# Patient Record
Sex: Male | Born: 1960 | Race: White | Hispanic: No | Marital: Single | Smoking: Former smoker
Health system: Southern US, Community
[De-identification: ages and names within clinical notes are randomized; demographics above are authoritative.]

## PROBLEM LIST (undated history)

## (undated) DIAGNOSIS — K589 Irritable bowel syndrome without diarrhea: Secondary | ICD-10-CM

## (undated) DIAGNOSIS — K5090 Crohn's disease, unspecified, without complications: Secondary | ICD-10-CM

## (undated) DIAGNOSIS — I10 Essential (primary) hypertension: Secondary | ICD-10-CM

## (undated) DIAGNOSIS — F329 Major depressive disorder, single episode, unspecified: Secondary | ICD-10-CM

## (undated) DIAGNOSIS — R32 Unspecified urinary incontinence: Secondary | ICD-10-CM

## (undated) DIAGNOSIS — F423 Hoarding disorder: Secondary | ICD-10-CM

## (undated) DIAGNOSIS — F32A Depression, unspecified: Secondary | ICD-10-CM

## (undated) DIAGNOSIS — T7840XA Allergy, unspecified, initial encounter: Secondary | ICD-10-CM

## (undated) DIAGNOSIS — F419 Anxiety disorder, unspecified: Secondary | ICD-10-CM

## (undated) DIAGNOSIS — I499 Cardiac arrhythmia, unspecified: Secondary | ICD-10-CM

## (undated) DIAGNOSIS — E785 Hyperlipidemia, unspecified: Secondary | ICD-10-CM

## (undated) DIAGNOSIS — K219 Gastro-esophageal reflux disease without esophagitis: Secondary | ICD-10-CM

## (undated) HISTORY — DX: Hoarding disorder: F42.3

## (undated) HISTORY — DX: Major depressive disorder, single episode, unspecified: F32.9

## (undated) HISTORY — DX: Gastro-esophageal reflux disease without esophagitis: K21.9

## (undated) HISTORY — DX: Irritable bowel syndrome, unspecified: K58.9

## (undated) HISTORY — DX: Anxiety disorder, unspecified: F41.9

## (undated) HISTORY — DX: Unspecified urinary incontinence: R32

## (undated) HISTORY — DX: Depression, unspecified: F32.A

## (undated) HISTORY — PX: POLYPECTOMY: SHX149

## (undated) HISTORY — DX: Crohn's disease, unspecified, without complications: K50.90

## (undated) HISTORY — DX: Essential (primary) hypertension: I10

## (undated) HISTORY — DX: Cardiac arrhythmia, unspecified: I49.9

## (undated) HISTORY — DX: Allergy, unspecified, initial encounter: T78.40XA

## (undated) HISTORY — DX: Hyperlipidemia, unspecified: E78.5

## (undated) HISTORY — PX: APPENDECTOMY: SHX54

## (undated) HISTORY — PX: UMBILICAL HERNIA REPAIR: SHX196

## (undated) HISTORY — PX: COLONOSCOPY: SHX174

---
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
## 1999-09-14 ENCOUNTER — Encounter: Payer: Self-pay | Admitting: Surgery

## 1999-09-14 ENCOUNTER — Ambulatory Visit (HOSPITAL_COMMUNITY): Admission: RE | Admit: 1999-09-14 | Discharge: 1999-09-14 | Payer: Self-pay | Admitting: Surgery

## 1999-09-14 ENCOUNTER — Encounter (INDEPENDENT_AMBULATORY_CARE_PROVIDER_SITE_OTHER): Payer: Self-pay | Admitting: Specialist

## 1999-09-14 ENCOUNTER — Observation Stay: Admission: EM | Admit: 1999-09-14 | Discharge: 1999-09-15 | Payer: Self-pay | Admitting: Emergency Medicine

## 2005-08-07 ENCOUNTER — Ambulatory Visit: Payer: Self-pay | Admitting: Family Medicine

## 2005-08-22 ENCOUNTER — Ambulatory Visit: Payer: Self-pay | Admitting: Family Medicine

## 2005-11-06 ENCOUNTER — Ambulatory Visit: Payer: Self-pay | Admitting: Family Medicine

## 2005-11-16 ENCOUNTER — Ambulatory Visit: Payer: Self-pay | Admitting: Gastroenterology

## 2005-11-26 ENCOUNTER — Ambulatory Visit: Payer: Self-pay | Admitting: Gastroenterology

## 2005-11-26 LAB — HM COLONOSCOPY: HM Colonoscopy: NEGATIVE

## 2006-01-18 ENCOUNTER — Ambulatory Visit: Payer: Self-pay | Admitting: Family Medicine

## 2006-02-13 ENCOUNTER — Ambulatory Visit: Payer: Self-pay | Admitting: Family Medicine

## 2006-03-04 ENCOUNTER — Ambulatory Visit: Payer: Self-pay | Admitting: Family Medicine

## 2006-03-29 ENCOUNTER — Ambulatory Visit: Payer: Self-pay | Admitting: Family Medicine

## 2006-05-22 ENCOUNTER — Ambulatory Visit: Payer: Self-pay | Admitting: Family Medicine

## 2006-06-25 ENCOUNTER — Ambulatory Visit: Payer: Self-pay | Admitting: Family Medicine

## 2006-07-01 ENCOUNTER — Ambulatory Visit: Payer: Self-pay | Admitting: Family Medicine

## 2006-07-31 ENCOUNTER — Ambulatory Visit: Payer: Self-pay | Admitting: Family Medicine

## 2006-08-13 ENCOUNTER — Ambulatory Visit: Payer: Self-pay | Admitting: Family Medicine

## 2006-10-15 ENCOUNTER — Ambulatory Visit: Payer: Self-pay | Admitting: Family Medicine

## 2006-12-03 ENCOUNTER — Ambulatory Visit: Payer: Self-pay | Admitting: Family Medicine

## 2006-12-18 ENCOUNTER — Ambulatory Visit: Payer: Self-pay | Admitting: Family Medicine

## 2007-06-24 ENCOUNTER — Telehealth: Payer: Self-pay | Admitting: Gastroenterology

## 2007-06-24 ENCOUNTER — Ambulatory Visit: Payer: Self-pay | Admitting: Family Medicine

## 2007-08-11 ENCOUNTER — Ambulatory Visit: Payer: Self-pay | Admitting: Gastroenterology

## 2007-08-25 ENCOUNTER — Ambulatory Visit: Payer: Self-pay | Admitting: Gastroenterology

## 2007-08-26 ENCOUNTER — Encounter: Payer: Self-pay | Admitting: Gastroenterology

## 2007-09-22 DIAGNOSIS — Z860100 Personal history of colon polyps, unspecified: Secondary | ICD-10-CM | POA: Insufficient documentation

## 2007-09-22 DIAGNOSIS — F411 Generalized anxiety disorder: Secondary | ICD-10-CM | POA: Insufficient documentation

## 2007-09-22 DIAGNOSIS — F329 Major depressive disorder, single episode, unspecified: Secondary | ICD-10-CM | POA: Insufficient documentation

## 2007-09-22 DIAGNOSIS — Z8719 Personal history of other diseases of the digestive system: Secondary | ICD-10-CM | POA: Insufficient documentation

## 2007-09-22 DIAGNOSIS — Z8659 Personal history of other mental and behavioral disorders: Secondary | ICD-10-CM | POA: Insufficient documentation

## 2007-09-22 DIAGNOSIS — K5080 Crohn's disease of both small and large intestine without complications: Secondary | ICD-10-CM | POA: Insufficient documentation

## 2007-09-23 ENCOUNTER — Ambulatory Visit: Payer: Self-pay | Admitting: Gastroenterology

## 2007-09-23 ENCOUNTER — Telehealth: Payer: Self-pay | Admitting: Gastroenterology

## 2007-09-23 DIAGNOSIS — R141 Gas pain: Secondary | ICD-10-CM | POA: Insufficient documentation

## 2007-09-23 DIAGNOSIS — R142 Eructation: Secondary | ICD-10-CM

## 2007-09-23 DIAGNOSIS — R143 Flatulence: Secondary | ICD-10-CM

## 2007-10-01 ENCOUNTER — Ambulatory Visit: Payer: Self-pay | Admitting: Family Medicine

## 2007-10-28 ENCOUNTER — Ambulatory Visit: Payer: Self-pay | Admitting: Gastroenterology

## 2007-10-28 LAB — CONVERTED CEMR LAB: Tissue Transglutaminase Ab, IgA: 0.4 units (ref <7)

## 2007-11-25 ENCOUNTER — Ambulatory Visit: Payer: Self-pay | Admitting: Gastroenterology

## 2007-12-02 ENCOUNTER — Telehealth: Payer: Self-pay | Admitting: Gastroenterology

## 2007-12-02 ENCOUNTER — Ambulatory Visit: Payer: Self-pay | Admitting: Cardiology

## 2007-12-09 ENCOUNTER — Ambulatory Visit: Payer: Self-pay | Admitting: Family Medicine

## 2007-12-15 DIAGNOSIS — K589 Irritable bowel syndrome without diarrhea: Secondary | ICD-10-CM | POA: Insufficient documentation

## 2008-01-21 ENCOUNTER — Telehealth: Payer: Self-pay | Admitting: Gastroenterology

## 2008-02-09 ENCOUNTER — Encounter: Payer: Self-pay | Admitting: Gastroenterology

## 2008-02-27 ENCOUNTER — Ambulatory Visit: Payer: Self-pay | Admitting: Family Medicine

## 2008-04-19 ENCOUNTER — Ambulatory Visit: Payer: Self-pay | Admitting: Family Medicine

## 2008-10-25 ENCOUNTER — Ambulatory Visit: Payer: Self-pay | Admitting: Family Medicine

## 2008-11-23 ENCOUNTER — Ambulatory Visit: Payer: Self-pay | Admitting: Family Medicine

## 2009-01-05 ENCOUNTER — Ambulatory Visit: Payer: Self-pay | Admitting: Family Medicine

## 2009-04-06 ENCOUNTER — Ambulatory Visit: Payer: Self-pay | Admitting: Family Medicine

## 2009-05-04 ENCOUNTER — Ambulatory Visit: Payer: Self-pay | Admitting: Family Medicine

## 2009-09-21 ENCOUNTER — Encounter: Admission: RE | Admit: 2009-09-21 | Discharge: 2009-09-21 | Payer: Self-pay | Admitting: Family Medicine

## 2009-09-21 ENCOUNTER — Ambulatory Visit: Payer: Self-pay | Admitting: Family Medicine

## 2009-09-22 ENCOUNTER — Emergency Department (HOSPITAL_COMMUNITY): Admission: EM | Admit: 2009-09-22 | Discharge: 2009-09-22 | Payer: Self-pay | Admitting: Emergency Medicine

## 2009-09-28 ENCOUNTER — Telehealth (INDEPENDENT_AMBULATORY_CARE_PROVIDER_SITE_OTHER): Payer: Self-pay | Admitting: *Deleted

## 2009-09-29 ENCOUNTER — Encounter: Payer: Self-pay | Admitting: Cardiology

## 2009-09-29 ENCOUNTER — Encounter (HOSPITAL_COMMUNITY): Admission: RE | Admit: 2009-09-29 | Discharge: 2009-10-07 | Payer: Self-pay | Admitting: Family Medicine

## 2009-09-29 ENCOUNTER — Ambulatory Visit: Payer: Self-pay

## 2009-09-29 ENCOUNTER — Encounter: Payer: Self-pay | Admitting: Internal Medicine

## 2009-09-29 ENCOUNTER — Ambulatory Visit: Payer: Self-pay | Admitting: Cardiology

## 2009-10-12 ENCOUNTER — Ambulatory Visit: Payer: Self-pay | Admitting: Family Medicine

## 2010-01-09 ENCOUNTER — Ambulatory Visit
Admission: RE | Admit: 2010-01-09 | Discharge: 2010-01-09 | Payer: Self-pay | Source: Home / Self Care | Attending: Family Medicine | Admitting: Family Medicine

## 2010-01-29 LAB — CONVERTED CEMR LAB
ALT: 30 units/L (ref 0–53)
AST: 25 units/L (ref 0–37)
Albumin: 4.2 g/dL (ref 3.5–5.2)
Alkaline Phosphatase: 52 units/L (ref 39–117)
BUN: 11 mg/dL (ref 6–23)
Basophils Absolute: 0.1 10*3/uL (ref 0.0–0.1)
Basophils Relative: 1.2 % (ref 0.0–3.0)
Bilirubin, Direct: 0.2 mg/dL (ref 0.0–0.3)
CO2: 33 meq/L — ABNORMAL HIGH (ref 19–32)
CRP, High Sensitivity: <1 — ABNORMAL LOW (ref 0.00–5.00)
Calcium: 9.1 mg/dL (ref 8.4–10.5)
Chloride: 104 meq/L (ref 96–112)
Creatinine, Ser: 1.1 mg/dL (ref 0.4–1.5)
Eosinophils Absolute: 0.1 10*3/uL (ref 0.0–0.7)
Eosinophils Relative: 1.1 % (ref 0.0–5.0)
Ferritin: 77.7 ng/mL (ref 22.0–322.0)
Folate: 19.6 ng/mL
GFR calc Af Amer: 93 mL/min
GFR calc non Af Amer: 77 mL/min
Glucose, Bld: 94 mg/dL (ref 70–99)
HCT: 46 % (ref 39.0–52.0)
Hemoglobin: 15.7 g/dL (ref 13.0–17.0)
Iron: 127 ug/dL (ref 42–165)
Lymphocytes Relative: 21.6 % (ref 12.0–46.0)
MCHC: 34.1 g/dL (ref 30.0–36.0)
MCV: 93.8 fL (ref 78.0–100.0)
Monocytes Absolute: 0.6 10*3/uL (ref 0.1–1.0)
Monocytes Relative: 6.5 % (ref 3.0–12.0)
Neutro Abs: 6.6 10*3/uL (ref 1.4–7.7)
Neutrophils Relative %: 69.6 % (ref 43.0–77.0)
Platelets: 341 10*3/uL (ref 150–400)
Potassium: 4.7 meq/L (ref 3.5–5.1)
RBC: 4.9 M/uL (ref 4.22–5.81)
RDW: 12.8 % (ref 11.5–14.6)
Saturation Ratios: 34.2 % (ref 20.0–50.0)
Sed Rate: 5 mm/hr (ref 0–16)
Sodium: 140 meq/L (ref 135–145)
TSH: 4.49 microintl units/mL (ref 0.35–5.50)
Total Bilirubin: 0.9 mg/dL (ref 0.3–1.2)
Total Protein: 7.3 g/dL (ref 6.0–8.3)
Transferrin: 265 mg/dL (ref >=212.0)
Vitamin B-12: 533 pg/mL (ref 211–911)
WBC: 9.4 10*3/uL (ref 4.5–10.5)

## 2010-01-31 NOTE — Progress Notes (Signed)
Summary: Nuclear pre procedure  Phone Note Outgoing Call Call back at [facility] Phone [phone]   Call placed by: Valetta Fuller, CMA,  September 28, 2009 5:02 PM Call placed to: Patient Summary of Call: Reviewed information on [employer] Information Sheet (see scanned document for further details).  Zachary [name] spoke with patient.      Nuclear Med Background Indications for Stress Test: Evaluation for Ischemia, Abnormal EKG     Symptoms: Chest Pressure, SOB    Nuclear Pre-Procedure Height (in): 75

## 2010-01-31 NOTE — Assessment & Plan Note (Signed)
Summary: Cardiology Nuclear Testing  Nuclear Med Background Indications for Stress Test: Evaluation for Ischemia, Abnormal EKG     Symptoms: Chest Pressure, SOB    Nuclear Pre-Procedure Caffeine/Decaff Intake: None NPO After: 7:30 PM Lungs: clear IV 0.9% NS with Angio Cath: 22g     IV Site: R Antecubital IV Started by: Crissie Figures, RN Chest Size (in) 34     Height (in): 75 Weight (lb): 234 BMI: 29.35  Nuclear Med Study 1 or 2 day study:  1 day     Stress Test Type:  Stress Reading MD:  Kirk Ruths, MD     Referring MD:  J.LaLonde Resting Radionuclide:  Technetium 81mTetrofosmin     Resting Radionuclide Dose:  11 mCi  Stress Radionuclide:  Technetium 955metrofosmin     Stress Radionuclide Dose:  33 mCi   Stress Protocol Exercise Time (min):  8:00 min     Max HR:  155 bpm     Predicted Max HR:  173 bpm       Percent Max HR:  89.60 %     METS: 10.4    Stress Test Technologist:  SaPerrin MalteseEMT-P     Nuclear Technologist:  StCharlton AmorCNMT  Rest Procedure  Myocardial perfusion imaging was performed at rest 45 minutes following the intravenous administration of Technetium 9976mtrofosmin.  Stress Procedure  The patient exercised for 8:00. The patient stopped due to fatigue and denied any chest pain.  There were no significant ST-T wave changes.  Technetium 6m19mrofosmin was injected at peak exercise and myocardial perfusion imaging was performed after a brief delay.  QPS Raw Data Images:  Acquisition technically good; normal left ventricular size. Stress Images:  Normal homogeneous uptake in all areas of the myocardium. Rest Images:  Normal homogeneous uptake in all areas of the myocardium. Subtraction (SDS):  No evidence of ischemia. Transient Ischemic Dilatation:  1.03  (Normal <1.22)  Lung/Heart Ratio:  .35  (Normal <0.45)  Quantitative Gated Spect Images QGS EDV:  114 ml QGS ESV:  44 ml QGS EF:  62 % QGS cine images:  Normal wall  motion.   Overall Impression  Exercise Capacity: Good exercise capacity. BP Response: Normal blood pressure response. Clinical Symptoms: No chest pain ECG Impression: No significant ST segment change suggestive of ischemia. Overall Impression: Normal stress nuclear study with no ischemia or infarction.

## 2010-03-16 LAB — DIFFERENTIAL
Basophils Absolute: 0 10*3/uL (ref 0.0–0.1)
Eosinophils Relative: 4 % (ref 0–5)
Lymphocytes Relative: 21 % (ref 12–46)
Lymphs Abs: 1.9 10*3/uL (ref 0.7–4.0)
Neutro Abs: 5.9 10*3/uL (ref 1.7–7.7)
Neutrophils Relative %: 67 % (ref 43–77)

## 2010-03-16 LAB — CBC
MCV: 91.7 fL (ref 78.0–100.0)
Platelets: 307 10*3/uL (ref 150–400)
RBC: 4.83 MIL/uL (ref 4.22–5.81)
RDW: 12.8 % (ref 11.5–15.5)
WBC: 8.8 10*3/uL (ref 4.0–10.5)

## 2010-03-16 LAB — BASIC METABOLIC PANEL
Chloride: 105 mEq/L (ref 96–112)
Creatinine, Ser: 1.08 mg/dL (ref 0.4–1.5)
GFR calc Af Amer: >60 mL/min (ref >60)
GFR calc non Af Amer: >60 mL/min (ref >60)

## 2010-03-16 LAB — POCT CARDIAC MARKERS
CKMB, poc: 1.6 ng/mL (ref 1.0–8.0)
Troponin i, poc: <0.05 ng/mL (ref 0.00–0.09)

## 2010-04-25 ENCOUNTER — Encounter (INDEPENDENT_AMBULATORY_CARE_PROVIDER_SITE_OTHER): Payer: 59 | Admitting: Family Medicine

## 2010-04-25 DIAGNOSIS — F329 Major depressive disorder, single episode, unspecified: Secondary | ICD-10-CM

## 2010-04-25 DIAGNOSIS — F3289 Other specified depressive episodes: Secondary | ICD-10-CM

## 2010-04-25 DIAGNOSIS — K319 Disease of stomach and duodenum, unspecified: Secondary | ICD-10-CM

## 2010-04-25 DIAGNOSIS — Z0000 Encounter for general adult medical examination without abnormal findings: Secondary | ICD-10-CM

## 2010-04-25 DIAGNOSIS — J309 Allergic rhinitis, unspecified: Secondary | ICD-10-CM

## 2010-05-19 NOTE — Op Note (Signed)
[facility]  Patient:    CASPER, PAGLIUCA                   MRN: [id number] Proc. Date: 09/14/99 Adm. Date:  [id number] Disc. Date: [id number] Attending:  Coralie Keens A                           Operative Report  PREOPERATIVE DIAGNOSES:  Acute appendicitis.  POSTOPERATIVE DIAGNOSES:  Acute appendicitis.  PROCEDURE:  Laparoscopic appendectomy.  SURGEON:  Coralie Keens, M.D.  ANESTHESIA:  General endotracheal anesthesia and 0.25% Marcaine plain.  ESTIMATED BLOOD LOSS:  Minimal.  DESCRIPTION OF PROCEDURE:  The patient was brought to the operating room and identified as Zachery Conch. He was placed supine on the operating table where general anesthesia was induced. His abdomen was then prepped and draped in the usual sterile fashion. Using a #15 blade, a small transverse incision was made just below the umbilicus. The incision was carried down to the fascia. The fascia was then opened with a scalpel. A hemostat was then used to pass into the peritoneal cavity. Next, a #0 Vicryl pursestring suture was placed around the fascial opening. The Hasson port was placed through the opening and insufflation of the abdomen was begun.  Next, a 12 mm port was placed in the patients low midline. This was done under direct vision. The appendix was then identified and found to be mildly inflamed. Next, a separate 5 mm port was placed in the patients right upper quadrant. The appendix was then grasped and elevated. The mesoappendix was then dissected away from the appendix. The endovascular stapler was then used to take down the mesoappendix in 2 separate bites. The base of the appendix was then identified and the EndoGIA was used to transect this as well. The appendix was then removed through the umbilical incision. The port was then replaced. A small bleeding area on the staple line was controlled with regular endoscopic staples. The area was then  irrigated. Hemostasis appeared to be achieved. At this point, all ports were removed under direct vision and the abdomen was deflated. A #0 Vicryl pursestring suture at the umbilicus was then fastened in placed. The separate port site in the lower midline was also closed with a #0 Vicryl figure-of-eight suture as well. All skin incisions were then anesthetized with 0.25% Marcaine and closed with 4-0 monocryl sutures. Steri-Strips, gauze and Tegaderm were then applied. The patient tolerated the procedure well. All sponge, needle and instrument counts were correct at the end of the procedure. The patient was then extubated in the operating room and taken in stable condition to the recovery room. DD:  09/14/99 TD:  09/16/99 Job: [id number]

## 2010-05-19 NOTE — Assessment & Plan Note (Signed)
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 Redmond School for evaluation of guaiac positive stool  determined on digital rectal exam.   Zachary [name] has had some hemorrhoids, occasional bright red blood with  wiping.  He denies abdominal pain otherwise or irregular bowel habits.  He  does have rather marked increased flatulence.  He denies upper GI or  hepatobiliary complaints.  He apparently was seen by myself many years ago  when he was a teenager and he was told he had irritable bowel syndrome.  These records are not available for review.  He certainly has had no  anorexia, weight loss, growth disturbance, or systemic complaints such as  fevers, chills, skin rash, joint pains, or oral stomatitis.  His appetite is  good and his weight is stable.   He recently saw Dr. Redmond School and had a positive stool Hemoccult.  Lab data  showed a normal hemoglobin of 16.4 and a normal metabolic profile except for  borderline low blood glucose of 68 mg percent.  Lipid panel was normal.   The patient apparently has never had barium studies and it is unclear  whether or not he had a colonoscopy many years ago.   PAST MEDICAL HISTORY:  Remarkable for anxiety and depression in his teenage  years, but not currently.   FAMILY HISTORY:  Remarkable for prostate cancer in his father and breast  cancer in his maternal grandmother.  There are no known gastrointestinal  problems in his family.   The patient does not smoke or abuse ethanol.   REVIEW OF SYSTEMS:  Entirely noncontributory.  He apparently did have viral  hepatitis in 1981, has had no known chronic liver disease, denies  hepatobiliary complaints.  He also  denies current cardiovascular, pulmonary,  or neurological or psychiatric difficulties at this time.   He is a healthy-appearing white male, appearing his stated age in no acute  distress.  He is 6 feet 3 inches tall and weighs 203 pounds.  Blood pressure is 128/90  and pulse was 72 and regular.  I could not appreciate stigmata of chronic liver disease.  His chest was entirely clear to percussion and auscultation.  There were no murmurs, gallops or rubs noted.  He appeared to be in a  regular rhythm.  I could not appreciate hepatosplenomegaly, abdominal masses or tenderness.  Peripheral extremities were unremarkable.  RECTAL:  Exam was deferred at this time.   ASSESSMENT:  Zachary [name] has a positive hemoccult card without anemia and I  suspect he has an underlying colonic polyp.  Certainly nothing in his exam  or history to suggest chronic inflammatory bowel disease.   RECOMMENDATIONS:  I have gone ahead and set Zachary [name] up for colonoscopy  at his convenience.  The risk and benefits of this procedure have been  explained in  detail, he agreed to proceed as planned.     Loralee Pacas. Sharlett Iles, MD, [facility] Ore, [place]  Electronically Signed    DRP/MedQ  DD: 11/16/2005  DT: 11/16/2005  Job #: [id number]   cc:   Jill Alexanders, M.D.

## 2010-06-26 ENCOUNTER — Encounter: Payer: Self-pay | Admitting: Family Medicine

## 2010-06-26 DIAGNOSIS — Z8619 Personal history of other infectious and parasitic diseases: Secondary | ICD-10-CM | POA: Insufficient documentation

## 2010-06-26 DIAGNOSIS — J302 Other seasonal allergic rhinitis: Secondary | ICD-10-CM

## 2010-07-04 ENCOUNTER — Encounter: Payer: Self-pay | Admitting: Medical

## 2010-07-04 ENCOUNTER — Ambulatory Visit (INDEPENDENT_AMBULATORY_CARE_PROVIDER_SITE_OTHER): Payer: 59 | Admitting: Medical

## 2010-07-04 VITALS — BP 114/82 | HR 75 | Temp 97.7°F | Ht 75.0 in | Wt 241.0 lb

## 2010-07-04 DIAGNOSIS — R6882 Decreased libido: Secondary | ICD-10-CM

## 2010-07-04 DIAGNOSIS — R5383 Other fatigue: Secondary | ICD-10-CM

## 2010-07-04 DIAGNOSIS — M25572 Pain in left ankle and joints of left foot: Secondary | ICD-10-CM

## 2010-07-04 DIAGNOSIS — M25579 Pain in unspecified ankle and joints of unspecified foot: Secondary | ICD-10-CM

## 2010-07-04 NOTE — Progress Notes (Signed)
  Subjective:   HPI Here today for mult c/o.    He has concerns about left ankle and foot problems.  Currently gets ankle pain that radiates to the rest of his foot.  At times the pain radiates to calve.  Worsened by being on feet all day.  Denies problems with the right foot.  Relieved by nothing, "it just goes away."   Pain usually lasts different amounts of time.  Denies swelling, redness, or warmth.  Uses nothing to relieve the pain other than rest.  Pain is dull and constant.  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   Denies exercise other than through work.    He also has concerns about having his testosterone checked. He notes for months now he states fatigue, has no energy, and has no sex drive. He currently is single. He was just here for his physical in April 2012.  No other aggravating or relieving factors.  No other c/o.  The following portions of the patient's history were reviewed and updated as appropriate: allergies, current medications, past family history, past medical history, past social history, past surgical history and problem list.  Past Medical History  Diagnosis Date  . Allergy   . Depression   . Crohn disease   . Herpes simplex     Review of Systems Gen.: No fever, chills, weight changes Skin: No rash, redness, bruising Cardiac: No chest pain, palpitations, edema Lungs: No shortness of breath, wheezing, dyspnea on exertion GU: No lesions, masses, discharge, pain, dysuria, urinary stream changes Neuro: No numbness, tingling, weakness, sleep changes Psych: No worsening depression or anxiety     Objective:   Physical Exam  General appearance: alert, no distress, WD/WN, white male, overweight Musculoskeletal: Left medial ankle with generalized tenderness along the deltoid ligament, mild pain on the medial ankle when he walks, otherwise malleoli are nontender, no swelling bilaterally, range of motion normal, and rest and lower extremities non-tender, no  deformity, normal range of motion.  When he ambulates his arch flattens bilaterally. Pulses: 2+, normal cap refill Extremities: No edema Neuro: Normal strength and sensation of lower extremities and feet    Assessment :    Encounter Diagnoses  Name Primary?  Marland Kitchen Ankle pain, left Yes  . Libido, decreased   . Fatigue      Plan:    Ankle pain-after watching him walk and looking in his shoes, his arch flattens out completely with walking, he has some over pronation, and issues are certainly wore out. 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 I recommend he go and have a shoe store fit him for a proper supportive stability shoe.  Consider [employer] or [employer] for their expertise here locally.  If this problem continues beyond a month, we can consider an x-ray or referral.  Libido, fatigue - we will check a testosterone today. We discussed possible therapies if testosterone is low. We also discussed prostate cancer screening.

## 2010-07-06 ENCOUNTER — Telehealth: Payer: Self-pay | Admitting: *Deleted

## 2010-07-06 NOTE — Telephone Encounter (Addendum)
Message copied by Gracy Racer on Thu Jul 06, 2010 11:29 AM ------      Message from: Glade Lloyd, DAVID S      Created: Thu Jul 06, 2010  7:39 AM       His testosterone is within normal range.  In general I recommend he drink plenty of water daily, eat low fat, healthy diet with plenty of fruits, berries, vegetables, and less fatty/fast food items.  Also, ask him to take a daily multivitamin, and try a B complex vitamin over the counter that contains B12 and other B vitamins to help with energy.  Try and lose some weight, as weight loss usually improves sex drive and over all healthy feeling.   Pt notified of lab results. Advised pt to drink plenty of fluids, eat healthy with low fat and to take multivitamin with B complex.  CM, LPN

## 2010-08-17 ENCOUNTER — Encounter: Payer: Self-pay | Admitting: Family Medicine

## 2010-08-17 ENCOUNTER — Ambulatory Visit (INDEPENDENT_AMBULATORY_CARE_PROVIDER_SITE_OTHER): Payer: 59 | Admitting: Family Medicine

## 2010-08-17 VITALS — BP 110/80 | HR 84 | Temp 98.2°F | Wt 240.0 lb

## 2010-08-17 DIAGNOSIS — K589 Irritable bowel syndrome without diarrhea: Secondary | ICD-10-CM

## 2010-08-17 DIAGNOSIS — J029 Acute pharyngitis, unspecified: Secondary | ICD-10-CM

## 2010-08-17 NOTE — Patient Instructions (Signed)
Use a bulk laxative of your choice for the next several weeks and then call me

## 2010-08-17 NOTE — Progress Notes (Signed)
  Subjective:    Patient ID: Zachary [name], male    DOB: [birth year], 51 y.o.   MRN: [id number]  HPI Over the last several months he has noted difficulty with urgency to have a BM. If he does not get to the bathroom within several minutes, he will soil himself. He has a previous history of difficulty with flatulence. He has been seen by GI here and in [facility]. No change in eating habits. Her meds were readjusted and he is now on 3 Luvox per day per however this occurred after the GI symptoms. His stool pattern can be from hard stool to loose and watery in one day. He has not discussed these symptoms with anybody prior to today He also complains of a five-day history of sore throat and earache but no fever, chills, cough or congestion. He is starting to feel better. Review of Systems     Objective:   Physical Exam alert and in no distress. Tympanic membranes and canals are normal. Throat is clear. Tonsils are normal. Neck is supple without adenopathy or thyromegaly. Cardiac exam shows a regular sinus rhythm without murmurs or gallops. Lungs are clear to auscultation., Exam shows decreased bowel sounds without masses or tenderness        Assessment & Plan:  IBS. Sore throat Conservative care for the sore throat. Recommend bulk laxative of choice regularly for the next 10 days and call me.

## 2010-11-09 ENCOUNTER — Encounter: Payer: Self-pay | Admitting: Family Medicine

## 2010-11-09 ENCOUNTER — Ambulatory Visit (INDEPENDENT_AMBULATORY_CARE_PROVIDER_SITE_OTHER): Payer: 59 | Admitting: Family Medicine

## 2010-11-09 VITALS — BP 130/86 | HR 70 | Wt 240.0 lb

## 2010-11-09 DIAGNOSIS — Z719 Counseling, unspecified: Secondary | ICD-10-CM

## 2010-11-09 DIAGNOSIS — IMO0001 Reserved for inherently not codable concepts without codable children: Secondary | ICD-10-CM

## 2010-11-09 DIAGNOSIS — D229 Melanocytic nevi, unspecified: Secondary | ICD-10-CM

## 2010-11-09 DIAGNOSIS — D239 Other benign neoplasm of skin, unspecified: Secondary | ICD-10-CM

## 2010-11-09 NOTE — Progress Notes (Signed)
  Subjective:    Patient ID: Zachary [name], male    DOB: 04-14-1960, 50 y.o.   MRN: [id number]  HPI He is here to discuss a lesion present in the left torso. It has diminished in size from yesterday when he made the appointment. He also has concerns over weight gain. He has cut back on his calorie consumption but admits to not being as physically active as he has been in the past.   Review of Systems     Objective:   Physical Exam Alert and in no distress. Exam of the left torso shows a healing lesion that was probably a mole or small hair follicle.       Assessment & Plan:   1. Mole (skin)   2. Encounter for consultation    I discussed weight loss with him in regard to his eating habits and his physical activities. Encouraged him to become more physically active. He stopped going to gym do to what he considers his excessive flatulence and social ramifications of that. Encouraged him to become more physically active on his own.

## 2011-02-28 ENCOUNTER — Encounter: Payer: Self-pay | Admitting: Gastroenterology

## 2011-03-16 ENCOUNTER — Encounter: Payer: Self-pay | Admitting: Family Medicine

## 2011-03-16 ENCOUNTER — Ambulatory Visit (INDEPENDENT_AMBULATORY_CARE_PROVIDER_SITE_OTHER): Payer: 59 | Admitting: Family Medicine

## 2011-03-16 VITALS — BP 130/80 | HR 64 | Wt 239.0 lb

## 2011-03-16 DIAGNOSIS — K219 Gastro-esophageal reflux disease without esophagitis: Secondary | ICD-10-CM

## 2011-03-16 DIAGNOSIS — K589 Irritable bowel syndrome without diarrhea: Secondary | ICD-10-CM

## 2011-03-16 MED ORDER — HYOSCYAMINE SULFATE 0.125 MG SL SUBL: 0.1250 mg | SUBLINGUAL_TABLET | SUBLINGUAL | Status: AC | PRN

## 2011-03-16 MED ORDER — ESOMEPRAZOLE MAGNESIUM 40 MG PO CPDR: 40.0000 mg | DELAYED_RELEASE_CAPSULE | Freq: Every day | ORAL | Status: DC

## 2011-03-16 NOTE — Patient Instructions (Signed)
Diet for GERD or PUD Nutrition therapy can help ease the discomfort of gastroesophageal reflux disease (GERD) and peptic ulcer disease (PUD).  HOME CARE INSTRUCTIONS   Eat your meals slowly, in a relaxed setting.   Eat 5 to 6 small meals per day.   If a food causes distress, stop eating it for a period of time.  FOODS TO AVOID  Coffee, regular or decaffeinated.   Cola beverages, regular or low calorie.   Tea, regular or decaffeinated.   Pepper.   Cocoa.   High fat foods, including meats.   Butter, margarine, hydrogenated oil (trans fats).   Peppermint or spearmint (if you have GERD).   Fruits and vegetables if not tolerated.   Alcohol.   Nicotine (smoking or chewing). This is one of the most potent stimulants to acid production in the gastrointestinal tract.   Any food that seems to aggravate your condition.  If you have questions regarding your diet, ask your caregiver or a registered dietitian. TIPS  Lying flat may make symptoms worse. Keep the head of your bed raised 6 to 9 inches (15 to 23 cm) by using a foam wedge or blocks under the legs of the bed.   Do not lay down until 3 hours after eating a meal.   Daily physical activity may help reduce symptoms.  MAKE SURE YOU:   Understand these instructions.   Will watch your condition.   Will get help right away if you are not doing well or get worse.  Document Released: 12/18/2004 Document Revised: 12/07/2010 Document Reviewed: 11/03/2010 [facility] Patient Information 2012 [place].   LLQ pain and lower abdominal pain--likely contributed by constipation and gas, and some component of pain is related to irritable bowel syndrome.  Bland foods, avoid dairy.  Trial of Simethicone (gas-X).  High fiber diet and drink at least 8 glasses of water daily to help keep bowels moving regularly.  Trial of anti-spasmodic, to use when having acute onset of abdominal pain.  Reflux-- cut back on tomatoes and acidic  foods, as well as chocolate.  Trial of Nexium regularly for a few weeks, then can try stopping.  If recurrent reflux symptoms, can use OTC Zantac, Pepcid or Prilosec as needed.

## 2011-03-16 NOTE — Progress Notes (Signed)
Patient presents with complaint of "gas pain".  Problems began about 2 months ago.  Pain is at LLQ, and suprapubic, comes and goes, not related to meals.  Doesn't recall that passing gas or having bowel movements eases the pain at all.  Gets the pain about 3-4 days/week, and he "just lives it out".  Pain lasts for 1-2 hours, then self resolves.  Hasn't tried anything for the pain.  Bowels are unchanged--has constipation and diarrhea both, sometimes in the same day.  Moves his bowels daily.  He has a h/o IBS, has never taken meds for it.  He reportedly was told that he does NOT have Crohns disease.  Last colonoscopy was about 2 years ago.  Apparently he was told he didn't have Crohn's after a blood test, a few months after the colonoscopy.    Also having heartburn for the last 2 months.  Denies any dietary changes.  Drinks a lot of Ensure, chocolate.  Drinks a lot of ginger ale.  Denies any caffeine. +tomato sauces.  Heartburn seems to occur mostly at work, and he thinks is stress-related.  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  He feels the acid rising up in his throat.  Denies significant belching, nausea or vomiting (rare nausea).  Hasn't taken any meds for it.  Doesn't recall if symptoms occur after eating, symptoms last less than an hour.  Denies dysphagia or chest pain.  Past Medical History  Diagnosis Date  . Allergy   . Depression   . Crohn disease   . Herpes simplex     Past Surgical History  Procedure Date  . Twisted testicles 1970    [place]     History   Social History  . Marital Status: Single    Spouse Name: N/A    Number of Children: N/A  . Years of Education: N/A   Occupational History  . Not on file.   Social History Main Topics  . Smoking status: Never Smoker   . Smokeless tobacco: Never Used  . Alcohol Use: No  . Drug Use: No  . Sexually Active: Not on file   Other Topics Concern  . Not on file   Social History Narrative  . No narrative on file    Family History    Problem Relation Age of Onset  . Arthritis Mother   . Hypertension Father   . Cancer Father 12    prostate cancer  . Cancer Maternal Aunt   . Cancer Maternal Uncle   . Cancer Maternal Grandmother   . Cancer Maternal Grandfather   . Cancer Paternal Grandmother   . Cancer Paternal Uncle     prostate cancer   Current Outpatient Prescriptions on File Prior to Visit  Medication Sig Dispense Refill  . lamoTRIgine (LAMICTAL) 200 MG tablet Take 200 mg by mouth 2 (two) times daily.       . methylphenidate (RITALIN) 20 MG tablet Take 20 mg by mouth 3 (three) times daily.       . Furosemide (LASIX PO) Take by mouth.         Allergies  Allergen Reactions  . Penicillins     REACTION: "it may kill me"  . Tetracyclines & Related    ROS:  Denies fevers, URI symptoms, cough, shortness of breath, chest pain, palpitations, skin rash, headaches, dizziness or other concerns.  See HPI  PHYSICAL EXAM: BP 130/80  Pulse 64  Wt 239 lb (108.41 kg) Well developed, mildly anxious-appearing male, in no distress  HEENT:  PERRL, EOMI, conjunctiva clear.  OP clear Neck: no lymphadenopathy, thyromegaly or mass Heart: regular rate and rhythm without murmur Lungs: clear bilaterally Abdomen: soft, nondistended.  Active bowel sounds.  Mild epigastric tenderness.  Diffuse mild tenderness across lower abdomen, more focal at LLQ.  Some firm linear thickening/mass felt in LLQ, tender on palpation, consistent with probable stool.  No rebound tenderness or guarding. Skin: no rash Neuro: alert and oriented.  Cranial nerves grossly intact. Normal gait  ASSESSMENT/PLAN: 1. IBS (irritable bowel syndrome)  hyoscyamine (LEVSIN SL) 0.125 MG SL tablet  2. GERD (gastroesophageal reflux disease)  esomeprazole (NEXIUM) 40 MG capsule   LLQ pain and lower abdominal pain--likely contributed by constipation and gas, and some component of pain is related to irritable bowel syndrome.  Bland foods, avoid dairy.  Trial of  Simethicone (gas-X).  High fiber diet and drink at least 8 glasses of water daily to help keep bowels moving regularly.  Trial of anti-spasmodic, to use when having acute onset of abdominal pain.  Reflux-- cut back on tomatoes and acidic foods, as well as chocolate.  Trial of Nexium regularly for a few weeks, then can try stopping.  If recurrent reflux symptoms, can use OTC Zantac, Pepcid or Prilosec as needed.   F/u 2 weeks if not resolved.  30 minute face to face visit, more than 1/2 spent in counseling

## 2011-04-06 ENCOUNTER — Ambulatory Visit (INDEPENDENT_AMBULATORY_CARE_PROVIDER_SITE_OTHER): Payer: 59 | Admitting: Medical

## 2011-04-06 ENCOUNTER — Encounter: Payer: Self-pay | Admitting: Medical

## 2011-04-06 DIAGNOSIS — IMO0002 Reserved for concepts with insufficient information to code with codable children: Secondary | ICD-10-CM

## 2011-04-06 DIAGNOSIS — S76319A Strain of muscle, fascia and tendon of the posterior muscle group at thigh level, unspecified thigh, initial encounter: Secondary | ICD-10-CM

## 2011-04-06 DIAGNOSIS — J309 Allergic rhinitis, unspecified: Secondary | ICD-10-CM

## 2011-04-06 NOTE — Patient Instructions (Signed)
Begin Zyrtec 10 mg OTC at bedtime daily for allergies  Begin Naprelan 761m, 1 daily for 2 days, along with heat, rest,   Then Naprelan 5074m 1 daily for 2 days, then call back Monday.

## 2011-04-06 NOTE — Progress Notes (Signed)
Subjective: Here with 2 c/o.  He notes few day hx/o sinus headache, clear running nasal drainage, itchy eyes, but no fever, no NVD, no abdominal pain, no sore throat or ear pain.  Coughing aggravates some pain behind left shoulder blade.  He reports 1 week hx/o buttock and leg pain.  Doesn't recall specific injury or trauma. He note pain behind left knee, pain in left buttock radiating down side and back of leg.  Denies back pain, numbness or tingling.   Pain is achy.  He is a Nurse, children's, on feet most of the day, does lots of walking.    Past Medical History  Diagnosis Date  . Allergy   . Depression   . Crohn disease   . Herpes simplex    ROS as noted in HPI  Objective: General appearance: alert, no distress, WD/WN HEENT: normocephalic, sclerae anicteric, TMs pearly, nares patent, clear discharge no erythema, pharynx normal Oral cavity: MMM, no lesions Neck: supple, no lymphadenopathy, no thyromegaly, no masses Heart: RRR, normal S1, S2, no murmurs Lungs: CTA bilaterally, no wheezes, rhonchi, or rales MSK: tender along left lateral biceps femoris and lateral knee at insertion, tender along lateral leg and buttock.  Mild pain with leg extension.  otherwise LE nontender, no joint tenderness, no swelling, normal ROM Neuro: normal LE strength and sensation and DTRs Pulses: 2+ symmetric throughout  Assessment and Plan :    Encounter Diagnoses  Name Primary?  . Allergic rhinitis Yes  . Hamstring strain    Allergic rhinitis - zyrtec 63m QHS  Hamstring strain - advised rest, gentle ROM and stretching, OTC NSAIDs, and if not improving in 1 week, call or return

## 2011-04-07 ENCOUNTER — Encounter: Payer: Self-pay | Admitting: Medical

## 2011-04-13 ENCOUNTER — Encounter: Payer: Self-pay | Admitting: Medical

## 2011-04-13 ENCOUNTER — Ambulatory Visit (INDEPENDENT_AMBULATORY_CARE_PROVIDER_SITE_OTHER): Payer: 59 | Admitting: Medical

## 2011-04-13 VITALS — BP 120/82 | HR 68 | Temp 97.7°F | Wt 239.0 lb

## 2011-04-13 DIAGNOSIS — M2141 Flat foot [pes planus] (acquired), right foot: Secondary | ICD-10-CM

## 2011-04-13 DIAGNOSIS — M2140 Flat foot [pes planus] (acquired), unspecified foot: Secondary | ICD-10-CM

## 2011-04-13 DIAGNOSIS — M79605 Pain in left leg: Secondary | ICD-10-CM

## 2011-04-13 DIAGNOSIS — M216X9 Other acquired deformities of unspecified foot: Secondary | ICD-10-CM

## 2011-04-13 DIAGNOSIS — M79609 Pain in unspecified limb: Secondary | ICD-10-CM

## 2011-04-13 NOTE — Progress Notes (Signed)
Subjective: Here for recheck on leg pain.  I saw him a week ago for same c/o.  He notes no improvement despite rest and NSAIDs.   He does note pain primarily left behind knee, left calf, worse with walking.  The upper leg is a little better since last visit.  He denies injury or trauma.  No prior similar, no hx/o DVT.  Pain in left leg only. He is a Nurse, children's, on feet most of the day, does lots of walking.    Past Medical History  Diagnosis Date  . Allergy   . Depression   . Crohn disease   . Herpes simplex    ROS as noted in HPI  Objective: General appearance: alert, no distress, WD/WN MSK: tender along left popliteal area and posterolateral calve.  Otherwise nontender.  Mild pain with leg extension.  otherwise LE nontender, no joint tenderness, no swelling, normal ROM Neuro: normal LE strength and sensation and DTRs Pulses: 2+ symmetric throughout  Assessment and Plan :    Encounter Diagnoses  Name Primary?  . Pes planus (flat feet) Yes  . Leg pain, left   . Pronation of feet     Advised he go to [employer] store to get fitted for good shoes with arch support and flat feet.  Discussed orthotic inserts OTC as well.  I suspect his footwear currently is helping contribute to his pain.   He will change footwear.  Consider ortho or podiatry referral if not working.  Call report in 2-3 wk.

## 2011-04-13 NOTE — Patient Instructions (Signed)
Your feet have flat arches and you over pronate.  Consider going to [employer] to be fitted for shoes that help with over pronation and poor arches.  If not improving in 3-4 weeks, then let me know.

## 2011-08-22 ENCOUNTER — Ambulatory Visit (INDEPENDENT_AMBULATORY_CARE_PROVIDER_SITE_OTHER): Payer: BC Managed Care – PPO | Admitting: Family Medicine

## 2011-08-22 ENCOUNTER — Encounter: Payer: Self-pay | Admitting: Family Medicine

## 2011-08-22 VITALS — BP 120/80 | HR 97 | Wt 248.0 lb

## 2011-08-22 DIAGNOSIS — M79609 Pain in unspecified limb: Secondary | ICD-10-CM

## 2011-08-22 DIAGNOSIS — Z1159 Encounter for screening for other viral diseases: Secondary | ICD-10-CM

## 2011-08-22 DIAGNOSIS — M79673 Pain in unspecified foot: Secondary | ICD-10-CM

## 2011-08-22 NOTE — Patient Instructions (Signed)
Use the arch supports. You may also been try heel cups however if you have continued trouble, I will refer you to podiatry

## 2011-08-22 NOTE — Progress Notes (Signed)
  Subjective:    Patient ID: Zachary [name], male    DOB: May 29, 1960, 51 y.o.   MRN: [id number]  HPI He has a two-week history of left lateral heel pain. No history of injury, new shoes, new surfaces, change in daily activities. No other bony pain. He would like to also be screened for hepatitis C since he is in the age range without a possibility.  Review of Systems     Objective:   Physical Exam Full motion of the ankle. His foot is slightly flat. Achilles tendon normal. No tenderness over calcaneal spur. Tender over the lateral tarsal area.       Assessment & Plan:   1. Foot pain    2. Special screening examination for other specified viral diseases  Hepatitis C antibody, reflex   recommend supportive care for the foot care including using arch supports. Also discussed the possibility of heel cups. If continued difficulty, refer to podiatry.

## 2011-08-23 LAB — HEPATITIS C ANTIBODY, REFLEX: HCV Ab: NEGATIVE

## 2011-08-24 ENCOUNTER — Encounter (HOSPITAL_COMMUNITY): Payer: Self-pay | Admitting: Emergency Medicine

## 2011-08-24 ENCOUNTER — Emergency Department (HOSPITAL_COMMUNITY)
Admission: EM | Admit: 2011-08-24 | Discharge: 2011-08-24 | Disposition: A | Discharge status: Home / Self Care | Payer: BC Managed Care – PPO | Attending: Emergency Medicine | Admitting: Emergency Medicine

## 2011-08-24 DIAGNOSIS — Z881 Allergy status to other antibiotic agents status: Secondary | ICD-10-CM | POA: Insufficient documentation

## 2011-08-24 DIAGNOSIS — Z880 Allergy status to penicillin: Secondary | ICD-10-CM | POA: Insufficient documentation

## 2011-08-24 DIAGNOSIS — T43591A Poisoning by other antipsychotics and neuroleptics, accidental (unintentional), initial encounter: Secondary | ICD-10-CM | POA: Insufficient documentation

## 2011-08-24 DIAGNOSIS — T43501A Poisoning by unspecified antipsychotics and neuroleptics, accidental (unintentional), initial encounter: Secondary | ICD-10-CM | POA: Insufficient documentation

## 2011-08-24 DIAGNOSIS — Z8249 Family history of ischemic heart disease and other diseases of the circulatory system: Secondary | ICD-10-CM | POA: Insufficient documentation

## 2011-08-24 DIAGNOSIS — Z809 Family history of malignant neoplasm, unspecified: Secondary | ICD-10-CM | POA: Insufficient documentation

## 2011-08-24 DIAGNOSIS — Z8042 Family history of malignant neoplasm of prostate: Secondary | ICD-10-CM | POA: Insufficient documentation

## 2011-08-24 DIAGNOSIS — Z8261 Family history of arthritis: Secondary | ICD-10-CM | POA: Insufficient documentation

## 2011-08-24 DIAGNOSIS — IMO0002 Reserved for concepts with insufficient information to code with codable children: Secondary | ICD-10-CM

## 2011-08-24 NOTE — ED Notes (Signed)
Pt states that he took 2 tablets of lamotrigine 230m, 2 tablets of quetiapine 2083m 3 tablets fluvoxamine 10025mPt took his night doses at 6pm and forgot that he was only suppose to take them at night now and took a dose this morning at 6am as well.

## 2011-08-24 NOTE — ED Provider Notes (Signed)
Patient had accidental ingestion of his daily medicines. Took his medications at 6 AM instead of 6 PM. Presently feels mildly lightheaded though improving with time on exam alert Glasgow Coma Score 15 gait normal moves all extremities well canners 2 through 12 grossly intact. Plan patient advised not to drive or operate machinery until feels back to baseline. No diagnostic testing needed  Zachary Dakin, MD 08/24/11 1229

## 2011-08-24 NOTE — ED Provider Notes (Signed)
Medical screening examination/treatment/procedure(s) were conducted as a shared visit with non-physician practitioner(s) and myself.  I personally evaluated the patient during the encounter  Orlie Dakin, MD 08/24/11 1715

## 2011-08-24 NOTE — ED Provider Notes (Signed)
History     CSN: [id number]  Arrival date & time 08/24/11  1003   First MD Initiated Contact with Patient 08/24/11 1212      Chief Complaint  Patient presents with  . Ingestion    (Consider location/radiation/quality/duration/timing/severity/associated sxs/prior treatment) HPI Comments: 50 y/o male presents s/p taking an extra dose of his medications this morning. He used to take all his meds in the morning, but was recently switched to taking them all at night. Out of habit he took them at 6am today, and also tok them at 6pm last night. He is on lamotrigine, fluvoxamine, and quetiapine. Quetiapine is new, and he is worried about this drug since he knows it is for sleep. Family member in room states patient was slurring his speech this morning. Feels a little unsteady on his feet. Patient admits to having dizziness and fatigue earlier today which subsided. Denies any nausea, vomiting, diarrhea, abdominal pain, chest pain, sob, headaches. States this was completely accidental and no attempt to hurt himself. 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   Patient is a 51 y.o. male presenting with Ingested Medication. The history is provided by the patient and a relative.  Ingestion Associated symptoms include fatigue (subsided). Pertinent negatives include no abdominal pain, chest pain, headaches, nausea or vomiting.    Past Medical History  Diagnosis Date  . Allergy   . Depression   . Crohn disease   . Herpes simplex     Past Surgical History  Procedure Date  . Twisted testicles 1970    [facility]     Family History  Problem Relation Age of Onset  . Arthritis Mother   . Hypertension Father   . Cancer Father 37    prostate cancer  . Cancer Maternal Aunt   . Cancer Maternal Uncle   . Cancer Maternal Grandmother   . Cancer Maternal Grandfather   . Cancer Paternal Grandmother   .  Cancer Paternal Uncle     prostate cancer    History  Substance Use Topics  . Smoking status: Never Smoker   . Smokeless tobacco: Never Used  . Alcohol Use: No      Review of Systems  Constitutional: Positive for fatigue (subsided).  Respiratory: Negative for shortness of breath.   Cardiovascular: Negative for chest pain.  Gastrointestinal: Negative for nausea, vomiting, abdominal pain and diarrhea.  Neurological: Positive for dizziness (subsided) and speech difficulty (subsided). Negative for headaches.  Psychiatric/Behavioral: Negative for suicidal ideas.    Allergies  Penicillins and Tetracyclines & related  Home Medications   Current Outpatient Rx  Name Route Sig Dispense Refill  . FLUVOXAMINE MALEATE 100 MG PO TABS Oral Take 100 mg by mouth 2 (two) times daily. 2 tabs am and 1 tab qhs    . LAMOTRIGINE 200 MG PO TABS Oral Take 400 mg by mouth at bedtime.     . ADULT MULTIVITAMIN W/MINERALS CH Oral Take 1 tablet by mouth daily.    . QUETIAPINE FUMARATE 200 MG PO TABS Oral Take 400 mg by mouth at bedtime.      BP 131/87  Pulse 63  Temp 97.5 F (36.4 C) (Oral)  Resp 16  SpO2 96%  Physical Exam  Constitutional: He is oriented to person, place, and time. He appears well-developed and well-nourished. No distress.  HENT:  Head: Normocephalic and atraumatic.  Eyes: Conjunctivae and  EOM are normal. Pupils are equal, round, and reactive to light.  Neck: Neck supple.  Cardiovascular: Normal rate, regular rhythm, normal heart sounds and intact distal pulses.   Pulmonary/Chest: Effort normal and breath sounds normal.  Abdominal: Soft. Normal appearance and bowel sounds are normal. There is tenderness (mild lower abdominal tenderness to palpation). There is no rigidity and no guarding.  Neurological: He is alert and oriented to person, place, and time. He has normal strength. Coordination normal.  Skin: Skin is warm and dry.  Psychiatric: He has a normal mood and affect.  His speech is normal and behavior is normal. Judgment and thought content normal. Cognition and memory are normal. He expresses no suicidal ideation.    ED Course  Procedures (including critical care time)  Labs Reviewed - No data to display No results found.   1. Drug ingestion       MDM  51 y/o male presenting after taking an extra dose of his medications this morning instead of at night. Patient is in NAD, has normal steady gait, and non toxic appearing. No GI or neuro symptoms present. Advised patient to avoid driving/operating heavy machinery until he returns to baseline and make sure his PCP is aware. No need for lab testing. Case discussed with Dr. Winfred Leeds who also evaluated patient and agrees with plan of care.        Illene Labrador, PA-C 08/24/11 1242

## 2011-08-24 NOTE — ED Notes (Signed)
Pt sts took PM meds this am accidentally because used to take those meds in am; meds for depression; pt c/o mild dizziness and dry mouth

## 2011-09-25 ENCOUNTER — Ambulatory Visit (INDEPENDENT_AMBULATORY_CARE_PROVIDER_SITE_OTHER): Payer: BC Managed Care – PPO | Admitting: Medical

## 2011-09-25 ENCOUNTER — Encounter: Payer: Self-pay | Admitting: Medical

## 2011-09-25 VITALS — BP 130/90 | HR 68 | Temp 97.4°F | Resp 16 | Wt 249.0 lb

## 2011-09-25 DIAGNOSIS — R05 Cough: Secondary | ICD-10-CM

## 2011-09-25 DIAGNOSIS — R059 Cough, unspecified: Secondary | ICD-10-CM

## 2011-09-25 DIAGNOSIS — Z23 Encounter for immunization: Secondary | ICD-10-CM

## 2011-09-25 DIAGNOSIS — R0982 Postnasal drip: Secondary | ICD-10-CM

## 2011-09-25 DIAGNOSIS — K5900 Constipation, unspecified: Secondary | ICD-10-CM

## 2011-09-25 NOTE — Progress Notes (Signed)
  Subjective:   HPI  Zachary [name] is a 51 y.o. male who presents for multiple c/o.  He notes 1 wk hx/o constipation.  Been having pellets with BM and few days without BM but had normal BM yesterday.  He recently had Seroquel added by psychiatrist last visit with them and was advised that this could cause constipation.  He was advised Miralax but hasn't started this.  No other abdominal pain, nausea, vomiting, or new dietary changes.    He reports a few week hx/o thick phlegm.  Coughs this up in the morning.  Denies feeling sick, no sore throat, no ear pain, no sinus pressure.  Denies runny nose, sneezing, itchy eyes.  No fever, no NVD.  No SOB, wheezing. He notes hx/o normal colonoscopy.  Wants flu shot today.  No other c/o.  The following portions of the patient's history were reviewed and updated as appropriate: allergies, current medications, past family history, past medical history, past social history, past surgical history and problem list.  Past Medical History  Diagnosis Date  . Allergy   . Depression   . Crohn disease   . Herpes simplex     Allergies  Allergen Reactions  . Penicillins     REACTION: "it may kill me"  . Tetracyclines & Related      Review of Systems ROS reviewed and was negative other than noted in HPI or above.    Objective:   Physical Exam  General appearance: alert, no distress, WD/WN HEENT: normocephalic, sclerae anicteric, TMs pearly, nares patent, no discharge or erythema, pharynx normal Oral cavity: MMM, no lesions Neck: supple, no lymphadenopathy, no thyromegaly, no masses Heart: RRR, normal S1, S2, no murmurs Lungs: CTA bilaterally, no wheezes, rhonchi, or rales Abdomen: +bs, soft, non tender, non distended, no masses, no hepatomegaly, no splenomegaly   Assessment and Plan :     Encounter Diagnoses  Name Primary?  . Post-nasal drip Yes  . Cough   . Constipation   . Need for prophylactic vaccination and inoculation against  influenza    Post nasal drip and cough - begin Mucinex or OTC antihistamine for 1-2 wk.  Call if not improving.  Constipation - possibly medication induced.  Advised he increase fiber and water intake.  Begin miralax daily, consider fiber supplement.  Recheck in 21moif not improving.  Flu vaccine, VIS and counseling given.

## 2011-09-25 NOTE — Patient Instructions (Signed)
For phlegm - begin OTC Mucinex twice daily for a week.  Or consider Zyrtec or Benadryl once daily at bedtime for a week or 2.    For constipation - try increasing dietary fiber to 20 grams daily through sources such as psyllium supplement, fiber one bars, increased whole grains such as oatmeal, pasta, breads, rice, green leafy vegetables.   Consider beginning once daily Miralax powder. This helps with constipation and regularity.

## 2011-09-26 ENCOUNTER — Encounter: Payer: Self-pay | Admitting: Gastroenterology

## 2011-10-17 ENCOUNTER — Ambulatory Visit (INDEPENDENT_AMBULATORY_CARE_PROVIDER_SITE_OTHER): Payer: BC Managed Care – PPO | Admitting: Family Medicine

## 2011-10-17 ENCOUNTER — Encounter: Payer: Self-pay | Admitting: Family Medicine

## 2011-10-17 ENCOUNTER — Ambulatory Visit: Payer: BC Managed Care – PPO | Admitting: Medical

## 2011-10-17 VITALS — BP 120/80 | HR 80 | Ht 74.0 in | Wt 246.0 lb

## 2011-10-17 DIAGNOSIS — K5900 Constipation, unspecified: Secondary | ICD-10-CM

## 2011-10-17 DIAGNOSIS — R05 Cough: Secondary | ICD-10-CM

## 2011-10-17 NOTE — Progress Notes (Signed)
Chief Complaint  Patient presents with  . Constipation    x 2 months. Taking some fiber therapy and an emema daily and still not producing much. Also would like to know if he can get a shingles vaccine?   HPI:  Seroquel was added 2-3 months ago.  Constipation began shortly after starting seroquel, bad for about 2 months. Using Fleet's enema and fiber supplement daily, usually getting 3-4 "rocks", but today was finally looser.  Initially when he tried enema, got a very large "brick" out.  Started the Fleet's enema x 1 week, using fiber supplement x 2 weeks.  Denies nausea, vomiting or fevers.  Colonoscopy 2-3 years ago, through [facility]. At first he thought he had Crohn's, but states that further testing showed that he did not.  He is also complaining of coughing up yellow phelgm x months. Phlegm is thick. Occurs about 1-2x/day.  Nonsmoker.  Denies shortness of breath or chest tightness. No fevers, runny nose, allergy or sinus symptoms, no sore throat.   Past Medical History  Diagnosis Date  . Allergy   . Depression   . Crohn disease     pt reports subsequent testing was normal  . Herpes simplex   . IBS (irritable bowel syndrome)    History   Social History  . Marital Status: Single    Spouse Name: N/A    Number of Children: N/A  . Years of Education: N/A   Occupational History  . Not on file.   Social History Main Topics  . Smoking status: Never Smoker   . Smokeless tobacco: Never Used  . Alcohol Use: No  . Drug Use: No  . Sexually Active: Not on file   Other Topics Concern  . Not on file   Social History Narrative  . No narrative on file   Current outpatient prescriptions:fluvoxaMINE (LUVOX) 100 MG tablet, Take 100 mg by mouth 2 (two) times daily. 2 tabs am and 1 tab qhs, Disp: , Rfl: ;  lamoTRIgine (LAMICTAL) 200 MG tablet, Take 400 mg by mouth at bedtime. , Disp: , Rfl: ;  Methylcellulose, Laxative, (SOLUBLE FIBER PO), Take 500 mg by mouth daily., Disp: , Rfl: ;   Multiple Vitamin (MULTIVITAMIN WITH MINERALS) TABS, Take 1 tablet by mouth daily., Disp: , Rfl:  QUEtiapine (SEROQUEL) 300 MG tablet, Take 600 mg by mouth at bedtime., Disp: , Rfl:   Allergies  Allergen Reactions  . Penicillins     REACTION: "it may kill me"  . Tetracyclines & Related Hives   ROS:  Denies headaches, dizziness, sinus pain, sore throat, swollen glands, shortness of breath, nausea, vomiting, blood or mucus in stools, skin rash or other concerns.  Reports seeing effect since starting Seroquel, improved.  PHYSICAL EXAM: BP 120/80  Pulse 80  Ht 6' 2"  (1.88 m)  Wt 246 lb (111.585 kg)  BMI 31.58 kg/m2 Well developed male in no distress HEENT:  Nasal mucosa normal, with some whitish yellow crusting on left. Mucus membranes normal, no erythema.  OP normal.  Sinuses nontender Neck: no lymphadenopathy, thyromegaly or mass Heart: regular rate and rhythm without murmur Lungs: clear bilaterally Abdomen: soft, normal bowel sounds.  mild tenderness LLQ; no mass, rebound or guarding. Psych: Flat affect, normal mood Skin: no rash  ASSESSMENT/PLAN: 1. Constipation    likely due to side effect of medications  2. Cough    Increase water intake to at least 6-8 8 ounce glasses daily (doesn't have to be water, but a non-caffeinated, non-alcoholic  beverage). Start stool softener daily (colace--docusate sodium).  This doesn't work quickly, but should help soften stools. Continue high fiber diet, and fiber supplement daily. Start taking Miralax every day.  You may not see immediately results, but should within 3-7 days.  You can safely use Miralax longterm.  If your stools are too loose or too often, then cut back on Miralax to either 1/2 capful every day (or less often if needed), or cut back the frequency of use (ie to every other day, sometimes people only need it 2-3x/week).  Daily exercise is recommended.  Cough--reassured no evidence of infection.  Return if increased cough, fever,  shortness of breath.  Likely some PND contributing to cough/phlegm, but no evidence of sinus infection.  Risks/benefits of zostavax reviewed.  Discussed that likely won't be covered until age 58 or 45, and that he should check with his insurance company regarding coverage.  He may return for [place] for vaccine since risks reviewed.

## 2011-10-17 NOTE — Patient Instructions (Signed)
Increase water intake to at least 6-8 8 ounce glasses daily (doesn't have to be water, but a non-caffeinated, non-alcoholic beverage). Start stool softener daily (colace--docusate sodium).  This doesn't work quickly, but should help soften stools. Continue high fiber diet, and fiber supplement daily. Start taking Miralax every day.  You may not see immediately results, but should within 3-7 days.  You can safely use Miralax longterm.  If your stools are too loose or too often, then cut back on Miralax to either 1/2 capful every day (or less often if needed), or cut back the frequency of use (ie to every other day, sometimes people only need it 2-3x/week).  Daily regular exercise will also help keep the bowels moving regularly.  Constipation, Adult Constipation is when a person has fewer than 3 bowel movements a week; has difficulty having a bowel movement; or has stools that are dry, hard, or larger than normal. As people grow older, constipation is more common. If you try to fix constipation with medicines that make you have a bowel movement (laxatives), the problem may get worse. Long-term laxative use may cause the muscles of the colon to become weak. A low-fiber diet, not taking in enough fluids, and taking certain medicines may make constipation worse. CAUSES   Certain medicines, such as antidepressants, pain medicine, iron supplements, antacids, and water pills.   Certain diseases, such as diabetes, irritable bowel syndrome (IBS), thyroid disease, or depression.   Not drinking enough water.   Not eating enough fiber-rich foods.   Stress or travel.  Lack of physical activity or exercise.  Not going to the restroom when there is the urge to have a bowel movement.  Ignoring the urge to have a bowel movement.  Using laxatives too much. SYMPTOMS   Having fewer than 3 bowel movements a week.   Straining to have a bowel movement.   Having hard, dry, or larger than normal stools.    Feeling full or bloated.   Pain in the lower abdomen.  Not feeling relief after having a bowel movement. DIAGNOSIS  Your caregiver will take a medical history and perform a physical exam. Further testing may be done for severe constipation. Some tests may include:   A barium enema X-ray to examine your rectum, colon, and sometimes, your small intestine.  A sigmoidoscopy to examine your lower colon.  A colonoscopy to examine your entire colon. TREATMENT  Treatment will depend on the severity of your constipation and what is causing it. Some dietary treatments include drinking more fluids and eating more fiber-rich foods. Lifestyle treatments may include regular exercise. If these diet and lifestyle recommendations do not help, your caregiver may recommend taking over-the-counter laxative medicines to help you have bowel movements. Prescription medicines may be prescribed if over-the-counter medicines do not work.  HOME CARE INSTRUCTIONS   Increase dietary fiber in your diet, such as fruits, vegetables, whole grains, and beans. Limit high-fat and processed sugars in your diet, such as [place] fries, hamburgers, cookies, candies, and soda.   A fiber supplement may be added to your diet if you cannot get enough fiber from foods.   Drink enough fluids to keep your urine clear or pale yellow.   Exercise regularly or as directed by your caregiver.   Go to the restroom when you have the urge to go. Do not hold it.  Only take medicines as directed by your caregiver. Do not take other medicines for constipation without talking to your caregiver first. SEEK IMMEDIATE  MEDICAL CARE IF:   You have bright red blood in your stool.   Your constipation lasts for more than 4 days or gets worse.   You have abdominal or rectal pain.   You have thin, pencil-like stools.  You have unexplained weight loss. MAKE SURE YOU:   Understand these instructions.  Will watch your  condition.  Will get help right away if you are not doing well or get worse. Document Released: 09/16/2003 Document Revised: 03/12/2011 Document Reviewed: 11/21/2010 [facility] Patient Information 2013 [place].

## 2011-11-10 IMAGING — CR DG CHEST 2V
2 series · 2 of 2 positions shown · non-contrast
Comparison: None.

CLINICAL DATA: Shortness of breath, atypical chest pain.

CHEST - 2 VIEW

[view not recorded (1 of 2)]
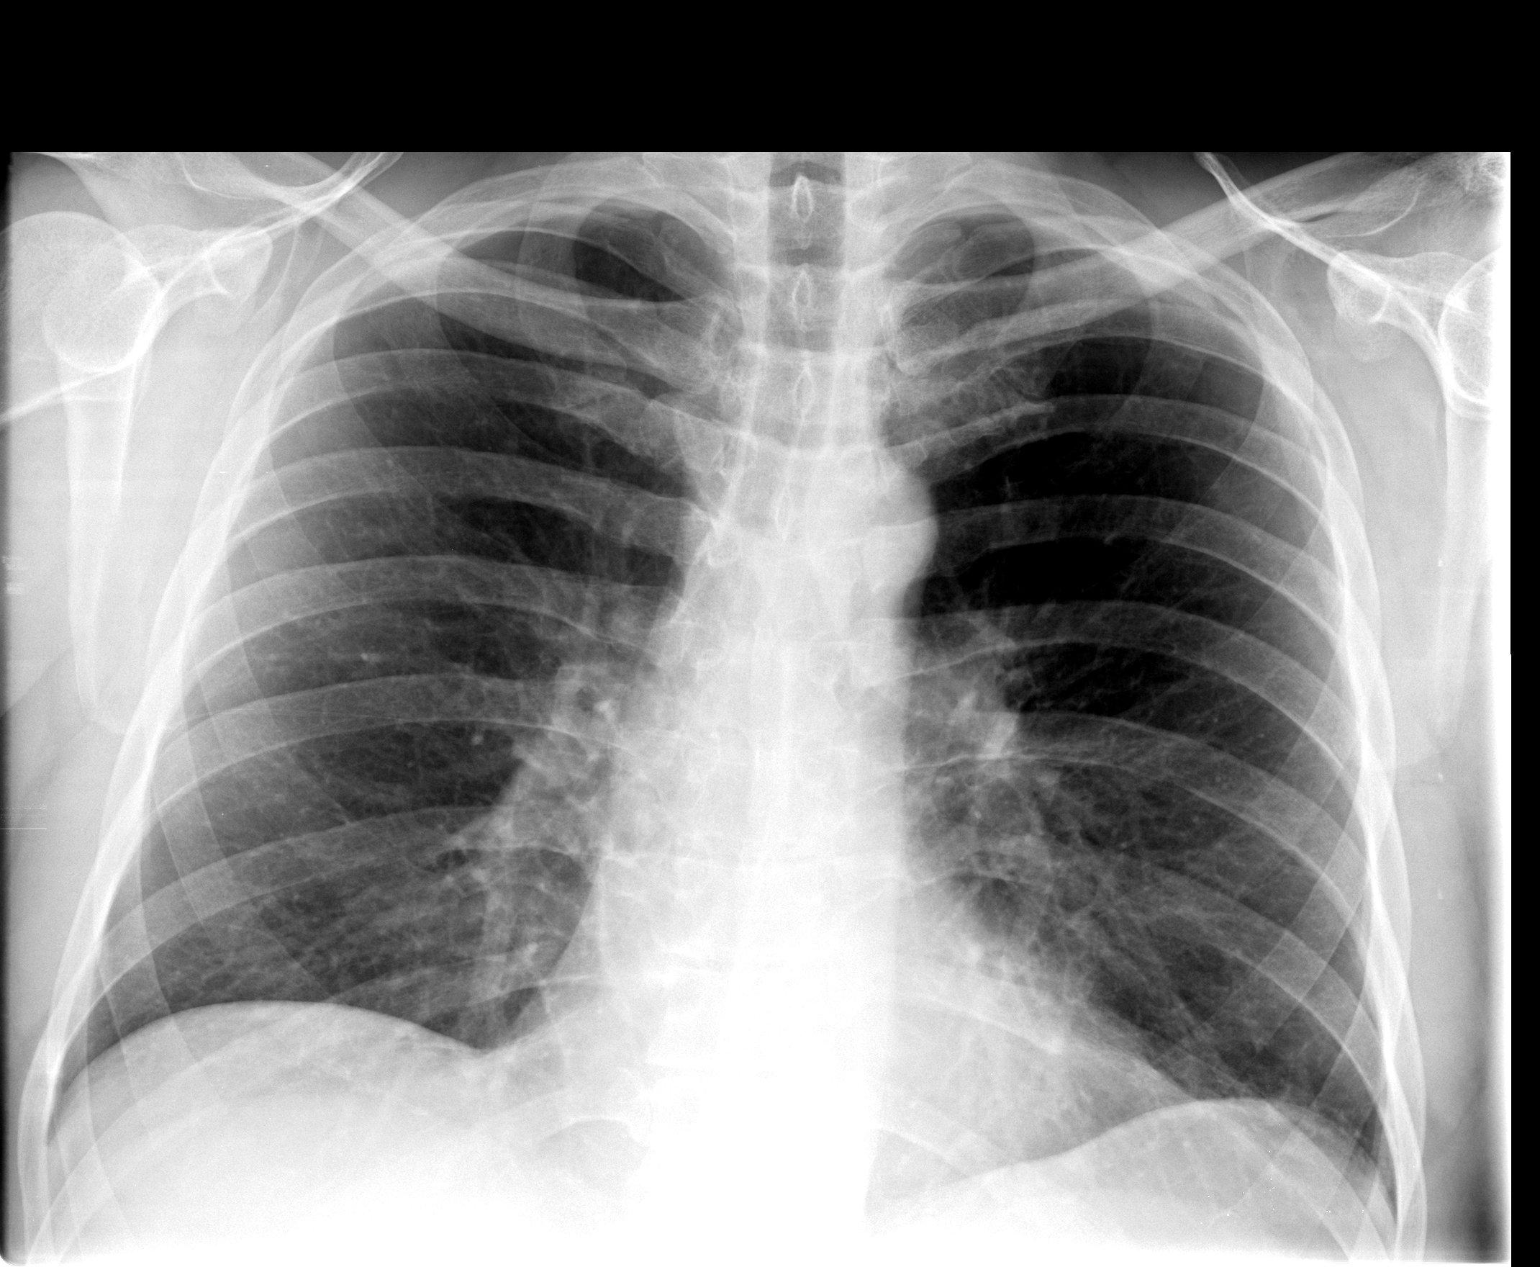

[view not recorded (2 of 2)]
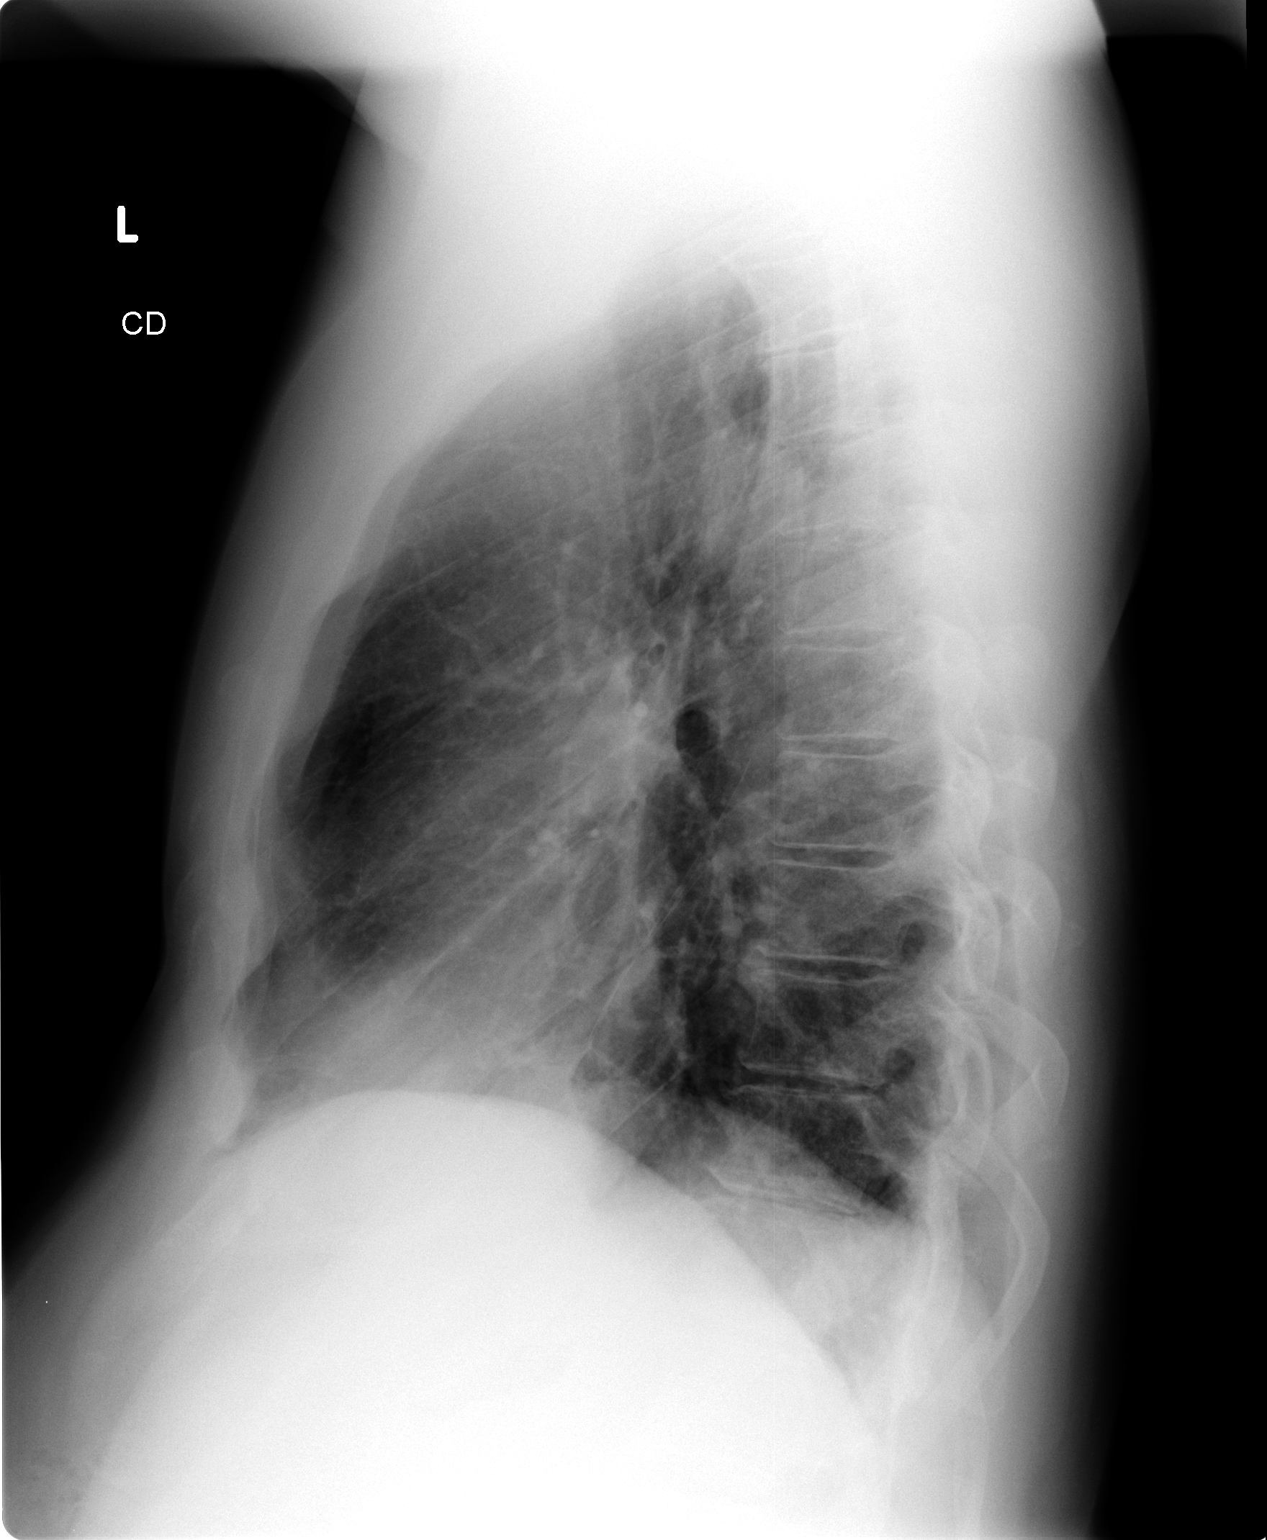

[2 of 2 positions shown; findings below may reference images not displayed]

FINDINGS: Trachea is midline.  Heart size normal.  Lungs are
somewhat low in volume but clear.  No pleural fluid.
IMPRESSION: No acute findings.

## 2012-01-04 ENCOUNTER — Encounter: Payer: Self-pay | Admitting: Medical

## 2012-01-04 ENCOUNTER — Ambulatory Visit (INDEPENDENT_AMBULATORY_CARE_PROVIDER_SITE_OTHER): Payer: BC Managed Care – PPO | Admitting: Medical

## 2012-01-04 VITALS — BP 120/80 | HR 80 | Temp 98.1°F | Resp 16 | Wt 253.0 lb

## 2012-01-04 DIAGNOSIS — M79672 Pain in left foot: Secondary | ICD-10-CM

## 2012-01-04 DIAGNOSIS — M2141 Flat foot [pes planus] (acquired), right foot: Secondary | ICD-10-CM

## 2012-01-04 DIAGNOSIS — M722 Plantar fascial fibromatosis: Secondary | ICD-10-CM

## 2012-01-04 DIAGNOSIS — M79609 Pain in unspecified limb: Secondary | ICD-10-CM

## 2012-01-04 DIAGNOSIS — M2140 Flat foot [pes planus] (acquired), unspecified foot: Secondary | ICD-10-CM

## 2012-01-04 NOTE — Progress Notes (Signed)
Subjective: Here for ongoing left foot and heel pain.  I've seen here before for same pain months ago, and he has also seen Dr. Redmond School in August for same.  He is a Nurse, children's, on feet most of the day, does lots of walking.  He has ongoing pain in left heel for months.   When I last saw him months ago, I had recommended he get some new shoes.  He just got new shoes 2 weeks ago.  He notes no improvement in pain.  Still has pain in left heel radiating to lateral side of heel.  Nothing seems to help.  On feet all day.    Past Medical History  Diagnosis Date  . Allergy   . Depression   . Crohn disease     pt reports subsequent testing was normal  . Herpes simplex   . IBS (irritable bowel syndrome)    ROS as noted in HPI  Objective: General appearance: alert, no distress, WD/WN MSK: tender along left heel, left plantar fascia medially, and pes planus noted bilat, otherwise nontender, normal ROM Lower extremity neurovascularly intact  Assessment and Plan :    Encounter Diagnoses  Name Primary?  . Pain of left heel Yes  . Plantar fasciitis of left foot   . Pes planus (flat feet)    Discussed daily stretching routine with towel stretch, daily tennis ball massage, wearing shoes in the house for support, begin heel cups that he never started, and if no improvement in the next few weeks with the addition of these measures, call back for referral to podiatry.

## 2012-01-04 NOTE — Patient Instructions (Addendum)
Use towel stretch for the foot and tennis ball massage every morning.  Wears shoes in the house for support  Consider ice water bottle massage when the foot hurts  Consider heel cups OTC for support.  If not improving over the next few weeks, let us know and we will refer to podiatry.  Plantar Fasciitis Plantar fasciitis is a common condition that causes foot pain. It is soreness (inflammation) of the band of tough fibrous tissue on the bottom of the foot that runs from the heel bone (calcaneus) to the ball of the foot. The cause of this soreness may be from excessive standing, poor fitting shoes, running on hard surfaces, being overweight, having an abnormal walk, or overuse (this is common in runners) of the painful foot or feet. It is also common in aerobic exercise dancers and ballet dancers. SYMPTOMS  Most people with plantar fasciitis complain of:  Severe pain in the morning on the bottom of their foot especially when taking the first steps out of bed. This pain recedes after a few minutes of walking.  Severe pain is experienced also during walking following a long period of inactivity.  Pain is worse when walking barefoot or up stairs DIAGNOSIS   Your caregiver will diagnose this condition by examining and feeling your foot.  Special tests such as X-rays of your foot, are usually not needed. PREVENTION   Consult a sports medicine professional before beginning a new exercise program.  Walking programs offer a good workout. With walking there is a lower chance of overuse injuries common to runners. There is less impact and less jarring of the joints.  Begin all new exercise programs slowly. If problems or pain develop, decrease the amount of time or distance until you are at a comfortable level.  Wear good shoes and replace them regularly.  Stretch your foot and the heel cords at the back of the ankle (Achilles tendon) both before and after exercise.  Run or exercise on even  surfaces that are not hard. For example, asphalt is better than pavement.  Do not run barefoot on hard surfaces.  If using a treadmill, vary the incline.  Do not continue to workout if you have foot or joint problems. Seek professional help if they do not improve. HOME CARE INSTRUCTIONS   Avoid activities that cause you pain until you recover.  Use ice or cold packs on the problem or painful areas after working out.  Only take over-the-counter or prescription medicines for pain, discomfort, or fever as directed by your caregiver.  Soft shoe inserts or athletic shoes with air or gel sole cushions may be helpful.  If problems continue or become more severe, consult a sports medicine caregiver or your own health care provider. Cortisone is a potent anti-inflammatory medication that may be injected into the painful area. You can discuss this treatment with your caregiver. MAKE SURE YOU:   Understand these instructions.  Will watch your condition.  Will get help right away if you are not doing well or get worse. Document Released: 09/12/2000 Document Revised: 03/12/2011 Document Reviewed: 11/12/2007 [facility] Patient Information 2013 [place].

## 2012-09-18 ENCOUNTER — Encounter: Payer: Self-pay | Admitting: Family Medicine

## 2012-09-18 ENCOUNTER — Ambulatory Visit (INDEPENDENT_AMBULATORY_CARE_PROVIDER_SITE_OTHER): Payer: BC Managed Care – PPO | Admitting: Family Medicine

## 2012-09-18 VITALS — BP 118/82 | HR 86 | Wt 258.0 lb

## 2012-09-18 DIAGNOSIS — D229 Melanocytic nevi, unspecified: Secondary | ICD-10-CM

## 2012-09-18 DIAGNOSIS — N318 Other neuromuscular dysfunction of bladder: Secondary | ICD-10-CM

## 2012-09-18 DIAGNOSIS — E669 Obesity, unspecified: Secondary | ICD-10-CM

## 2012-09-18 DIAGNOSIS — J302 Other seasonal allergic rhinitis: Secondary | ICD-10-CM

## 2012-09-18 DIAGNOSIS — N3281 Overactive bladder: Secondary | ICD-10-CM

## 2012-09-18 DIAGNOSIS — K589 Irritable bowel syndrome without diarrhea: Secondary | ICD-10-CM

## 2012-09-18 DIAGNOSIS — F3289 Other specified depressive episodes: Secondary | ICD-10-CM

## 2012-09-18 DIAGNOSIS — J309 Allergic rhinitis, unspecified: Secondary | ICD-10-CM

## 2012-09-18 DIAGNOSIS — F329 Major depressive disorder, single episode, unspecified: Secondary | ICD-10-CM

## 2012-09-18 DIAGNOSIS — D239 Other benign neoplasm of skin, unspecified: Secondary | ICD-10-CM

## 2012-09-18 LAB — CBC WITH DIFFERENTIAL/PLATELET
Basophils Absolute: 0 10*3/uL (ref 0.0–0.1)
Basophils Relative: 0 % (ref 0–1)
Eosinophils Relative: 3 % (ref 0–5)
Lymphocytes Relative: 30 % (ref 12–46)
MCHC: 35.3 g/dL (ref 30.0–36.0)
Neutro Abs: 5.5 10*3/uL (ref 1.7–7.7)
Platelets: 389 10*3/uL (ref 150–400)
RDW: 13.7 % (ref 11.5–15.5)
WBC: 9.2 10*3/uL (ref 4.0–10.5)

## 2012-09-18 MED ORDER — TAMSULOSIN HCL 0.4 MG PO CAPS: 0.4000 mg | ORAL_CAPSULE | Freq: Every day | ORAL | Status: DC

## 2012-09-18 NOTE — Progress Notes (Signed)
  Subjective:    Patient ID: Zachary [name], male    DOB: [birth year], 52 y.o.   MRN: [id number]  HPI He is here for consult concerning moles on his back he would like evaluated. He continues to have difficulty with soiling and is now using Depends.is also noted difficulty with overactive bladder symptoms. Apparently he was evaluated in the past by urology and they recommended medication which she did not start on. He continues to be followed by psychiatry and is on multiple medications for this. His allergies seem to be under good control. Review his record indicates no recent blood work.   Review of Systems     Objective:   Physical Exam Alert and in no distress with appropriate affect. His weight is recorded. 3 mol noted on the back that are all benign in appearance.       Assessment & Plan:  DEPRESSION  Irritable bowel syndrome - Plan: CBC with Differential, Comprehensive metabolic panel  Seasonal allergies  Obesity (BMI 30-39.9) - Plan: CBC with Differential, Comprehensive metabolic panel, Lipid panel  OAB (overactive bladder) - Plan: tamsulosin (FLOMAX) 0.4 MG CAPS capsule  Numerous moles I reassured him that the moles were noted to be concerned about. Will continue to treat his allergies as needed. Discussed his weight in regard to diet and exercise especially cutting back on it carbohydrates. I discussed possibly referring him back to urology but he would like to try medication to help keep his costs down.let me know how this works. He will continue on his psychotropic medications.

## 2012-09-19 LAB — LIPID PANEL
HDL: 44 mg/dL (ref >39)
LDL Cholesterol: 83 mg/dL (ref 0–99)
Total CHOL/HDL Ratio: 4.4 Ratio
VLDL: 67 mg/dL — ABNORMAL HIGH (ref 0–40)

## 2012-09-19 LAB — COMPREHENSIVE METABOLIC PANEL
ALT: 42 U/L (ref 0–53)
AST: 29 U/L (ref 0–37)
Alkaline Phosphatase: 86 U/L (ref 39–117)
BUN: 12 mg/dL (ref 6–23)
Calcium: 9.5 mg/dL (ref 8.4–10.5)
Chloride: 101 mEq/L (ref 96–112)
Creat: 1.04 mg/dL (ref 0.50–1.35)
Potassium: 4 mEq/L (ref 3.5–5.3)

## 2012-12-11 ENCOUNTER — Telehealth: Payer: Self-pay

## 2012-12-11 NOTE — Telephone Encounter (Signed)
I CALLED PT TO GIVE INFORMATION I LEFT MESSAGE WORD FOR WORD Let him know that his triglycerides were elevated and his blood sugar was slightly above the normal range. Send him dietary information. Explain that my assessment and accommodations somehow got lost in the computer system. SENT DIET INFO

## 2012-12-11 NOTE — Telephone Encounter (Signed)
Let him know that his triglycerides were elevated and his blood sugar was slightly above the normal range. Send him dietary information. Explain that my assessment and accommodations somehow got lost in the computer system.

## 2012-12-11 NOTE — Telephone Encounter (Signed)
Go ahead and do an A1c and explain that my masses got lost in the computer system. Let him know that we will have the results of that later today are definitely tomorrow

## 2012-12-11 NOTE — Telephone Encounter (Signed)
DR.LALONDE THIS IS FROM SEPT WE CAN NOT DO A A1C

## 2012-12-11 NOTE — Telephone Encounter (Signed)
PT CALLED AND LEFT MESSAGE ON PHONE HE WANTED TO KNOW RESULTS FROM LAST OV LABS I LOOKED AND IT SEEMS LIKE I DIDN'T GET THE MESSAGE AGAIN YOU WANTED JO TO DO A1C AND I DONT EVEN SEE THAT SO PLEASE ADVISE

## 2013-07-30 ENCOUNTER — Ambulatory Visit (INDEPENDENT_AMBULATORY_CARE_PROVIDER_SITE_OTHER): Payer: BC Managed Care – PPO | Admitting: Family Medicine

## 2013-07-30 ENCOUNTER — Encounter: Payer: Self-pay | Admitting: Family Medicine

## 2013-07-30 VITALS — BP 124/80 | HR 68 | Ht 75.0 in | Wt 244.0 lb

## 2013-07-30 DIAGNOSIS — Z0000 Encounter for general adult medical examination without abnormal findings: Secondary | ICD-10-CM

## 2013-07-30 DIAGNOSIS — B009 Herpesviral infection, unspecified: Secondary | ICD-10-CM

## 2013-07-30 DIAGNOSIS — J302 Other seasonal allergic rhinitis: Secondary | ICD-10-CM

## 2013-07-30 DIAGNOSIS — F3289 Other specified depressive episodes: Secondary | ICD-10-CM

## 2013-07-30 DIAGNOSIS — F329 Major depressive disorder, single episode, unspecified: Secondary | ICD-10-CM

## 2013-07-30 DIAGNOSIS — J309 Allergic rhinitis, unspecified: Secondary | ICD-10-CM

## 2013-07-30 LAB — CBC WITH DIFFERENTIAL/PLATELET
Basophils Absolute: 0 10*3/uL (ref 0.0–0.1)
Basophils Relative: 0 % (ref 0–1)
EOS ABS: 0.3 10*3/uL (ref 0.0–0.7)
EOS PCT: 3 % (ref 0–5)
HCT: 41.9 % (ref 39.0–52.0)
HEMOGLOBIN: 14.6 g/dL (ref 13.0–17.0)
LYMPHS ABS: 2.2 10*3/uL (ref 0.7–4.0)
Lymphocytes Relative: 26 % (ref 12–46)
MCH: 30.9 pg (ref 26.0–34.0)
MCHC: 34.8 g/dL (ref 30.0–36.0)
MCV: 88.8 fL (ref 78.0–100.0)
MONOS PCT: 8 % (ref 3–12)
Monocytes Absolute: 0.7 10*3/uL (ref 0.1–1.0)
Neutro Abs: 5.3 10*3/uL (ref 1.7–7.7)
Neutrophils Relative %: 63 % (ref 43–77)
Platelets: 336 10*3/uL (ref 150–400)
RBC: 4.72 MIL/uL (ref 4.22–5.81)
RDW: 14 % (ref 11.5–15.5)
WBC: 8.4 10*3/uL (ref 4.0–10.5)

## 2013-07-30 LAB — POCT URINALYSIS DIPSTICK
BILIRUBIN UA: NEGATIVE
GLUCOSE UA: NEGATIVE
Ketones, UA: NEGATIVE
Leukocytes, UA: NEGATIVE
Nitrite, UA: NEGATIVE
Protein, UA: NEGATIVE
RBC UA: NEGATIVE
Urobilinogen, UA: NEGATIVE
pH, UA: 7

## 2013-07-30 NOTE — Patient Instructions (Signed)
Cut back on white food. Bread, rice, pasta, potatoes, sugar

## 2013-07-30 NOTE — Progress Notes (Signed)
   Subjective:    Patient ID: Zachary [name], male    DOB: June 07, 1960, 53 y.o.   MRN: [id number]  HPI He is here for complete examination. He does have a history of depression and is now well controlled on his present medications. He continues to be followed by Dr. Clovis Pu. He also is had difficulty with GI complaints over the years. Review his record indicates IBS/Crohn's disease. The diagnosis is equivocal and he will continue to be monitored. He is also had rare bouts of incontinence with this. He will see his gastroenterologist in the near future for followup. His allergies are under good control. He has very little difficulty with his herpes. His work and home life are unchanged. He has no other concerns or complaints.   Review of Systems  All other systems reviewed and are negative.      Objective:   Physical Exam BP 124/80  Pulse 68  Ht 6' 3"  (1.905 m)  Wt 244 lb (110.678 kg)  BMI 30.50 kg/m2  General Appearance:    Alert, cooperative, no distress, appears stated age  Head:    Normocephalic, without obvious abnormality, atraumatic  Eyes:    PERRL, conjunctiva/corneas clear, EOM's intact, fundi    benign, both eyes       Ears:    Normal TM's and external ear canals, both ears  Nose:   Nares normal, septum midline, mucosa normal, no drainage   or sinus tenderness  Throat:   Lips, mucosa, and tongue normal; teeth and gums normal  Neck:   Supple, symmetrical, trachea midline, no adenopathy;       thyroid:  No enlargement/tenderness/nodules; no carotid   bruit or JVD  Back:     Symmetric, no curvature, ROM normal, no CVA tenderness  Lungs:     Clear to auscultation bilaterally, respirations unlabored  Chest wall:    No tenderness or deformity  Heart:    Regular rate and rhythm, S1 and S2 normal, no murmur, rub   or gallop  Abdomen:     Soft, non-tender, bowel sounds active all four quadrants,    no masses, no organomegaly        Extremities:   Extremities normal,  atraumatic, no cyanosis or edema  Pulses:   2+ and symmetric all extremities  Skin:   Skin color, texture, turgor normal, no rashes or lesions  Lymph nodes:   Cervical, supraclavicular, and axillary nodes normal  Neurologic:   CNII-XII intact. Normal strength, sensation and reflexes      throughout         Assessment & Plan:  DEPRESSION  Seasonal allergies  Herpes simplex  Routine general medical examination at a health care facility - Plan: POCT Urinalysis Dipstick, CBC with Differential, Comprehensive metabolic panel, Lipid panel  he will continue on his present medication regimen. I did discuss his weight and encouraged him to eat well-balanced diet and cut back on carbohydrates.

## 2013-07-31 LAB — COMPREHENSIVE METABOLIC PANEL
ALT: 38 U/L (ref 0–53)
AST: 28 U/L (ref 0–37)
Albumin: 3.8 g/dL (ref 3.5–5.2)
Alkaline Phosphatase: 64 U/L (ref 39–117)
BILIRUBIN TOTAL: 0.5 mg/dL (ref 0.2–1.2)
BUN: 10 mg/dL (ref 6–23)
CO2: 26 meq/L (ref 19–32)
CREATININE: 0.91 mg/dL (ref 0.50–1.35)
Calcium: 8.7 mg/dL (ref 8.4–10.5)
Chloride: 102 mEq/L (ref 96–112)
GLUCOSE: 89 mg/dL (ref 70–99)
Potassium: 4.7 mEq/L (ref 3.5–5.3)
SODIUM: 138 meq/L (ref 135–145)
TOTAL PROTEIN: 6.3 g/dL (ref 6.0–8.3)

## 2013-07-31 LAB — LIPID PANEL
CHOLESTEROL: 209 mg/dL — AB (ref 0–200)
HDL: 58 mg/dL (ref >39)
LDL Cholesterol: 95 mg/dL (ref 0–99)
TRIGLYCERIDES: 281 mg/dL — AB (ref <150)
Total CHOL/HDL Ratio: 3.6 Ratio
VLDL: 56 mg/dL — AB (ref 0–40)

## 2013-09-02 ENCOUNTER — Encounter: Payer: Self-pay | Admitting: Internal Medicine

## 2014-02-02 ENCOUNTER — Telehealth: Payer: Self-pay | Admitting: Family Medicine

## 2014-02-02 NOTE — Telephone Encounter (Signed)
Pt called and stated that his GI doc, Dr. Sharlett Iles, has retired. He would like a referral to a new GI doc because it is time for him to have a colonoscopy. Pt can be reached at [phone].

## 2014-02-02 NOTE — Telephone Encounter (Signed)
Have him good helped up with one of the [place]

## 2014-02-03 ENCOUNTER — Other Ambulatory Visit: Payer: Self-pay

## 2014-02-03 DIAGNOSIS — Z1211 Encounter for screening for malignant neoplasm of colon: Secondary | ICD-10-CM

## 2014-02-03 NOTE — Telephone Encounter (Signed)
Called [facility] they said they will call the patient and make appointment called patient to inform him left message for him That they will call him they will only call one time so if he misses the call please call them back so he can make appointment

## 2014-02-04 ENCOUNTER — Encounter: Payer: Self-pay | Admitting: Gastroenterology

## 2014-03-11 ENCOUNTER — Ambulatory Visit (AMBULATORY_SURGERY_CENTER): Payer: Self-pay | Admitting: *Deleted

## 2014-03-11 VITALS — Ht 75.0 in | Wt 253.2 lb

## 2014-03-11 DIAGNOSIS — Z86010 Personal history of colonic polyps: Secondary | ICD-10-CM

## 2014-03-11 DIAGNOSIS — K5090 Crohn's disease, unspecified, without complications: Secondary | ICD-10-CM

## 2014-03-11 MED ORDER — MOVIPREP 100 G PO SOLR: 1.0000 | Freq: Once | ORAL | Status: DC

## 2014-03-11 NOTE — Progress Notes (Signed)
No egg or soy allergy-he doesn't eat eggs  No problems with past sedation No home 02 No diet pills Pt declined emmi video  Pt states he has had a hx of contipation but not lately, he has been taking target fiber and stools soft.

## 2014-03-24 ENCOUNTER — Encounter: Payer: Self-pay | Admitting: Gastroenterology

## 2014-04-06 ENCOUNTER — Encounter: Payer: Self-pay | Admitting: Gastroenterology

## 2014-04-12 ENCOUNTER — Ambulatory Visit: Payer: Self-pay | Admitting: Family Medicine

## 2014-04-19 ENCOUNTER — Ambulatory Visit (INDEPENDENT_AMBULATORY_CARE_PROVIDER_SITE_OTHER): Payer: BLUE CROSS/BLUE SHIELD | Admitting: Family Medicine

## 2014-04-19 VITALS — BP 126/84 | HR 75 | Wt 244.0 lb

## 2014-04-19 DIAGNOSIS — Z8042 Family history of malignant neoplasm of prostate: Secondary | ICD-10-CM

## 2014-04-19 DIAGNOSIS — K640 First degree hemorrhoids: Secondary | ICD-10-CM | POA: Diagnosis not present

## 2014-04-19 NOTE — Progress Notes (Signed)
   Subjective:    Patient ID: Zachary [name], male    DOB: 06/14/1960, 54 y.o.   MRN: [id number]  HPI He is here for multiple issues. He states that he feels a vibrating sensation in his right elbow but no numbness tingling or weakness. It tends to come and go without warning. He also would like to be PSA tested. He does have a family history of prostate cancer with father in several uncles having cancer. He also is been having difficulty for the last month with a hemorrhoid that is causing very little difficulty but does not tend to want to diminish.   Review of Systems     Objective:   Physical Exam Alert and in no distress. Exam of the right elbow shows full motion. No crepitus. No effusion noted. No tenderness to palpation. Normal strength. Exam of the anal area does show a hemorrhoid at the 9:00 position however is nontender       Assessment & Plan:  Family history of prostate cancer in father - Plan: PSA  First degree hemorrhoids recommend conservative care for the hemorrhoid with heat, ensuring softer BMs. Explained that if it becomes more painful, to return here. At the end of the interview he did mention is continued difficulty with flatulence. Recommend he check the Internet concerning foods that increase gas production and try to avoid them.  I also discussed the fact that his elbow exam was entirely negativeAnd he was comfortable with no further evaluation.

## 2014-04-20 LAB — PSA: PSA: 0.6 ng/mL (ref <=4.00)

## 2014-04-22 ENCOUNTER — Ambulatory Visit (AMBULATORY_SURGERY_CENTER): Payer: BLUE CROSS/BLUE SHIELD | Admitting: Gastroenterology

## 2014-04-22 ENCOUNTER — Encounter: Payer: Self-pay | Admitting: Gastroenterology

## 2014-04-22 VITALS — BP 128/78 | HR 66 | Temp 96.5°F | Resp 24 | Ht 75.0 in | Wt 253.0 lb

## 2014-04-22 DIAGNOSIS — K633 Ulcer of intestine: Secondary | ICD-10-CM | POA: Diagnosis not present

## 2014-04-22 DIAGNOSIS — K635 Polyp of colon: Secondary | ICD-10-CM

## 2014-04-22 DIAGNOSIS — K529 Noninfective gastroenteritis and colitis, unspecified: Secondary | ICD-10-CM

## 2014-04-22 DIAGNOSIS — R197 Diarrhea, unspecified: Secondary | ICD-10-CM | POA: Diagnosis present

## 2014-04-22 DIAGNOSIS — D125 Benign neoplasm of sigmoid colon: Secondary | ICD-10-CM

## 2014-04-22 MED ORDER — SODIUM CHLORIDE 0.9 % IV SOLN: 500.0000 mL | INTRAVENOUS | Status: DC

## 2014-04-22 NOTE — Patient Instructions (Signed)
YOU HAD AN ENDOSCOPIC PROCEDURE TODAY AT [facility]:   Refer to the procedure report that was given to you for any specific questions about what was found during the examination.  If the procedure report does not answer your questions, please call your gastroenterologist to clarify.  If you requested that your care partner not be given the details of your procedure findings, then the procedure report has been included in a sealed envelope for you to review at your convenience later.  YOU SHOULD EXPECT: Some feelings of bloating in the abdomen. Passage of more gas than usual.  Walking can help get rid of the air that was put into your GI tract during the procedure and reduce the bloating. If you had a lower endoscopy (such as a colonoscopy or flexible sigmoidoscopy) you may notice spotting of blood in your stool or on the toilet paper. If you underwent a bowel prep for your procedure, you may not have a normal bowel movement for a few days.  Please Note:  You might notice some irritation and congestion in your nose or some drainage.  This is from the oxygen used during your procedure.  There is no need for concern and it should clear up in a day or so.  SYMPTOMS TO REPORT IMMEDIATELY:   Following lower endoscopy (colonoscopy or flexible sigmoidoscopy):  Excessive amounts of blood in the stool  Significant tenderness or worsening of abdominal pains  Swelling of the abdomen that is new, acute  Fever of 100F or higher   For urgent or emergent issues, a gastroenterologist can be reached at any hour by calling [phone].   DIET: Your first meal following the procedure should be a small meal and then it is ok to progress to your normal diet. Heavy or fried foods are harder to digest and may make you feel nauseous or bloated.  Likewise, meals heavy in dairy and vegetables can increase bloating.  Drink plenty of fluids but you should avoid alcoholic beverages for 24  hours.  ACTIVITY:  You should plan to take it easy for the rest of today and you should NOT DRIVE or use heavy machinery until tomorrow (because of the sedation medicines used during the test).    FOLLOW UP: Our staff will call the number listed on your records the next business day following your procedure to check on you and address any questions or concerns that you may have regarding the information given to you following your procedure. If we do not reach you, we will leave a message.  However, if you are feeling well and you are not experiencing any problems, there is no need to return our call.  We will assume that you have returned to your regular daily activities without incident.  If any biopsies were taken you will be contacted by phone or by letter within the next 1-3 weeks.  Please call us at [phone] if you have not heard about the biopsies in 3 weeks.    SIGNATURES/CONFIDENTIALITY: You and/or your care partner have signed paperwork which will be entered into your electronic medical record.  These signatures attest to the fact that that the information above on your After Visit Summary has been reviewed and is understood.  Full responsibility of the confidentiality of this discharge information lies with you and/or your care-partner.    Handout was given to your care partner on polyps. You may resume your current medications today. Await biopsy results. Please call for  an appointment to follow up with Dr. Fuller Plan for 4 weeks. Work excuse note given to pt's care partner. Please call if any questions or concerns.

## 2014-04-22 NOTE — Op Note (Signed)
[place]  [address] [place], [id number]   COLONOSCOPY PROCEDURE REPORT  PATIENT: Zachary [name], Zachary [name]  MR#: [id number] BIRTHDATE: Jul 18, 1960 , 54  yrs. old GENDER: male ENDOSCOPIST: Ladene Artist, MD, [facility] REFERRED EL:[employer], M.D. PROCEDURE DATE:  04/22/2014 PROCEDURE:   Colonoscopy, diagnostic, Colonoscopy with biopsy, and Colonoscopy with snare polypectomy First Screening Colonoscopy - Avg.  risk and is 50 yrs.  old or older - No.  Prior Negative Screening - Now for repeat screening. N/A  History of Adenoma - Now for follow-up colonoscopy & has been > or = to 3 yrs.  N/A ASA CLASS:   Class II INDICATIONS:Clinically significant diarrhea of unexplained origin, Inflammatory bowel disease of the intestine if more precise diagnosis or determination of the extent / severity of activity of disease will influence immediate / future management, and Colorectal Neoplasm Risk Assessment for this procedure is average risk. MEDICATIONS: Monitored anesthesia care and Propofol 400 mg IV DESCRIPTION OF PROCEDURE:   After the risks benefits and alternatives of the procedure were thoroughly explained, informed consent was obtained.  The digital rectal exam revealed no abnormalities of the rectum.   The LB [id number]  endoscope was introduced through the anus and advanced to the terminal ileum which was intubated for a short distance. No adverse events experienced.   The quality of the prep was good.  (Suprep was used) The instrument was then slowly withdrawn as the colon was fully examined.  COLON FINDINGS: Two non-bleeding, shallow and round ulcers were found at the ileocecal valve.  Biopsies were taken around the ulcers.   Multiple shallow and round erosions were found in the terminal ileum.  Multiple biopsies of the area were performed.   A sessile polyp measuring 6 mm in size was found in the sigmoid colon.  A polypectomy was performed with a cold snare.   The resection was complete, the polyp tissue was completely retrieved and sent to histology.   The examination was otherwise normal. Retroflexed views revealed no abnormalities. The time to cecum = 4.0 Withdrawal time = 11.2   The scope was withdrawn and the procedure completed. COMPLICATIONS: There were no immediate complications.  ENDOSCOPIC IMPRESSION: 1.   Two ulcers at the ileocecal valve; biopsies performed 2.   Multiple erosions in the terminal ileum; multiple biopsies performed 3.   Sessile polyp in the sigmoid colon; polypectomy performed with a cold snare   RECOMMENDATIONS: 1.  Await pathology results 2.  Repeat colonoscopy in 5 years if polyp adenomatous; otherwise 10 years 3.  Call office for follow-up appointment in 4 weeks  eSigned:  Ladene Artist, MD, [facility] 04/22/2014 10:38 AM     PATIENT NAME:  Zachary [name] MR#: [id number]

## 2014-04-22 NOTE — Progress Notes (Signed)
No problems noted in the recovery room. maw 

## 2014-04-22 NOTE — Progress Notes (Signed)
Patient awakening vss, report to rn

## 2014-04-22 NOTE — Progress Notes (Signed)
Called to room to assist during endoscopic procedure.  Patient ID and intended procedure confirmed with present staff. Received instructions for my participation in the procedure from the performing physician.  

## 2014-04-23 ENCOUNTER — Telehealth: Payer: Self-pay | Admitting: *Deleted

## 2014-04-23 NOTE — Telephone Encounter (Signed)
  Follow up Call-  Call back number 04/22/2014  Post procedure Call Back phone  # [phone]  Permission to leave phone message Yes     Patient questions:  Do you have a fever, pain , or abdominal swelling? No. Pain Score  0 *  Have you tolerated food without any problems? Yes.    Have you been able to return to your normal activities? Yes.    Do you have any questions about your discharge instructions: Diet   No. Medications  No. Follow up visit  No.  Do you have questions or concerns about your Care? No.  Actions: * If pain score is 4 or above: No action needed, pain <4.

## 2014-05-06 ENCOUNTER — Encounter: Payer: Self-pay | Admitting: Gastroenterology

## 2014-06-24 ENCOUNTER — Ambulatory Visit: Payer: BLUE CROSS/BLUE SHIELD | Admitting: Family Medicine

## 2014-09-14 ENCOUNTER — Encounter: Payer: Self-pay | Admitting: Family Medicine

## 2014-09-14 ENCOUNTER — Ambulatory Visit (INDEPENDENT_AMBULATORY_CARE_PROVIDER_SITE_OTHER): Payer: 59 | Admitting: Family Medicine

## 2014-09-14 VITALS — BP 116/80 | HR 74 | Wt 220.0 lb

## 2014-09-14 DIAGNOSIS — F321 Major depressive disorder, single episode, moderate: Secondary | ICD-10-CM | POA: Diagnosis not present

## 2014-09-14 NOTE — Progress Notes (Signed)
   Subjective:    Patient ID: Zachary [name], male    DOB: Apr 21, 1960, 54 y.o.   MRN: [id number]  HPI He is here for evaluation of possible Alzheimer's disease. He checked on [employer] and has concerns. He states that he is not processing words as well. He states that he can listen to his boss but doesn't sink in. He also describes short-term memory issues but then is able to 3 murmur when he did earlier in the day. He does not describe repeating himself, word searching or difficulty with or orientation to person, place and time.He notes no changes in his ability to read, write or copy. He did see a psychiatrist one month ago but did not think to mention it.   Review of Systems     Objective:   Physical Exam Alert and oriented 3 with appropriate affect. No evidence of word searching noted.       Assessment & Plan:  Major depressive disorder, single episode, moderate Recommend that he follow-up with his psychiatrist on these issues to make sure that there is not a psychiatric reason for this as I do not think this is related to early Alzheimer's. He was comfortable with this.

## 2014-10-19 ENCOUNTER — Encounter: Payer: Self-pay | Admitting: Family Medicine

## 2014-10-19 ENCOUNTER — Ambulatory Visit (INDEPENDENT_AMBULATORY_CARE_PROVIDER_SITE_OTHER): Payer: 59 | Admitting: Family Medicine

## 2014-10-19 VITALS — BP 120/80 | HR 68 | Ht 75.0 in | Wt 214.0 lb

## 2014-10-19 DIAGNOSIS — L309 Dermatitis, unspecified: Secondary | ICD-10-CM | POA: Diagnosis not present

## 2014-10-19 NOTE — Progress Notes (Signed)
   Subjective:    Patient ID: Zachary [name], male    DOB: 17-Apr-1960, 54 y.o.   MRN: [id number]  HPI He is here for evaluation of 2 lesions present on the dorsal surface of the  Left third finger. It only bothers him when it is touched otherwise he has no symptoms from it. He has not noted these lesions anywhere else on his body.   Review of Systems     Objective:   Physical Exam 2 erythematous 1 cm slightly raised lesions are present on the midportion of the dorsal surface left third finger. Exam of the rest of the hand and his right hand as well as feet shows no other lesions.       Assessment & Plan:  Dermatitis I explained that since he is not having any difficulty, treating this with benign neglect as appropriate. If he sees more these lesions, he will call me.

## 2014-11-30 ENCOUNTER — Encounter: Payer: Self-pay | Admitting: Gastroenterology

## 2015-03-30 ENCOUNTER — Ambulatory Visit: Payer: 59 | Admitting: Family Medicine

## 2015-03-30 ENCOUNTER — Ambulatory Visit (INDEPENDENT_AMBULATORY_CARE_PROVIDER_SITE_OTHER): Payer: 59 | Admitting: Family Medicine

## 2015-03-30 ENCOUNTER — Encounter: Payer: Self-pay | Admitting: Family Medicine

## 2015-03-30 VITALS — BP 120/78 | HR 68 | Temp 98.0°F | Wt 213.2 lb

## 2015-03-30 DIAGNOSIS — S0993XA Unspecified injury of face, initial encounter: Secondary | ICD-10-CM

## 2015-03-30 NOTE — Progress Notes (Signed)
   Subjective:    Patient ID: Zachary [name], male    DOB: Dec 23, 1960, 55 y.o.   MRN: [id number]  HPI Chief Complaint  Patient presents with  . infected tongue    infected tongue. was chewing food saturday and bit down on tongue. swollen and can see the bite mark   He is here with complaints of painful area to the right side of his tongue since he bit down on it 5 days ago while eating. States he occasionally bites his tongue and he eats fast. He reports pain to the area when hit teeth hit it but otherwise it is not painful. Denies history of oral lesions. He states he had a dental exam in past 3 months. He does not smoke and denies chewing tobacco. He has not tried any treatment for pain.  Denies fever, chills, sore throat, cough, fatigue or unexplained weight loss. States he just wants to make sure his tongue is not infected.  No other concerns today.    Review of Systems Pertinent positives and negatives in the history of present illness.     Objective:   Physical Exam BP 120/78 mmHg  Pulse 68  Temp(Src) 98 F (36.7 C) (Oral)  Wt 213 lb 3.2 oz (96.707 kg)  Alert and in no distress. Right lateral tongue with a 1 cm raised lesion, reddish and whitish area, no signs of infection, no drainage. Otherwise oral cavity without lesions.  Pharyngeal area is normal.     Assessment & Plan:  Tongue injury, initial encounter  Discussed with patient that I recommend using saltwater gargles and possibly Listerine rinses for the next few days and see if this will clear up. Also recommend eating slower and being more careful for the next few days trying to avoid reinjuring his tongue. No signs of infection. Audelia Acton, [place], also examined patient and in agreement with treatment plan. Recommend that he return in 2 weeks for reevaluation after giving this time to heal.

## 2015-04-07 ENCOUNTER — Ambulatory Visit (INDEPENDENT_AMBULATORY_CARE_PROVIDER_SITE_OTHER): Payer: 59 | Admitting: Family Medicine

## 2015-04-07 ENCOUNTER — Encounter: Payer: Self-pay | Admitting: Family Medicine

## 2015-04-07 VITALS — BP 124/74 | HR 64 | Temp 97.4°F | Wt 218.8 lb

## 2015-04-07 DIAGNOSIS — S0993XD Unspecified injury of face, subsequent encounter: Secondary | ICD-10-CM | POA: Diagnosis not present

## 2015-04-07 NOTE — Progress Notes (Signed)
   Subjective:    Patient ID: Zachary [name], male    DOB: Jan 09, 1960, 55 y.o.   MRN: [id number]  HPI Chief Complaint  Patient presents with  . still having tongue issue    a peice of tongue is hanging off of his tongue, painful   He is here with complaints of an area of tongue tissue hanging off the right side of his tongue since biting down hard on it last week. States he is having pain with eating and occasionally when speaking. He was seen last week by this provider, no infection. The area actually looks smaller today but small amount of tissue is protruding.  He states he has not been taking anything for symptoms and has not followed recommendations of salt water gargles.  Denies fever, chills.   Review of Systems Pertinent positives and negatives in the history of present illness.     Objective:   Physical Exam BP 124/74 mmHg  Pulse 64  Temp(Src) 97.4 F (36.3 C) (Oral)  Wt 218 lb 12.8 oz (99.247 kg)  Alert and in no distress. Pharyngeal area is normal. Neck is supple without adenopathy or thyromegaly. Tongue with a 0.5 cm tissue protruding from right lateral side, otherwise tongue is normal, no oral lesions.      Assessment & Plan:  Injury of tongue, subsequent encounter  Discussed that his tongue actually looks much better than last visit and no sign of infection but since he is having pain and issues with the extra tissue I recommend that he see an oral surgeon for further evaluation and treatment. Suspect scar tissue from trauma to tongue. Patient would prefer to call his dentist office and see what they think. I am ok with this and if he needs referral to oral surgeon he will call and let us know.

## 2015-12-05 ENCOUNTER — Telehealth: Payer: Self-pay | Admitting: Family Medicine

## 2015-12-05 NOTE — Telephone Encounter (Signed)
Left word for word message on pt machine 

## 2015-12-05 NOTE — Telephone Encounter (Signed)
Skin testing can potentially be done through an allergist but no need to truly do that since we have so many other options concerning antibiotics.

## 2015-12-05 NOTE — Telephone Encounter (Signed)
Pt wants to know if there is a test that he can have done to test him to see if he is indeed allergic to penicillin, states he was told as a a child that he was allergic and he wants to know for sure.  Please advise

## 2016-04-16 ENCOUNTER — Ambulatory Visit (INDEPENDENT_AMBULATORY_CARE_PROVIDER_SITE_OTHER): Payer: 59 | Admitting: Family Medicine

## 2016-04-16 ENCOUNTER — Encounter: Payer: Self-pay | Admitting: Family Medicine

## 2016-04-16 VITALS — BP 124/80 | HR 64 | Wt 228.0 lb

## 2016-04-16 DIAGNOSIS — R109 Unspecified abdominal pain: Secondary | ICD-10-CM

## 2016-04-16 NOTE — Progress Notes (Signed)
   Subjective:    Patient ID: Zachary [name], male    DOB: 1960/11/04, 56 y.o.   MRN: [id number]  HPI she complains of the relatively recent onset within the last couple weeks of left mid quadrant cramping sensation followed by a BM and then loose stools. He wonders whether this is work stress stress related. He cannot necessarily relate this to any particular foods. He does have a previous history of abdominal distress with previous colonoscopy which did show some erosions.  Review of Systems     Objective:   Physical Exam Alert and in no distress. Abdominal exam shows active bowel sounds without masses. He does complain of slight tenderness more so in the left mid quadrant area.       Assessment & Plan:  Abdominal cramping I explained that his symptoms could be related to gluten intolerance or possibly stress but at this point difficult to tell. He will keep a record of whether food plays a role with this or stress and let me know. May possibly need to be referred back to GI.

## 2016-05-25 ENCOUNTER — Encounter: Payer: Self-pay | Admitting: Medical

## 2016-05-25 ENCOUNTER — Ambulatory Visit (INDEPENDENT_AMBULATORY_CARE_PROVIDER_SITE_OTHER): Payer: 59 | Admitting: Medical

## 2016-05-25 VITALS — BP 110/80 | HR 65 | Wt 226.0 lb

## 2016-05-25 DIAGNOSIS — Q5529 Other congenital malformations of testis and scrotum: Secondary | ICD-10-CM

## 2016-05-25 DIAGNOSIS — N50819 Testicular pain, unspecified: Secondary | ICD-10-CM

## 2016-05-25 DIAGNOSIS — Z125 Encounter for screening for malignant neoplasm of prostate: Secondary | ICD-10-CM

## 2016-05-25 LAB — POCT URINALYSIS DIPSTICK
Bilirubin, UA: NEGATIVE
Glucose, UA: NEGATIVE
Ketones, UA: NEGATIVE
Leukocytes, UA: NEGATIVE
Nitrite, UA: NEGATIVE
PH UA: 5 (ref 5.0–8.0)
PROTEIN UA: NEGATIVE
RBC UA: NEGATIVE
SPEC GRAV UA: 1.01 (ref 1.010–1.025)
UROBILINOGEN UA: 0.2 U/dL

## 2016-05-25 LAB — CBC WITH DIFFERENTIAL/PLATELET
BASOS PCT: 1 %
Basophils Absolute: 77 cells/uL (ref 0–200)
EOS ABS: 231 {cells}/uL (ref 15–500)
Eosinophils Relative: 3 %
HCT: 43 % (ref 38.5–50.0)
Hemoglobin: 14.7 g/dL (ref 13.2–17.1)
Lymphocytes Relative: 25 %
Lymphs Abs: 1925 cells/uL (ref 850–3900)
MCH: 31.1 pg (ref 27.0–33.0)
MCHC: 34.2 g/dL (ref 32.0–36.0)
MCV: 91.1 fL (ref 80.0–100.0)
MONO ABS: 693 {cells}/uL (ref 200–950)
MONOS PCT: 9 %
MPV: 10.2 fL (ref 7.5–12.5)
Neutro Abs: 4774 cells/uL (ref 1500–7800)
Neutrophils Relative %: 62 %
PLATELETS: 306 10*3/uL (ref 140–400)
RBC: 4.72 MIL/uL (ref 4.20–5.80)
RDW: 13.6 % (ref 11.0–15.0)
WBC: 7.7 10*3/uL (ref 4.0–10.5)

## 2016-05-25 MED ORDER — CIPROFLOXACIN HCL 500 MG PO TABS
500.0000 mg | ORAL_TABLET | Freq: Two times a day (BID) | ORAL | 0 refills | Status: DC
Start: 2016-05-25 — End: 2016-07-31

## 2016-05-25 NOTE — Patient Instructions (Signed)
Recommendations:  Begin Cipro antibiotic twice daily for 7 days, possible more pending symptoms  Drink plenty of water  Consider briefs to raise the testicles a bit for comfort  You can use cool pack to testicles 20 minutes twice daily with cloth in between to prevent cold burn  You can use OTC ibuprofen or Tylenol  We will call back with lab results  We may end up getting an ultrasound of the testicles depending upon labs and antibiotic response    Orchitis Orchitis is swelling (inflammation) of a testicle caused by infection. Testicles are the male organs that produce sperm. The testicles are held in a fleshy sac (scrotum) located behind the penis. Orchitis usually affects only one testicle, but it can occur in both. The condition can develop suddenly. Orchitis can be caused by many different kinds of bacteria and viruses. What are the causes? Orchitis can be caused by either a bacterial or viral infection. Bacterial Infections   These often occur along with an infection of the coiled tube that collects sperm and sits on top of the testicle (epididymis).  In men who are not sexually active, bacterial orchitis usually starts as a urinary tract infection and spreads to the testicle.  In sexually active men, sexually transmitted infections are the most common cause of bacterial orchitis. These can include:  Gonorrhea.  Chlamydia. Viral Infections   Mumps is still the most common cause of viral orchitis, though mumps is now rare in many areas because of vaccination.  Other viruses that can cause orchitis include:  The chickenpox virus (varicella-zoster virus).  The virus that causes mononucleosis (Epstein-Barr virus). What increases the risk? Boys and men who have not been vaccinated against mumps are at risk for mumps orchitis. Risk factors for bacterial orchitis include:  Frequent urinary tract infections.  High-risk sexual behaviors.  Having a sexual partner with a  sexually transmitted infection.  Having had urinary tract surgery.  Using a tube passed through the penis to drain urine (Foley catheter).  An enlarged prostate gland. What are the signs or symptoms? The most common symptoms of orchitis are swelling and pain in the scrotum. Other signs and symptoms may include:  Feeling generally sick (malaise).  Fever and chills.  Painful urination.  Painful ejaculation.  Blood or discharge from the penis.  Nausea.  Headache.  Fatigue. How is this diagnosed? Your health care provider may suspect orchitis if you have a painful, swollen testicle along with other signs and symptoms of the condition. A physical exam will be done. Tests may also be done to help your health care provider make a diagnosis. These may include:  A blood test to check for signs of infection.  A urine test to check for a urinary tract infection.  Using a swab to collect a fluid sample from the tip of the penis to test for sexually transmitted infections.  Taking an image of the testicle using sound waves and a computer (testicular ultrasound). How is this treated? Treatment of orchitis depends on the cause. For orchitis caused by a bacterial infection, your health care provider will most likely prescribe antibiotic medicines. Bacterial infections usually clear up within a few days. Both viral infections and bacterial infections may be treated with:  Bed rest.  Anti-inflammatory medicines.  Pain medicines.  Elevating the scrotum and applying ice. Follow these instructions at home:  Rest as directed by your health care provider.  Take medicines only as directed by your health care provider.  If  you were prescribed an antibiotic medicine, finish it all even if you start to feel better.  Elevate your scrotum and apply ice as directed:  Put ice in a plastic bag.  Place a small towel or pillow between your legs.  Rest your scrotum on the pillow or  towel.  Place another towel between your skin and the plastic bag.  Leave the ice on for 20 minutes, 2-3 times a day. Contact a health care provider if:  You have a fever.  Pain and swelling have not gotten better after 3 days. Get help right away if:  Your pain is getting worse.  The swelling in your testicle gets worse. This information is not intended to replace advice given to you by your health care provider. Make sure you discuss any questions you have with your health care provider. Document Released: 12/16/1999 Document Revised: 05/26/2015 Document Reviewed: 05/07/2013 Elsevier Interactive Patient Education  2017 [employer].     Varicocele A varicocele is a swelling of veins in the scrotum. The scrotum is the sac that contains the testicles. Varicoceles can occur on either side of the scrotum, but they are more common on the left side. They occur most often in teenage boys and young men. In most cases, varicoceles are not a serious problem. They are usually small and painless and do not require treatment. Tests may be done to confirm the diagnosis. Treatment may be needed if:  A varicocele is large, causes a lot of pain, or causes pain when exercising.  Varicoceles are found on both sides of the scrotum.  The testicle on the opposite side is absent or not normal.  A varicocele causes a decrease in the size of the testicle in a growing adolescent.  The person has fertility problems. What are the causes? This condition is the result of valves in the veins not working properly. Valves in the veins help to return blood from the scrotum and testicles to the heart. If these valves do not work well, blood flows backward and backs up into the veins, which causes the veins to swell. This is similar to what happens when varicose veins form in the leg. What are the signs or symptoms? Most varicoceles do not cause any symptoms. If symptoms do occur, they may include:  Swelling  on one side of the scrotum. The swelling may be more obvious when you are standing up.  A lumpy feeling in the scrotum.  A heavy feeling on one side of the scrotum.  A dull ache in the scrotum, especially after exercise or prolonged standing or sitting.  Slower growth or reduced size of the testicle on the side of the varicocele (in young males).  Problems with fertility. These can occur if the testicle does not grow normally. How is this diagnosed? This condition may be diagnosed with a physical exam. You may also have an imaging test, called an ultrasound, to confirm the diagnosis and to help rule out other causes of the swelling. How is this treated? Treatment is usually not needed for this condition. If you have any pain, your health care provider may prescribe or recommend medicine to help relieve it. You may need regular exams so your health care provider can monitor the varicocele to ensure that it does not cause problems. When further treatment is needed, it may involve one of these options:  Varicocelectomy. This is a surgery in which the swollen veins are tied off so that the flow of blood goes  to other veins instead.  Embolization. In this procedure, a small tube (catheter) is used to place metal coils or other blocking items in the veins. This cuts off the blood flow to the swollen veins. Follow these instructions at home:  Take medicines only as directed by your health care provider.  Wear supportive underwear.  Use an athletic supporter for sports.  Keep all follow-up visits as directed by your health care provider. This is important. Contact a health care provider if:  Your pain is increasing.  You have redness in the affected area.  You have swelling that does not decrease when you are lying down.  One of your testicles is smaller than the other.  Your testicle becomes enlarged, swollen, or painful. This information is not intended to replace advice given to you  by your health care provider. Make sure you discuss any questions you have with your health care provider. Document Released: 03/26/2000 Document Revised: 06/01/2015 Document Reviewed: 11/25/2013 Elsevier Interactive Patient Education  2017 [employer].

## 2016-05-25 NOTE — Progress Notes (Signed)
Subjective: Chief Complaint  Patient presents with  . lump on testicle    notice a lump day ago, having pain    Here for left testicle pain, thought he noticed lump recently too. Has had this pain prior.   No penile discharge.   No burning with urination.   No polyuria.   Thinks he has some testicular shrinkage.  Left testicle has always been smaller than the other.  Celebrate last 5 years including masturbation.  No fever, no rectal pain, no bowel issues.  No hx/o prostate infection.   Not sure about prostate enlargement.   No urine stream changes, no nocturia.  Last STD testing years ago was fine.  No recent groin injury.   No other aggravating or relieving factors. No other complaint.  Past Medical History:  Diagnosis Date  . Allergy   . Anxiety   . Crohn disease ([place])    pt reports subsequent testing was normal  . Depression   . Herpes simplex   . IBS (irritable bowel syndrome)   . Incontinence    Current Outpatient Prescriptions on File Prior to Visit  Medication Sig Dispense Refill  . b complex vitamins capsule Take 1 capsule by mouth daily.    Marland Kitchen donepezil (ARICEPT) 10 MG tablet Take 10 mg by mouth at bedtime.    . ferrous sulfate 325 (65 FE) MG EC tablet Take 325 mg by mouth 3 (three) times daily with meals.    . fluvoxaMINE (LUVOX) 100 MG tablet Take 100 mg by mouth 2 (two) times daily. 1 tabs am and 3 tab qhs    . lamoTRIgine (LAMICTAL) 200 MG tablet Take 500 mg by mouth at bedtime. Take 1 and 1/2 tab daily Reported on 03/30/2015    . Multiple Vitamin (MULTIVITAMIN WITH MINERALS) TABS Take 1 tablet by mouth daily.    Marland Kitchen OLANZapine (ZYPREXA) 20 MG tablet Take 20 mg by mouth at bedtime.     No current facility-administered medications on file prior to visit.    ROS as in subjective  Objective: BP 110/80   Pulse 65   Wt 226 lb (102.5 kg)   SpO2 96%   BMI 28.25 kg/m   Gen: wd, wn, nad GU: normal male, circumcised, tender bilat testicles, both testicles feel softer than  normal, there are some small 74m sized papular or calcified feeling lesions of right superolateral testicle, otherwise no swelling, no discharge, no rash, no hernia, no lymphadenopathy DRE: deferred No extremity edema    Assessment: Encounter Diagnoses  Name Primary?  . Testicular pain Yes  . Testicular anomaly   . Screening for prostate cancer      Plan: Discussed possible differential.   Will treat empirically for orchitis, but will check labs as below, consider scrotal ultrasound.   Begin Cipro for at least a week, but call back within a week to let me know how he is feeling.  CHowiewas seen today for lump on testicle.  Diagnoses and all orders for this visit:  Testicular pain -     Urine culture -     CBC with Differential/Platelet -     Testosterone -     PSA -     Urinalysis Dipstick  Testicular anomaly -     Urine culture -     CBC with Differential/Platelet -     Testosterone -     PSA  Screening for prostate cancer -     Urine culture -  CBC with Differential/Platelet -     Testosterone -     PSA  Other orders -     ciprofloxacin (CIPRO) 500 MG tablet; Take 1 tablet (500 mg total) by mouth 2 (two) times daily.

## 2016-05-26 LAB — PSA: PSA: 0.5 ng/mL (ref <=4.0)

## 2016-05-26 LAB — URINE CULTURE: Organism ID, Bacteria: NO GROWTH

## 2016-05-26 LAB — TESTOSTERONE: Testosterone: 481 ng/dL (ref 250–827)

## 2016-05-30 ENCOUNTER — Other Ambulatory Visit: Payer: Self-pay | Admitting: Medical

## 2016-05-30 DIAGNOSIS — N50819 Testicular pain, unspecified: Secondary | ICD-10-CM

## 2016-05-30 DIAGNOSIS — N5089 Other specified disorders of the male genital organs: Secondary | ICD-10-CM

## 2016-06-15 ENCOUNTER — Other Ambulatory Visit: Payer: Self-pay | Admitting: Family Medicine

## 2016-06-18 ENCOUNTER — Other Ambulatory Visit: Payer: Self-pay

## 2016-06-18 DIAGNOSIS — N50819 Testicular pain, unspecified: Secondary | ICD-10-CM

## 2016-07-31 ENCOUNTER — Encounter: Payer: Self-pay | Admitting: Family Medicine

## 2016-07-31 ENCOUNTER — Ambulatory Visit (INDEPENDENT_AMBULATORY_CARE_PROVIDER_SITE_OTHER): Payer: 59 | Admitting: Family Medicine

## 2016-07-31 VITALS — BP 130/82 | HR 66 | Wt 228.0 lb

## 2016-07-31 DIAGNOSIS — T1592XA Foreign body on external eye, part unspecified, left eye, initial encounter: Secondary | ICD-10-CM | POA: Diagnosis not present

## 2016-07-31 NOTE — Progress Notes (Signed)
   Subjective:    Patient ID: Zachary [name], male    DOB: [birth year], 56 y.o.   MRN: [id number]  HPI He states that he feels as if he has a foreign body in his left eye since this morning.   Review of Systems     Objective:   Physical Exam exam of the left eye does show 2 small foreign bodies in the eye mid cornea.      Assessment & Plan:  Foreign body of left eye, initial encounter - Plan: Ambulatory referral to Ophthalmology The eye was anesthetized however the lesions could not be removed. He will be referred to ophthalmology.

## 2016-08-27 ENCOUNTER — Ambulatory Visit (INDEPENDENT_AMBULATORY_CARE_PROVIDER_SITE_OTHER): Payer: 59 | Admitting: Family Medicine

## 2016-08-27 ENCOUNTER — Encounter: Payer: Self-pay | Admitting: Family Medicine

## 2016-08-27 VITALS — BP 120/80 | HR 70 | Temp 97.9°F | Wt 228.0 lb

## 2016-08-27 DIAGNOSIS — M545 Low back pain, unspecified: Secondary | ICD-10-CM

## 2016-08-27 LAB — POCT URINALYSIS DIP (PROADVANTAGE DEVICE)
BILIRUBIN UA: NEGATIVE
BILIRUBIN UA: NEGATIVE mg/dL
GLUCOSE UA: NEGATIVE mg/dL
Leukocytes, UA: NEGATIVE
Nitrite, UA: NEGATIVE
Protein Ur, POC: NEGATIVE mg/dL
RBC UA: NEGATIVE
SPECIFIC GRAVITY, URINE: 1.01
Urobilinogen, Ur: NEGATIVE
pH, UA: 6 (ref 5.0–8.0)

## 2016-08-27 NOTE — Progress Notes (Signed)
   Subjective:    Patient ID: Zachary [name], male    DOB: 03/08/1960, 56 y.o.   MRN: [id number]  HPI Chief Complaint  Patient presents with  . Back Pain    low back pain left side x 1 week   He is here with complaints of a 1 week history of intermittent left low back pain that started spontaneously. Denies injury or history of back surgeries. Pain is non radiating. Pain is worse with certain movements. Pain is relieved at rest. Denies pain while sleeping. No associated symptoms.   States he has not tried anything at home for treatment.   Denies fever, chills,  unexplained weight loss, fatigue, chest pain, palpitations, shortness of breath, abdominal pain, N/V/D, urinary symptoms.  Denies numbness, tingling or weakness.   Reports history of right calf muscle "vibrating" occasionally. States he can see it but does feel any abnormal sensations or cramping. This has been ongoing for months without worsening or any new symptoms.   States he does not have crohn's disease. States he took a new test 2 or 3 years ago that showed that he did not have this.   Reviewed allergies, medications, past medical, surgical,  and social history.    Review of Systems Pertinent positives and negatives in the history of present illness.     Objective:   Physical Exam  Constitutional: He is oriented to person, place, and time. He appears well-developed and well-nourished. No distress.  HENT:  Mouth/Throat: Oropharynx is clear and moist.  Eyes: Conjunctivae are normal.  Neck: Normal range of motion. Neck supple.  Cardiovascular: Normal rate, regular rhythm, normal heart sounds and intact distal pulses.   Pulmonary/Chest: Effort normal and breath sounds normal.  Abdominal: Soft. Bowel sounds are normal. He exhibits no distension. There is no tenderness.  Musculoskeletal:       Left hip: Normal.       Lumbar back: He exhibits pain. He exhibits normal range of motion, no tenderness, no swelling and  no spasm.  Pain with right rotation and right lateral bending.  Negative straight leg test.   Neurological: He is alert and oriented to person, place, and time. He has normal strength and normal reflexes. No cranial nerve deficit. Coordination and gait normal.  Skin: Skin is warm and dry. No rash noted. He is not diaphoretic. No pallor.   BP 120/80   Pulse 70   Temp 97.9 F (36.6 C)   Wt 228 lb (103.4 kg)   SpO2 96%   BMI 28.50 kg/m        Assessment & Plan:  Acute left-sided low back pain without sciatica - Plan: POCT Urinalysis DIP (Proadvantage Device)  UA is negative  Discussed that his symptoms appear to be related to a musculoskeletal etiology and I recommend conservative treatment at this time. 2 Aleve twice daily with food and heat. No sign of an infectious process or neurological issue.  Advised to report back any new or worsening symptoms.

## 2016-08-27 NOTE — Patient Instructions (Signed)
Try 2 Aleve twice daily with food and use heat to the area 20 minutes at a time. If your pain gets worse or you develop any new symptoms then let me know.

## 2016-08-30 ENCOUNTER — Encounter: Payer: Self-pay | Admitting: Family Medicine

## 2016-08-30 ENCOUNTER — Ambulatory Visit (INDEPENDENT_AMBULATORY_CARE_PROVIDER_SITE_OTHER): Payer: 59 | Admitting: Family Medicine

## 2016-08-30 VITALS — BP 120/70 | HR 72 | Wt 229.0 lb

## 2016-08-30 DIAGNOSIS — M545 Low back pain, unspecified: Secondary | ICD-10-CM

## 2016-08-30 NOTE — Progress Notes (Signed)
   Subjective:    Patient ID: Zachary [name], male    DOB: 1960/11/29, 56 y.o.   MRN: [id number]  HPI He is here for recheck on his back pain. He was seen several days ago. That record was reviewed. He states that he had the onset of spontaneous left sided back pain that is worse with motion. He does note occasionally radiating into the posterior thigh area. He also then mentioned occasional testicular discomfort with that. He has been using 2 ibuprofen twice per day. Record indicates that he is supposed be taking 2 Aleve twice per day.  Review of Systems     Objective:   Physical Exam He points to the left PSIS asked where he is having discomfort. No tenderness over SI joint. Good motion of his back. Negative straight leg raising normal DTRs. Testes do show bilateral atrophy with asymmetry in the size but no other lesions were noted.       Assessment & Plan:  Acute left-sided low back pain without sciatica I explained that I did not find anything of major significance. Recommended 2 Aleve twice per day as opposed to ibuprofen, heat to the area. Time this will dissipate. Explained that the testes were normal other than the asymmetry but apparently this is his norm.

## 2016-08-30 NOTE — Patient Instructions (Signed)
Take 2 Aleve twice per day. Heat to the area for 20 minutes couple times per day. Time

## 2016-10-02 ENCOUNTER — Ambulatory Visit (INDEPENDENT_AMBULATORY_CARE_PROVIDER_SITE_OTHER): Payer: 59 | Admitting: Family Medicine

## 2016-10-02 VITALS — BP 136/70 | HR 76 | Wt 226.8 lb

## 2016-10-02 DIAGNOSIS — R151 Fecal smearing: Secondary | ICD-10-CM

## 2016-10-02 NOTE — Progress Notes (Signed)
   Subjective:    Patient ID: Zachary [name], male    DOB: 10-22-1960, 56 y.o.   MRN: [id number]  HPI He is here for consult concerning 2 episodes of soiling that occurred last week. He had the urge to have a BM and did have slight soiling. He states that in the past he would have this occurred 2 or 3 times in the year but never that quickly. He does state that he has been taking a dietary supplement with selenium and it and thinks this might be contributed to it. In the past he has had difficulty with excessive flatulence   Review of Systems     Objective:   Physical Exam Alert and in no distress otherwise not examined       Assessment & Plan:  Soiling Discussed possible options including being seen by GI but at this point I will have him stop the selenium and if continued difficulty, refer to GI.

## 2017-05-01 ENCOUNTER — Encounter: Payer: Self-pay | Admitting: Psychology

## 2017-05-01 ENCOUNTER — Telehealth: Payer: Self-pay | Admitting: Psychology

## 2017-05-01 NOTE — Telephone Encounter (Signed)
Patient has appt sch with you and would like a call back with the codes so he may call the insurance company with them to see if the are covered

## 2017-05-02 NOTE — Telephone Encounter (Signed)
CPT codes billed by neuropsychology typically include:  [phone] [phone]

## 2017-05-06 ENCOUNTER — Ambulatory Visit: Payer: 59 | Admitting: Family Medicine

## 2017-05-06 ENCOUNTER — Encounter: Payer: Self-pay | Admitting: Family Medicine

## 2017-05-06 VITALS — BP 120/82 | HR 75 | Temp 97.7°F | Ht 73.0 in | Wt 229.6 lb

## 2017-05-06 DIAGNOSIS — R6884 Jaw pain: Secondary | ICD-10-CM | POA: Diagnosis not present

## 2017-05-06 DIAGNOSIS — M549 Dorsalgia, unspecified: Secondary | ICD-10-CM | POA: Diagnosis not present

## 2017-05-06 NOTE — Progress Notes (Signed)
   Subjective:    Patient ID: Zachary [name], male    DOB: Mar 05, 1960, 57 y.o.   MRN: [id number]  HPI He has a 1 day history of right jaw pain that only bothers him when he touches that area.  No trouble with eating, earache, sore throat, pain or swelling in the neck. He also has a 1 day history of left upper scapular type pain is made worse with taking a deep breath or motion.  No history of injury, shortness of breath, cough or congestion   Review of Systems     Objective:   Physical Exam Alert and in no distress he points to the masseter muscle as to where the pain is coming from but no palpable lesions are noted.  TMJ is nontender.  Throat is clear.  Neck is supple without adenopathy.  Exam of the upper back area shows no lesions.  No tenderness to palpation.  Lungs are clear to auscultation.  Good motion.       Assessment & Plan:  Jaw pain, non-TMJ  Upper back pain on left side I explained that both of these are benign in nature and recommended conservative care for them.  He was comfortable with that.

## 2017-09-09 ENCOUNTER — Encounter: Payer: Self-pay | Admitting: Family Medicine

## 2017-09-09 ENCOUNTER — Ambulatory Visit: Payer: 59 | Admitting: Family Medicine

## 2017-09-09 VITALS — BP 122/80 | HR 77 | Temp 98.1°F | Resp 16 | Wt 239.4 lb

## 2017-09-09 DIAGNOSIS — H9201 Otalgia, right ear: Secondary | ICD-10-CM

## 2017-09-09 DIAGNOSIS — J029 Acute pharyngitis, unspecified: Secondary | ICD-10-CM

## 2017-09-09 LAB — POCT RAPID STREP A (OFFICE): Rapid Strep A Screen: NEGATIVE

## 2017-09-09 NOTE — Progress Notes (Signed)
Chief Complaint  Patient presents with  . sore throat.    sore throat. woke up with it this morning. ear pain when swallowing. and cough. all started this morning    Subjective:  Zachary [name] is a 57 y.o. male who presents for sore throat, right ear pain with swallowing, post nasal drainage and mild dry cough. States his symptoms started upon awakening this morning. He felt fine last night.   Denies fever, chills, headache, dizziness, rhinorrhea, nasal congestion, chest pain, palpitations, shortness of breath, wheezing, abdominal pain, N/V/D, LE edema.   Does not smoke. No recent antibiotics.   Treatment to date: none.  Denies sick contacts.  No other aggravating or relieving factors.  No other c/o.  ROS as in subjective.   Objective: Vitals:   09/09/17 1057  BP: 122/80  Pulse: 77  Resp: 16  Temp: 98.1 F (36.7 C)  SpO2: 98%    General appearance: Alert, WD/WN, no distress, mildly ill appearing                             Skin: warm, no rash                           Head: no sinus tenderness                            Eyes: conjunctiva normal, corneas clear, PERRLA                            Ears: pearly TMs, external ear canals normal                          Nose: septum midline, turbinates swollen, with erythema, no discharge             Mouth/throat: MMM, tongue normal, mild pharyngeal erythema, no exudate or edema                           Neck: supple, no adenopathy, no thyromegaly, nontender                          Heart: RRR, normal S1, S2, no murmurs                         Lungs: CTA bilaterally, no wheezes, rales, or rhonchi      Assessment: Acute pharyngitis, unspecified etiology  Sore throat - Plan: POCT rapid strep A  Right ear pain    Plan: Rapid strep is negative.  Discussed that his symptoms are most likely related to a viral etiology.  We will do supportive care for now.  Suggested symptomatic OTC remedies.  Advised to do salt water  gargles and take Tylenol or ibuprofen.  He will stay well-hydrated.  Follow-up if his symptoms are not improving over the next 3 to 4 days or if he is severely worsening.

## 2017-09-09 NOTE — Patient Instructions (Signed)
  Your strep test is negative.   I suspect your symptoms are related to a viral illness and recommend treating your symptoms at this point.  Do salt water gargles and take Tylenol or ibuprofen for throat pain.  Mucinex DM for congestion and cough, drink extra water.  Call if you are not improving by days 7-10 of your illness or if you develop fever, wheezing or worsening symptoms.

## 2017-09-10 ENCOUNTER — Ambulatory Visit (INDEPENDENT_AMBULATORY_CARE_PROVIDER_SITE_OTHER): Payer: 59 | Admitting: Psychology

## 2017-09-10 ENCOUNTER — Encounter: Payer: Self-pay | Admitting: Psychology

## 2017-09-10 DIAGNOSIS — F419 Anxiety disorder, unspecified: Secondary | ICD-10-CM

## 2017-09-10 DIAGNOSIS — R413 Other amnesia: Secondary | ICD-10-CM | POA: Diagnosis not present

## 2017-09-10 DIAGNOSIS — F329 Major depressive disorder, single episode, unspecified: Secondary | ICD-10-CM

## 2017-09-10 NOTE — Progress Notes (Signed)
NEUROBEHAVIORAL STATUS EXAM   Name: Zachary [name] Date of Birth: [birth year] Date of Interview: 09/10/2017  Reason for Referral:  Zachary [name] is a 57 y.o. male who is referred for neuropsychological evaluation by Dr. Lynder Parents of [facility] due to concerns about subjective cognitive complaints. This [name] is unaccompanied in Zachary office for today's visit.  History of Presenting Problem:  Zachary [name] is followed by Dr. Clovis Pu for psychiatric care. He has been treated by Dr. 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 Dr. Clovis Pu noted that more recently Zachary [name] has had obsessive thoughts about being unfit to drive vehicles at work, despite never having had any MVAs or complaints from his boss about his driving. Per Dr. Casimiro Needle note, he has also complained that his memory and focus are very poor on and off for several years. At times, he has been convinced that he is going to be fired because of mistakes at work but there has been no evidence objectively that his job is in jeopardy. Dr. Clovis Pu requested objective evaluation of Zachary [name]'s cognitive status to determine whether subjective cognitive complaints are neurological versus psychiatric.  At today's visit (09/10/2017), Zachary [name] reports that he has always had a hard time learning new jobs but he has had even more difficulty with his current job. 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 He reports if he is given information he won't retain it. For example, he states that someone told him to go tell a person to go to a certain location, and he walked about 100 feet and then forgot what he was supposed to say to Zachary person. He also reported that sometimes people will be talking to him and he will have no idea what they are saying. He is concerned he may have dementia. He has no  known family history of dementia, although his great grandmother may have had dementia. Zachary [name] thinks his cognitive symptoms are worsening over time.   Upon direct questioning, Zachary [name] reported Zachary following with regard to current cognitive functioning:  Forgetting recent conversations/events: Yes Repeating statements/questions: No Misplacing/losing items: No Forgetting appointments or other obligations: No Forgetting to take medications: No Difficulty concentrating: Yes Starting but not finishing tasks: Yes, but that is just Zachary nature of my job, it's not a problem with me Processing information more slowly: No Word-finding difficulty: Occasionally Comprehension difficulty: Yes Getting lost when driving: No Making wrong turns when driving: No Uncertain about directions when driving or passenger: Not if it is a place I have been before  Zachary [name] lives alone. He works full time. He manages all complex ADLs independently including driving, medications, appointments, finances and meals.   He denies any significant medical issues or physical complaints. He denies history of stroke, heart attack, head injury or seizure. He denies difficulty walking or with balance. He does not have any sleep difficulty.   He reports his current mood is "okay", stable. He notes he takes medication for depression and it "is definitely helping". He admits to a history of suicidal ideation in Zachary past but this has not been an issue since his medications were stabilized. He endorses ongoing anxiety characterized by worry and feeling anxious. He denies history of inpatient psychiatric hospitalization. He denies history of addiction to any substance/drug.    Social History: Born/Raised: [place] He has lived in [place]  since 1982 Education: Forensic psychologist ("I knew what I had to do to keep a B average, but I didn't really go for anything better than that"). No hx learning difficulties. Degree in  education. Occupational history: He reports he has done several things. 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  Marital history: Single, never married, no children. He reports he has friends he spends time with. He reports he likes his coworkers. Alcohol: None Tobacco: None Recreational drug use: None currently   Medical History: Past Medical History:  Diagnosis Date  . Allergy   . Anxiety   . Crohn disease ([place])    pt reports subsequent testing was normal  . Depression   . Herpes simplex   . IBS (irritable bowel syndrome)   . Incontinence      Current Medications:  Outpatient Encounter Medications as of 09/10/2017  Medication Sig  . Acetylcysteine (N-ACETYL-L-CYSTEINE) 600 MG CAPS Take by mouth.  . ALPRAZolam (XANAX PO) Take 1 tablet by mouth.  Marland Kitchen b complex vitamins capsule Take 1 capsule by mouth daily.  . busPIRone (BUSPAR) 30 MG tablet Take 30 mg by mouth 2 (two) times daily.  Marland Kitchen donepezil (ARICEPT) 10 MG tablet Take 10 mg by mouth at bedtime.  . ferrous sulfate 325 (65 FE) MG EC tablet Take 325 mg by mouth 3 (three) times daily with meals.  . fluvoxaMINE (LUVOX) 100 MG tablet Take 100 mg by mouth. 1 tabs am and 3 tab qhs  . lamoTRIgine (LAMICTAL) 200 MG tablet Take 500 mg by mouth at bedtime.   Marland Kitchen lithium carbonate 150 MG capsule Take 150 mg by mouth at bedtime.  . Multiple Vitamin (MULTIVITAMIN WITH MINERALS) TABS Take 1 tablet by mouth daily.  Marland Kitchen OLANZapine (ZYPREXA) 20 MG tablet Take 20 mg by mouth at bedtime.   No facility-administered encounter medications on file as of 09/10/2017.      Behavioral Observations:   Appearance: Neatly, casually and appropriately dressed and groomed Gait: Ambulated independently, no gross abnormalities observed Speech: Fluent; normal rate, rhythm and volume. Mild word finding difficulty. Thought process: Linear, goal directed Affect: Mildly blunted Interpersonal: Very pleasant, appropriate   30  minutes spent face-to-face with [name] completing neurobehavioral status exam. 20 minutes spent integrating medical records/clinical data and completing this report. CPT code [id number] unit.   TESTING: There is medical necessity to proceed with neuropsychological assessment as Zachary results will be used to aid in differential diagnosis and clinical decision-making and to inform specific treatment recommendations. Per Zachary [name] and medical records reviewed, there has been a change in cognitive functioning and a reasonable suspicion of neurocognitive disorder.  Clinical Decision Making: In considering Zachary [name]'s current level of functioning, level of presumed impairment, nature of symptoms, emotional and behavioral responses during Zachary interview, level of literacy, and observed level of motivation, a battery of tests was selected and communicated to Zachary psychometrician.     PLAN: Zachary [name] will return on 09/17/2017 to complete Zachary above referenced full battery of neuropsychological testing with a psychometrician under my supervision. Education regarding testing procedures was provided to Zachary [name]. Subsequently, on 09/24/2017, Zachary [name] will see this provider for a follow-up session at which time his test performances and my impressions and treatment recommendations will be reviewed in detail.  Evaluation ongoing; full report to follow.

## 2017-09-16 ENCOUNTER — Ambulatory Visit: Payer: 59 | Admitting: Family Medicine

## 2017-09-16 ENCOUNTER — Encounter: Payer: Self-pay | Admitting: Family Medicine

## 2017-09-16 ENCOUNTER — Telehealth: Payer: Self-pay

## 2017-09-16 VITALS — BP 136/88 | HR 82 | Temp 98.0°F | Wt 242.6 lb

## 2017-09-16 DIAGNOSIS — J209 Acute bronchitis, unspecified: Secondary | ICD-10-CM

## 2017-09-16 MED ORDER — CLARITHROMYCIN 500 MG PO TABS: 500.0000 mg | ORAL_TABLET | Freq: Two times a day (BID) | ORAL | 0 refills | Status: DC

## 2017-09-16 NOTE — Telephone Encounter (Signed)
Pt called to make sure that he was not allergic to ABX . Pt was advise if he started feeling bad to contact office. Per dr. Redmond School this medication is fine . [place]

## 2017-09-16 NOTE — Progress Notes (Signed)
   Subjective:    Patient ID: Zachary [name], male    DOB: [birth year], 57 y.o.   MRN: [id number]  HPI He complains of a one-week history of sore throat followed by nasal congestion, cough that has become productive, PND, fatigue and malaise.  He was seen last week and evaluated for sore throat which was negative.  He does not smoke.  Has no underlying allergies.   Review of Systems     Objective:   Physical Exam Alert and in no distress. Tympanic membranes and canals are normal. Pharyngeal area is normal. Neck is supple without adenopathy or thyromegaly. Cardiac exam shows a regular sinus rhythm without murmurs or gallops. Lungs are clear to auscultation.        Assessment & Plan:  Acute bronchitis, unspecified organism - Plan: clarithromycin (BIAXIN) 500 MG tablet He is to call if not entirely better when he finishes the antibiotic

## 2017-09-24 ENCOUNTER — Encounter: Payer: BLUE CROSS/BLUE SHIELD | Admitting: Psychology

## 2017-09-26 ENCOUNTER — Encounter: Payer: 59 | Admitting: Psychology

## 2017-10-03 ENCOUNTER — Other Ambulatory Visit: Payer: Self-pay

## 2017-10-03 MED ORDER — BUSPIRONE HCL 30 MG PO TABS: 30.0000 mg | ORAL_TABLET | Freq: Two times a day (BID) | ORAL | 0 refills | Status: DC

## 2017-10-03 NOTE — Progress Notes (Signed)
Sent one month supply of buspirone 58m to pharmacy

## 2017-10-09 ENCOUNTER — Ambulatory Visit: Payer: 59 | Admitting: Psychology

## 2017-10-09 ENCOUNTER — Encounter: Payer: Self-pay | Admitting: Psychology

## 2017-10-09 DIAGNOSIS — R413 Other amnesia: Secondary | ICD-10-CM

## 2017-10-09 NOTE — Progress Notes (Signed)
   Neuropsychology Note  Zachary [name] completed 150 minutes of neuropsychological testing with technician, Milana Kidney, BS, under the supervision of Dr. Macarthur Critchley, Licensed Psychologist. The patient did not appear overtly distressed by the testing session, per behavioral observation or via self-report to the technician. Rest breaks were offered.   Clinical Decision Making: In considering the patient's current level of functioning, level of presumed impairment, nature of symptoms, emotional and behavioral responses during the interview, level of literacy, and observed level of motivation/effort, a battery of tests was selected and communicated to the psychometrician.  Communication between the psychologist and technician was ongoing throughout the testing session and changes were made as deemed necessary based on patient performance on testing, technician observations and additional pertinent factors such as those listed above.  Zachary [name] will return within approximately 2 weeks for an interactive feedback session with Dr. Si Raider at which time his test performances, clinical impressions and treatment recommendations will be reviewed in detail. The patient understands he can contact our office should he require our assistance before this time.  35 minutes spent performing neuropsychological evaluation services/clinical decision making (psychologist). [CPT 90931] 150 minutes spent face-to-face with patient administering standardized tests, 60 minutes spent scoring (technician). [CPT [id number], 12162]  Full report to follow.

## 2017-10-10 DIAGNOSIS — F4010 Social phobia, unspecified: Secondary | ICD-10-CM | POA: Insufficient documentation

## 2017-10-10 DIAGNOSIS — F429 Obsessive-compulsive disorder, unspecified: Secondary | ICD-10-CM

## 2017-10-15 NOTE — Progress Notes (Signed)
NEUROPSYCHOLOGICAL EVALUATION   Name:    Zachary [name]  Date of Birth:   [birth year] Date of Interview:  09/10/2017 Date of Testing:  10/09/2017   Date of Feedback:  10/17/2017       Background Information:  Reason for Referral:  Zachary [name] is a 57 y.o. male referred by Dr. Lynder Parents of [facility] to assess his current level of cognitive functioning and assist in differential diagnosis. The current evaluation consisted of a review of available medical records, an interview with the patient, and the completion of a neuropsychological testing battery. Informed consent was obtained.  History of Presenting Problem:  Mr. Zachary [name] is followed by Dr. Clovis Pu for psychiatric care. He has been treated by Dr. 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 Dr. Clovis Pu noted that more recently the patient has had obsessive thoughts about being unfit to drive vehicles at work, despite never having had any MVAs or complaints from his boss about his driving. Per Dr. Casimiro Needle note, he has also complained that his memory and focus are very poor on and off for several years. At times, he has been convinced that he is going to be fired because of mistakes at work but there has been no evidence objectively that his job is in jeopardy. Dr. Clovis Pu requested objective evaluation of the patient's cognitive status to determine whether subjective cognitive complaints are neurological versus psychiatric.  At today's visit (09/10/2017), the patient reports that he has always had a hard time learning new jobs but he has had even more difficulty with his current job. 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 He reports if he is given information he won't retain it. For example, he states that someone told him to go tell a person to go to a certain location, and he  walked about 100 feet and then forgot what he was supposed to say to the person. He also reported that sometimes people will be talking to him and he will have no idea what they are saying. He is concerned he may have dementia. He has no known family history of dementia, although his great grandmother may have had dementia. The patient thinks his cognitive symptoms are worsening over time.   Upon direct questioning, the patient reported the following with regard to current cognitive functioning:  Forgetting recent conversations/events: Yes Repeating statements/questions: No Misplacing/losing items: No Forgetting appointments or other obligations: No Forgetting to take medications: No Difficulty concentrating: Yes Starting but not finishing tasks: Yes, but that is just the nature of my job, it's not a problem with me Processing information more slowly: No Word-finding difficulty: Occasionally Comprehension difficulty: Yes Getting lost when driving: No Making wrong turns when driving: No Uncertain about directions when driving or passenger: Not if it is a place I have been before  The patient lives alone. He works full time. He manages all complex ADLs independently including driving, medications, appointments, finances and meals.   He denies any significant medical issues or physical complaints. He denies history of stroke, heart attack, head injury or seizure. He denies difficulty walking or with balance. He does not have any sleep difficulty.   He reports his current mood is "okay", stable. He notes he takes medication for depression and it "is definitely helping". He admits to a history of suicidal ideation in the past but this has not  been an issue since his medications were stabilized. He endorses ongoing anxiety characterized by worry and feeling anxious. He denies history of inpatient psychiatric hospitalization. He denies history of addiction to any substance/drug.    Social  History: Born/Raised: [place] He has lived in [place] since [place]: Forensic psychologist ("I knew what I had to do to keep a B average, but I didn't really go for anything better than that"). No hx learning difficulties. Degree in education. Occupational history: He reports he has done several things. He has been a receiving clerk for the same company for the past 13 years.  Marital history: Single, never married, no children. He reports he has friends he spends time with. He reports he likes his coworkers. Alcohol: None Tobacco: None Recreational drug use: None currently   Medical History:  Past Medical History:  Diagnosis Date  . Allergy   . Anxiety   . Crohn disease ([place])    pt reports subsequent testing was normal  . Depression   . Herpes simplex   . IBS (irritable bowel syndrome)   . Incontinence     Current medications:  Outpatient Encounter Medications as of 10/17/2017  Medication Sig  . Acetylcysteine (N-ACETYL-L-CYSTEINE) 600 MG CAPS Take by mouth.  . ALPRAZolam (XANAX PO) Take 1 tablet by mouth.  Marland Kitchen b complex vitamins capsule Take 1 capsule by mouth daily.  . busPIRone (BUSPAR) 30 MG tablet Take 1 tablet (30 mg total) by mouth 2 (two) times daily.  . clarithromycin (BIAXIN) 500 MG tablet Take 1 tablet (500 mg total) by mouth 2 (two) times daily.  Marland Kitchen donepezil (ARICEPT) 10 MG tablet Take 10 mg by mouth at bedtime.  . ferrous sulfate 325 (65 FE) MG EC tablet Take 325 mg by mouth 3 (three) times daily with meals.  . fluvoxaMINE (LUVOX) 100 MG tablet Take 100 mg by mouth. 1 tabs am and 3 tab qhs  . lamoTRIgine (LAMICTAL) 200 MG tablet Take 500 mg by mouth at bedtime. 1 1/2 qd  . lithium carbonate 150 MG capsule Take 150 mg by mouth at bedtime.  . Multiple Vitamin (MULTIVITAMIN WITH MINERALS) TABS Take 1 tablet by mouth daily.  Marland Kitchen OLANZapine (ZYPREXA) 20 MG tablet Take 20 mg by mouth at bedtime.   No facility-administered encounter medications on file as of 10/17/2017.       Current Examination:  Behavioral Observations:  Appearance: Neatly, casually and appropriately dressed and groomed Gait: Ambulated independently, no gross abnormalities observed Speech: Fluent; normal rate, rhythm and volume. Mild word finding difficulty. Thought process: Linear, goal directed Affect: Mildly blunted Interpersonal: Very pleasant, appropriate Orientation: Oriented to all spheres. Accurately named the current President and his predecessor.   Tests Administered: . Test of Premorbid Functioning (TOPF) . Wechsler Adult Intelligence Scale-Fourth Edition (WAIS-IV): Similarities, Block Design, Matrix Reasoning, Arithmetic, Information, Symbol Search, Coding and Digit Span subtests . Wechsler Memory Scale-Fourth Edition (WMS-IV) Adult Version (ages 68-69): Logical Memory I, II and Recognition subtests  . [place] Verbal Learning Test - 2nd Edition (CVLT-2) Standard Form . [employer] (WCST) . Repeatable Battery for the Assessment of Neuropsychological Status (RBANS) Form A:  Figure Copy and Recall Subtests . [place] (BNT) . Controlled Oral Word Association Test (COWAT) . Trail Making Test A and B . Boston Diagnostic Aphasia Examination: Complex Ideational Material . Clock Drawing Test . Beck Depression Inventory - Second edition (BDI-II) . Generalized Anxiety Disorder - 7 item screener (GAD-7) . Personality Assessment Inventory (PAI)  Test  Results: Note: Standardized scores are presented only for use by appropriately trained professionals and to allow for any future test-retest comparison. These scores should not be interpreted without consideration of all the information that is contained in the rest of the report. The most recent standardization samples from the test publisher or other sources were used whenever possible to derive standard scores; scores were corrected for age, gender, ethnicity and education when available.   Test  Scores:  Test Name Raw Score Standardized Score Descriptor  TOPF 55/70 SS= 112 High average  WAIS-IV Subtests     Similarities 26/36 ss= 10 Average  Block Design 42/66 ss= 11 Average  Matrix Reasoning 17/26 ss= 11 Average  Arithmetic 15/22 ss= 11 Average  Information 16/26 ss= 11 Average  Symbol Search 24/60 ss= 8 Low end of average  Coding 45/135 ss= 7 Low average  Digit Span 22/48 ss= 8 Low end of average  WAIS-IV Index Scores     Verbal Comprehension  SS= 103 Average  Perceptual Reasoning  SS= 105 Average  Working Memory  SS= 97 Average  Processing Speed  SS= 86 Low average  Full Scale IQ (8 subtest)  SS= 97 Average  WMS-IV Subtests     LM I 23/50 ss= 9 Average  LM II 15/50 ss= 7 Low average  LM II Recognition 17/30 Cum %: <2 Impaired  CVLT-II Scores     Trial 1 4/16 Z= -1.5 Borderline  Trial 5 11/16 Z= 0 Average  Trials 1-5 total 39/80 T= 45 Average  SD Free Recall 6/16 Z= -1 Low average  SD Cued Recall 7/16 Z= -1 Low average  LD Free Recall 6/16 Z= -1 Low average  LD Cued Recall 8/16 Z= -1 Low average  Recognition Discriminability 13/16 hits 7 false positives Z= -1 Low average  Forced Choice Recognition 16/16  WNL  WCST     Total Errors 10 T= 55 Average  Perseverative Responses 5 T= 55 Average  Perseverative Errors 5 T= 49 Average  Conceptual Level Responses 52 T= 52 Average  Categories Completed 4 >16% WNL  Trials to Complete 1st Category 12 >16% WNL  Failure to Maintain Set 0  WNL  RBANS Subtests     Figure Copy 20/20 Z= 1.3 High average  Figure Recall 13/20 Z= -0.2 Average  BNT 55/60 T= 49 Average  COWAT-FAS 27 T= 34 Borderline  COWAT-Animals 18 T= 43 Average  Trail Making Test A  46" 0 errors T= 40 Low average  Trail Making Test B  83" 0 errors T= 48 Average  BDAE Subtest     Complex Ideational Material 12/12  WNL  Clock Drawing Test   Mildly impaired  BDI-II 11/63  WNL  GAD-7 3/21  WNL  PAI ICN scale   T= 73  (Invalid)     Description of  Test Results:  Embedded performance validity indicators were within normal limits. As such, the patient's current performance on neurocognitive testing is judged to be a relatively accurate representation of his current level of neurocognitive functioning.   Premorbid verbal intellectual abilities were estimated to have been within the high average range based on a test of word reading. Current full scale IQ fell within the average range.   Psychomotor processing speed was low average.   Auditory attention and working memory were average.   Visual-spatial construction was average to high average.   Language abilities were within normal limits. Specifically, confrontation naming was average, and semantic verbal fluency was average. Auditory comprehension  of complex ideational material was intact.   With regard to verbal memory, encoding and acquisition of non-contextual information (i.e., word list) was average across five learning trials. After a brief interference task, free recall was low average (6/16 items). With semantic cueing, he recalled one additional item (low average). After a delay, free recall was low average (6/16 items). Cued recall was low average (8/16 items). Performance on a yes/no recognition task was low average. On another verbal memory test, encoding and acquisition of contextual auditory information (i.e., short stories) was average. After a delay, free recall was low average. Performance on a yes/no recognition task was below expectation. With regard to non-verbal memory, delayed free recall of visual information was average.   Executive functioning was somewhat variable, but mostly within normal limits. Mental flexibility and set-shifting were average on Trails B. Verbal fluency with phonemic search restrictions was borderline. Verbal abstract reasoning was average. Non-verbal abstract reasoning was average. Deductive reasoning was average. Performance on a clock drawing  task was mildly impaired due to incorrect length of minute and hour hands.   On a self-report measure of mood, the patient's responses were not indicative of clinically significant depression at the present time. He did endorse the following symptoms: sadness much of the time, pessimism, feelings of failure, punishment feelings, self-criticalness, concentration difficulty, and loss of libido. He denied suicidal ideation or intention. On a self-report measure of anxiety, the patient did not endorse clinically significant generalized anxiety at the present time.   Mr. Janusz was also administered a more thorough self-report measure of personality and psychopathology (PAI). Unfortunately, certain scores on validity scales suggested that he responded inconsistently to a number of items with highly similar content.  There are several potential reasons for this failure to attend, including reading difficulties, carelessness, or confusion.  Regardless of the cause, however, the test results can only be assumed to be invalid-therefore, no clinical interpretation of the PAI is provided.    Clinical Impressions: No indication of dementia. Cognitive complaints are likely secondary to longstanding depression and anxiety. The patient's performances on formal, objective cognitive evaluation were overall within normal limits. There were no domains of functioning that appeared clinically impaired. His cognitive testing profile demonstrated a relative weakness (although not impaired) in his ability to retrieve newly learned verbal information. Additionally, he had some difficulty with phonemic verbal fluency, although semantic verbal fluency was intact. These test results are certainly not indicative of dementia. I suspect areas of relative weakness, and his subjective cognitive complaints, are due to longstanding mood and anxiety disorders. I hope the current evaluation will provide some reassurance for the patient in  this regard.    Recommendations/Plan: Based on the findings of the present evaluation, the following recommendations are offered:  1. Continue regular psychiatry follow-up. 2. He may benefit from behavioral, or cognitive behavioral, treatment for anxiety/depression (e.g., systematic desensitization, exposure therapy, behavioral activation).  3. He was educated regarding the impact of depression/anxiety on cognitive functioning in daily life. He was reassured test results do not show any sign of underlying dementia. The current test results will serve as a baseline for future comparison if ever needed.   Feedback to Patient: XAVIEN DAUPHINAIS returned for a feedback appointment on 10/17/2017 to review the results of his neuropsychological evaluation with this provider. 10 minutes face-to-face time was spent reviewing his test results, my impressions and my recommendations as detailed above. The patient reported that he was pleased test results did not indicate dementia/AD, but he  was "very disappointed" that we could not diagnose a problem such as vitamin deficiency that would explain his symptoms.   Total time spent on this patient's case: 50 minutes for neurobehavioral status exam with psychologist (CPT code [phone]); 210 minutes of testing/scoring by psychometrician under psychologist's supervision (CPT codes [phone], [phone] units); 180 minutes for integration of patient data, interpretation of standardized test results and clinical data, clinical decision making, treatment planning and preparation of this report, and interactive feedback with review of results to the patient/family by psychologist (CPT codes [phone], [phone] units).      Thank you for your referral of KRISTY CATOE. Please feel free to contact me if you have any questions or concerns regarding this report.

## 2017-10-16 ENCOUNTER — Ambulatory Visit: Payer: Self-pay | Admitting: Psychiatry

## 2017-10-17 ENCOUNTER — Encounter: Payer: Self-pay | Admitting: Psychology

## 2017-10-17 ENCOUNTER — Encounter

## 2017-10-17 ENCOUNTER — Ambulatory Visit (INDEPENDENT_AMBULATORY_CARE_PROVIDER_SITE_OTHER): Payer: 59 | Admitting: Psychology

## 2017-10-17 DIAGNOSIS — R413 Other amnesia: Secondary | ICD-10-CM

## 2017-10-17 NOTE — Patient Instructions (Signed)
Fortunately, results of cognitive testing were NOT indicative of a neurocognitive disorder or dementia.  It is most likely the case that your cognitive symptoms in daily life are due to depression and anxiety.   Below is some more information on this.  I am hopeful that more aggressive treatment of depression and anxiety (e.g. psychotherapy) will not only improve your mood and coping, but also result in improved cognitive functioning in your daily life.    The effect of depression and anxiety on your cognitive functioning: . One of the typical symptoms of depression is difficulty concentrating and making decisions, and various types of anxiety also interfere with attention and concentration . Problems with attention and concentration can disrupt the process of learning and making new memories, which can make it seem like there is a problem with your memory. In your daily life, you may experience this disruption as forgetting names and appointments, misplacing items, and needing to make lists for shopping and errands. It may be harder for you to stay focused on tasks and feel as "sharp" as you did in the past.  . Also, when we are depressed or anxious, we often pay more attention to our difficulties (rather than our strengths) in our daily life, and this can make it seem to [place] like we are doing worse cognitively than we really are. . The cognitive aspects of depression and anxiety are sometimes observed as an identifiable pattern of poor performance on a neuropsychological evaluation, but it is also possible that all scores on an evaluation are within normal limits. . Regardless of the test scores, distress related to depression and anxiety can interfere with the ability to make use of your cognitive resources and function optimally across settings such as work or school, maintaining the home and responsibilities, and personal relationships. . Fortunately, there are treatments for depression and  anxiety, and when mood improves, cognitive functioning in daily life often improves. . Treatment options include psychotherapy, medications (e.g., antidepressants), and behavioral changes, such as increasing your involvement in enjoyable activities, increasing the amount of exercise you are getting, and maintaining a regular routine.

## 2017-11-02 ENCOUNTER — Other Ambulatory Visit: Payer: Self-pay | Admitting: Psychiatry

## 2017-11-04 ENCOUNTER — Other Ambulatory Visit: Payer: Self-pay

## 2017-11-04 MED ORDER — DONEPEZIL HCL 10 MG PO TABS: 10.0000 mg | ORAL_TABLET | Freq: Every day | ORAL | 0 refills | Status: DC

## 2017-11-14 ENCOUNTER — Ambulatory Visit (INDEPENDENT_AMBULATORY_CARE_PROVIDER_SITE_OTHER): Payer: 59 | Admitting: Psychiatry

## 2017-11-14 ENCOUNTER — Encounter: Payer: Self-pay | Admitting: Psychiatry

## 2017-11-14 DIAGNOSIS — F4010 Social phobia, unspecified: Secondary | ICD-10-CM | POA: Diagnosis not present

## 2017-11-14 DIAGNOSIS — F331 Major depressive disorder, recurrent, moderate: Secondary | ICD-10-CM

## 2017-11-14 DIAGNOSIS — F422 Mixed obsessional thoughts and acts: Secondary | ICD-10-CM

## 2017-11-14 NOTE — Progress Notes (Signed)
Zachary [name] [id number] 1960-05-20 57 y.o.  Subjective:   Patient ID:  Zachary [name] is a 57 y.o. (DOB Sep 03, 1960) male.  Chief Complaint:  Chief Complaint  Patient presents with  . Memory Loss  . Anxiety  . Depression    HPI Zachary [name] presents to the office today for follow-up of depression, obsessive anxiety and paranoia about body odor and getting dementia.  Did feel somewhat reassured about the fact that Dr. Marcos Eke said he does not have Alzheimer's dx.  Overall he thinks the depression and anxiety levels have been "fine".  Friends notice his comprehension problems. Not sure he trusts the neuropsychological testing which was normal and did not show dementia as he fears.  Patient reports stable mood and denies depressed or irritable moods.  Patient denies any recent difficulty with anxiety other than mentioned.  Still suspicious that he will not do well at work if the demands increase.  Patient denies difficulty with sleep initiation or maintenance. Denies appetite disturbance.  Patient reports that energy and motivation have been good.  Patient denies any difficulty with concentration.  Patient denies any suicidal ideation.  Good interest and enjoyment.    Review of Systems:  Review of Systems  Neurological: Negative for tremors and weakness.  Psychiatric/Behavioral: Negative for agitation, behavioral problems, confusion, decreased concentration, dysphoric mood, hallucinations, self-injury, sleep disturbance and suicidal ideas. The patient is nervous/anxious. The patient is not hyperactive.     Medications: I have reviewed the patient's current medications.  Current Outpatient Medications  Medication Sig Dispense Refill  . Acetylcysteine (N-ACETYL-L-CYSTEINE) 600 MG CAPS Take by mouth.    Marland Kitchen b complex vitamins capsule Take 1 capsule by mouth daily.    . busPIRone (BUSPAR) 30 MG tablet TAKE 1 TABLET(30 MG) BY MOUTH TWICE DAILY 60 tablet 1  . donepezil  (ARICEPT) 10 MG tablet Take 1 tablet (10 mg total) by mouth at bedtime. 90 tablet 0  . ferrous sulfate 325 (65 FE) MG EC tablet Take 325 mg by mouth 3 (three) times daily with meals.    . fluvoxaMINE (LUVOX) 100 MG tablet Take 100 mg by mouth. 1 tabs am and 3 tab qhs    . lamoTRIgine (LAMICTAL) 200 MG tablet Take 200 mg by mouth at bedtime.     Marland Kitchen lithium carbonate 150 MG capsule Take 150 mg by mouth at bedtime.    . Multiple Vitamin (MULTIVITAMIN WITH MINERALS) TABS Take 1 tablet by mouth daily.    Marland Kitchen OLANZapine (ZYPREXA) 20 MG tablet TAKE 1 TABLET BY MOUTH EVERY NIGHT AT BEDTIME 90 tablet 0  . ALPRAZolam (XANAX PO) Take 1 tablet by mouth.    . clarithromycin (BIAXIN) 500 MG tablet Take 1 tablet (500 mg total) by mouth 2 (two) times daily. (Patient not taking: Reported on 11/14/2017) 20 tablet 0   No current facility-administered medications for this visit.     Medication Side Effects: None  Allergies:  Allergies  Allergen Reactions  . Penicillins     REACTION: "it may kill me"  . Tetracyclines & Related Hives    Past Medical History:  Diagnosis Date  . Allergy   . Anxiety   . Crohn disease ([place])    pt reports subsequent testing was normal  . Depression   . Herpes simplex   . IBS (irritable bowel syndrome)   . Incontinence     Family History  Problem Relation Age of Onset  . Arthritis Mother   . Stroke Mother   .  Depression Mother   . Hypertension Father   . Cancer Father 65       prostate cancer  . Prostate cancer Father   . Cancer Maternal Aunt   . Cancer Maternal Uncle   . Cancer Maternal Grandmother   . Cancer Maternal Grandfather   . Cancer Paternal Grandmother   . Cancer Paternal Uncle        prostate cancer  . Colon cancer Neg Hx   . Rectal cancer Neg Hx   . Stomach cancer Neg Hx     Social History   Socioeconomic History  . Marital status: Single    Spouse name: Not on file  . Number of children: Not on file  . Years of education: Not on file  .  Highest education level: Not on file  Occupational History  . Not on file  Social Needs  . Financial resource strain: Not on file  . Food insecurity:    Worry: Not on file    Inability: Not on file  . Transportation needs:    Medical: Not on file    Non-medical: Not on file  Tobacco Use  . Smoking status: Never Smoker  . Smokeless tobacco: Never Used  Substance and Sexual Activity  . Alcohol use: No  . Drug use: No  . Sexual activity: Not Currently  Lifestyle  . Physical activity:    Days per week: Not on file    Minutes per session: Not on file  . Stress: Not on file  Relationships  . Social connections:    Talks on phone: Not on file    Gets together: Not on file    Attends religious service: Not on file    Active member of club or organization: Not on file    Attends meetings of clubs or organizations: Not on file    Relationship status: Not on file  . Intimate partner violence:    Fear of current or ex partner: Not on file    Emotionally abused: Not on file    Physically abused: Not on file    Forced sexual activity: Not on file  Other Topics Concern  . Not on file  Social History Narrative  . Not on file    Past Medical History, Surgical history, Social history, and Family history were reviewed and updated as appropriate.   Please see review of systems for further details on the patient's review from today.   Objective:   Physical Exam:  There were no vitals taken for this visit.  Physical Exam  Neurological: He displays no tremor. Gait abnormal.  Psychiatric: His speech is normal and behavior is normal. Judgment normal. His mood appears anxious. His affect is blunt. His affect is not angry. He is not actively hallucinating. Thought content is paranoid. Cognition and memory are normal. He does not exhibit a depressed mood. He expresses no homicidal and no suicidal ideation.  Insight and judgment are fair. Overall paranoid tendency is better with the higher  dosage of olanzapine, but he still is residual. He is attentive.    Lab Review:     Component Value Date/Time   NA 138 07/30/2013 0913   K 4.7 07/30/2013 0913   CL 102 07/30/2013 0913   CO2 26 07/30/2013 0913   GLUCOSE 89 07/30/2013 0913   BUN 10 07/30/2013 0913   CREATININE 0.91 07/30/2013 0913   CALCIUM 8.7 07/30/2013 0913   PROT 6.3 07/30/2013 0913   ALBUMIN 3.8 07/30/2013 0913  AST 28 07/30/2013 0913   ALT 38 07/30/2013 0913   ALKPHOS 64 07/30/2013 0913   BILITOT 0.5 07/30/2013 0913   GFRNONAA >60 09/22/2009 1045   GFRAA  09/22/2009 1045    >60        The eGFR has been calculated using the MDRD equation. This calculation has not been validated in all clinical situations. eGFR's persistently <60 mL/min signify possible Chronic Kidney Disease.       Component Value Date/Time   WBC 7.7 05/25/2016 0739   RBC 4.72 05/25/2016 0739   HGB 14.7 05/25/2016 0739   HCT 43.0 05/25/2016 0739   PLT 306 05/25/2016 0739   MCV 91.1 05/25/2016 0739   MCH 31.1 05/25/2016 0739   MCHC 34.2 05/25/2016 0739   RDW 13.6 05/25/2016 0739   LYMPHSABS 1,925 05/25/2016 0739   MONOABS 693 05/25/2016 0739   EOSABS 231 05/25/2016 0739   BASOSABS 77 05/25/2016 0739    No results found for: POCLITH, LITHIUM   No results found for: PHENYTOIN, PHENOBARB, VALPROATE, CBMZ   .res Assessment: Plan:    Major depressive disorder, recurrent episode, moderate ([place])  Mixed obsessional thoughts and acts  Social anxiety disorder  Zachary [name] has been diagnosed with treatment resistant depression with psychotic features, OCD and social anxiety but schizoaffective may be more appropriate diagnosis given this chronic paranoid thinking.  Specifically he is chronically sleep suspicious that he has dementia despite neuropsychological testing which tells him he does not.  He also has chronic thoughts that other people find his odor offensive because of passing gas.  There is very little evidence to support  this.  He has been under my psychiatric care for many many years and never owed noticed body odor and appointments at any time.  Overall his paranoia and anxiety and depression seem improved since the increase in olanzapine.  We will therefore continue his current dosages.  We extensively discussed the neuropsychological testing results from Dr. Marion Downer.  She met with him on October 17 and discussed the results.  In summary there was no indication for dementia.  Cognitive complaints were attributed to long-standing depression and anxiety.  His overall performance was within normal limits.  There were some relative weaknesses such as processing speed was low average but overall his results were normal.  The details are available in the chart.  Discussed the risk of serotonin syndrome with the writer than usual dose of fluvoxamine but again this appears to have been helpful for his anxiety and obsessive thinking.  Discussed potential metabolic side effects associated with atypical antipsychotics, as well as potential risk for movement side effects. Advised pt to contact office if movement side effects occur.  No evidence of movement disorder.  Discussed the risk of polypharmacy but again if appears medically necessary, helpful and well tolerated.  This appt was 30 mins.   FU 3 mos.  Lynder Parents, MD, DFAPA  Please see After Visit Summary for patient specific instructions.  Future Appointments  Date Time Provider [place]  02/17/2018  4:30 PM Cottle, Billey Co., MD [facility] None    No orders of the defined types were placed in this encounter.     -------------------------------

## 2017-12-02 ENCOUNTER — Other Ambulatory Visit: Payer: Self-pay

## 2017-12-02 ENCOUNTER — Other Ambulatory Visit: Payer: Self-pay | Admitting: Psychiatry

## 2017-12-02 MED ORDER — ALPRAZOLAM 0.5 MG PO TABS: 0.5000 mg | ORAL_TABLET | Freq: Every day | ORAL | 2 refills | Status: DC | PRN

## 2017-12-17 ENCOUNTER — Ambulatory Visit: Payer: 59 | Admitting: Family Medicine

## 2017-12-17 VITALS — BP 126/82 | HR 84 | Temp 97.5°F | Wt 246.2 lb

## 2017-12-17 DIAGNOSIS — M549 Dorsalgia, unspecified: Secondary | ICD-10-CM | POA: Diagnosis not present

## 2017-12-17 NOTE — Patient Instructions (Signed)
2 Aleve twice per day to every 4 hours and you can take Tylenol if you need to on top of that.  Also heat for 20 minutes 3 times per day.  Proper posturing and lifting

## 2017-12-17 NOTE — Progress Notes (Signed)
   Subjective:    Patient ID: Nunzio Cory, male    DOB: 26-Aug-1960, 57 y.o.   MRN: [id number]  HPI 1 week ago while moving some items at home he felt pain in the right upper back area.  It bothers him especially when he moves or takes a deep breath.   Review of Systems     Objective:   Physical Exam Alert and in no distress.  Exam of his upper right back area near the rhomboids and latissimus does show some slight tenderness palpation.      Assessment & Plan:  Musculoskeletal back pain Recommend heat, stretching, 2 Aleve twice per day.  Discussed proper back care with him in terms of lifting sitting and standing.  Demonstrated them to him.

## 2017-12-18 ENCOUNTER — Encounter (HOSPITAL_COMMUNITY): Payer: Self-pay

## 2017-12-18 ENCOUNTER — Emergency Department (HOSPITAL_COMMUNITY)
Admission: EM | Admit: 2017-12-18 | Discharge: 2017-12-18 | Disposition: A | Discharge status: Home / Self Care | Payer: Worker's Compensation | Attending: Emergency Medicine | Admitting: Emergency Medicine

## 2017-12-18 ENCOUNTER — Encounter (HOSPITAL_COMMUNITY): Payer: Self-pay | Admitting: Emergency Medicine

## 2017-12-18 ENCOUNTER — Emergency Department (HOSPITAL_COMMUNITY)
Admission: EM | Admit: 2017-12-18 | Discharge: 2017-12-18 | Disposition: A | Discharge status: Home / Self Care | Payer: 59 | Attending: Emergency Medicine | Admitting: Emergency Medicine

## 2017-12-18 ENCOUNTER — Other Ambulatory Visit: Payer: Self-pay

## 2017-12-18 DIAGNOSIS — S0181XD Laceration without foreign body of other part of head, subsequent encounter: Secondary | ICD-10-CM | POA: Insufficient documentation

## 2017-12-18 DIAGNOSIS — W2209XA Striking against other stationary object, initial encounter: Secondary | ICD-10-CM | POA: Diagnosis not present

## 2017-12-18 DIAGNOSIS — S0181XA Laceration without foreign body of other part of head, initial encounter: Secondary | ICD-10-CM | POA: Insufficient documentation

## 2017-12-18 DIAGNOSIS — Z79899 Other long term (current) drug therapy: Secondary | ICD-10-CM | POA: Insufficient documentation

## 2017-12-18 DIAGNOSIS — Y990 Civilian activity done for income or pay: Secondary | ICD-10-CM | POA: Insufficient documentation

## 2017-12-18 DIAGNOSIS — Y9389 Activity, other specified: Secondary | ICD-10-CM | POA: Insufficient documentation

## 2017-12-18 DIAGNOSIS — Y9289 Other specified places as the place of occurrence of the external cause: Secondary | ICD-10-CM | POA: Insufficient documentation

## 2017-12-18 DIAGNOSIS — W2209XD Striking against other stationary object, subsequent encounter: Secondary | ICD-10-CM | POA: Insufficient documentation

## 2017-12-18 MED ORDER — LIDOCAINE-EPINEPHRINE (PF) 2 %-1:200000 IJ SOLN
10.0000 mL | Freq: Once | INTRAMUSCULAR | Status: AC
  Administered 2017-12-18: 10 mL
  Filled 2017-12-18: qty 20

## 2017-12-18 NOTE — ED Notes (Signed)
ED Provider at bedside. 
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
## 2017-12-18 NOTE — ED Notes (Signed)
Pt verbalized understanding of discharge instructions and denies any further questions at this time.   

## 2017-12-18 NOTE — ED Notes (Signed)
IV removed from left ac

## 2017-12-18 NOTE — ED Provider Notes (Signed)
[facility] EMERGENCY DEPARTMENT Provider Note   CSN: [id number] Arrival date & time: 12/18/17  1919     History   Chief Complaint Chief Complaint  Patient presents with  . Laceration    HPI Zachary [name] is a 57 y.o. male who presents for evaluation of his laceration that was repaired earlier today that began bleeding through the dressing.  He denies any trauma other than putting ice on it.  Bleeding began 30 minutes prior to arrival and has slowed down.  Tetanus is up-to-date.  Patient denies any other complaints.  HPI  Past Medical History:  Diagnosis Date  . Allergy   . Anxiety   . Crohn disease ([place])    pt reports subsequent testing was normal  . Depression   . Herpes simplex   . IBS (irritable bowel syndrome)   . Incontinence     Patient Active Problem List   Diagnosis Date Noted  . OCD (obsessive compulsive disorder) 10/10/2017  . Social anxiety disorder 10/10/2017  . Seasonal allergies 06/26/2010  . Herpes simplex 06/26/2010  . IRRITABLE BOWEL SYNDROME 12/15/2007  . FLATULENCE ERUCTATION AND GAS PAIN 09/23/2007  . Major depression 09/22/2007  . CROHN'S DISEASE, LARGE AND SMALL INTESTINES 09/22/2007  . COLONIC POLYPS, HYPERPLASTIC, HX OF 09/22/2007  . RECTAL BLEEDING, HX OF 09/22/2007    Past Surgical History:  Procedure Laterality Date  . APPENDECTOMY    . COLONOSCOPY    . POLYPECTOMY    . TWISTED TESTICLES  1970   [facility]   . UMBILICAL HERNIA REPAIR          Home Medications    Prior to Admission medications   Medication Sig Start Date End Date Taking? Authorizing Provider  Acetylcysteine (N-ACETYL-L-CYSTEINE) 600 MG CAPS Take by mouth.    [provider]  ALPRAZolam Duanne Moron) 0.5 MG tablet Take 1 tablet (0.5 mg total) by mouth daily as needed. 12/02/17   Cottle, Billey Co., MD  b complex vitamins capsule Take 1 capsule by mouth daily.    [provider]  busPIRone (BUSPAR) 30 MG tablet TAKE 1  TABLET(30 MG) BY MOUTH TWICE DAILY 11/04/17   Cottle, Billey Co., MD  clarithromycin (BIAXIN) 500 MG tablet Take 1 tablet (500 mg total) by mouth 2 (two) times daily. Patient not taking: Reported on 11/14/2017 09/16/17   Denita Lung, MD  donepezil (ARICEPT) 10 MG tablet Take 1 tablet (10 mg total) by mouth at bedtime. 11/04/17   Cottle, Billey Co., MD  ferrous sulfate 325 (65 FE) MG EC tablet Take 325 mg by mouth 3 (three) times daily with meals.    [provider]  fluvoxaMINE (LUVOX) 100 MG tablet TAKE 1 TABLET BY MOUTH EVERY MORNING THEN TAKE 3 TABLETS BY MOUTH EVERY EVENING 12/02/17   Cottle, Billey Co., MD  lamoTRIgine (LAMICTAL) 200 MG tablet TAKE 1 TABLET BY MOUTH EVERY DAY 12/02/17   Cottle, Billey Co., MD  lithium carbonate 150 MG capsule Take 150 mg by mouth at bedtime.    [provider]  Multiple Vitamin (MULTIVITAMIN WITH MINERALS) TABS Take 1 tablet by mouth daily.    [provider]  OLANZapine (ZYPREXA) 20 MG tablet TAKE 1 TABLET BY MOUTH EVERY NIGHT AT BEDTIME 11/04/17   Cottle, Billey Co., MD    Family History Family History  Problem Relation Age of Onset  . Arthritis Mother   . Stroke Mother   . Depression Mother   .  Hypertension Father   . Cancer Father 89       prostate cancer  . Prostate cancer Father   . Cancer Maternal Aunt   . Cancer Maternal Uncle   . Cancer Maternal Grandmother   . Cancer Maternal Grandfather   . Cancer Paternal Grandmother   . Cancer Paternal Uncle        prostate cancer  . Colon cancer Neg Hx   . Rectal cancer Neg Hx   . Stomach cancer Neg Hx     Social History Social History   Tobacco Use  . Smoking status: Never Smoker  . Smokeless tobacco: Never Used  Substance Use Topics  . Alcohol use: No  . Drug use: No     Allergies   Erythromycin; Penicillins; and Tetracyclines & related   Review of Systems Review of Systems  Constitutional: Negative for fever.  Skin: Positive for wound.      Physical Exam Updated Vital Signs BP (!) 153/94 (BP Location: Right Arm)   Pulse 97   Temp 97.7 F (36.5 C) (Oral)   Resp 18   SpO2 93%   Physical Exam Vitals signs and nursing note reviewed.  Constitutional:      General: He is not in acute distress.    Appearance: He is well-developed. He is not diaphoretic.  HENT:     Head: Normocephalic.     Mouth/Throat:     Pharynx: No oropharyngeal exudate.  Eyes:     General: No scleral icterus.       Right eye: No discharge.        Left eye: No discharge.     Conjunctiva/sclera: Conjunctivae normal.     Pupils: Pupils are equal, round, and reactive to light.  Neck:     Musculoskeletal: Normal range of motion and neck supple.     Thyroid: No thyromegaly.  Cardiovascular:     Rate and Rhythm: Normal rate.  Pulmonary:     Effort: Pulmonary effort is normal. No respiratory distress.  Lymphadenopathy:     Cervical: No cervical adenopathy.  Skin:    General: Skin is warm and dry.     Coloration: Skin is not pale.     Findings: No rash.     Comments: Wounds on the forehead and bridge of nose intact with no active bleeding, however there is clotted area in the distal part of the wound on the bridge of the nose that seems to be where the bleeding came from.  Neurological:     Mental Status: He is alert.     Coordination: Coordination normal.      ED Treatments / Results  Labs (all labs ordered are listed, but only abnormal results are displayed) Labs Reviewed - No data to display  EKG None  Radiology No results found.  Procedures Procedures (including critical care time)  Medications Ordered in ED Medications - No data to display   Initial Impression / Assessment and Plan / ED Course  I have reviewed the triage vital signs and the nursing notes.  Pertinent labs & imaging results that were available during my care of the patient were reviewed by me and considered in my medical decision making (see chart for  details).     Patient with previously repaired lacerations today.  Bleeding is controlled on takedown of both dressings.  No active bleeding.  Dressings were placed.  Wound care and return precautions discussed.  Patient understands and agrees with plan.  Patient vital stable throughout  ED course and discharged in satisfactory condition.  Final Clinical Impressions(s) / ED Diagnoses   Final diagnoses:  Facial laceration, subsequent encounter    ED Discharge Orders    None       Frederica Kuster, PA-C 12/18/17 2011    Lennice Sites, DO 12/19/17 0036

## 2017-12-18 NOTE — Discharge Instructions (Addendum)
Please read instructions below. You can take 600 mg of Advil/ibuprofen every 6 hours as needed for headache. Apply ice for 20 minutes at a time to help with swelling. Schedule an appointment with your primary care provider in 5-7 days for wound recheck and suture removal.  Return to the ER for severely worsening headache, vision changes, vomiting, weakness or numbness of your extremities, or new or concerning symptoms.

## 2017-12-18 NOTE — Discharge Instructions (Addendum)
Treatment: Keep your wound dry and dressing applied until this time tomorrow. After 24 hours, you may wash with warm soapy water. Dry and apply antibiotic ointment and clean dressing. Do this daily until your sutures are removed.  Follow-up: Please follow-up with your primary care provider or return to emergency department in 5-7 days for suture removal. Be aware of signs of infection: fever, increasing pain, redness, swelling, drainage from the area. Please call your primary care provider or return to emergency department if you develop any of these symptoms or if any of the sutures come out prior to removal. Please return to the emergency department if you develop any other new or worsening symptoms.

## 2017-12-18 NOTE — ED Triage Notes (Signed)
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 Pt has c-collar in place. Pt states last tetanus less than 2 years ago. Laceration to forehead and bridge of his nose. No LOC.

## 2017-12-18 NOTE — ED Triage Notes (Signed)
Pt reports he was here earlier and had stitches. Pt reports it started bleeding 30 minutes ago. Pt has bandage intact from earlier.

## 2017-12-20 ENCOUNTER — Emergency Department (HOSPITAL_COMMUNITY)
Admission: EM | Admit: 2017-12-20 | Discharge: 2017-12-20 | Disposition: A | Discharge status: Home / Self Care | Payer: 59 | Attending: Emergency Medicine | Admitting: Emergency Medicine

## 2017-12-20 ENCOUNTER — Other Ambulatory Visit: Payer: Self-pay

## 2017-12-20 ENCOUNTER — Encounter (HOSPITAL_COMMUNITY): Payer: Self-pay

## 2017-12-20 DIAGNOSIS — S0181XD Laceration without foreign body of other part of head, subsequent encounter: Secondary | ICD-10-CM | POA: Insufficient documentation

## 2017-12-20 DIAGNOSIS — S0990XD Unspecified injury of head, subsequent encounter: Secondary | ICD-10-CM | POA: Diagnosis present

## 2017-12-20 DIAGNOSIS — Z79899 Other long term (current) drug therapy: Secondary | ICD-10-CM | POA: Insufficient documentation

## 2017-12-20 DIAGNOSIS — X58XXXD Exposure to other specified factors, subsequent encounter: Secondary | ICD-10-CM | POA: Diagnosis not present

## 2017-12-20 MED ORDER — BACITRACIN ZINC 500 UNIT/GM EX OINT
TOPICAL_OINTMENT | Freq: Two times a day (BID) | CUTANEOUS | Status: DC
  Administered 2017-12-20: 02:00:00 via TOPICAL
  Filled 2017-12-20: qty 1.8

## 2017-12-20 NOTE — ED Provider Notes (Signed)
[facility] EMERGENCY DEPARTMENT Provider Note   CSN: [id number] Arrival date & time: 12/20/17  0056     History   Chief Complaint Chief Complaint  Patient presents with  . Facial Injury    HPI Zachary [name] is a 57 y.o. male.  The history is provided by the patient.  He was seen in the emergency department 2 days ago and had sutures placed in a laceration on his forehead.  Tonight, he felt a pop and thinks that 1 or 2 cysts stitches may have broken.  Past Medical History:  Diagnosis Date  . Allergy   . Anxiety   . Crohn disease ([place])    pt reports subsequent testing was normal  . Depression   . Herpes simplex   . IBS (irritable bowel syndrome)   . Incontinence     Patient Active Problem List   Diagnosis Date Noted  . OCD (obsessive compulsive disorder) 10/10/2017  . Social anxiety disorder 10/10/2017  . Seasonal allergies 06/26/2010  . Herpes simplex 06/26/2010  . IRRITABLE BOWEL SYNDROME 12/15/2007  . FLATULENCE ERUCTATION AND GAS PAIN 09/23/2007  . Major depression 09/22/2007  . CROHN'S DISEASE, LARGE AND SMALL INTESTINES 09/22/2007  . COLONIC POLYPS, HYPERPLASTIC, HX OF 09/22/2007  . RECTAL BLEEDING, HX OF 09/22/2007    Past Surgical History:  Procedure Laterality Date  . APPENDECTOMY    . COLONOSCOPY    . POLYPECTOMY    . TWISTED TESTICLES  1970   [facility]   . UMBILICAL HERNIA REPAIR          Home Medications    Prior to Admission medications   Medication Sig Start Date End Date Taking? Authorizing Provider  Acetylcysteine (N-ACETYL-L-CYSTEINE) 600 MG CAPS Take by mouth.    [provider]  ALPRAZolam Duanne Moron) 0.5 MG tablet Take 1 tablet (0.5 mg total) by mouth daily as needed. 12/02/17   Cottle, Billey Co., MD  b complex vitamins capsule Take 1 capsule by mouth daily.    [provider]  busPIRone (BUSPAR) 30 MG tablet TAKE 1 TABLET(30 MG) BY MOUTH TWICE DAILY 11/04/17   Cottle, Billey Co., MD    clarithromycin (BIAXIN) 500 MG tablet Take 1 tablet (500 mg total) by mouth 2 (two) times daily. Patient not taking: Reported on 11/14/2017 09/16/17   Denita Lung, MD  donepezil (ARICEPT) 10 MG tablet Take 1 tablet (10 mg total) by mouth at bedtime. 11/04/17   Cottle, Billey Co., MD  ferrous sulfate 325 (65 FE) MG EC tablet Take 325 mg by mouth 3 (three) times daily with meals.    [provider]  fluvoxaMINE (LUVOX) 100 MG tablet TAKE 1 TABLET BY MOUTH EVERY MORNING THEN TAKE 3 TABLETS BY MOUTH EVERY EVENING 12/02/17   Cottle, Billey Co., MD  lamoTRIgine (LAMICTAL) 200 MG tablet TAKE 1 TABLET BY MOUTH EVERY DAY 12/02/17   Cottle, Billey Co., MD  lithium carbonate 150 MG capsule Take 150 mg by mouth at bedtime.    [provider]  Multiple Vitamin (MULTIVITAMIN WITH MINERALS) TABS Take 1 tablet by mouth daily.    [provider]  OLANZapine (ZYPREXA) 20 MG tablet TAKE 1 TABLET BY MOUTH EVERY NIGHT AT BEDTIME 11/04/17   Cottle, Billey Co., MD    Family History Family History  Problem Relation Age of Onset  . Arthritis Mother   . Stroke Mother   . Depression Mother   . Hypertension Father   .  Cancer Father 53       prostate cancer  . Prostate cancer Father   . Cancer Maternal Aunt   . Cancer Maternal Uncle   . Cancer Maternal Grandmother   . Cancer Maternal Grandfather   . Cancer Paternal Grandmother   . Cancer Paternal Uncle        prostate cancer  . Colon cancer Neg Hx   . Rectal cancer Neg Hx   . Stomach cancer Neg Hx     Social History Social History   Tobacco Use  . Smoking status: Never Smoker  . Smokeless tobacco: Never Used  Substance Use Topics  . Alcohol use: No  . Drug use: No     Allergies   Erythromycin; Penicillins; and Tetracyclines & related   Review of Systems Review of Systems  All other systems reviewed and are negative.    Physical Exam Updated Vital Signs BP 133/90   Pulse 76   Resp 16   SpO2 96%    Physical Exam Vitals signs and nursing note reviewed.    57 year old male, resting comfortably and in no acute distress. Vital signs are normal. Oxygen saturation is 96%, which is normal. Head is normocephalic.  Forehead laceration noted to have 5 sutures in place, no evidence of any broken sutures.  Wound is healing appropriately without any tendency towards dehiscence.  Periorbital ecchymosis present bilaterally. PERRLA, EOMI. Oropharynx is clear. Neck is nontender and supple without adenopathy or JVD. Back is nontender and there is no CVA tenderness. Lungs are clear without rales, wheezes, or rhonchi. Chest is nontender. Heart has regular rate and rhythm without murmur. Abdomen is soft, flat, nontender without masses or hepatosplenomegaly and peristalsis is normoactive. Extremities have no cyanosis or edema, full range of motion is present. Skin is warm and dry without rash. Neurologic: Mental status is normal, cranial nerves are intact, there are no motor or sensory deficits.  ED Treatments / Results   Procedures Procedures   Medications Ordered in ED Medications  bacitracin ointment (has no administration in time range)     Initial Impression / Assessment and Plan / ED Course  I have reviewed the triage vital signs and the nursing notes.  Forehead laceration healing well, no evidence of broken suture.  No need for any interventions.  Old records reviewed confirming ED visit 2 days ago for facial laceration treated with 5 sutures.  Final Clinical Impressions(s) / ED Diagnoses   Final diagnoses:  Forehead laceration, subsequent encounter    ED Discharge Orders    None       Delora Fuel, MD 01/04/02 [phone]

## 2017-12-20 NOTE — ED Triage Notes (Signed)
Pt here after having sutures placed on the 18th of December. Seen previously after there was bleeding around the sutures.  Tonight pt states he felt a pop in the sutures on his forehead.  No bleeding noted.  A&Ox4

## 2017-12-20 NOTE — Discharge Instructions (Signed)
Resume routine wound care.

## 2017-12-30 ENCOUNTER — Encounter: Payer: Self-pay | Admitting: Family Medicine

## 2017-12-30 ENCOUNTER — Ambulatory Visit: Payer: 59 | Admitting: Family Medicine

## 2017-12-30 ENCOUNTER — Ambulatory Visit: Payer: Self-pay | Admitting: Family Medicine

## 2017-12-30 VITALS — BP 160/100 | HR 92 | Ht 75.0 in | Wt 245.6 lb

## 2017-12-30 DIAGNOSIS — R030 Elevated blood-pressure reading, without diagnosis of hypertension: Secondary | ICD-10-CM

## 2017-12-30 DIAGNOSIS — M79604 Pain in right leg: Secondary | ICD-10-CM | POA: Diagnosis not present

## 2017-12-30 NOTE — Patient Instructions (Signed)
Your exam didn't suggest any major abnormality that I could find. Perhaps something got strained, perhaps related to the accident or change in walking/posture because of it.  Try gentle stretches (hamstring and calf stretches). Try using moist heat to the painful areas, and/or massage. You may continue to take 2 aleve twice daily for another 5 days, if needed. Be sure to take it with food, and stop taking it if your develop any upper stomach pain. You can also try some topical medications, such as Icy hot, or Biofreeze, or topical CBD oil. Sitting up doesn't seem to bother you. Try and figure out other positions which seem to feel good, and avoid those which cause more discomfort (laying on your back seems to make your pain worse).  Your blood pressure was elevated today, and has been only since your accident, likely related to pain. Be sure that you aren't eating a lot of salty foods (which can also raise blood pressure). Try and get regular exercise, monitor your blood pressure elsewhere, and return sooner than your next scheduled appointment if your blood pressure remains high, or if your pain is worsening.  Your physical isn't until March--don't wait that long if BP remains high.   Low-Sodium Eating Plan Sodium, which is an element that makes up salt, helps you maintain a healthy balance of fluids in your body. Too much sodium can increase your blood pressure and cause fluid and waste to be held in your body. Your health care provider or dietitian may recommend following this plan if you have high blood pressure (hypertension), kidney disease, liver disease, or heart failure. Eating less sodium can help lower your blood pressure, reduce swelling, and protect your heart, liver, and kidneys. What are tips for following this plan? General guidelines  Most people on this plan should limit their sodium intake to 1,500-2,000 mg (milligrams) of sodium each day. Reading food labels   The  Nutrition Facts label lists the amount of sodium in one serving of the food. If you eat more than one serving, you must multiply the listed amount of sodium by the number of servings.  Choose foods with less than 140 mg of sodium per serving.  Avoid foods with 300 mg of sodium or more per serving. Shopping  Look for lower-sodium products, often labeled as "low-sodium" or "no salt added."  Always check the sodium content even if foods are labeled as "unsalted" or "no salt added".  Buy fresh foods. ? Avoid canned foods and premade or frozen meals. ? Avoid canned, cured, or processed meats  Buy breads that have less than 80 mg of sodium per slice. Cooking  Eat more home-cooked food and less restaurant, buffet, and fast food.  Avoid adding salt when cooking. Use salt-free seasonings or herbs instead of table salt or sea salt. Check with your health care provider or pharmacist before using salt substitutes.  Cook with plant-based oils, such as canola, sunflower, or olive oil. Meal planning  When eating at a restaurant, ask that your food be prepared with less salt or no salt, if possible.  Avoid foods that contain MSG (monosodium glutamate). MSG is sometimes added to [place] food, bouillon, and some canned foods. What foods are recommended? The items listed may not be a complete list. Talk with your dietitian about what dietary choices are best for you. Grains Low-sodium cereals, including oats, puffed wheat and rice, and shredded wheat. Low-sodium crackers. Unsalted rice. Unsalted pasta. Low-sodium bread. Whole-grain breads and whole-grain pasta. Vegetables  Fresh or frozen vegetables. "No salt added" canned vegetables. "No salt added" tomato sauce and paste. Low-sodium or reduced-sodium tomato and vegetable juice. Fruits Fresh, frozen, or canned fruit. Fruit juice. Meats and other protein foods Fresh or frozen (no salt added) meat, poultry, seafood, and fish. Low-sodium canned tuna  and salmon. Unsalted nuts. Dried peas, beans, and lentils without added salt. Unsalted canned beans. Eggs. Unsalted nut butters. Dairy Milk. Soy milk. Cheese that is naturally low in sodium, such as ricotta cheese, fresh mozzarella, or Swiss cheese Low-sodium or reduced-sodium cheese. Cream cheese. Yogurt. Fats and oils Unsalted butter. Unsalted margarine with no trans fat. Vegetable oils such as canola or olive oils. Seasonings and other foods Fresh and dried herbs and spices. Salt-free seasonings. Low-sodium mustard and ketchup. Sodium-free salad dressing. Sodium-free light mayonnaise. Fresh or refrigerated horseradish. Lemon juice. Vinegar. Homemade, reduced-sodium, or low-sodium soups. Unsalted popcorn and pretzels. Low-salt or salt-free chips. What foods are not recommended? The items listed may not be a complete list. Talk with your dietitian about what dietary choices are best for you. Grains Instant hot cereals. Bread stuffing, pancake, and biscuit mixes. Croutons. Seasoned rice or pasta mixes. Noodle soup cups. Boxed or frozen macaroni and cheese. Regular salted crackers. Self-rising flour. Vegetables Sauerkraut, pickled vegetables, and relishes. Olives. [place] fries. Onion rings. Regular canned vegetables (not low-sodium or reduced-sodium). Regular canned tomato sauce and paste (not low-sodium or reduced-sodium). Regular tomato and vegetable juice (not low-sodium or reduced-sodium). Frozen vegetables in sauces. Meats and other protein foods Meat or fish that is salted, canned, smoked, spiced, or pickled. Bacon, ham, sausage, hotdogs, corned beef, chipped beef, packaged lunch meats, salt pork, jerky, pickled herring, anchovies, regular canned tuna, sardines, salted nuts. Dairy Processed cheese and cheese spreads. Cheese curds. Blue cheese. Feta cheese. String cheese. Regular cottage cheese. Buttermilk. Canned milk. Fats and oils Salted butter. Regular margarine. Ghee. Bacon  fat. Seasonings and other foods Onion salt, garlic salt, seasoned salt, table salt, and sea salt. Canned and packaged gravies. Worcestershire sauce. Tartar sauce. Barbecue sauce. Teriyaki sauce. Soy sauce, including reduced-sodium. Steak sauce. Fish sauce. Oyster sauce. Cocktail sauce. Horseradish that you find on the shelf. Regular ketchup and mustard. Meat flavorings and tenderizers. Bouillon cubes. Hot sauce and Tabasco sauce. Premade or packaged marinades. Premade or packaged taco seasonings. Relishes. Regular salad dressings. Salsa. Potato and tortilla chips. Corn chips and puffs. Salted popcorn and pretzels. Canned or dried soups. Pizza. Frozen entrees and pot pies. Summary  Eating less sodium can help lower your blood pressure, reduce swelling, and protect your heart, liver, and kidneys.  Most people on this plan should limit their sodium intake to 1,500-2,000 mg (milligrams) of sodium each day.  Canned, boxed, and frozen foods are high in sodium. Restaurant foods, fast foods, and pizza are also very high in sodium. You also get sodium by adding salt to food.  Try to cook at home, eat more fresh fruits and vegetables, and eat less fast food, canned, processed, or prepared foods. This information is not intended to replace advice given to you by your health care provider. Make sure you discuss any questions you have with your health care provider. Document Released: 06/09/2001 Document Revised: 12/12/2015 Document Reviewed: 12/12/2015 Elsevier Interactive Patient Education  2019 [employer].
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
## 2018-01-01 ENCOUNTER — Other Ambulatory Visit: Payer: Self-pay | Admitting: Psychiatry

## 2018-01-27 ENCOUNTER — Ambulatory Visit: Payer: 59 | Admitting: Family Medicine

## 2018-01-27 ENCOUNTER — Encounter: Payer: Self-pay | Admitting: Family Medicine

## 2018-01-27 VITALS — BP 158/92 | HR 79 | Temp 97.9°F | Wt 242.0 lb

## 2018-01-27 DIAGNOSIS — I10 Essential (primary) hypertension: Secondary | ICD-10-CM | POA: Diagnosis not present

## 2018-01-27 MED ORDER — LISINOPRIL 10 MG PO TABS: 10.0000 mg | ORAL_TABLET | Freq: Every day | ORAL | 1 refills | Status: DC

## 2018-01-27 NOTE — Progress Notes (Signed)
   Subjective:    Patient ID: Zachary [name], male    DOB: [birth year], 58 y.o.   MRN: [id number]  HPI He is here for evaluation of his blood pressure.  Over the last several months the readings in general have definitely been elevated.   Review of Systems     Objective:   Physical Exam Alert and in no distress otherwise not examined       Assessment & Plan:  Essential hypertension - Plan: lisinopril (PRINIVIL,ZESTRIL) 10 MG tablet I explained in the protocol behind this making the diagnosis of hypertension as well as the treatment.  Discussed sustained elevated blood pressure and risk of stroke, heart failure and kidney failure. I then discussed the medication that he is on and possible side effects from that.  He is to set up an appointment for the next month or 2.

## 2018-01-28 ENCOUNTER — Ambulatory Visit: Payer: Self-pay | Admitting: Family Medicine

## 2018-01-31 ENCOUNTER — Other Ambulatory Visit: Payer: Self-pay | Admitting: Psychiatry

## 2018-02-07 ENCOUNTER — Telehealth: Payer: Self-pay | Admitting: Family Medicine

## 2018-02-07 MED ORDER — LOSARTAN POTASSIUM 50 MG PO TABS: 50.0000 mg | ORAL_TABLET | Freq: Every day | ORAL | 3 refills | Status: DC

## 2018-02-07 NOTE — Telephone Encounter (Signed)
Let him know that I called a different medication in.

## 2018-02-07 NOTE — Telephone Encounter (Signed)
Pt called back to let you know that he is still on Lithium

## 2018-02-10 NOTE — Telephone Encounter (Signed)
Left message for pt

## 2018-02-17 ENCOUNTER — Encounter: Payer: Self-pay | Admitting: Psychiatry

## 2018-02-17 ENCOUNTER — Ambulatory Visit: Payer: 59 | Admitting: Psychiatry

## 2018-02-17 DIAGNOSIS — F331 Major depressive disorder, recurrent, moderate: Secondary | ICD-10-CM | POA: Diagnosis not present

## 2018-02-17 DIAGNOSIS — F4010 Social phobia, unspecified: Secondary | ICD-10-CM

## 2018-02-17 DIAGNOSIS — F422 Mixed obsessional thoughts and acts: Secondary | ICD-10-CM

## 2018-02-17 NOTE — Progress Notes (Signed)
Zachary [name] [id number] 1960-06-17 58 y.o.  Subjective:   Patient ID:  Zachary [name] is a 58 y.o. (DOB 02/20/1960) male.  Chief Complaint:  Chief Complaint  Patient presents with  . Follow-up    Medication Management   Last seen November 14 HPI Zachary [name] presents to the office today for follow-up of depression, obsessive anxiety and paranoia about body odor and getting dementia.  Did feel somewhat reassured about the fact that Dr. Marcos Eke said he does not have Alzheimer's dx.  Had been doing really well for awhile but the last week or 2 the farting returned and that bothers him and makes him anxious.  Makes him not want to be around people.  Started when told dad he didn't want to go out with family for dinner bc of it when that was a lie and then the problem returned.  Depression has remained under control.  Some hoarding tendencies with collections.   Overall he thinks the depression and anxiety levels have been "fine".  Friends notice his comprehension problems. Not sure he trusts the neuropsychological testing which was normal and did not show dementia as he fears.  Patient reports stable mood and denies depressed or irritable moods.  Patient denies any recent difficulty with anxiety other than mentioned.  Still suspicious that he will not do well at work if the demands increase.  Patient denies difficulty with sleep initiation or maintenance. Denies appetite disturbance.  Patient reports that energy and motivation have been good.  Patient denies any difficulty with concentration.  Patient denies any suicidal ideation.  Good interest and enjoyment.  Past psych med: Abilify, Zyprexa, Seroquel 600, W Wellbutrin, nortriptyline, mirtazapine, lithium no response, stimulants, buspirone 60, fluvoxamine  Review of Systems:  Review of Systems  Neurological: Negative for tremors and weakness.  Psychiatric/Behavioral: Negative for agitation, behavioral problems, confusion,  decreased concentration, dysphoric mood, hallucinations, self-injury, sleep disturbance and suicidal ideas. The patient is nervous/anxious. The patient is not hyperactive.     Medications: I have reviewed the patient's current medications.  Current Outpatient Medications  Medication Sig Dispense Refill  . ALPRAZolam (XANAX) 0.5 MG tablet Take 1 tablet (0.5 mg total) by mouth daily as needed. 30 tablet 2  . b complex vitamins capsule Take 1 capsule by mouth daily.    . busPIRone (BUSPAR) 30 MG tablet TAKE 1 TABLET(30 MG) BY MOUTH TWICE DAILY 60 tablet 2  . donepezil (ARICEPT) 10 MG tablet TAKE 1 TABLET(10 MG) BY MOUTH AT BEDTIME 90 tablet 0  . ferrous sulfate 325 (65 FE) MG EC tablet Take 325 mg by mouth 3 (three) times daily with meals.    . fluvoxaMINE (LUVOX) 100 MG tablet TAKE 1 TABLET BY MOUTH EVERY MORNING THEN TAKE 3 TABLETS BY MOUTH EVERY EVENING 360 tablet 0  . lamoTRIgine (LAMICTAL) 200 MG tablet TAKE 1 TABLET BY MOUTH EVERY DAY 90 tablet 0  . lithium carbonate 150 MG capsule Take 150 mg by mouth at bedtime.    Marland Kitchen losartan (COZAAR) 50 MG tablet Take 1 tablet (50 mg total) by mouth daily. 90 tablet 3  . Multiple Vitamin (MULTIVITAMIN WITH MINERALS) TABS Take 1 tablet by mouth daily.    Marland Kitchen OLANZapine (ZYPREXA) 20 MG tablet TAKE 1 TABLET BY MOUTH EVERY NIGHT AT BEDTIME 90 tablet 0   No current facility-administered medications for this visit.     Medication Side Effects: None  Allergies:  Allergies  Allergen Reactions  . Erythromycin     Pt unsure,  reports he recently received a -mycin drug with no reaction.   Marland Kitchen Penicillins     REACTION: "it may kill me"  . Tetracyclines & Related Hives    Past Medical History:  Diagnosis Date  . Allergy   . Anxiety   . Crohn disease ([place])    pt reports subsequent testing was normal  . Depression   . Herpes simplex   . IBS (irritable bowel syndrome)   . Incontinence     Family History  Problem Relation Age of Onset  . Arthritis  Mother   . Stroke Mother   . Depression Mother   . Hypertension Father   . Cancer Father 20       prostate cancer  . Prostate cancer Father   . Cancer Maternal Aunt   . Cancer Maternal Uncle   . Cancer Maternal Grandmother   . Cancer Maternal Grandfather   . Cancer Paternal Grandmother   . Cancer Paternal Uncle        prostate cancer  . Colon cancer Neg Hx   . Rectal cancer Neg Hx   . Stomach cancer Neg Hx     Social History   Socioeconomic History  . Marital status: Single    Spouse name: Not on file  . Number of children: Not on file  . Years of education: Not on file  . Highest education level: Not on file  Occupational History  . Not on file  Social Needs  . Financial resource strain: Not on file  . Food insecurity:    Worry: Not on file    Inability: Not on file  . Transportation needs:    Medical: Not on file    Non-medical: Not on file  Tobacco Use  . Smoking status: Never Smoker  . Smokeless tobacco: Never Used  Substance and Sexual Activity  . Alcohol use: No  . Drug use: No  . Sexual activity: Not Currently  Lifestyle  . Physical activity:    Days per week: Not on file    Minutes per session: Not on file  . Stress: Not on file  Relationships  . Social connections:    Talks on phone: Not on file    Gets together: Not on file    Attends religious service: Not on file    Active member of club or organization: Not on file    Attends meetings of clubs or organizations: Not on file    Relationship status: Not on file  . Intimate partner violence:    Fear of current or ex partner: Not on file    Emotionally abused: Not on file    Physically abused: Not on file    Forced sexual activity: Not on file  Other Topics Concern  . Not on file  Social History Narrative  . Not on file    Past Medical History, Surgical history, Social history, and Family history were reviewed and updated as appropriate.   Please see review of systems for further details  on the patient's review from today.   Objective:   Physical Exam:  There were no vitals taken for this visit.  Physical Exam Neurological:     Motor: No tremor.     Gait: Gait abnormal.  Psychiatric:        Attention and Perception: He is attentive.        Mood and Affect: Mood is anxious. Mood is not depressed. Affect is blunt. Affect is not angry.  Speech: Speech normal.        Behavior: Behavior normal.        Thought Content: Thought content is paranoid. Thought content does not include homicidal or suicidal ideation.        Judgment: Judgment normal.     Comments: Insight and judgment are fair. Overall paranoid tendency is better with the higher dosage of olanzapine, but he still is residual.     Lab Review:     Component Value Date/Time   NA 138 07/30/2013 0913   K 4.7 07/30/2013 0913   CL 102 07/30/2013 0913   CO2 26 07/30/2013 0913   GLUCOSE 89 07/30/2013 0913   BUN 10 07/30/2013 0913   CREATININE 0.91 07/30/2013 0913   CALCIUM 8.7 07/30/2013 0913   PROT 6.3 07/30/2013 0913   ALBUMIN 3.8 07/30/2013 0913   AST 28 07/30/2013 0913   ALT 38 07/30/2013 0913   ALKPHOS 64 07/30/2013 0913   BILITOT 0.5 07/30/2013 0913   GFRNONAA >60 09/22/2009 1045   GFRAA  09/22/2009 1045    >60        The eGFR has been calculated using the MDRD equation. This calculation has not been validated in all clinical situations. eGFR's persistently <60 mL/min signify possible Chronic Kidney Disease.       Component Value Date/Time   WBC 7.7 05/25/2016 0739   RBC 4.72 05/25/2016 0739   HGB 14.7 05/25/2016 0739   HCT 43.0 05/25/2016 0739   PLT 306 05/25/2016 0739   MCV 91.1 05/25/2016 0739   MCH 31.1 05/25/2016 0739   MCHC 34.2 05/25/2016 0739   RDW 13.6 05/25/2016 0739   LYMPHSABS 1,925 05/25/2016 0739   MONOABS 693 05/25/2016 0739   EOSABS 231 05/25/2016 0739   BASOSABS 77 05/25/2016 0739    No results found for: POCLITH, LITHIUM   No results found for:  PHENYTOIN, PHENOBARB, VALPROATE, CBMZ   .res Assessment: Plan:    Major depressive disorder, recurrent episode, moderate ([place])  Mixed obsessional thoughts and acts  Social anxiety disorder  Zachary [name] has been diagnosed with treatment resistant depression with psychotic features, OCD and social anxiety but schizoaffective may be more appropriate diagnosis given this chronic paranoid thinking.  Specifically he is chronically  suspicious that he has dementia despite neuropsychological testing which tells him he does not.  He also has chronic thoughts that other people find his odor offensive because of passing gas.  There is very little evidence to support this.  He has been under my psychiatric care for many many years and never owed noticed body odor and appointments at any time.  Overall his paranoia and anxiety and depression seem improved since the increase in olanzapine.  We will therefore continue his current dosages.  We extensively discussed the neuropsychological testing results from Dr. Marion Downer.  She met with him on October 17 and discussed the results.  In summary there was no indication for dementia.  Cognitive complaints were attributed to long-standing depression and anxiety.  His overall performance was within normal limits.  There were some relative weaknesses such as processing speed was low average but overall his results were normal.  The details are available in the chart. We have decided to continue the Aricept because of its protective effect given his long-term fear of dementia.  Not having any side effects.  Discussed the risk of serotonin syndrome with the writer than usual dose of fluvoxamine but again this appears to have been helpful for his anxiety and obsessive  thinking.  Discussed potential metabolic side effects associated with atypical antipsychotics, as well as potential risk for movement side effects. Advised pt to contact office if movement side effects occur.  No  evidence of movement disorder.  Discussed the risk of polypharmacy but again if appears medically necessary, helpful and well tolerated.  No med changes made today  FU 4 mos.  Lynder Parents, MD, DFAPA  Please see After Visit Summary for patient specific instructions.  Future Appointments  Date Time Provider [place]  03/03/2018  4:00 PM Denita Lung, MD [facility]  03/21/2018  8:30 AM Denita Lung, MD [facility] [place]  06/16/2018  4:30 PM Cottle, Billey Co., MD [facility] None    No orders of the defined types were placed in this encounter.     -------------------------------

## 2018-03-02 ENCOUNTER — Other Ambulatory Visit: Payer: Self-pay | Admitting: Psychiatry

## 2018-03-03 ENCOUNTER — Encounter: Payer: Self-pay | Admitting: Family Medicine

## 2018-03-03 ENCOUNTER — Ambulatory Visit (INDEPENDENT_AMBULATORY_CARE_PROVIDER_SITE_OTHER): Payer: 59 | Admitting: Family Medicine

## 2018-03-03 VITALS — BP 142/92 | HR 64 | Temp 97.7°F | Wt 250.6 lb

## 2018-03-03 DIAGNOSIS — M48061 Spinal stenosis, lumbar region without neurogenic claudication: Secondary | ICD-10-CM | POA: Insufficient documentation

## 2018-03-03 DIAGNOSIS — I10 Essential (primary) hypertension: Secondary | ICD-10-CM | POA: Diagnosis not present

## 2018-03-03 DIAGNOSIS — M4807 Spinal stenosis, lumbosacral region: Secondary | ICD-10-CM | POA: Diagnosis not present

## 2018-03-03 MED ORDER — LOSARTAN POTASSIUM-HCTZ 50-12.5 MG PO TABS: 1.0000 | ORAL_TABLET | Freq: Every day | ORAL | 1 refills | Status: DC

## 2018-03-03 NOTE — Progress Notes (Signed)
   Subjective:    Patient ID: Zachary [name], male    DOB: 05-22-1960, 58 y.o.   MRN: [id number]  HPI Is here for recheck on his blood pressure.  He also brought an MRI that he had done for a Workmen's Comp. related issue which did show evidence of spinal stenosis as well as bilateral foraminal stenosis.   Review of Systems     Objective:   Physical Exam Alert and in no distress.  Blood pressure is recorded.       Assessment & Plan:  Essential hypertension - Plan: losartan-hydrochlorothiazide (HYZAAR) 50-12.5 MG tablet I will place him on blood pressure medication and recheck here in 1 month.  He will follow-up concerning the back disease at a later date.

## 2018-03-06 ENCOUNTER — Encounter: Payer: 59 | Admitting: Psychology

## 2018-03-21 ENCOUNTER — Encounter: Payer: Self-pay | Admitting: Family Medicine

## 2018-04-01 ENCOUNTER — Other Ambulatory Visit: Payer: Self-pay | Admitting: Psychiatry

## 2018-04-03 ENCOUNTER — Ambulatory Visit: Payer: Self-pay | Admitting: Family Medicine

## 2018-04-15 ENCOUNTER — Telehealth: Payer: Self-pay | Admitting: Psychiatry

## 2018-04-15 NOTE — Telephone Encounter (Signed)
Pt called back. BP med is Losartan 50 mg 1/d  Taking about 2 months. Doctor advised him to notify us that it may have an effect on Lithium.  Call Pt @ [phone] with any questions.

## 2018-04-15 NOTE — Telephone Encounter (Signed)
Left pt. A Vm.

## 2018-04-15 NOTE — Telephone Encounter (Signed)
Patient called and said that he is on new blood pressure medicine that might interact with the lithium and the pcp said he might need to have levels checked. Please call him at [phone]

## 2018-04-16 ENCOUNTER — Other Ambulatory Visit: Payer: Self-pay | Admitting: Psychiatry

## 2018-04-16 DIAGNOSIS — F331 Major depressive disorder, recurrent, moderate: Secondary | ICD-10-CM

## 2018-04-16 NOTE — Progress Notes (Signed)
Patient started losartan.  Will check lithium level.

## 2018-04-16 NOTE — Telephone Encounter (Signed)
I left pt. A VM to return my call.

## 2018-04-16 NOTE — Telephone Encounter (Signed)
Please have patient go to any FYI there is a Agricultural consultant on the second floor of the building that came to be cafeteria [place].  But he could go to some other [facility] if he prefers.  [facility] to get a lithium level.  Remind him to get the blood test in the morning but he does not need to fast.  Just no lithium in the morning and then go get the blood test.

## 2018-04-18 ENCOUNTER — Other Ambulatory Visit: Payer: Self-pay | Admitting: Psychiatry

## 2018-04-18 NOTE — Telephone Encounter (Signed)
Pt aware and verbalized understanding of instructions.

## 2018-04-19 LAB — LITHIUM LEVEL: Lithium Lvl: <0.3 mmol/L — ABNORMAL LOW (ref 0.6–1.2)

## 2018-05-01 ENCOUNTER — Other Ambulatory Visit: Payer: Self-pay | Admitting: Psychiatry

## 2018-05-15 ENCOUNTER — Telehealth: Payer: Self-pay

## 2018-05-15 NOTE — Telephone Encounter (Signed)
LVM for pt to call office  To advise if we can move up the appointment up and if he feels comfortable coming in. [facility]

## 2018-05-20 ENCOUNTER — Encounter: Payer: Self-pay | Admitting: Family Medicine

## 2018-05-20 ENCOUNTER — Other Ambulatory Visit: Payer: Self-pay

## 2018-05-20 ENCOUNTER — Ambulatory Visit: Payer: Self-pay | Admitting: Family Medicine

## 2018-05-20 ENCOUNTER — Ambulatory Visit: Payer: 59 | Admitting: Family Medicine

## 2018-05-20 VITALS — BP 132/88 | HR 82 | Temp 96.8°F | Wt 243.6 lb

## 2018-05-20 DIAGNOSIS — I10 Essential (primary) hypertension: Secondary | ICD-10-CM

## 2018-05-20 MED ORDER — LOSARTAN POTASSIUM-HCTZ 100-12.5 MG PO TABS: 1.0000 | ORAL_TABLET | Freq: Every day | ORAL | 3 refills | Status: DC

## 2018-05-20 NOTE — Progress Notes (Signed)
   Subjective:    Patient ID: Zachary [name], male    DOB: Jul 23, 1960, 58 y.o.   MRN: [id number]  HPI He is here for recheck on his blood pressure.  He has been taking Hyzaar and having no difficulty with that.   Review of Systems     Objective:   Physical Exam Alert and in no distress.  Blood pressure is recorded.       Assessment & Plan:  Essential hypertension - Plan: losartan-hydrochlorothiazide (HYZAAR) 100-12.5 MG tablet I will increase his losartan.  Discussed possibly getting a BP cuff and checking his pressure at home.  If he does he is to bring it in with him otherwise I will see him in 1 month.

## 2018-06-16 ENCOUNTER — Other Ambulatory Visit: Payer: Self-pay

## 2018-06-16 ENCOUNTER — Ambulatory Visit: Payer: 59 | Admitting: Psychiatry

## 2018-06-16 ENCOUNTER — Encounter: Payer: Self-pay | Admitting: Psychiatry

## 2018-06-16 DIAGNOSIS — F4010 Social phobia, unspecified: Secondary | ICD-10-CM | POA: Diagnosis not present

## 2018-06-16 DIAGNOSIS — F3341 Major depressive disorder, recurrent, in partial remission: Secondary | ICD-10-CM

## 2018-06-16 DIAGNOSIS — F422 Mixed obsessional thoughts and acts: Secondary | ICD-10-CM

## 2018-06-16 DIAGNOSIS — F423 Hoarding disorder: Secondary | ICD-10-CM | POA: Diagnosis not present

## 2018-06-16 MED ORDER — ALPRAZOLAM 0.5 MG PO TABS: 0.5000 mg | ORAL_TABLET | Freq: Every day | ORAL | 2 refills | Status: DC | PRN

## 2018-06-16 NOTE — Progress Notes (Signed)
Zachary [name] [id number] 04-Feb-1960 58 y.o.  Subjective:   Patient ID:  Zachary [name] is a 58 y.o. (DOB 1960/05/30) male.  Chief Complaint:  Chief Complaint  Patient presents with  . Follow-up    Medication Management  . Depression    Medication Management  . Anxiety    Medication Management  . Other    OCD    Depression        Associated symptoms include no decreased concentration and no suicidal ideas.  Past medical history includes anxiety.   Anxiety Symptoms include nervous/anxious behavior. Patient reports no confusion, decreased concentration or suicidal ideas.     Zachary [name] presents to the office today for follow-up of depression, obsessive anxiety and paranoia about body odor and getting dementia.  Did feel somewhat reassured about the fact that Dr. Marcos Eke said he does not have Alzheimer's dx.  Last seen Feb without med changes.  Lithium level on ultra low dose was undetectable with BP meds.  Huge problem buying too much off the Internet.  Blew check from incentive on impulse purchase from the Internet.  Books and collectibles per usual.  Running up debt.  No other impulsivity.   Not hyper nor otherwise manic.    No history of bankruptcy.  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  Maybe out of boredom.  Needs to stop and can't control himself.Has not talked to anyone about it.  Doesn't get anxious around the spending.  Not depressed.  Busy at work.  Sleep fine.  Sometimes anxious about passing gas at work and not otherwise anxious there.  Had been doing really well for awhile but the last week or 2 the farting returned and that bothers him and makes him anxious.  Makes him not want to be around people.  Started when told dad he didn't want to go out with family for dinner bc of it when that was a lie and then the problem returned.  Depression has remained under control.  Some hoarding tendencies with collections.   Overall he thinks the depression and anxiety  levels have been "fine".  Friends notice his comprehension problems. Not sure he trusts the neuropsychological testing which was normal and did not show dementia as he fears.  Patient reports stable mood and denies depressed or irritable moods.  Patient denies any recent difficulty with anxiety other than mentioned.  Still suspicious that he will not do well at work if the demands increase.  Patient denies difficulty with sleep initiation or maintenance. Denies appetite disturbance.  Patient reports that energy and motivation have been good.  Patient denies any difficulty with concentration.  Patient denies any suicidal ideation.  Good interest and enjoyment.  F has unknown cancer in chemo in early 61's.   Talk every other day.  Past psych med: Abilify, Zyprexa, Seroquel 600, W Wellbutrin, nortriptyline, mirtazapine, lithium no response, stimulants, buspirone 60, fluvoxamine, Aricept   Review of Systems:  Review of Systems  Neurological: Negative for tremors and weakness.  Psychiatric/Behavioral: Positive for depression. Negative for agitation, behavioral problems, confusion, decreased concentration, dysphoric mood, hallucinations, self-injury, sleep disturbance and suicidal ideas. The patient is nervous/anxious. The patient is not hyperactive.     Medications: I have reviewed the patient's current medications.  Current Outpatient Medications  Medication Sig Dispense Refill  . ALPRAZolam (XANAX) 0.5 MG tablet Take 1 tablet (0.5 mg total) by mouth daily as needed. 30 tablet 2  . b complex vitamins capsule Take 1 capsule by  mouth daily.    . busPIRone (BUSPAR) 30 MG tablet TAKE 1 TABLET(30 MG) BY MOUTH TWICE DAILY 60 tablet 2  . donepezil (ARICEPT) 10 MG tablet TAKE 1 TABLET(10 MG) BY MOUTH AT BEDTIME 90 tablet 0  . ferrous sulfate 325 (65 FE) MG EC tablet Take 325 mg by mouth 3 (three) times daily with meals.    . fluvoxaMINE (LUVOX) 100 MG tablet TAKE 1 TABLET BY MOUTH EVERY MORNING THEN TAKE  3 TABLETS BY MOUTH EVERY EVENING 360 tablet 1  . lamoTRIgine (LAMICTAL) 200 MG tablet TAKE 1 TABLET BY MOUTH EVERY DAY 90 tablet 1  . lithium carbonate 150 MG capsule Take 150 mg by mouth at bedtime.    Marland Kitchen losartan-hydrochlorothiazide (HYZAAR) 100-12.5 MG tablet Take 1 tablet by mouth daily. 90 tablet 3  . Multiple Vitamin (MULTIVITAMIN WITH MINERALS) TABS Take 1 tablet by mouth daily.    Marland Kitchen OLANZapine (ZYPREXA) 20 MG tablet TAKE 1 TABLET BY MOUTH EVERY NIGHT AT BEDTIME 90 tablet 0   No current facility-administered medications for this visit.     Medication Side Effects: None  Allergies:  Allergies  Allergen Reactions  . Erythromycin     Pt unsure, reports he recently received a -mycin drug with no reaction.   Marland Kitchen Penicillins     REACTION: "it may kill me"  . Tetracyclines & Related Hives    Past Medical History:  Diagnosis Date  . Allergy   . Anxiety   . Crohn disease ([place])    pt reports subsequent testing was normal  . Depression   . Herpes simplex   . IBS (irritable bowel syndrome)   . Incontinence     Family History  Problem Relation Age of Onset  . Arthritis Mother   . Stroke Mother   . Depression Mother   . Hypertension Father   . Cancer Father 79       prostate cancer  . Prostate cancer Father   . Cancer Maternal Aunt   . Cancer Maternal Uncle   . Cancer Maternal Grandmother   . Cancer Maternal Grandfather   . Cancer Paternal Grandmother   . Cancer Paternal Uncle        prostate cancer  . Colon cancer Neg Hx   . Rectal cancer Neg Hx   . Stomach cancer Neg Hx     Social History   Socioeconomic History  . Marital status: Single    Spouse name: Not on file  . Number of children: Not on file  . Years of education: Not on file  . Highest education level: Not on file  Occupational History  . Not on file  Social Needs  . Financial resource strain: Not on file  . Food insecurity    Worry: Not on file    Inability: Not on file  . Transportation needs     Medical: Not on file    Non-medical: Not on file  Tobacco Use  . Smoking status: Never Smoker  . Smokeless tobacco: Never Used  Substance and Sexual Activity  . Alcohol use: No  . Drug use: No  . Sexual activity: Not Currently  Lifestyle  . Physical activity    Days per week: Not on file    Minutes per session: Not on file  . Stress: Not on file  Relationships  . Social Herbalist on phone: Not on file    Gets together: Not on file    Attends religious service: Not on  file    Active member of club or organization: Not on file    Attends meetings of clubs or organizations: Not on file    Relationship status: Not on file  . Intimate partner violence    Fear of current or ex partner: Not on file    Emotionally abused: Not on file    Physically abused: Not on file    Forced sexual activity: Not on file  Other Topics Concern  . Not on file  Social History Narrative  . Not on file    Past Medical History, Surgical history, Social history, and Family history were reviewed and updated as appropriate.   Please see review of systems for further details on the patient's review from today.   Objective:   Physical Exam:  There were no vitals taken for this visit.  Physical Exam Constitutional:      General: He is not in acute distress.    Appearance: He is well-developed.  Musculoskeletal:        General: No deformity.  Neurological:     Mental Status: He is alert and oriented to person, place, and time.     Coordination: Coordination normal.  Psychiatric:        Attention and Perception: Attention normal. He is attentive.        Mood and Affect: Mood is anxious. Mood is not depressed. Affect is blunt. Affect is not labile, angry or inappropriate.        Speech: Speech is not rapid and pressured, delayed or slurred.        Behavior: Behavior normal.        Thought Content: Thought content is paranoid. Thought content does not include homicidal or suicidal  ideation. Thought content does not include homicidal or suicidal plan.        Cognition and Memory: Cognition normal.        Judgment: Judgment is inappropriate.     Comments: Insight is fair. Chronic obs/paranoia about passing gas at work is borderline delusional. [employer] spending collectibles. Chronic hypoverbal     Lab Review:     Component Value Date/Time   NA 138 07/30/2013 0913   K 4.7 07/30/2013 0913   CL 102 07/30/2013 0913   CO2 26 07/30/2013 0913   GLUCOSE 89 07/30/2013 0913   BUN 10 07/30/2013 0913   CREATININE 0.91 07/30/2013 0913   CALCIUM 8.7 07/30/2013 0913   PROT 6.3 07/30/2013 0913   ALBUMIN 3.8 07/30/2013 0913   AST 28 07/30/2013 0913   ALT 38 07/30/2013 0913   ALKPHOS 64 07/30/2013 0913   BILITOT 0.5 07/30/2013 0913   GFRNONAA >60 09/22/2009 1045   GFRAA  09/22/2009 1045    >60        The eGFR has been calculated using the MDRD equation. This calculation has not been validated in all clinical situations. eGFR's persistently <60 mL/min signify possible Chronic Kidney Disease.       Component Value Date/Time   WBC 7.7 05/25/2016 0739   RBC 4.72 05/25/2016 0739   HGB 14.7 05/25/2016 0739   HCT 43.0 05/25/2016 0739   PLT 306 05/25/2016 0739   MCV 91.1 05/25/2016 0739   MCH 31.1 05/25/2016 0739   MCHC 34.2 05/25/2016 0739   RDW 13.6 05/25/2016 0739   LYMPHSABS 1,925 05/25/2016 0739   MONOABS 693 05/25/2016 0739   EOSABS 231 05/25/2016 0739   BASOSABS 77 05/25/2016 0739    Lithium Lvl  Date Value Ref Range Status  04/18/2018 <0.3 (L) 0.6 - 1.2 mmol/L Final    Comment:    Verified by repeat analysis. .      No results found for: PHENYTOIN, PHENOBARB, VALPROATE, CBMZ   .res Assessment: Plan:    Spyridon was seen today for follow-up, depression, anxiety and other.  Diagnoses and all orders for this visit:  Mixed obsessional thoughts and acts  Recurrent major depression in partial remission ([place])  Social anxiety  disorder  Hoarding disorder   [name] has been diagnosed with treatment resistant depression with psychotic features, OCD and social anxiety but schizoaffective may be more appropriate diagnosis given this chronic paranoid thinking.  Specifically he is chronically  suspicious that he has dementia despite neuropsychological testing which tells him he does not.  He also has chronic thoughts that other people find his odor offensive because of passing gas.  There is very little evidence to support this.  He has been under my psychiatric care for many many years and never owed noticed body odor and appointments at any time.  Overall his paranoia and anxiety and depression seem improved since the increase in olanzapine.  We will therefore continue his current dosages.  Discussed the risk of serotonin syndrome with the writer than usual dose of fluvoxamine but again this appears to have been helpful for his anxiety and obsessive thinking.  Discussed potential metabolic side effects associated with atypical antipsychotics, as well as potential risk for movement side effects. Advised pt to contact office if movement side effects occur.  No evidence of movement disorder.  Discussed the risk of polypharmacy but again if appears medically necessary, helpful and well tolerated.  No med changes likely to help excessive spending.  Becoming a Ship broker.  1 room you can't move in.  Discussed behavioral strategies such as going to a cash only means of spending on any nonemergency acquisitions.  Another strategy of picking a person to whom he would be accountable to regarding his spending such as his father or a trusted friend or potentially a Social worker.  He will consider these options.  No med changes made today  FU 4 mos.  Lynder Parents, MD, DFAPA  Please see After Visit Summary for patient specific instructions.  Future Appointments  Date Time Provider [place]  06/23/2018 11:15 AM Denita Lung, MD  [facility]  07/16/2018  9:30 AM Denita Lung, MD [facility]    No orders of the defined types were placed in this encounter.     -------------------------------

## 2018-06-18 ENCOUNTER — Telehealth: Payer: Self-pay

## 2018-06-18 NOTE — Telephone Encounter (Signed)
called pt to advise him of appt Monday and screening needed. No answer and LVM [place]

## 2018-06-23 ENCOUNTER — Ambulatory Visit: Payer: 59 | Admitting: Family Medicine

## 2018-06-23 ENCOUNTER — Other Ambulatory Visit: Payer: Self-pay

## 2018-06-23 ENCOUNTER — Encounter: Payer: Self-pay | Admitting: Family Medicine

## 2018-06-23 VITALS — BP 122/82 | HR 96 | Temp 98.1°F | Wt 240.6 lb

## 2018-06-23 DIAGNOSIS — I10 Essential (primary) hypertension: Secondary | ICD-10-CM

## 2018-06-23 NOTE — Progress Notes (Signed)
   Subjective:    Patient ID: Zachary [name], male    DOB: [birth year], 58 y.o.   MRN: [id number]  HPI He is here for follow-up on his blood pressure.  He is now taking Hyzaar and having no difficulty with that.   Review of Systems     Objective:   Physical Exam Alert and in no distress.  Pressure is recorded.       Assessment & Plan:  Essential hypertension -continue on present medication.

## 2018-06-30 ENCOUNTER — Other Ambulatory Visit: Payer: Self-pay | Admitting: Psychiatry

## 2018-07-16 ENCOUNTER — Encounter: Payer: Self-pay | Admitting: Family Medicine

## 2018-07-16 ENCOUNTER — Other Ambulatory Visit: Payer: Self-pay

## 2018-07-16 ENCOUNTER — Ambulatory Visit: Payer: 59 | Admitting: Family Medicine

## 2018-07-16 VITALS — BP 122/78 | HR 75 | Temp 97.6°F | Ht 75.0 in | Wt 241.2 lb

## 2018-07-16 DIAGNOSIS — Z8619 Personal history of other infectious and parasitic diseases: Secondary | ICD-10-CM

## 2018-07-16 DIAGNOSIS — R142 Eructation: Secondary | ICD-10-CM

## 2018-07-16 DIAGNOSIS — R32 Unspecified urinary incontinence: Secondary | ICD-10-CM

## 2018-07-16 DIAGNOSIS — I10 Essential (primary) hypertension: Secondary | ICD-10-CM | POA: Diagnosis not present

## 2018-07-16 DIAGNOSIS — Z0000 Encounter for general adult medical examination without abnormal findings: Secondary | ICD-10-CM

## 2018-07-16 DIAGNOSIS — R292 Abnormal reflex: Secondary | ICD-10-CM

## 2018-07-16 DIAGNOSIS — R141 Gas pain: Secondary | ICD-10-CM

## 2018-07-16 DIAGNOSIS — F329 Major depressive disorder, single episode, unspecified: Secondary | ICD-10-CM | POA: Diagnosis not present

## 2018-07-16 DIAGNOSIS — J302 Other seasonal allergic rhinitis: Secondary | ICD-10-CM | POA: Diagnosis not present

## 2018-07-16 DIAGNOSIS — K580 Irritable bowel syndrome with diarrhea: Secondary | ICD-10-CM

## 2018-07-16 DIAGNOSIS — M48061 Spinal stenosis, lumbar region without neurogenic claudication: Secondary | ICD-10-CM

## 2018-07-16 DIAGNOSIS — F429 Obsessive-compulsive disorder, unspecified: Secondary | ICD-10-CM

## 2018-07-16 DIAGNOSIS — R143 Flatulence: Secondary | ICD-10-CM

## 2018-07-16 MED ORDER — LOSARTAN POTASSIUM-HCTZ 100-12.5 MG PO TABS: 1.0000 | ORAL_TABLET | Freq: Every day | ORAL | 3 refills | Status: DC

## 2018-07-16 NOTE — Progress Notes (Addendum)
   Subjective:    Patient ID: Zachary [name], male    DOB: Oct 29, 1960, 58 y.o.   MRN: [id number]  HPI He is here for complete examination.  He initially mentioned something about doing a lot of tripping but then upon further questioning he realized that it was really related to him not wearing his glasses and tripping over things he did not see properly.  He continues have difficulty with diarrhea.  He usually has 3 stools in the morning and then does well the rest the day.  He still is having difficulty with excessive flatulence.  There is a questionable history of Crohn's disease however he states he was told that he had further testing and that was not occurring.  The last colonoscopy made no mention of that.  He has very little difficulty with allergies and has not had a herpes labialis outbreak in quite some time.  He is using Metamucil for this.  He continues to see Dr. Clovis Pu for dealing with his psychiatric issues.  This seems to be fairly stable.  Medications are reviewed and he is also taking lithium.  He states that is for prevention of Alzheimer's. He states that for the last 2 years he has had difficulty with urinary incontinence and is now wearing Depends.  He says that for no reason he can have urinary leakage.  He is noted no pain, urgency, dysuria.  Otherwise his family and social history as well as health maintenance and immunizations was reviewed.   Review of Systems  All other systems reviewed and are negative.      Objective:   Physical Exam Alert and in no distress.  DTRs are 1+ tympanic membranes and canals are normal. Pharyngeal area is normal. Neck is supple without adenopathy or thyromegaly. Cardiac exam shows a regular sinus rhythm without murmurs or gallops. Lungs are clear to auscultation.  Abdominal exam shows no masses or tenderness with normal bowel sounds.  Genital and rectal were deferred.  He was not able to produce a urine specimen.        Assessment &  Plan:  Routine general medical examination at a health care facility - Plan: CBC with Differential/Platelet, Comprehensive metabolic panel, Lipid panel,   Essential hypertension - Plan: CBC with Differential/Platelet, Comprehensive metabolic panel, continue losartan  Current episode of major depressive disorder without prior episode, unspecified depression episode severity - Plan: Continue follow-up with Dr. Clovis Pu  Seasonal allergies - Plan: Treat as needed  History of herpes labialis - Plan: Call if having an outbreak  Obsessive-compulsive disorder, unspecified type -   FLATULENCE ERUCTATION AND GAS PAIN - Plan: Discussed possible referral to GI if continued difficulty.  He will let me know if he is interested.  Irritable bowel syndrome with diarrhea - Plan: As above.  Also recommend a probiotic to see if that will help and continue with Metamucil.  Hyporeflexia - Plan: TSH,   Urinary incontinence, unspecified type - Plan: Ambulatory referral to Urology,

## 2018-07-17 LAB — CBC WITH DIFFERENTIAL/PLATELET
Basophils Absolute: 0.1 10*3/uL (ref 0.0–0.2)
Basos: 1 %
EOS (ABSOLUTE): 0.2 10*3/uL (ref 0.0–0.4)
Eos: 3 %
Hematocrit: 44.4 % (ref 37.5–51.0)
Hemoglobin: 15 g/dL (ref 13.0–17.7)
Immature Grans (Abs): 0 10*3/uL (ref 0.0–0.1)
Immature Granulocytes: 0 %
Lymphocytes Absolute: 1.7 10*3/uL (ref 0.7–3.1)
Lymphs: 22 %
MCH: 30.9 pg (ref 26.6–33.0)
MCHC: 33.8 g/dL (ref 31.5–35.7)
MCV: 92 fL (ref 79–97)
Monocytes Absolute: 0.6 10*3/uL (ref 0.1–0.9)
Monocytes: 8 %
Neutrophils Absolute: 5.1 10*3/uL (ref 1.4–7.0)
Neutrophils: 66 %
Platelets: 330 10*3/uL (ref 150–450)
RBC: 4.85 x10E6/uL (ref 4.14–5.80)
RDW: 12.6 % (ref 11.6–15.4)
WBC: 7.8 10*3/uL (ref 3.4–10.8)

## 2018-07-17 LAB — COMPREHENSIVE METABOLIC PANEL
ALT: 43 IU/L (ref 0–44)
AST: 31 IU/L (ref 0–40)
Albumin/Globulin Ratio: 1.8 (ref 1.2–2.2)
Albumin: 4.5 g/dL (ref 3.8–4.9)
Alkaline Phosphatase: 71 IU/L (ref 39–117)
BUN/Creatinine Ratio: 10 (ref 9–20)
BUN: 10 mg/dL (ref 6–24)
Bilirubin Total: 0.3 mg/dL (ref 0.0–1.2)
CO2: 21 mmol/L (ref 20–29)
Calcium: 9.5 mg/dL (ref 8.7–10.2)
Chloride: 101 mmol/L (ref 96–106)
Creatinine, Ser: 0.99 mg/dL (ref 0.76–1.27)
GFR calc Af Amer: 98 mL/min/{1.73_m2} (ref >59)
GFR calc non Af Amer: 85 mL/min/{1.73_m2} (ref >59)
Globulin, Total: 2.5 g/dL (ref 1.5–4.5)
Glucose: 105 mg/dL — ABNORMAL HIGH (ref 65–99)
Potassium: 4.3 mmol/L (ref 3.5–5.2)
Sodium: 138 mmol/L (ref 134–144)
Total Protein: 7 g/dL (ref 6.0–8.5)

## 2018-07-17 LAB — LIPID PANEL
Chol/HDL Ratio: 3.5 ratio (ref 0.0–5.0)
Cholesterol, Total: 186 mg/dL (ref 100–199)
HDL: 53 mg/dL (ref >39)
LDL Calculated: 101 mg/dL — ABNORMAL HIGH (ref 0–99)
Triglycerides: 161 mg/dL — ABNORMAL HIGH (ref 0–149)
VLDL Cholesterol Cal: 32 mg/dL (ref 5–40)

## 2018-07-17 LAB — TSH: TSH: 3.1 u[IU]/mL (ref 0.450–4.500)

## 2018-07-30 ENCOUNTER — Other Ambulatory Visit: Payer: Self-pay | Admitting: Psychiatry

## 2018-07-30 MED ORDER — LITHIUM CARBONATE 150 MG PO CAPS: 150.0000 mg | ORAL_CAPSULE | Freq: Every day | ORAL | 0 refills | Status: DC

## 2018-08-29 ENCOUNTER — Other Ambulatory Visit: Payer: Self-pay | Admitting: Psychiatry

## 2018-09-11 ENCOUNTER — Other Ambulatory Visit: Payer: Self-pay

## 2018-09-11 ENCOUNTER — Ambulatory Visit: Payer: 59 | Admitting: Family Medicine

## 2018-09-11 ENCOUNTER — Encounter: Payer: Self-pay | Admitting: Family Medicine

## 2018-09-11 VITALS — BP 142/90 | HR 66 | Temp 98.6°F | Wt 240.8 lb

## 2018-09-11 DIAGNOSIS — R5383 Other fatigue: Secondary | ICD-10-CM | POA: Diagnosis not present

## 2018-09-11 DIAGNOSIS — R292 Abnormal reflex: Secondary | ICD-10-CM | POA: Diagnosis not present

## 2018-09-11 NOTE — Progress Notes (Addendum)
   Subjective:    Patient ID: Zachary [name], male    DOB: 07-02-1960, 58 y.o.   MRN: [id number]  HPI He complains of a 2-week history of increased difficulty with fatigue and excessive sleepiness.  He has had no chest pain, shortness of breath, weakness, fever, chills, cough, congestion, skin or hair changes.  Normal bowel patterns.  He is under no undue stress.  He continues on medications listed in the chart.  No illicit drug use.   Review of Systems     Objective:   Physical Exam Alert and in no distress. Tympanic membranes and canals are normal. Pharyngeal area is normal. Neck is supple without adenopathy or thyromegaly. Cardiac exam shows a regular sinus rhythm without murmurs or gallops. Lungs are clear to auscultation. Skin is normal.  DTRs are diminished in upper and lower extremities.       Assessment & Plan:  Fatigue, unspecified type - Plan: CBC with Differential/Platelet, Comprehensive metabolic panel, TSH, Testosterone  Abnormal DTR (deep tendon reflex) - Plan: CBC with Differential/Platelet, Comprehensive metabolic panel, TSH, Testosterone Difficult to say what this is but routine blood work is certainly reasonable. The TSH was slightly elevated as was the TPO.  I will place him on Synthroid and recheck in 2 months.

## 2018-09-12 LAB — CBC WITH DIFFERENTIAL/PLATELET
Basophils Absolute: 0.1 10*3/uL (ref 0.0–0.2)
Basos: 1 %
EOS (ABSOLUTE): 0.3 10*3/uL (ref 0.0–0.4)
Eos: 3 %
Hematocrit: 43.3 % (ref 37.5–51.0)
Hemoglobin: 14.8 g/dL (ref 13.0–17.7)
Immature Grans (Abs): 0 10*3/uL (ref 0.0–0.1)
Immature Granulocytes: 0 %
Lymphocytes Absolute: 2.1 10*3/uL (ref 0.7–3.1)
Lymphs: 27 %
MCH: 31.4 pg (ref 26.6–33.0)
MCHC: 34.2 g/dL (ref 31.5–35.7)
MCV: 92 fL (ref 79–97)
Monocytes Absolute: 0.6 10*3/uL (ref 0.1–0.9)
Monocytes: 7 %
Neutrophils Absolute: 4.9 10*3/uL (ref 1.4–7.0)
Neutrophils: 62 %
Platelets: 363 10*3/uL (ref 150–450)
RBC: 4.72 x10E6/uL (ref 4.14–5.80)
RDW: 13 % (ref 11.6–15.4)
WBC: 8 10*3/uL (ref 3.4–10.8)

## 2018-09-12 LAB — COMPREHENSIVE METABOLIC PANEL
ALT: 37 IU/L (ref 0–44)
AST: 27 IU/L (ref 0–40)
Albumin/Globulin Ratio: 1.8 (ref 1.2–2.2)
Albumin: 4.6 g/dL (ref 3.8–4.9)
Alkaline Phosphatase: 75 IU/L (ref 39–117)
BUN/Creatinine Ratio: 7 — ABNORMAL LOW (ref 9–20)
BUN: 7 mg/dL (ref 6–24)
Bilirubin Total: 0.3 mg/dL (ref 0.0–1.2)
CO2: 25 mmol/L (ref 20–29)
Calcium: 9.8 mg/dL (ref 8.7–10.2)
Chloride: 101 mmol/L (ref 96–106)
Creatinine, Ser: 1.06 mg/dL (ref 0.76–1.27)
GFR calc Af Amer: 90 mL/min/{1.73_m2} (ref >59)
GFR calc non Af Amer: 78 mL/min/{1.73_m2} (ref >59)
Globulin, Total: 2.6 g/dL (ref 1.5–4.5)
Glucose: 74 mg/dL (ref 65–99)
Potassium: 4.9 mmol/L (ref 3.5–5.2)
Sodium: 139 mmol/L (ref 134–144)
Total Protein: 7.2 g/dL (ref 6.0–8.5)

## 2018-09-12 LAB — TSH: TSH: 5.59 u[IU]/mL — ABNORMAL HIGH (ref 0.450–4.500)

## 2018-09-12 LAB — TESTOSTERONE: Testosterone: 543 ng/dL (ref 264–916)

## 2018-09-15 ENCOUNTER — Ambulatory Visit: Payer: 59 | Admitting: Family Medicine

## 2018-09-18 LAB — SPECIMEN STATUS REPORT

## 2018-09-18 LAB — THYROID PEROXIDASE ANTIBODY: Thyroperoxidase Ab SerPl-aCnc: 53 IU/mL — ABNORMAL HIGH (ref 0–34)

## 2018-09-18 NOTE — Addendum Note (Signed)
Addended by: Denita Lung on: 09/18/2018 01:19 PM   Modules accepted: Orders

## 2018-09-23 ENCOUNTER — Ambulatory Visit: Payer: 59 | Admitting: Psychiatry

## 2018-09-26 ENCOUNTER — Telehealth: Payer: Self-pay | Admitting: Family Medicine

## 2018-09-26 MED ORDER — LEVOTHYROXINE SODIUM 75 MCG PO TABS: 75.0000 ug | ORAL_TABLET | Freq: Every day | ORAL | 3 refills | Status: DC

## 2018-09-26 NOTE — Telephone Encounter (Signed)
Pt left message that he was told rx was being sent in for him for his thyroid but he has not received anything  He uses  [facility] on [address]

## 2018-10-14 ENCOUNTER — Other Ambulatory Visit: Payer: Self-pay

## 2018-10-14 ENCOUNTER — Ambulatory Visit: Payer: 59 | Admitting: Family Medicine

## 2018-10-14 ENCOUNTER — Encounter: Payer: Self-pay | Admitting: Family Medicine

## 2018-10-14 VITALS — Wt 250.0 lb

## 2018-10-14 DIAGNOSIS — R6883 Chills (without fever): Secondary | ICD-10-CM

## 2018-10-14 DIAGNOSIS — J029 Acute pharyngitis, unspecified: Secondary | ICD-10-CM | POA: Diagnosis not present

## 2018-10-14 NOTE — Progress Notes (Signed)
   Subjective:  Documentation for virtual audio and video telecommunications through [place] encounter:  The patient was located at home. 2 patient identifiers used.  The provider was located in the office. The patient did consent to this visit and is aware of possible charges through their insurance for this visit.  The other persons participating in this telemedicine service were none.    Patient ID: Zachary [name], male    DOB: 1960/05/29, 58 y.o.   MRN: [id number]  HPI Chief Complaint  Patient presents with  . sore throat    sore throat since yesterday and chills x couple hours    Complains of sore throat x 24 hours and just developed chills 2 hours ago. States he was having nasal drainge and coughing yesterday but this seems to be improved. Denies difficulty swallowing.  Underlying allergies.  Is not currently treating his allergies.  States he got tested for COVID-19 at [facility] yesterday and it was negative.  States several people at work have been diagnosed but he does not think he has had direct exposure.  Denies fever, headache, dizziness, chest pain, palpitations, shortness of breath, abdominal pain, N/V/D, urinary symptoms.   No loss of taste or smell.   Reviewed allergies, medications, past medical, surgical, family, and social history.     Review of Systems Pertinent positives and negatives in the history of present illness.     Objective:   Physical Exam Wt 250 lb (113.4 kg)   BMI 31.25 kg/m   Alert and oriented and in no acute distress.  Normal speech.  Denies difficulty swallowing      Assessment & Plan:  Acute pharyngitis, unspecified etiology  Chills  No red flag symptoms.  Discussed symptomatic treatment with salt water gargles, Aleve, Chloraseptic and staying well-hydrated.  He will continue to monitor his symptoms over the next few days and report back if he is getting significantly worse or not much improved. Reports exposure  to COVID-19 through third party but no direct exposure.  States he was tested yesterday and negative, this was a rapid test per patient.  Time spent on call was 11 minutes and in review of previous records 2 minutes total.  This virtual service is not related to other E/M service within previous 7 days.

## 2018-10-16 ENCOUNTER — Ambulatory Visit (INDEPENDENT_AMBULATORY_CARE_PROVIDER_SITE_OTHER): Payer: 59 | Admitting: Psychiatry

## 2018-10-16 ENCOUNTER — Other Ambulatory Visit: Payer: Self-pay

## 2018-10-16 ENCOUNTER — Encounter: Payer: Self-pay | Admitting: Psychiatry

## 2018-10-16 DIAGNOSIS — F422 Mixed obsessional thoughts and acts: Secondary | ICD-10-CM

## 2018-10-16 DIAGNOSIS — F3341 Major depressive disorder, recurrent, in partial remission: Secondary | ICD-10-CM

## 2018-10-16 DIAGNOSIS — F4010 Social phobia, unspecified: Secondary | ICD-10-CM | POA: Diagnosis not present

## 2018-10-16 DIAGNOSIS — F423 Hoarding disorder: Secondary | ICD-10-CM

## 2018-10-16 MED ORDER — NALTREXONE HCL 50 MG PO TABS: 50.0000 mg | ORAL_TABLET | Freq: Every day | ORAL | 2 refills | Status: DC

## 2018-10-16 MED ORDER — ALPRAZOLAM 0.5 MG PO TABS: 0.5000 mg | ORAL_TABLET | Freq: Every day | ORAL | 2 refills | Status: DC | PRN

## 2018-10-16 NOTE — Progress Notes (Signed)
Zachary [name] [id number] 1960/02/20 58 y.o.  Subjective:   Patient ID:  Zachary [name] is a 58 y.o. (DOB [birth year]) male.  Chief Complaint:  Chief Complaint  Patient presents with  . Follow-up    Medication Management  . Depression    Medication Management  . Anxiety    Medication Management  . impulse control    Depression        Associated symptoms include no decreased concentration and no suicidal ideas.  Past medical history includes anxiety.   Anxiety Symptoms include nervous/anxious behavior. Patient reports no confusion, decreased concentration, dizziness or suicidal ideas.     Zachary [name] presents to the office today for follow-up of depression, obsessive anxiety and paranoia about body odor and getting dementia.  Did feel somewhat reassured about the fact that Dr. Marcos Eke said he does not have Alzheimer's dx.  Last seen June without med changes.    Lithium level on ultra low dose was undetectable with BP meds.  The same overall with mental health issues.  Nice new boss.   Worries over his thoughts of patient.  Mood stable.    Continued concerns over spending on things he doesn't need or even really want.  Books he'll never read or DVDs he'll never watch. Huge problem buying too much off the Internet.  Blew check from incentive on impulse purchase from the Internet.  Books and collectibles per usual.  Running up debt.  No other impulsivity.   Not hyper nor otherwise manic.    No history of bankruptcy.  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  Maybe out of boredom.  Needs to stop and can't control himself.Has not talked to anyone about it.  Doesn't get anxious around the spending.  Not depressed.  Busy at work.  Sleep fine.  Sometimes anxious about passing gas at work and not otherwise anxious there.  Asks for prn Xnax.   Depression has remained under control.  Some hoarding tendencies with collections.   Overall he thinks the depression and anxiety levels  have been "fine".  Friends notice his comprehension problems. Not sure he trusts the neuropsychological testing which was normal and did not show dementia as he fears.  Patient reports stable mood and denies depressed or irritable moods.  Patient denies any recent difficulty with anxiety other than mentioned.  Still suspicious that he will not do well at work if the demands increase.  Patient denies difficulty with sleep initiation or maintenance. Denies appetite disturbance.  Patient reports that energy and motivation have been good.  Patient denies any difficulty with concentration.  Patient denies any suicidal ideation.  Good interest and enjoyment.  F has unknown cancer in chemo in early 8's.   Talk every other day.  Past psych med: Abilify, Zyprexa, Seroquel 600, W Wellbutrin, nortriptyline, mirtazapine, lithium no response, stimulants, buspirone 60, fluvoxamine, Aricept   Review of Systems:  Review of Systems  Neurological: Negative for dizziness, tremors and weakness.  Psychiatric/Behavioral: Positive for depression. Negative for agitation, behavioral problems, confusion, decreased concentration, dysphoric mood, hallucinations, self-injury, sleep disturbance and suicidal ideas. The patient is nervous/anxious. The patient is not hyperactive.     Medications: I have reviewed the patient's current medications.  Current Outpatient Medications  Medication Sig Dispense Refill  . ALPRAZolam (XANAX) 0.5 MG tablet Take 1 tablet (0.5 mg total) by mouth daily as needed. 30 tablet 2  . b complex vitamins capsule Take 1 capsule by mouth daily.    . busPIRone (  BUSPAR) 30 MG tablet TAKE 1 TABLET(30 MG) BY MOUTH TWICE DAILY 60 tablet 5  . donepezil (ARICEPT) 10 MG tablet TAKE 1 TABLET(10 MG) BY MOUTH AT BEDTIME 90 tablet 0  . ferrous sulfate 325 (65 FE) MG EC tablet Take 325 mg by mouth 3 (three) times daily with meals.    . fluvoxaMINE (LUVOX) 100 MG tablet TAKE 1 TABLET BY MOUTH EVERY MORNING THEN  TAKE 3 TABLETS BY MOUTH EVERY EVENING 360 tablet 0  . lamoTRIgine (LAMICTAL) 200 MG tablet TAKE 1 TABLET BY MOUTH EVERY DAY 90 tablet 0  . levothyroxine (SYNTHROID) 75 MCG tablet Take 1 tablet (75 mcg total) by mouth daily. 90 tablet 3  . lithium carbonate 150 MG capsule Take 1 capsule (150 mg total) by mouth at bedtime. 90 capsule 0  . losartan-hydrochlorothiazide (HYZAAR) 100-12.5 MG tablet Take 1 tablet by mouth daily. 90 tablet 3  . Multiple Vitamin (MULTIVITAMIN WITH MINERALS) TABS Take 1 tablet by mouth daily.    Marland Kitchen OLANZapine (ZYPREXA) 20 MG tablet TAKE 1 TABLET BY MOUTH EVERY NIGHT AT BEDTIME 90 tablet 0  . naltrexone (DEPADE) 50 MG tablet Take 1 tablet (50 mg total) by mouth daily. 30 tablet 2   No current facility-administered medications for this visit.     Medication Side Effects: None  Allergies:  Allergies  Allergen Reactions  . Erythromycin     Pt unsure, reports he recently received a -mycin drug with no reaction.   Marland Kitchen Penicillins     REACTION: "it may kill me"  . Tetracyclines & Related Hives    Past Medical History:  Diagnosis Date  . Allergy   . Anxiety   . Crohn disease ([place])    pt reports subsequent testing was normal  . Depression   . Herpes simplex   . IBS (irritable bowel syndrome)   . Incontinence     Family History  Problem Relation Age of Onset  . Arthritis Mother   . Stroke Mother   . Depression Mother   . Hypertension Father   . Cancer Father 70       prostate cancer  . Prostate cancer Father   . Cancer Maternal Aunt   . Cancer Maternal Uncle   . Cancer Maternal Grandmother   . Cancer Maternal Grandfather   . Cancer Paternal Grandmother   . Cancer Paternal Uncle        prostate cancer  . Colon cancer Neg Hx   . Rectal cancer Neg Hx   . Stomach cancer Neg Hx     Social History   Socioeconomic History  . Marital status: Single    Spouse name: Not on file  . Number of children: Not on file  . Years of education: Not on file  .  Highest education level: Not on file  Occupational History  . Not on file  Social Needs  . Financial resource strain: Not on file  . Food insecurity    Worry: Not on file    Inability: Not on file  . Transportation needs    Medical: Not on file    Non-medical: Not on file  Tobacco Use  . Smoking status: Never Smoker  . Smokeless tobacco: Never Used  Substance and Sexual Activity  . Alcohol use: No  . Drug use: No  . Sexual activity: Not Currently  Lifestyle  . Physical activity    Days per week: Not on file    Minutes per session: Not on file  .  Stress: Not on file  Relationships  . Social Herbalist on phone: Not on file    Gets together: Not on file    Attends religious service: Not on file    Active member of club or organization: Not on file    Attends meetings of clubs or organizations: Not on file    Relationship status: Not on file  . Intimate partner violence    Fear of current or ex partner: Not on file    Emotionally abused: Not on file    Physically abused: Not on file    Forced sexual activity: Not on file  Other Topics Concern  . Not on file  Social History Narrative  . Not on file    Past Medical History, Surgical history, Social history, and Family history were reviewed and updated as appropriate.   Please see review of systems for further details on the patient's review from today.   Objective:   Physical Exam:  There were no vitals taken for this visit.  Physical Exam Constitutional:      General: He is not in acute distress.    Appearance: He is well-developed.  Musculoskeletal:        General: No deformity.  Neurological:     Mental Status: He is alert and oriented to person, place, and time.     Coordination: Coordination normal.  Psychiatric:        Attention and Perception: Attention normal. He is attentive.        Mood and Affect: Mood is anxious. Mood is not depressed. Affect is blunt. Affect is not labile, angry or  inappropriate.        Speech: Speech is not rapid and pressured, delayed or slurred.        Behavior: Behavior normal. Behavior is not agitated.        Thought Content: Thought content is paranoid. Thought content does not include homicidal or suicidal ideation. Thought content does not include homicidal or suicidal plan.        Cognition and Memory: Cognition normal.        Judgment: Judgment is inappropriate.     Comments: Insight is fair. Chronic obs/paranoia about passing gas at work is borderline delusional. [employer] spending collectibles. Chronic hypoverbal     Lab Review:     Component Value Date/Time   NA 139 09/11/2018 1120   K 4.9 09/11/2018 1120   CL 101 09/11/2018 1120   CO2 25 09/11/2018 1120   GLUCOSE 74 09/11/2018 1120   GLUCOSE 89 07/30/2013 0913   BUN 7 09/11/2018 1120   CREATININE 1.06 09/11/2018 1120   CREATININE 0.91 07/30/2013 0913   CALCIUM 9.8 09/11/2018 1120   PROT 7.2 09/11/2018 1120   ALBUMIN 4.6 09/11/2018 1120   AST 27 09/11/2018 1120   ALT 37 09/11/2018 1120   ALKPHOS 75 09/11/2018 1120   BILITOT 0.3 09/11/2018 1120   GFRNONAA 78 09/11/2018 1120   GFRAA 90 09/11/2018 1120       Component Value Date/Time   WBC 8.0 09/11/2018 1120   WBC 7.7 05/25/2016 0739   RBC 4.72 09/11/2018 1120   RBC 4.72 05/25/2016 0739   HGB 14.8 09/11/2018 1120   HCT 43.3 09/11/2018 1120   PLT 363 09/11/2018 1120   MCV 92 09/11/2018 1120   MCH 31.4 09/11/2018 1120   MCH 31.1 05/25/2016 0739   MCHC 34.2 09/11/2018 1120   MCHC 34.2 05/25/2016 0739   RDW 13.0 09/11/2018 1120  LYMPHSABS 2.1 09/11/2018 1120   MONOABS 693 05/25/2016 0739   EOSABS 0.3 09/11/2018 1120   BASOSABS 0.1 09/11/2018 1120    Lithium Lvl  Date Value Ref Range Status  04/18/2018 <0.3 (L) 0.6 - 1.2 mmol/L Final    Comment:    Verified by repeat analysis. .      No results found for: PHENYTOIN, PHENOBARB, VALPROATE, CBMZ   .res Assessment: Plan:    Zachary [name] was seen today for  follow-up, depression, anxiety and impulse control.  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 -     naltrexone (DEPADE) 50 MG tablet; Take 1 tablet (50 mg total) by mouth daily.   Zachary [name] has been diagnosed with treatment resistant depression with psychotic features, OCD and social anxiety but schizoaffective may be more appropriate diagnosis given this chronic paranoid thinking.  Specifically he is chronically  suspicious that he has dementia despite neuropsychological testing which tells him he does not.  He also has chronic thoughts that other people find his odor offensive because of passing gas.  There is very little evidence to support this.  He has been under my psychiatric care for many many years and never owed noticed body odor and appointments at any time.  He also suspects that people are thinking negatively of him regularly.  Overall his paranoia and anxiety and depression seem improved since the increase in olanzapine.  We will therefore continue his current dosages.  Discussed the risk of serotonin syndrome with the writer than usual dose of fluvoxamine but again this appears to have been helpful for his anxiety and obsessive thinking.  Discussed potential metabolic side effects associated with atypical antipsychotics, as well as potential risk for movement side effects. Advised pt to contact office if movement side effects occur.  No evidence of movement disorder.  Discussed the risk of polypharmacy but again if appears medically necessary, helpful and well tolerated.  He has to try something medically because of his compulsive spending that again does not appear manic.  Trial off lablel naltrexone 50 mg HS for impulse buying that does  not appear manic.  FU 4 mos.  Lynder Parents, MD, DFAPA  Please see After Visit Summary for patient specific instructions.  No future appointments.  No orders of the defined types were placed in this encounter.     -------------------------------

## 2018-10-28 ENCOUNTER — Other Ambulatory Visit: Payer: Self-pay | Admitting: Psychiatry

## 2018-11-25 ENCOUNTER — Ambulatory Visit: Payer: 59 | Admitting: Family Medicine

## 2018-11-25 ENCOUNTER — Encounter: Payer: Self-pay | Admitting: Family Medicine

## 2018-11-25 ENCOUNTER — Other Ambulatory Visit: Payer: Self-pay

## 2018-11-25 VITALS — Temp 98.0°F | Wt 250.0 lb

## 2018-11-25 DIAGNOSIS — K219 Gastro-esophageal reflux disease without esophagitis: Secondary | ICD-10-CM

## 2018-11-25 NOTE — Progress Notes (Signed)
   Subjective:    Patient ID: Zachary [name], male    DOB: 01/29/1960, 58 y.o.   MRN: [id number]  HPI Documentation for virtual telephone encounter. Interactive audio and video telecommunications were attempted between this provider and patient, however he did not have access to video capability.  We continued and completed visit with audio only.  The patient was located at home. The provider was located in the office. The patient did consent to this visit and is aware of possible charges through their insurance for this visit.  The other persons participating in this telemedicine service were none. This virtual service is not related to other E/M service within previous 7 days.  He states that he has a several year history of a gagging sensation when he awakes in the morning and rare vomiting.  He does not give any other reflux type symptoms and states that no food has any particular bearing on this.  He does not have any symptoms during the day when he is up and more active.   Review of Systems     Objective:   Physical Exam Alert and in no distress otherwise not examined       Assessment & Plan:  Gastroesophageal reflux disease, unspecified whether esophagitis present I explained that his symptoms seem to be most consistent with reflux disease and recommend Prilosec or Nexium regularly for the next month.  If this works, he can cut back on the dosing regimen to every other day and potentially on an as-needed basis.  He was comfortable with that.

## 2018-11-27 ENCOUNTER — Other Ambulatory Visit: Payer: Self-pay | Admitting: Psychiatry

## 2018-12-22 ENCOUNTER — Ambulatory Visit: Payer: 59 | Admitting: Family Medicine

## 2018-12-22 ENCOUNTER — Other Ambulatory Visit: Payer: Self-pay

## 2018-12-22 ENCOUNTER — Encounter: Payer: Self-pay | Admitting: Family Medicine

## 2018-12-22 VITALS — BP 122/86 | HR 74 | Temp 97.3°F | Wt 254.4 lb

## 2018-12-22 DIAGNOSIS — R7989 Other specified abnormal findings of blood chemistry: Secondary | ICD-10-CM | POA: Diagnosis not present

## 2018-12-22 DIAGNOSIS — R141 Gas pain: Secondary | ICD-10-CM

## 2018-12-22 DIAGNOSIS — B351 Tinea unguium: Secondary | ICD-10-CM | POA: Diagnosis not present

## 2018-12-22 DIAGNOSIS — H5711 Ocular pain, right eye: Secondary | ICD-10-CM

## 2018-12-22 DIAGNOSIS — R143 Flatulence: Secondary | ICD-10-CM | POA: Diagnosis not present

## 2018-12-22 DIAGNOSIS — R142 Eructation: Secondary | ICD-10-CM

## 2018-12-22 NOTE — Progress Notes (Signed)
   Subjective:    Patient ID: Zachary [name], male    DOB: 1960-08-02, 58 y.o.   MRN: [id number]  HPI He is here for consultation concerning multiple issues.  He has thickening of the great toenail and has concerns over that.  He also has a 1 day history of intermittent right eye pain.  He usually gets it twice per hour and goes away fairly quickly.  There has been no discharge, redness of the eyes, blurred or double vision, sneezing, itchy watery eyes.  He also has a previous history of difficulty with flatulence and is concerned over the possibility of pancreatic insufficiency.  He has seen GI in the past.  Also recently he did have an elevated TSH and I put him on 75 mcg of Synthroid.  He is here for follow-up on that.   Review of Systems     Objective:   Physical Exam Alert and in no distress.  Exam of the right eye shows no swelling, erythema or tenderness palpation of the eyelids.  Sclera appear normal. Exam of the right great toe does show thickening and discoloration of the nail however no other nails are involved.       Assessment & Plan:  High serum thyroid stimulating hormone (TSH) - Plan: TSH, Thyroid Peroxidase Antibodies (TPO) (REFL)  FLATULENCE ERUCTATION AND GAS PAIN: Recommend he follow-up with GI concerning the possibility of this being pancreatic insufficiency. Pain of right eye: I reassured him that I did not find nothing wrong with the eye and treat conservatively.  Onychomycosis::   Discussed treatment of onychomycosis with medications and the fact that it takes at least 3 to 6 months and can recur.  He decided to not treat it.

## 2018-12-23 LAB — THYROID PEROXIDASE ANTIBODY: Thyroperoxidase Ab SerPl-aCnc: 49 IU/mL — ABNORMAL HIGH (ref 0–34)

## 2018-12-23 LAB — TSH: TSH: 2.77 u[IU]/mL (ref 0.450–4.500)

## 2018-12-27 ENCOUNTER — Other Ambulatory Visit: Payer: Self-pay | Admitting: Psychiatry

## 2019-01-16 ENCOUNTER — Other Ambulatory Visit: Payer: Self-pay

## 2019-01-16 ENCOUNTER — Ambulatory Visit: Payer: 59 | Admitting: Psychiatry

## 2019-01-16 ENCOUNTER — Ambulatory Visit: Payer: 59 | Admitting: Medical

## 2019-01-16 ENCOUNTER — Encounter: Payer: Self-pay | Admitting: Medical

## 2019-01-16 ENCOUNTER — Ambulatory Visit (INDEPENDENT_AMBULATORY_CARE_PROVIDER_SITE_OTHER): Payer: 59 | Admitting: Psychiatry

## 2019-01-16 ENCOUNTER — Encounter: Payer: Self-pay | Admitting: Psychiatry

## 2019-01-16 VITALS — BP 130/82 | HR 93 | Temp 97.6°F | Ht 75.0 in | Wt 252.6 lb

## 2019-01-16 DIAGNOSIS — F3341 Major depressive disorder, recurrent, in partial remission: Secondary | ICD-10-CM

## 2019-01-16 DIAGNOSIS — F4010 Social phobia, unspecified: Secondary | ICD-10-CM

## 2019-01-16 DIAGNOSIS — F422 Mixed obsessional thoughts and acts: Secondary | ICD-10-CM | POA: Diagnosis not present

## 2019-01-16 DIAGNOSIS — F423 Hoarding disorder: Secondary | ICD-10-CM | POA: Diagnosis not present

## 2019-01-16 DIAGNOSIS — N50811 Right testicular pain: Secondary | ICD-10-CM | POA: Diagnosis not present

## 2019-01-16 LAB — POCT URINALYSIS DIP (PROADVANTAGE DEVICE)
Bilirubin, UA: NEGATIVE
Blood, UA: NEGATIVE
Glucose, UA: NEGATIVE mg/dL
Ketones, POC UA: NEGATIVE mg/dL
Leukocytes, UA: NEGATIVE
Nitrite, UA: NEGATIVE
Protein Ur, POC: NEGATIVE mg/dL
Specific Gravity, Urine: 1
Urobilinogen, Ur: NEGATIVE
pH, UA: 6 (ref 5.0–8.0)
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
## 2019-01-16 NOTE — Progress Notes (Signed)
Zachary [name] [id number] 1960/07/01 59 y.o.  Subjective:   Patient ID:  Zachary [name] is a 59 y.o. (DOB 17-Jun-1960) male.  Chief Complaint:  Chief Complaint  Patient presents with  . Follow-up    Medication Management  . Depression    Medication Management  . Other    OCD    Depression        Associated symptoms include no decreased concentration and no suicidal ideas.  Past medical history includes anxiety.   Anxiety Symptoms include nervous/anxious behavior. Patient reports no confusion, decreased concentration, dizziness or suicidal ideas.     Zachary [name] presents to the office today for follow-up of depression, obsessive anxiety and paranoia about body odor and getting dementia.  Did feel somewhat reassured about the fact that Dr. Marcos Eke said he does not have Alzheimer's dx.  Last visit October 16, 2018.  Initiated a trial of naltrexone off label 50 mg daily for impulse control purchasing.  No benefit naltrexone.  Doesn't like his job but fears change in jobs bc "of the farting" and doesn't feel he'd be accepted in other places.  In this job for 16 years without a promotion.  There have bbeen layoffs and he hasn't gotten layoffs.    The same overall with mental health issues.  Nice new boss.   Worries over his thoughts of patient.  Mood stable.    Continued concerns over spending on things he doesn't need or even really want.  Books he'll never read or DVDs he'll never watch. Huge problem buying too much off the Internet.  Blew check from incentive on impulse purchase from the Internet.  Books and collectibles per usual.  Running up debt.  No other impulsivity.   Not hyper nor otherwise manic.    No history of bankruptcy.  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  Maybe out of boredom.  Needs to stop and can't control himself.Has not talked to anyone about it.  Doesn't get anxious around the spending.  Not depressed.  Busy at work.  Sleep fine.  Sometimes anxious  about passing gas at work and not otherwise anxious there.  Rarely used Xanax.   Depression has remained under control. Overall he thinks the depression and anxiety levels have been "fine".  Friends notice his comprehension problems. Not sure he trusts the neuropsychological testing which was normal and did not show dementia as he fears.  Patient reports stable mood and denies depressed or irritable moods.  Patient denies any recent difficulty with anxiety other than mentioned.  Still suspicious that he will not do well at work if the demands increase.  Patient denies difficulty with sleep initiation or maintenance. Denies appetite disturbance.  Patient reports that energy and motivation have been good.  Patient denies any difficulty with concentration.  Patient denies any suicidal ideation.  Good interest and enjoyment.  F has unknown cancer in chemo in early 75's.   Talk every other day.  Past psych med: Abilify, Zyprexa, Seroquel 600, W Wellbutrin, nortriptyline, mirtazapine, lithium no response, stimulants, buspirone 60, fluvoxamine, Aricept   Review of Systems:  Review of Systems  Neurological: Negative for dizziness, tremors and weakness.  Psychiatric/Behavioral: Positive for depression. Negative for agitation, behavioral problems, confusion, decreased concentration, dysphoric mood, hallucinations, self-injury, sleep disturbance and suicidal ideas. The patient is nervous/anxious. The patient is not hyperactive.     Medications: I have reviewed the patient's current medications.  Current Outpatient Medications  Medication Sig Dispense Refill  . ALPRAZolam (  XANAX) 0.5 MG tablet Take 1 tablet (0.5 mg total) by mouth daily as needed. 30 tablet 2  . busPIRone (BUSPAR) 30 MG tablet TAKE 1 TABLET(30 MG) BY MOUTH TWICE DAILY 60 tablet 5  . donepezil (ARICEPT) 10 MG tablet TAKE 1 TABLET(10 MG) BY MOUTH AT BEDTIME 90 tablet 0  . fluvoxaMINE (LUVOX) 100 MG tablet TAKE 1 TABLET BY MOUTH EVERY  MORNING THEN TAKE 3 TABLETS BY MOUTH EVERY EVENING 360 tablet 0  . lamoTRIgine (LAMICTAL) 200 MG tablet TAKE 1 TABLET BY MOUTH EVERY DAY 90 tablet 0  . levothyroxine (SYNTHROID) 75 MCG tablet Take 1 tablet (75 mcg total) by mouth daily. 90 tablet 3  . lithium carbonate 150 MG capsule TAKE 1 CAPSULE(150 MG) BY MOUTH AT BEDTIME 90 capsule 0  . losartan-hydrochlorothiazide (HYZAAR) 100-12.5 MG tablet Take 1 tablet by mouth daily. 90 tablet 3  . Multiple Vitamin (MULTIVITAMIN WITH MINERALS) TABS Take 1 tablet by mouth daily.    . naltrexone (DEPADE) 50 MG tablet Take 1 tablet (50 mg total) by mouth daily. 30 tablet 2  . OLANZapine (ZYPREXA) 20 MG tablet TAKE 1 TABLET BY MOUTH EVERY NIGHT AT BEDTIME 90 tablet 0   No current facility-administered medications for this visit.    Medication Side Effects: None  Allergies:  Allergies  Allergen Reactions  . Erythromycin     Pt unsure, reports he recently received a -mycin drug with no reaction.   Marland Kitchen Penicillins     REACTION: "it may kill me"  . Tetracyclines & Related Hives    Past Medical History:  Diagnosis Date  . Allergy   . Anxiety   . Crohn disease ([place])    pt reports subsequent testing was normal  . Depression   . Herpes simplex   . IBS (irritable bowel syndrome)   . Incontinence     Family History  Problem Relation Age of Onset  . Arthritis Mother   . Stroke Mother   . Depression Mother   . Hypertension Father   . Cancer Father 94       prostate cancer  . Prostate cancer Father   . Cancer Maternal Aunt   . Cancer Maternal Uncle   . Cancer Maternal Grandmother   . Cancer Maternal Grandfather   . Cancer Paternal Grandmother   . Cancer Paternal Uncle        prostate cancer  . Colon cancer Neg Hx   . Rectal cancer Neg Hx   . Stomach cancer Neg Hx     Social History   Socioeconomic History  . Marital status: Single    Spouse name: Not on file  . Number of children: Not on file  . Years of education: Not on file   . Highest education level: Not on file  Occupational History  . Not on file  Tobacco Use  . Smoking status: Never Smoker  . Smokeless tobacco: Never Used  Substance and Sexual Activity  . Alcohol use: No  . Drug use: No  . Sexual activity: Not Currently  Other Topics Concern  . Not on file  Social History Narrative  . Not on file   Social Determinants of Health   Financial Resource Strain:   . Difficulty of Paying Living Expenses: Not on file  Food Insecurity:   . Worried About Charity fundraiser in the Last Year: Not on file  . Ran Out of Food in the Last Year: Not on file  Transportation Needs:   .  Lack of Transportation (Medical): Not on file  . Lack of Transportation (Non-Medical): Not on file  Physical Activity:   . Days of Exercise per Week: Not on file  . Minutes of Exercise per Session: Not on file  Stress:   . Feeling of Stress : Not on file  Social Connections:   . Frequency of Communication with Friends and Family: Not on file  . Frequency of Social Gatherings with Friends and Family: Not on file  . Attends Religious Services: Not on file  . Active Member of Clubs or Organizations: Not on file  . Attends Archivist Meetings: Not on file  . Marital Status: Not on file  Intimate Partner Violence:   . Fear of Current or Ex-Partner: Not on file  . Emotionally Abused: Not on file  . Physically Abused: Not on file  . Sexually Abused: Not on file    Past Medical History, Surgical history, Social history, and Family history were reviewed and updated as appropriate.   Please see review of systems for further details on the patient's review from today.   Objective:   Physical Exam:  There were no vitals taken for this visit.  Physical Exam Constitutional:      General: He is not in acute distress.    Appearance: He is well-developed.  Musculoskeletal:        General: No deformity.  Neurological:     Mental Status: He is alert and oriented to  person, place, and time.     Coordination: Coordination normal.  Psychiatric:        Attention and Perception: Attention normal. He is attentive.        Mood and Affect: Mood is anxious. Mood is not depressed. Affect is blunt. Affect is not labile, angry or inappropriate.        Speech: Speech is not rapid and pressured, delayed or slurred.        Behavior: Behavior normal. Behavior is not agitated.        Thought Content: Thought content is paranoid. Thought content does not include homicidal or suicidal ideation. Thought content does not include homicidal or suicidal plan.        Cognition and Memory: Cognition normal.        Judgment: Judgment is inappropriate.     Comments: Insight is fair. Chronic obs/paranoia about passing gas at work is borderline delusional. [employer] spending collectibles. Chronic hypoverbal     Lab Review:     Component Value Date/Time   NA 139 09/11/2018 1120   K 4.9 09/11/2018 1120   CL 101 09/11/2018 1120   CO2 25 09/11/2018 1120   GLUCOSE 74 09/11/2018 1120   GLUCOSE 89 07/30/2013 0913   BUN 7 09/11/2018 1120   CREATININE 1.06 09/11/2018 1120   CREATININE 0.91 07/30/2013 0913   CALCIUM 9.8 09/11/2018 1120   PROT 7.2 09/11/2018 1120   ALBUMIN 4.6 09/11/2018 1120   AST 27 09/11/2018 1120   ALT 37 09/11/2018 1120   ALKPHOS 75 09/11/2018 1120   BILITOT 0.3 09/11/2018 1120   GFRNONAA 78 09/11/2018 1120   GFRAA 90 09/11/2018 1120       Component Value Date/Time   WBC 8.0 09/11/2018 1120   WBC 7.7 05/25/2016 0739   RBC 4.72 09/11/2018 1120   RBC 4.72 05/25/2016 0739   HGB 14.8 09/11/2018 1120   HCT 43.3 09/11/2018 1120   PLT 363 09/11/2018 1120   MCV 92 09/11/2018 1120   MCH 31.4 09/11/2018  1120   MCH 31.1 05/25/2016 0739   MCHC 34.2 09/11/2018 1120   MCHC 34.2 05/25/2016 0739   RDW 13.0 09/11/2018 1120   LYMPHSABS 2.1 09/11/2018 1120   MONOABS 693 05/25/2016 0739   EOSABS 0.3 09/11/2018 1120   BASOSABS 0.1 09/11/2018 1120     Lithium Lvl  Date Value Ref Range Status  04/18/2018 <0.3 (L) 0.6 - 1.2 mmol/L Final    Comment:    Verified by repeat analysis. .      No results found for: PHENYTOIN, PHENOBARB, VALPROATE, CBMZ   .res Assessment: Plan:    Tellis was seen today for follow-up, depression and other.  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  Specifically he is chronically  suspicious that he has dementia despite neuropsychological testing which tells him he does not.  He also has chronic thoughts that other people find his odor offensive because of passing gas.  There is very little evidence to support this.  He has been under my psychiatric care for many many years and never owed noticed body odor and appointments at any time.  He also suspects that people are thinking negatively of him regularly.  Overall his paranoia and anxiety and depression seem improved since the increase in olanzapine.  We will therefore continue his current dosages.  Discussed the risk of serotonin syndrome with the writer than usual dose of fluvoxamine but again this appears to have been helpful for his anxiety and obsessive thinking.  Discussed potential metabolic side effects associated with atypical antipsychotics, as well as potential risk for movement side effects. Advised pt to contact office if movement side effects occur.  No evidence of movement disorder. Still obsessing on farting and thinking others notice but it's never been noticeable in years of seeing him and he never gets complaints from other about it.  Discussed the risk of polypharmacy but again if appears medically  necessary, helpful and well tolerated.  Lithium level on ultra low dose was undetectable with BP meds.  Failed  Trial off lablel naltrexone 50 mg HS for impulse buying that does not appear manic.  DC naltrexone.  No other med changes.  FU 4 mos.  Lynder Parents, MD, DFAPA  Please see After Visit Summary for patient specific instructions.  Future Appointments  Date Time Provider [place]  01/16/2019  3:00 PM Tysinger, Camelia Eng, PA-C [facility]    No orders of the defined types were placed in this encounter.     -------------------------------

## 2019-01-16 NOTE — Addendum Note (Signed)
Addended by: Edgar Frisk on: 01/16/2019 04:40 PM   Modules accepted: Orders

## 2019-01-26 ENCOUNTER — Other Ambulatory Visit: Payer: Self-pay | Admitting: Psychiatry

## 2019-02-07 ENCOUNTER — Other Ambulatory Visit: Payer: Self-pay | Admitting: Family Medicine

## 2019-02-11 ENCOUNTER — Other Ambulatory Visit: Payer: Self-pay | Admitting: Psychiatry

## 2019-03-25 ENCOUNTER — Telehealth: Payer: Self-pay | Admitting: Family Medicine

## 2019-03-25 DIAGNOSIS — Z125 Encounter for screening for malignant neoplasm of prostate: Secondary | ICD-10-CM

## 2019-03-25 NOTE — Telephone Encounter (Signed)
I placed the order.

## 2019-03-25 NOTE — Telephone Encounter (Signed)
Pt called and is wanting to come in and have his PSA checked he would like to come in tomorrow morning pt can be reached at [phone]

## 2019-03-26 ENCOUNTER — Other Ambulatory Visit: Payer: 59

## 2019-03-26 NOTE — Telephone Encounter (Signed)
LVM for pt to call and schedule a nurse visit for his PSA. [place]

## 2019-03-30 ENCOUNTER — Other Ambulatory Visit: Payer: Self-pay

## 2019-03-30 ENCOUNTER — Other Ambulatory Visit: Payer: 59

## 2019-03-30 DIAGNOSIS — Z125 Encounter for screening for malignant neoplasm of prostate: Secondary | ICD-10-CM

## 2019-03-31 LAB — PSA: Prostate Specific Ag, Serum: 0.6 ng/mL (ref 0.0–4.0)

## 2019-04-24 ENCOUNTER — Encounter: Payer: Self-pay | Admitting: Gastroenterology

## 2019-04-24 ENCOUNTER — Ambulatory Visit: Payer: 59 | Admitting: Gastroenterology

## 2019-04-24 ENCOUNTER — Other Ambulatory Visit (INDEPENDENT_AMBULATORY_CARE_PROVIDER_SITE_OTHER): Payer: 59

## 2019-04-24 VITALS — BP 104/74 | HR 90 | Temp 97.6°F | Ht 75.0 in | Wt 257.0 lb

## 2019-04-24 DIAGNOSIS — R197 Diarrhea, unspecified: Secondary | ICD-10-CM | POA: Diagnosis not present

## 2019-04-24 DIAGNOSIS — R143 Flatulence: Secondary | ICD-10-CM

## 2019-04-24 LAB — CBC WITH DIFFERENTIAL/PLATELET
Basophils Absolute: 0.1 10*3/uL (ref 0.0–0.1)
Basophils Relative: 0.6 % (ref 0.0–3.0)
Eosinophils Absolute: 0.3 10*3/uL (ref 0.0–0.7)
Eosinophils Relative: 3.5 % (ref 0.0–5.0)
HCT: 42.4 % (ref 39.0–52.0)
Hemoglobin: 14.6 g/dL (ref 13.0–17.0)
Lymphocytes Relative: 19.4 % (ref 12.0–46.0)
Lymphs Abs: 1.9 10*3/uL (ref 0.7–4.0)
MCHC: 34.4 g/dL (ref 30.0–36.0)
MCV: 93 fl (ref 78.0–100.0)
Monocytes Absolute: 1 10*3/uL (ref 0.1–1.0)
Monocytes Relative: 10.3 % (ref 3.0–12.0)
Neutro Abs: 6.5 10*3/uL (ref 1.4–7.7)
Neutrophils Relative %: 66.2 % (ref 43.0–77.0)
Platelets: 360 10*3/uL (ref 150.0–400.0)
RBC: 4.56 Mil/uL (ref 4.22–5.81)
RDW: 13.1 % (ref 11.5–15.5)
WBC: 9.8 10*3/uL (ref 4.0–10.5)

## 2019-04-24 LAB — COMPREHENSIVE METABOLIC PANEL
ALT: 32 U/L (ref 0–53)
AST: 25 U/L (ref 0–37)
Albumin: 4.5 g/dL (ref 3.5–5.2)
Alkaline Phosphatase: 71 U/L (ref 39–117)
BUN: 9 mg/dL (ref 6–23)
CO2: 30 mEq/L (ref 19–32)
Calcium: 9.2 mg/dL (ref 8.4–10.5)
Chloride: 93 mEq/L — ABNORMAL LOW (ref 96–112)
Creatinine, Ser: 1.19 mg/dL (ref 0.40–1.50)
GFR: 62.95 mL/min (ref >60.00)
Glucose, Bld: 93 mg/dL (ref 70–99)
Potassium: 4.7 mEq/L (ref 3.5–5.1)
Sodium: 129 mEq/L — ABNORMAL LOW (ref 135–145)
Total Bilirubin: 0.4 mg/dL (ref 0.2–1.2)
Total Protein: 7.6 g/dL (ref 6.0–8.3)

## 2019-04-24 LAB — SEDIMENTATION RATE: Sed Rate: 19 mm/hr (ref 0–20)

## 2019-04-24 LAB — C-REACTIVE PROTEIN: CRP: <1 mg/dL (ref 0.5–20.0)

## 2019-04-24 LAB — LIPASE: Lipase: 46 U/L (ref 11.0–59.0)

## 2019-04-24 LAB — IGA: IgA: 210 mg/dL (ref 68–378)

## 2019-04-24 NOTE — Patient Instructions (Signed)
Your provider has requested that you go to the basement level for lab work before leaving today. Press "B" on the elevator. The lab is located at the first door on the left as you exit the elevator.  Start over the counter Gas-x three times a day as needed.   Thank you for choosing me and [facility].  Pricilla Riffle. Dagoberto Ligas., MD., Marval Regal

## 2019-04-24 NOTE — Progress Notes (Signed)
History of Present Illness: This is a 59 year old male referred by Denita Lung, MD for the evaluation of diarrhea, gas, flatulence.  He was previously followed by Dr. Verl Blalock. I assumed his care in 2016.  He has a history of Crohn's ileocolitis.  Last colonoscopy performed in April 2016 showed small ulcers at the ileocecal valve and terminal ileal erosions.  Biopsies were felt consistent with Crohn's disease however granulomas were not seen.  He has not returned for regular follow-up.  He relates difficulties with frequent diarrhea and occasional constipation.  He states Metamucil has regularized his bowel movements.  He notes significant intestinal gas and flatulence.  He has rare episodes of heartburn.  No other gastrointestinal complaints. Denies weight loss, abdominal pain, change in stool caliber, melena, hematochezia, nausea, vomiting, dysphagia, chest pain.    Allergies  Allergen Reactions  . Erythromycin     Pt unsure, reports he recently received a -mycin drug with no reaction.   Marland Kitchen Penicillins     REACTION: "it may kill me"  . Tetracyclines & Related Hives   Outpatient Medications Prior to Visit  Medication Sig Dispense Refill  . ALPRAZolam (XANAX) 0.5 MG tablet Take 1 tablet (0.5 mg total) by mouth daily as needed. 30 tablet 2  . busPIRone (BUSPAR) 30 MG tablet TAKE 1 TABLET(30 MG) BY MOUTH TWICE DAILY 60 tablet 5  . donepezil (ARICEPT) 10 MG tablet TAKE 1 TABLET(10 MG) BY MOUTH AT BEDTIME 90 tablet 1  . fluvoxaMINE (LUVOX) 100 MG tablet TAKE 1 TABLET BY MOUTH EVERY MORNING THEN TAKE 3 TABLETS BY MOUTH EVERY EVENING 360 tablet 1  . lamoTRIgine (LAMICTAL) 200 MG tablet TAKE 1 TABLET BY MOUTH EVERY DAY 90 tablet 1  . levothyroxine (SYNTHROID) 75 MCG tablet Take 1 tablet (75 mcg total) by mouth daily. 90 tablet 3  . lithium carbonate 150 MG capsule TAKE 1 CAPSULE(150 MG) BY MOUTH AT BEDTIME 90 capsule 1  . losartan-hydrochlorothiazide (HYZAAR) 100-12.5 MG tablet Take 1  tablet by mouth daily. 90 tablet 3  . Multiple Vitamin (MULTIVITAMIN WITH MINERALS) TABS Take 1 tablet by mouth daily.    Marland Kitchen OLANZapine (ZYPREXA) 20 MG tablet TAKE 1 TABLET BY MOUTH EVERY NIGHT AT BEDTIME 90 tablet 1   No facility-administered medications prior to visit.   Past Medical History:  Diagnosis Date  . Allergy   . Anxiety   . Crohn disease ([place])    pt reports subsequent testing was normal  . Depression   . Herpes simplex   . IBS (irritable bowel syndrome)   . Incontinence    Past Surgical History:  Procedure Laterality Date  . APPENDECTOMY    . COLONOSCOPY    . POLYPECTOMY    . TWISTED TESTICLES  1970   [place]   . UMBILICAL HERNIA REPAIR     Social History   Socioeconomic History  . Marital status: Single    Spouse name: Not on file  . Number of children: Not on file  . Years of education: Not on file  . Highest education level: Not on file  Occupational History  . Not on file  Tobacco Use  . Smoking status: Never Smoker  . Smokeless tobacco: Never Used  Substance and Sexual Activity  . Alcohol use: No  . Drug use: No  . Sexual activity: Not Currently  Other Topics Concern  . Not on file  Social History Narrative  . Not on file   Social Determinants of Health  Financial Resource Strain:   . Difficulty of Paying Living Expenses:   Food Insecurity:   . Worried About Charity fundraiser in the Last Year:   . Arboriculturist in the Last Year:   Transportation Needs:   . Film/video editor (Medical):   Marland Kitchen Lack of Transportation (Non-Medical):   Physical Activity:   . Days of Exercise per Week:   . Minutes of Exercise per Session:   Stress:   . Feeling of Stress :   Social Connections:   . Frequency of Communication with Friends and Family:   . Frequency of Social Gatherings with Friends and Family:   . Attends Religious Services:   . Active Member of Clubs or Organizations:   . Attends Archivist Meetings:   Marland Kitchen Marital Status:     Family History  Problem Relation Age of Onset  . Arthritis Mother   . Stroke Mother   . Depression Mother   . Hypertension Father   . Prostate cancer Father 63  . Cancer Maternal Aunt   . Prostate cancer Maternal Uncle   . Cancer Maternal Grandmother   . Cancer Maternal Grandfather   . Cancer Paternal Grandmother   . Prostate cancer Paternal Uncle   . Colon cancer Neg Hx   . Rectal cancer Neg Hx   . Stomach cancer Neg Hx   . Esophageal cancer Neg Hx   . Pancreatic cancer Neg Hx        Review of Systems: Pertinent positive and negative review of systems were noted in the above HPI section. All other review of systems were otherwise negative.    Physical Exam: General: Well developed, well nourished, no acute distress Head: Normocephalic and atraumatic Eyes:  sclerae anicteric, EOMI Ears: Normal auditory acuity Mouth: Not examined, mask on during Covid-19 pandemic Neck: Supple, no masses or thyromegaly Lungs: Clear throughout to auscultation Heart: Regular rate and rhythm; no murmurs, rubs or bruits Abdomen: Soft, non tender and non distended. No masses, hepatosplenomegaly or hernias noted. Normal Bowel sounds Rectal: Not done Musculoskeletal: Symmetrical with no gross deformities  Skin: No lesions on visible extremities Pulses:  Normal pulses noted Extremities: No clubbing, cyanosis, edema or deformities noted Neurological: Alert oriented x 4, grossly nonfocal Cervical Nodes:  No significant cervical adenopathy Inguinal Nodes: No significant inguinal adenopathy Psychological:  Alert and cooperative. Normal mood and affect   Assessment and Recommendations:  1. Gas, flatulence, diarrhea occasional constipation.  Suspected Crohn's ileocolitis.  Begin evaluation with IgA, tTG, GI pathogen panel, fecal elastase, CBC, CMP, lipase, ESR, CRP.  Consider CT AP and repeat colonoscopy for further evaluation.  Consider empiric treatment for suspected Crohn's with budesonide.  REV in about 6 weeks.    cc: Denita Lung, [address] [place],  [place] [address]

## 2019-04-27 LAB — TISSUE TRANSGLUTAMINASE, IGA: (tTG) Ab, IgA: 1 U/mL

## 2019-05-18 ENCOUNTER — Other Ambulatory Visit: Payer: 59

## 2019-05-18 DIAGNOSIS — R197 Diarrhea, unspecified: Secondary | ICD-10-CM

## 2019-05-18 DIAGNOSIS — R143 Flatulence: Secondary | ICD-10-CM

## 2019-05-19 LAB — GI PROFILE, STOOL, PCR

## 2019-05-23 LAB — PANCREATIC ELASTASE, FECAL: Pancreatic Elastase-1, Stool: 94 mcg/g — ABNORMAL LOW

## 2019-05-25 ENCOUNTER — Other Ambulatory Visit: Payer: Self-pay

## 2019-05-25 MED ORDER — PANCRELIPASE (LIP-PROT-AMYL) 36000-114000 UNITS PO CPEP: ORAL_CAPSULE | ORAL | 6 refills | Status: DC

## 2019-05-29 ENCOUNTER — Telehealth: Payer: Self-pay | Admitting: Gastroenterology

## 2019-05-29 NOTE — Telephone Encounter (Signed)
Patient called wanting to know when should he be seeing results with the new medication and if he can have them stored in a hot car

## 2019-05-29 NOTE — Telephone Encounter (Signed)
Patient advised should not store his medications in the hot car.  He should see improvement in a few weeks.  If not let us know.  New start Creon

## 2019-06-05 ENCOUNTER — Telehealth: Payer: Self-pay | Admitting: Gastroenterology

## 2019-06-05 NOTE — Telephone Encounter (Signed)
Should take his creon 2 with meals and one with a snack.   I left a detailed message and asked to call back with questions.

## 2019-06-18 ENCOUNTER — Ambulatory Visit: Payer: 59 | Admitting: Gastroenterology

## 2019-06-18 ENCOUNTER — Encounter: Payer: Self-pay | Admitting: Gastroenterology

## 2019-06-18 VITALS — BP 130/80 | HR 80 | Ht 75.0 in | Wt 254.6 lb

## 2019-06-18 DIAGNOSIS — K8689 Other specified diseases of pancreas: Secondary | ICD-10-CM | POA: Diagnosis not present

## 2019-06-18 NOTE — Patient Instructions (Signed)
You can use over the counter Gas-x four times a day for gas and bloating.   You have been given a gas prevention diet.   Thank you for choosing me and [facility].  Pricilla Riffle. Dagoberto Ligas., MD., Marval Regal

## 2019-06-18 NOTE — Progress Notes (Signed)
    History of Present Illness: This is a 59 year old male with a history of Crohn's ileocolitis who presented with worsening diarrhea.  Evaluation was remarkable for a low fecal elastase. A fat modified diet and Creon were started in late May.  He relates that his diarrhea has resolved however he complains of gas and flatulence.  Current Medications, Allergies, Past Medical History, Past Surgical History, Family History and Social History were reviewed in [employer] record.   Physical Exam: General: Well developed, well nourished, no acute distress Head: Normocephalic and atraumatic Eyes:  sclerae anicteric, EOMI Ears: Normal auditory acuity Mouth: Not examined, mask on during Covid-19 pandemic Lungs: Clear throughout to auscultation Heart: Regular rate and rhythm; no murmurs, rubs or bruits Abdomen: Soft, non tender and non distended. No masses, hepatosplenomegaly or hernias noted. Normal Bowel sounds Rectal: Not done Musculoskeletal: Symmetrical with no gross deformities  Pulses:  Normal pulses noted Extremities: No clubbing, cyanosis, edema or deformities noted Neurological: Alert oriented x 4, grossly nonfocal Psychological:  Alert and cooperative. Normal mood and affect   Assessment and Recommendations:  1.  Pancreatic insufficiency.  Continue Creon 36,000 units 2 before meals and 1 before snacks.  Follow a fat modified diet.  Patient given instructions on a low gas diet.  Use Gas-X or equivalent 3-4 times daily. REV in 6 months.   2.  Crohn's ileocolitis.  Appears to be asymptomatic.  Advised colonoscopy to further evaluate and he declines at this time.  Will reconsider at his REV.

## 2019-07-02 ENCOUNTER — Other Ambulatory Visit: Payer: Self-pay | Admitting: Psychiatry

## 2019-07-16 ENCOUNTER — Ambulatory Visit: Payer: 59 | Admitting: Psychiatry

## 2019-07-21 ENCOUNTER — Other Ambulatory Visit: Payer: Self-pay | Admitting: Psychiatry

## 2019-07-23 ENCOUNTER — Encounter: Payer: Self-pay | Admitting: Psychiatry

## 2019-07-23 ENCOUNTER — Other Ambulatory Visit: Payer: Self-pay

## 2019-07-23 ENCOUNTER — Ambulatory Visit (INDEPENDENT_AMBULATORY_CARE_PROVIDER_SITE_OTHER): Payer: 59 | Admitting: Psychiatry

## 2019-07-23 DIAGNOSIS — F4010 Social phobia, unspecified: Secondary | ICD-10-CM

## 2019-07-23 DIAGNOSIS — F423 Hoarding disorder: Secondary | ICD-10-CM

## 2019-07-23 DIAGNOSIS — G3184 Mild cognitive impairment, so stated: Secondary | ICD-10-CM

## 2019-07-23 DIAGNOSIS — F3341 Major depressive disorder, recurrent, in partial remission: Secondary | ICD-10-CM | POA: Diagnosis not present

## 2019-07-23 DIAGNOSIS — F422 Mixed obsessional thoughts and acts: Secondary | ICD-10-CM

## 2019-07-23 MED ORDER — OLANZAPINE 15 MG PO TABS: 15.0000 mg | ORAL_TABLET | Freq: Every day | ORAL | 0 refills | Status: DC

## 2019-07-23 MED ORDER — FLUVOXAMINE MALEATE 100 MG PO TABS: ORAL_TABLET | ORAL | 1 refills | Status: DC

## 2019-07-23 MED ORDER — LAMOTRIGINE 200 MG PO TABS: 200.0000 mg | ORAL_TABLET | Freq: Every day | ORAL | 1 refills | Status: DC

## 2019-07-23 MED ORDER — DONEPEZIL HCL 10 MG PO TABS: ORAL_TABLET | ORAL | 1 refills | Status: DC

## 2019-07-23 MED ORDER — LITHIUM CARBONATE 150 MG PO CAPS: 150.0000 mg | ORAL_CAPSULE | Freq: Every day | ORAL | 3 refills | Status: DC

## 2019-07-23 NOTE — Patient Instructions (Signed)
Stop naltrexone if still taking it.

## 2019-07-23 NOTE — Progress Notes (Signed)
Zachary [name] [id number] [birth year] 59 y.o.  Subjective:   Patient ID:  Zachary [name] is a 59 y.o. (DOB 1960/09/02) male.  Chief Complaint:  Chief Complaint  Patient presents with  . Follow-up  . word finding problems  . Anxiety  . Depression    Depression        Associated symptoms include no decreased concentration and no suicidal ideas.  Past medical history includes anxiety.   Anxiety Symptoms include nervous/anxious behavior. Patient reports no confusion, decreased concentration, dizziness or suicidal ideas.     Zachary [name] presents to the office today for follow-up of depression, obsessive anxiety and paranoia about body odor and getting dementia.  Did feel somewhat reassured about the fact that Dr. Marcos Eke said he does not have Alzheimer's dx.  visit October 16, 2018.  Initiated a trial of naltrexone off label 50 mg daily for impulse control purchasing.  01/16/2019 appointment with the following noted No benefit naltrexone.  Doesn't like his job but fears change in jobs bc "of the farting" and doesn't feel he'd be accepted in other places.  In this job for 16 years without a promotion.  There have bbeen layoffs and he hasn't gotten layoffs.   The same overall with mental health issues.  Nice new boss.   Worries over his thoughts of patient.  Mood stable.   Continued concerns over spending on things he doesn't need or even really want.  Books he'll never read or DVDs he'll never watch. Huge problem buying too much off the Internet.  Blew check from incentive on impulse purchase from the Internet.  Books and collectibles per usual.  Running up debt.  No other impulsivity.   Not hyper nor otherwise manic.    No history of bankruptcy.  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  Maybe out of boredom.  Needs to stop and can't control himself.Has not talked to anyone about it.  Doesn't get anxious around the spending.  Not depressed.  Busy at work.  Sleep fine.  Sometimes  anxious about passing gas at work and not otherwise anxious there.  Rarely used Xanax.   Depression has remained under control. Overall he thinks the depression and anxiety levels have been "fine".  Friends notice his comprehension problems. Not sure he trusts the neuropsychological testing which was normal and did not show dementia as he fears.  Plan: No meds changed.  07/23/2019 appointment with the following noted: Concerns over concentration and memory.  Thinks he's having trouble with word-finding.  Bosses satisfied with work response and function.  Patient reports stable mood and denies depressed or irritable moods.  Patient denies any recent difficulty with anxiety or fear.  Patient denies difficulty with sleep initiation or maintenance over 8 hours. Denies appetite disturbance.  Patient reports that energy and motivation have been good.  Patient denies any difficulty with concentration.  Patient denies any suicidal ideation.  Good interest and enjoyment.  F has unknown cancer in chemo in early 11's.   Talk every other day.  Past psych med: Abilify, Zyprexa, Seroquel 600, W  Wellbutrin, nortriptyline, clomipramine, mirtazapine,  Lamotrigine, lithium no response, stimulants, buspirone 60, fluvoxamine, Aricept  Naltrexone for compulsive buying NR  Review of Systems:  Review of Systems  Neurological: Negative for dizziness, tremors and weakness.  Psychiatric/Behavioral: Positive for depression. Negative for agitation, behavioral problems, confusion, decreased concentration, dysphoric mood, hallucinations, self-injury, sleep disturbance and suicidal ideas. The patient is nervous/anxious. The patient is not hyperactive.  Medications: I have reviewed the patient's current medications.  Current Outpatient Medications  Medication Sig Dispense Refill  . ALPRAZolam (XANAX) 0.5 MG tablet Take 1 tablet (0.5 mg total) by mouth daily as needed. 30 tablet 2  . busPIRone (BUSPAR) 30 MG tablet  TAKE 1 TABLET(30 MG) BY MOUTH TWICE DAILY 60 tablet 5  . donepezil (ARICEPT) 10 MG tablet TAKE 1 TABLET(10 MG) BY MOUTH AT BEDTIME 90 tablet 1  . fluvoxaMINE (LUVOX) 100 MG tablet TAKE 1 TABLET BY MOUTH EVERY MORNING THEN TAKE 3 TABLETS BY MOUTH EVERY EVENING 360 tablet 1  . lamoTRIgine (LAMICTAL) 200 MG tablet Take 1 tablet (200 mg total) by mouth daily. 90 tablet 1  . levothyroxine (SYNTHROID) 75 MCG tablet Take 1 tablet (75 mcg total) by mouth daily. 90 tablet 3  . lipase/protease/amylase (CREON) 36000 UNITS CPEP capsule Take 2 by mouth before each meal and one with each snack 420 capsule 6  . lithium carbonate 150 MG capsule Take 1 capsule (150 mg total) by mouth daily. 90 capsule 3  . losartan-hydrochlorothiazide (HYZAAR) 100-12.5 MG tablet Take 1 tablet by mouth daily. 90 tablet 3  . Multiple Vitamin (MULTIVITAMIN WITH MINERALS) TABS Take 1 tablet by mouth daily.    Marland Kitchen OLANZapine (ZYPREXA) 15 MG tablet Take 1 tablet (15 mg total) by mouth at bedtime. 90 tablet 0   No current facility-administered medications for this visit.    Medication Side Effects: None  Allergies:  Allergies  Allergen Reactions  . Erythromycin     Pt unsure, reports he recently received a -mycin drug with no reaction.   Marland Kitchen Penicillins     REACTION: "it may kill me"  . Tetracyclines & Related Hives    Past Medical History:  Diagnosis Date  . Allergy   . Anxiety   . Crohn disease ([place])    pt reports subsequent testing was normal  . Depression   . Herpes simplex   . IBS (irritable bowel syndrome)   . Incontinence     Family History  Problem Relation Age of Onset  . Arthritis Mother   . Stroke Mother   . Depression Mother   . Hypertension Father   . Prostate cancer Father 64  . Cancer Maternal Aunt   . Prostate cancer Maternal Uncle   . Breast cancer Maternal Grandmother   . Cancer Maternal Grandfather   . Cancer Paternal Grandmother   . Prostate cancer Paternal Uncle   . Colon cancer Neg Hx    . Rectal cancer Neg Hx   . Stomach cancer Neg Hx   . Esophageal cancer Neg Hx   . Pancreatic cancer Neg Hx     Social History   Socioeconomic History  . Marital status: Single    Spouse name: Not on file  . Number of children: Not on file  . Years of education: Not on file  . Highest education level: Not on file  Occupational History  . Not on file  Tobacco Use  . Smoking status: Former Smoker    Quit date: 1980    Years since quitting: 41.5  . Smokeless tobacco: Never Used  Vaping Use  . Vaping Use: Never used  Substance and Sexual Activity  . Alcohol use: No  . Drug use: No  . Sexual activity: Not Currently  Other Topics Concern  . Not on file  Social History Narrative  . Not on file   Social Determinants of Health   Financial Resource Strain:   .  Difficulty of Paying Living Expenses:   Food Insecurity:   . Worried About Charity fundraiser in the Last Year:   . Arboriculturist in the Last Year:   Transportation Needs:   . Film/video editor (Medical):   Marland Kitchen Lack of Transportation (Non-Medical):   Physical Activity:   . Days of Exercise per Week:   . Minutes of Exercise per Session:   Stress:   . Feeling of Stress :   Social Connections:   . Frequency of Communication with Friends and Family:   . Frequency of Social Gatherings with Friends and Family:   . Attends Religious Services:   . Active Member of Clubs or Organizations:   . Attends Archivist Meetings:   Marland Kitchen Marital Status:   Intimate Partner Violence:   . Fear of Current or Ex-Partner:   . Emotionally Abused:   Marland Kitchen Physically Abused:   . Sexually Abused:     Past Medical History, Surgical history, Social history, and Family history were reviewed and updated as appropriate.   Please see review of systems for further details on the patient's review from today.   Objective:   Physical Exam:  There were no vitals taken for this visit.  Physical Exam Constitutional:      General:  He is not in acute distress.    Appearance: He is well-developed.  Musculoskeletal:        General: No deformity.  Neurological:     Mental Status: He is alert and oriented to person, place, and time.     Coordination: Coordination normal.  Psychiatric:        Attention and Perception: Attention normal. He is attentive.        Mood and Affect: Mood is anxious. Mood is not depressed. Affect is blunt. Affect is not labile, angry or inappropriate.        Speech: Speech is not rapid and pressured, delayed or slurred.        Behavior: Behavior normal. Behavior is not agitated.        Thought Content: Thought content is paranoid. Thought content does not include homicidal or suicidal ideation. Thought content does not include homicidal or suicidal plan.        Cognition and Memory: Cognition normal.        Judgment: Judgment is inappropriate.     Comments: Insight is fair. Chronic obs/paranoia about passing gas at work is borderline delusional. Compuslive spending collectibles. Chronic hypoverbal     Lab Review:     Component Value Date/Time   NA 129 (L) 04/24/2019 1053   NA 139 09/11/2018 1120   K 4.7 04/24/2019 1053   CL 93 (L) 04/24/2019 1053   CO2 30 04/24/2019 1053   GLUCOSE 93 04/24/2019 1053   BUN 9 04/24/2019 1053   BUN 7 09/11/2018 1120   CREATININE 1.19 04/24/2019 1053   CREATININE 0.91 07/30/2013 0913   CALCIUM 9.2 04/24/2019 1053   PROT 7.6 04/24/2019 1053   PROT 7.2 09/11/2018 1120   ALBUMIN 4.5 04/24/2019 1053   ALBUMIN 4.6 09/11/2018 1120   AST 25 04/24/2019 1053   ALT 32 04/24/2019 1053   ALKPHOS 71 04/24/2019 1053   BILITOT 0.4 04/24/2019 1053   BILITOT 0.3 09/11/2018 1120   GFRNONAA 78 09/11/2018 1120   GFRAA 90 09/11/2018 1120       Component Value Date/Time   WBC 9.8 04/24/2019 1053   RBC 4.56 04/24/2019 1053   HGB 14.6 04/24/2019  1053   HGB 14.8 09/11/2018 1120   HCT 42.4 04/24/2019 1053   HCT 43.3 09/11/2018 1120   PLT 360.0 04/24/2019 1053    PLT 363 09/11/2018 1120   MCV 93.0 04/24/2019 1053   MCV 92 09/11/2018 1120   MCH 31.4 09/11/2018 1120   MCH 31.1 05/25/2016 0739   MCHC 34.4 04/24/2019 1053   RDW 13.1 04/24/2019 1053   RDW 13.0 09/11/2018 1120   LYMPHSABS 1.9 04/24/2019 1053   LYMPHSABS 2.1 09/11/2018 1120   MONOABS 1.0 04/24/2019 1053   EOSABS 0.3 04/24/2019 1053   EOSABS 0.3 09/11/2018 1120   BASOSABS 0.1 04/24/2019 1053   BASOSABS 0.1 09/11/2018 1120    Lithium Lvl  Date Value Ref Range Status  04/18/2018 <0.3 (L) 0.6 - 1.2 mmol/L Final    Comment:    Verified by repeat analysis. .      No results found for: PHENYTOIN, PHENOBARB, VALPROATE, CBMZ   .res Assessment: Plan:    Zachary [name] was seen today for follow-up, word finding problems, anxiety and depression.  Diagnoses and all orders for this visit:  Recurrent major depression in partial remission ([place]) -     B12 and Folate Panel -     OLANZapine (ZYPREXA) 15 MG tablet; Take 1 tablet (15 mg total) by mouth at bedtime. -     lamoTRIgine (LAMICTAL) 200 MG tablet; Take 1 tablet (200 mg total) by mouth daily.  Hoarding disorder -     fluvoxaMINE (LUVOX) 100 MG tablet; TAKE 1 TABLET BY MOUTH EVERY MORNING THEN TAKE 3 TABLETS BY MOUTH EVERY EVENING  Mixed obsessional thoughts and acts -     OLANZapine (ZYPREXA) 15 MG tablet; Take 1 tablet (15 mg total) by mouth at bedtime. -     fluvoxaMINE (LUVOX) 100 MG tablet; TAKE 1 TABLET BY MOUTH EVERY MORNING THEN TAKE 3 TABLETS BY MOUTH EVERY EVENING  Social anxiety disorder -     OLANZapine (ZYPREXA) 15 MG tablet; Take 1 tablet (15 mg total) by mouth at bedtime. -     fluvoxaMINE (LUVOX) 100 MG tablet; TAKE 1 TABLET BY MOUTH EVERY MORNING THEN TAKE 3 TABLETS BY MOUTH EVERY EVENING  Mild cognitive impairment -     B12 and Folate Panel -     donepezil (ARICEPT) 10 MG tablet; TAKE 1 TABLET(10 MG) BY MOUTH AT BEDTIME -     lithium carbonate 150 MG capsule; Take 1 capsule (150 mg total) by mouth  daily.   Zachary [name] has been diagnosed with treatment resistant depression with psychotic features, OCD and social anxiety but schizoaffective may be more appropriate diagnosis given this chronic paranoid thinking.  Specifically he is chronically  suspicious that he has dementia despite neuropsychological testing which tells him he does not.  He also has chronic thoughts that other people find his odor offensive because of passing gas.  There is very little evidence to support this.  He has been under my psychiatric care for many many years and never owed noticed body odor and appointments at any time.  He also suspects that people are thinking negatively of him regularly.  Overall his paranoia and anxiety and depression seem improved since the increase in olanzapine.  We will therefore continue his current dosages.  Discussed the risk of serotonin syndrome with the writer than usual dose of fluvoxamine but again this appears to have been helpful for his anxiety and obsessive thinking.  Discussed potential metabolic side effects associated with atypical antipsychotics, as well as potential  risk for movement side effects. Advised pt to contact office if movement side effects occur.  No evidence of movement disorder. Still obsessing on farting and thinking others notice but it's never been noticeable in years of seeing him and he never gets complaints from other about it.  Discussed the risk of polypharmacy but again if appears medically necessary, helpful and well tolerated.  Check B12 and folated DT cognitive complaints.  Trial reduction in olanzapine to see if cognition is better to 15 mg daily.  FU 4 mos.  Lynder Parents, MD, DFAPA  Please see After Visit Summary for patient specific instructions.  No future appointments.  Orders Placed This Encounter  Procedures  . B12 and Folate Panel      -------------------------------

## 2019-08-04 ENCOUNTER — Other Ambulatory Visit: Payer: Self-pay | Admitting: Psychiatry

## 2019-08-08 ENCOUNTER — Other Ambulatory Visit: Payer: Self-pay | Admitting: Family Medicine

## 2019-08-08 DIAGNOSIS — I10 Essential (primary) hypertension: Secondary | ICD-10-CM

## 2019-08-10 NOTE — Telephone Encounter (Signed)
LVM for pt to call back to schedule a med check appointment . [place]

## 2019-08-14 ENCOUNTER — Telehealth: Payer: Self-pay | Admitting: Gastroenterology

## 2019-08-14 NOTE — Telephone Encounter (Signed)
Pt reports - loose bowels that comes and goes and gas, pt states that he feels like an increase in his Creon will help him or a different medication. Pt reports that he is taking Creon as directed - 2 capsules before meals and 1 before snacks. Pt states that he has not really been following the modified diet and tried Gas-X initially when he was first diagnosed but has not been consistent, did not feel like this was much help.  Advised that Dr. Fuller Plan is out of the office today and will send this message and get back to him once I receive a response.

## 2019-08-14 NOTE — Telephone Encounter (Signed)
Patient called would like to either increase Creon dosage or change medication please advise

## 2019-08-17 NOTE — Telephone Encounter (Signed)
Lm on vm for patient to return call 

## 2019-08-17 NOTE — Telephone Encounter (Signed)
Follow fat modified diet Avoid foods beverages that cause GI symptoms Increase Creon to 3 po ac and continue 1 po before snacks for 2 weeks and assess response Schedule REV with me or APP for further evaluation and mgmt

## 2019-08-18 NOTE — Telephone Encounter (Signed)
Lm on vm for patient to return call 

## 2019-08-20 NOTE — Telephone Encounter (Signed)
Lm on vm for patient to return call. Will mail letter for patient to call office.

## 2019-08-26 NOTE — Telephone Encounter (Addendum)
Patient returned call, advised of recommendations regarding fat modified diet, increasing Creon to 3 po before meals and continue 1 po before snacks for 2 weeks and see how he feels. Advised to schedule a follow up with Dr. Fuller Plan or APP for further evaluation and management. Pt requested that a letter be mailed with diet details, will mail low fat diet pamphlet.

## 2019-09-09 ENCOUNTER — Encounter: Payer: Self-pay | Admitting: Family Medicine

## 2019-09-09 ENCOUNTER — Telehealth: Payer: Self-pay | Admitting: Family Medicine

## 2019-09-09 ENCOUNTER — Other Ambulatory Visit: Payer: Self-pay

## 2019-09-09 DIAGNOSIS — I10 Essential (primary) hypertension: Secondary | ICD-10-CM

## 2019-09-09 MED ORDER — LOSARTAN POTASSIUM-HCTZ 100-12.5 MG PO TABS: 1.0000 | ORAL_TABLET | Freq: Every day | ORAL | 0 refills | Status: DC

## 2019-09-09 NOTE — Telephone Encounter (Signed)
Done and saw messages about appt. I also noted that pt needs appt on script. [place]

## 2019-09-09 NOTE — Telephone Encounter (Signed)
Called pt cell lmtrc, called work Surveyor, [employer].  Sent letter to pt.  Last cpe 07/16/18.  Pt needs med check or cpe

## 2019-09-09 NOTE — Telephone Encounter (Signed)
[facility] req Losartan HCTZ 100/12.5 mg tab  I have spoken with patient regarding needed appt and he will call back.

## 2019-09-14 ENCOUNTER — Telehealth: Payer: Self-pay | Admitting: Gastroenterology

## 2019-09-14 NOTE — Telephone Encounter (Signed)
Left message for patient to call back  

## 2019-09-15 ENCOUNTER — Telehealth: Payer: Self-pay | Admitting: Family Medicine

## 2019-09-15 ENCOUNTER — Encounter (INDEPENDENT_AMBULATORY_CARE_PROVIDER_SITE_OTHER): Payer: Self-pay

## 2019-09-15 NOTE — Telephone Encounter (Signed)
ok 

## 2019-09-15 NOTE — Telephone Encounter (Signed)
Pt had left me a message to sched him an early morning appt and leave details on his voice mail. Callled pt today advised 10/25 at 8:15 fasting.   Also sent him note in mail and email.

## 2019-09-16 NOTE — Telephone Encounter (Signed)
Patient is having occasional loose stools still. His stools have improved since starting on creon, but occasionally still has loose stool.  He is currently taking 3 creon with meals and 1 with a snack.  No pattern to the loose stools and not with any particular type of meal.

## 2019-09-16 NOTE — Telephone Encounter (Signed)
Maintain a fat modified diet and continue current Creon dosage. Add Imodium bid prn loose stools

## 2019-09-16 NOTE — Telephone Encounter (Signed)
Left message for patient to call back  

## 2019-09-17 NOTE — Telephone Encounter (Signed)
Left message for patient to call back  

## 2019-09-18 NOTE — Telephone Encounter (Signed)
I left a detailed message for the patient with recommendations from Dr. Fuller Plan

## 2019-09-24 ENCOUNTER — Other Ambulatory Visit: Payer: Self-pay | Admitting: Family Medicine

## 2019-10-08 ENCOUNTER — Other Ambulatory Visit: Payer: Self-pay | Admitting: Family Medicine

## 2019-10-08 DIAGNOSIS — I10 Essential (primary) hypertension: Secondary | ICD-10-CM

## 2019-10-09 ENCOUNTER — Telehealth: Payer: Self-pay | Admitting: Gastroenterology

## 2019-10-09 ENCOUNTER — Other Ambulatory Visit: Payer: Self-pay | Admitting: Family Medicine

## 2019-10-09 DIAGNOSIS — I10 Essential (primary) hypertension: Secondary | ICD-10-CM

## 2019-10-09 NOTE — Telephone Encounter (Signed)
Lm on vm for patient to return call 

## 2019-10-12 NOTE — Telephone Encounter (Signed)
Patient reports fecal incontinence.  He is asking what can be done.  I have sch him an appt to discuss on 11/03/19

## 2019-10-26 ENCOUNTER — Ambulatory Visit: Payer: 59 | Admitting: Family Medicine

## 2019-10-26 ENCOUNTER — Encounter: Payer: Self-pay | Admitting: Family Medicine

## 2019-10-26 ENCOUNTER — Other Ambulatory Visit: Payer: Self-pay

## 2019-10-26 VITALS — BP 116/78 | HR 76 | Temp 96.7°F | Wt 246.8 lb

## 2019-10-26 DIAGNOSIS — K50819 Crohn's disease of both small and large intestine with unspecified complications: Secondary | ICD-10-CM | POA: Diagnosis not present

## 2019-10-26 DIAGNOSIS — Z209 Contact with and (suspected) exposure to unspecified communicable disease: Secondary | ICD-10-CM

## 2019-10-26 DIAGNOSIS — Z125 Encounter for screening for malignant neoplasm of prostate: Secondary | ICD-10-CM | POA: Diagnosis not present

## 2019-10-26 DIAGNOSIS — J302 Other seasonal allergic rhinitis: Secondary | ICD-10-CM

## 2019-10-26 DIAGNOSIS — Z8042 Family history of malignant neoplasm of prostate: Secondary | ICD-10-CM

## 2019-10-26 DIAGNOSIS — N3281 Overactive bladder: Secondary | ICD-10-CM

## 2019-10-26 DIAGNOSIS — K8689 Other specified diseases of pancreas: Secondary | ICD-10-CM

## 2019-10-26 NOTE — Progress Notes (Signed)
   Subjective:    Patient ID: Zachary [name], male    DOB: [birth year], 59 y.o.   MRN: [id number]  HPI He is here for medication management visit.  He would like a follow-up PSA.  There is a family history of prostate cancer.  Previous numbers were in the 0.5 range.  He is also had some recent sexual activity, mainly oral but would like to be STD tested.  He has had no discharge, fever, chills, sore throat.  He continues to be followed by Dr. Clovis Pu and apparently a vitamin B12 level is requested.  He seems to be stable on his present medication regimen.  Does have a history of hypertension and continues on medication for that.  He also has a history of Crohn's disease as well as pancreatic insufficiency and sees Dr. Fuller Plan for this.  He has noted an increased incidence of stool incontinence over the last several months.  His allergies seem to be under good control.  He sees urology and is on Flomax.   Review of Systems     Objective:   Physical Exam Alert and in no distress. Tympanic membranes and canals are normal. Pharyngeal area is normal. Neck is supple without adenopathy or thyromegaly. Cardiac exam shows a regular sinus rhythm without murmurs or gallops. Lungs are clear to auscultation. His medications were reviewed.       Assessment & Plan:  Screening for prostate cancer - Plan: PSA  Family history of prostate cancer - Plan: PSA  Contact with or exposure to communicable disease - Plan: RPR+HIV+GC+CT Panel  Crohn's disease of small and large intestines with complication ([place]) - Plan: Vitamin B12  Seasonal allergies  OAB (overactive bladder)  Pancreatic insufficiency He will continue on his present medication regimen.  Discussed allergy treatment and since he is not having much trouble no intervention needed.  Encouraged him to call Dr. Fuller Plan concerning his stool incontinence.  Greater than 30 minutes spent in review of his medical record and evaluation.

## 2019-10-27 ENCOUNTER — Other Ambulatory Visit: Payer: Self-pay | Admitting: Psychiatry

## 2019-10-27 DIAGNOSIS — F3341 Major depressive disorder, recurrent, in partial remission: Secondary | ICD-10-CM

## 2019-10-27 DIAGNOSIS — F4010 Social phobia, unspecified: Secondary | ICD-10-CM

## 2019-10-27 DIAGNOSIS — F422 Mixed obsessional thoughts and acts: Secondary | ICD-10-CM

## 2019-10-27 LAB — RPR+HIV+GC+CT PANEL
Chlamydia trachomatis, NAA: NEGATIVE
HIV Screen 4th Generation wRfx: NONREACTIVE
Neisseria Gonorrhoeae by PCR: NEGATIVE
RPR Ser Ql: NONREACTIVE

## 2019-10-27 LAB — PSA: Prostate Specific Ag, Serum: 0.7 ng/mL (ref 0.0–4.0)

## 2019-10-27 LAB — VITAMIN B12: Vitamin B-12: 680 pg/mL (ref 232–1245)

## 2019-11-03 ENCOUNTER — Ambulatory Visit: Payer: 59 | Admitting: Gastroenterology

## 2019-11-03 ENCOUNTER — Encounter: Payer: Self-pay | Admitting: Gastroenterology

## 2019-11-03 DIAGNOSIS — Z8719 Personal history of other diseases of the digestive system: Secondary | ICD-10-CM | POA: Diagnosis not present

## 2019-11-03 DIAGNOSIS — R159 Full incontinence of feces: Secondary | ICD-10-CM | POA: Diagnosis not present

## 2019-11-03 DIAGNOSIS — K8689 Other specified diseases of pancreas: Secondary | ICD-10-CM

## 2019-11-03 DIAGNOSIS — R109 Unspecified abdominal pain: Secondary | ICD-10-CM | POA: Diagnosis not present

## 2019-11-03 MED ORDER — SUPREP BOWEL PREP KIT 17.5-3.13-1.6 GM/177ML PO SOLN: 1.0000 | ORAL | 0 refills | Status: DC

## 2019-11-03 MED ORDER — PANCRELIPASE (LIP-PROT-AMYL) 36000-114000 UNITS PO CPEP
ORAL_CAPSULE | ORAL | 3 refills | Status: DC
Start: 2019-11-03 — End: 2020-11-14

## 2019-11-03 NOTE — Patient Instructions (Addendum)
If you are age 57 or older, your body mass index should be between 23-30. Your Body mass index is 32.79 kg/m. If this is out of the aforementioned range listed, please consider follow up with your Primary Care Provider.  If you are age 31 or younger, your body mass index should be between 19-25. Your Body mass index is 32.79 kg/m. If this is out of the aformentioned range listed, please consider follow up with your Primary Care Provider.   You have been scheduled for a colonoscopy. Please follow written instructions given to you at your visit today.  Please pick up your prep supplies at the pharmacy within the next 1-3 days. If you use inhalers (even only as needed), please bring them with you on the day of your procedure.  Due to recent changes in healthcare laws, you may see the results of your imaging and laboratory studies on MyChart before your provider has had a chance to review them.  We understand that in some cases there may be results that are confusing or concerning to you. Not all laboratory results come back in the same time frame and the provider may be waiting for multiple results in order to interpret others.  Please give [place] 48 hours in order for your provider to thoroughly review all the results before contacting the office for clarification of your results.   INCREASE: Creon to 4 capsules with meals and 2 capsules with snacks  Thank you for entrusting me with your care and choosing [facility].  Alonza Bogus, PA-C

## 2019-11-03 NOTE — Progress Notes (Signed)
Reviewed and agree with management plan.  Khoa Opdahl T. Dung Prien, MD FACG [facility]  

## 2019-11-03 NOTE — Progress Notes (Signed)
11/03/2019 JAMICHAEL KNOTTS [id number] 29-Mar-1960   HISTORY OF PRESENT ILLNESS:  This is a 59 year old male who is a patient of Dr. Lynne Leader.  He apparently has remote history of Crohn's ileocolitis that he says was diagnosed in his teenage years.  He does not think he was ever really put on treatment for it.  At the time of his last colonoscopy in April 2016 he was found to have 2 ulcers at the IC valve and multiple erosions at the terminal ileum.  Biopsies showed focal active inflammation consistent with Crohn's disease.  He was also found to have EPI earlier this year when his pancreatic elastase stool study resulted at 34.  He has been taking 72,000 units of lipase with meals and 36,000 with snacks.  He says that it has seemed to help, but he still has a very soft and loose stools with episodes of incontinence.  He also complains of intermittent right-sided abdominal pain that he says comes about once a week or so.  He says that when it comes on it is very intense and lasts about an hour before resolving.  This occurs in both the right upper and lower quadrants.   Past Medical History:  Diagnosis Date  . Allergy   . Anxiety   . Crohn disease ([place])    pt reports subsequent testing was normal  . Depression   . Herpes simplex   . IBS (irritable bowel syndrome)   . Incontinence    Past Surgical History:  Procedure Laterality Date  . APPENDECTOMY    . COLONOSCOPY    . POLYPECTOMY    . TWISTED TESTICLES  1970   [place]   . UMBILICAL HERNIA REPAIR      reports that he quit smoking about 41 years ago. He has never used smokeless tobacco. He reports that he does not drink alcohol and does not use drugs. family history includes Arthritis in his mother; Breast cancer in his maternal grandmother; Cancer in his maternal aunt, maternal grandfather, and paternal grandmother; Depression in his mother; Hypertension in his father; Prostate cancer in his maternal uncle and paternal uncle;  Prostate cancer (age of onset: 57) in his father; Stroke in his mother. Allergies  Allergen Reactions  . Erythromycin     Pt unsure, reports he recently received a -mycin drug with no reaction.   Marland Kitchen Penicillins     REACTION: "it may kill me"  . Tetracyclines & Related Hives      Outpatient Encounter Medications as of 11/03/2019  Medication Sig  . ALPRAZolam (XANAX) 0.5 MG tablet Take 1 tablet (0.5 mg total) by mouth daily as needed.  Marland Kitchen aspirin 81 MG chewable tablet Asprin Ec Low Dose 81 mg tablet,delayed release  Take 1 tablet every day by oral route.  . busPIRone (BUSPAR) 30 MG tablet TAKE 1 TABLET(30 MG) BY MOUTH TWICE DAILY  . donepezil (ARICEPT) 10 MG tablet TAKE 1 TABLET(10 MG) BY MOUTH AT BEDTIME  . fluvoxaMINE (LUVOX) 100 MG tablet TAKE 1 TABLET BY MOUTH EVERY MORNING THEN TAKE 3 TABLETS BY MOUTH EVERY EVENING  . lamoTRIgine (LAMICTAL) 200 MG tablet Take 1 tablet (200 mg total) by mouth daily.  Marland Kitchen levothyroxine (SYNTHROID) 75 MCG tablet TAKE 1 TABLET(75 MCG) BY MOUTH DAILY  . lipase/protease/amylase (CREON) 36000 UNITS CPEP capsule Take 2 by mouth before each meal and one with each snack  . lithium carbonate 150 MG capsule Take 1 capsule (150 mg total) by mouth daily.  Marland Kitchen  losartan-hydrochlorothiazide (HYZAAR) 100-12.5 MG tablet TAKE 1 TABLET BY MOUTH DAILY  . Multiple Vitamin (MULTIVITAMIN WITH MINERALS) TABS Take 1 tablet by mouth daily.  Marland Kitchen OLANZapine (ZYPREXA) 15 MG tablet TAKE 1 TABLET(15 MG) BY MOUTH AT BEDTIME  . tamsulosin (FLOMAX) 0.4 MG CAPS capsule Take 0.8 mg by mouth daily.   No facility-administered encounter medications on file as of 11/03/2019.     REVIEW OF SYSTEMS  : All other systems reviewed and negative except where noted in the History of Present Illness.   PHYSICAL EXAM: BP (!) 100/58 (BP Location: Left Arm, Patient Position: Sitting, Cuff Size: Normal)   Pulse 80   Ht 6' 1"  (1.854 m) Comment: height measured without shoes  Wt 248 lb 8 oz (112.7 kg)    BMI 32.79 kg/m  General: Well developed white male in no acute distress Head: Normocephalic and atraumatic Eyes:  Sclerae anicteric, conjunctiva pink. Ears: Normal auditory acuity Lungs: Clear throughout to auscultation; no W/R/R Heart: Regular rate and rhythm; no M/R/G. Abdomen: Soft, non-distended.  BS present.  Mild right sided TTP. Rectal:  Will be done at the time of colonoscopy.   Musculoskeletal: Symmetrical with no gross deformities  Skin: No lesions on visible extremities Extremities: No edema  Neurological: Alert oriented x 4, grossly non-focal Psychological:  Alert and cooperative. Normal mood and affect  ASSESSMENT AND PLAN: *Intermittent right-sided abdominal pains and loose/mushy stools with incontinence: Has history of Crohn's ileocolitis.  Not on treatment.  Had some erosions and a small ulcer at the IC valve/TI and last colonoscopy.  Biopsies consistent with IBD.  We will plan for colonoscopy for reevaluation with Dr. Fuller Plan. *EPI: Still having very soft stools and sometimes loose stools as well.  Question if this is related to something else such as Crohn's ileocolitis versus EPI.  We will increase his Creon dosing to 4 pills with meals and 2 with snacks.  New prescription sent to the pharmacy.  **The risks, benefits, and alternatives to colonoscopy were discussed with the patient and he consents to proceed.    CC:  Denita Lung, MD

## 2019-11-04 ENCOUNTER — Other Ambulatory Visit: Payer: Self-pay | Admitting: Family Medicine

## 2019-11-04 DIAGNOSIS — I10 Essential (primary) hypertension: Secondary | ICD-10-CM

## 2019-11-25 ENCOUNTER — Encounter: Payer: Self-pay | Admitting: Psychiatry

## 2019-11-25 ENCOUNTER — Other Ambulatory Visit: Payer: Self-pay

## 2019-11-25 ENCOUNTER — Ambulatory Visit (INDEPENDENT_AMBULATORY_CARE_PROVIDER_SITE_OTHER): Payer: 59 | Admitting: Psychiatry

## 2019-11-25 DIAGNOSIS — F423 Hoarding disorder: Secondary | ICD-10-CM | POA: Diagnosis not present

## 2019-11-25 DIAGNOSIS — F3341 Major depressive disorder, recurrent, in partial remission: Secondary | ICD-10-CM | POA: Diagnosis not present

## 2019-11-25 DIAGNOSIS — F422 Mixed obsessional thoughts and acts: Secondary | ICD-10-CM

## 2019-11-25 DIAGNOSIS — F4010 Social phobia, unspecified: Secondary | ICD-10-CM

## 2019-11-25 DIAGNOSIS — G3184 Mild cognitive impairment, so stated: Secondary | ICD-10-CM

## 2019-11-25 MED ORDER — LAMOTRIGINE 200 MG PO TABS: 200.0000 mg | ORAL_TABLET | Freq: Every day | ORAL | 1 refills | Status: DC

## 2019-11-25 MED ORDER — BUSPIRONE HCL 30 MG PO TABS: 30.0000 mg | ORAL_TABLET | Freq: Two times a day (BID) | ORAL | 5 refills | Status: DC

## 2019-11-25 MED ORDER — ALPRAZOLAM 0.5 MG PO TABS: 0.5000 mg | ORAL_TABLET | Freq: Every day | ORAL | 2 refills | Status: DC | PRN

## 2019-11-25 MED ORDER — OLANZAPINE 15 MG PO TABS: ORAL_TABLET | ORAL | 0 refills | Status: DC

## 2019-11-25 MED ORDER — LITHIUM CARBONATE 150 MG PO CAPS: 150.0000 mg | ORAL_CAPSULE | Freq: Every day | ORAL | 3 refills | Status: DC

## 2019-11-25 MED ORDER — DONEPEZIL HCL 10 MG PO TABS: ORAL_TABLET | ORAL | 1 refills | Status: DC

## 2019-11-25 MED ORDER — FLUVOXAMINE MALEATE 100 MG PO TABS: ORAL_TABLET | ORAL | 1 refills | Status: DC

## 2019-11-25 NOTE — Progress Notes (Signed)
Zachary [name] [id number] May 25, 1960 59 y.o.  Subjective:   Patient ID:  Zachary [name] is a 59 y.o. (DOB [birth year]) male.  Chief Complaint:  Chief Complaint  Patient presents with  . Follow-up  . Anxiety  . Depression  . chronic cognitive complaints    Depression        Associated symptoms include no decreased concentration and no suicidal ideas.  Past medical history includes anxiety.   Anxiety Symptoms include nervous/anxious behavior. Patient reports no confusion, decreased concentration, dizziness or suicidal ideas.     Zachary [name] presents to the office today for follow-up of depression, obsessive anxiety and paranoia about body odor and getting dementia.  Did feel somewhat reassured about the fact that Dr. Marcos Eke said he does not have Alzheimer's dx.  visit October 16, 2018.  Initiated a trial of naltrexone off label 50 mg daily for impulse control purchasing.  01/16/2019 appointment with the following noted No benefit naltrexone.  Doesn't like his job but fears change in jobs bc "of the farting" and doesn't feel he'd be accepted in other places.  In this job for 16 years without a promotion.  There have bbeen layoffs and he hasn't gotten layoffs.   The same overall with mental health issues.  Nice new boss.   Worries over his thoughts of patient.  Mood stable.   Continued concerns over spending on things he doesn't need or even really want.  Books he'll never read or DVDs he'll never watch. Huge problem buying too much off the Internet.  Blew check from incentive on impulse purchase from the Internet.  Books and collectibles per usual.  Running up debt.  No other impulsivity.   Not hyper nor otherwise manic.    No history of bankruptcy.  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  Maybe out of boredom.  Needs to stop and can't control himself.Has not talked to anyone about it.  Doesn't get anxious around the spending.  Not depressed.  Busy at work.  Sleep fine.   Sometimes anxious about passing gas at work and not otherwise anxious there.  Rarely used Xanax.   Depression has remained under control. Overall he thinks the depression and anxiety levels have been "fine".  Friends notice his comprehension problems. Not sure he trusts the neuropsychological testing which was normal and did not show dementia as he fears.  Plan: No meds changed.  07/23/2019 appointment with the following noted: Concerns over concentration and memory.  Thinks he's having trouble with word-finding.  Bosses satisfied with work response and function. Plan: Check B12 and folated DT cognitive complaints. Trial reduction in olanzapine to see if cognition is better to 15 mg daily.  11/25/2019 appointment with the following noted: Thinks he had a lot of olanzapine 20 and kept taking it and never reduced it yet. No mental health changes since here.  Work very busy.  Regular anxiety thing but no depression.  Obsesses over farting and bothering people.  Patient denies difficulty with sleep initiation or maintenance over 8 hours. Denies appetite disturbance.  Patient reports that energy and motivation have been good.  Patient denies any difficulty with concentration.  Patient denies any suicidal ideation.  Good interest and enjoyment.  F has unknown cancer in chemo in early 37's.   Talk every other day.  Past psych med: Abilify, Zyprexa, Seroquel 600, W  Wellbutrin, nortriptyline, clomipramine, mirtazapine,  Lamotrigine, lithium no response, stimulants, buspirone 60, fluvoxamine, Aricept  Naltrexone for compulsive buying NR  Review of Systems:  Review of Systems  Gastrointestinal: Negative for abdominal distention.  Neurological: Negative for dizziness, tremors and weakness.  Psychiatric/Behavioral: Positive for depression. Negative for agitation, behavioral problems, confusion, decreased concentration, dysphoric mood, hallucinations, self-injury, sleep disturbance and suicidal ideas.  The patient is nervous/anxious. The patient is not hyperactive.     Medications: I have reviewed the patient's current medications.  Current Outpatient Medications  Medication Sig Dispense Refill  . ALPRAZolam (XANAX) 0.5 MG tablet Take 1 tablet (0.5 mg total) by mouth daily as needed. 30 tablet 2  . aspirin 81 MG chewable tablet Asprin Ec Low Dose 81 mg tablet,delayed release  Take 1 tablet every day by oral route.    . busPIRone (BUSPAR) 30 MG tablet Take 1 tablet (30 mg total) by mouth 2 (two) times daily. 60 tablet 5  . donepezil (ARICEPT) 10 MG tablet TAKE 1 TABLET(10 MG) BY MOUTH AT BEDTIME 90 tablet 1  . fluvoxaMINE (LUVOX) 100 MG tablet TAKE 1 TABLET BY MOUTH EVERY MORNING THEN TAKE 3 TABLETS BY MOUTH EVERY EVENING 360 tablet 1  . lamoTRIgine (LAMICTAL) 200 MG tablet Take 1 tablet (200 mg total) by mouth daily. 90 tablet 1  . levothyroxine (SYNTHROID) 75 MCG tablet TAKE 1 TABLET(75 MCG) BY MOUTH DAILY 90 tablet 3  . lipase/protease/amylase (CREON) 36000 UNITS CPEP capsule Take 4  by mouth before each meal and two with each snack 1440 capsule 3  . lithium carbonate 150 MG capsule Take 1 capsule (150 mg total) by mouth daily. 90 capsule 3  . losartan-hydrochlorothiazide (HYZAAR) 100-12.5 MG tablet TAKE 1 TABLET BY MOUTH DAILY 90 tablet 0  . Multiple Vitamin (MULTIVITAMIN WITH MINERALS) TABS Take 1 tablet by mouth daily.    . Na Sulfate-K Sulfate-Mg Sulf (SUPREP BOWEL PREP KIT) 17.5-3.13-1.6 GM/177ML SOLN Take 1 kit by mouth as directed. 324 mL 0  . OLANZapine (ZYPREXA) 15 MG tablet TAKE 1 TABLET(15 MG) BY MOUTH AT BEDTIME 90 tablet 0  . tamsulosin (FLOMAX) 0.4 MG CAPS capsule Take 0.8 mg by mouth daily.     No current facility-administered medications for this visit.    Medication Side Effects: None  Allergies:  Allergies  Allergen Reactions  . Erythromycin     Pt unsure, reports he recently received a -mycin drug with no reaction.   Marland Kitchen Penicillins     REACTION: "it may kill  me"  . Tetracyclines & Related Hives    Past Medical History:  Diagnosis Date  . Allergy   . Anxiety   . Crohn disease ([place])    pt reports subsequent testing was normal  . Depression   . Herpes simplex   . IBS (irritable bowel syndrome)   . Incontinence     Family History  Problem Relation Age of Onset  . Arthritis Mother   . Stroke Mother   . Depression Mother   . Hypertension Father   . Prostate cancer Father 65  . Cancer Maternal Aunt   . Prostate cancer Maternal Uncle   . Breast cancer Maternal Grandmother   . Cancer Maternal Grandfather   . Cancer Paternal Grandmother   . Prostate cancer Paternal Uncle   . Colon cancer Neg Hx   . Rectal cancer Neg Hx   . Stomach cancer Neg Hx   . Esophageal cancer Neg Hx   . Pancreatic cancer Neg Hx     Social History   Socioeconomic History  . Marital status: Single    Spouse name: Not on file  .  Number of children: Not on file  . Years of education: Not on file  . Highest education level: Not on file  Occupational History  . Not on file  Tobacco Use  . Smoking status: Former Smoker    Quit date: 1980    Years since quitting: 41.9  . Smokeless tobacco: Never Used  Vaping Use  . Vaping Use: Never used  Substance and Sexual Activity  . Alcohol use: No  . Drug use: No  . Sexual activity: Not Currently  Other Topics Concern  . Not on file  Social History Narrative  . Not on file   Social Determinants of Health   Financial Resource Strain:   . Difficulty of Paying Living Expenses: Not on file  Food Insecurity:   . Worried About Charity fundraiser in the Last Year: Not on file  . Ran Out of Food in the Last Year: Not on file  Transportation Needs:   . Lack of Transportation (Medical): Not on file  . Lack of Transportation (Non-Medical): Not on file  Physical Activity:   . Days of Exercise per Week: Not on file  . Minutes of Exercise per Session: Not on file  Stress:   . Feeling of Stress : Not on file   Social Connections:   . Frequency of Communication with Friends and Family: Not on file  . Frequency of Social Gatherings with Friends and Family: Not on file  . Attends Religious Services: Not on file  . Active Member of Clubs or Organizations: Not on file  . Attends Archivist Meetings: Not on file  . Marital Status: Not on file  Intimate Partner Violence:   . Fear of Current or Ex-Partner: Not on file  . Emotionally Abused: Not on file  . Physically Abused: Not on file  . Sexually Abused: Not on file    Past Medical History, Surgical history, Social history, and Family history were reviewed and updated as appropriate.   Please see review of systems for further details on the patient's review from today.   Objective:   Physical Exam:  There were no vitals taken for this visit.  Physical Exam Constitutional:      General: He is not in acute distress.    Appearance: He is well-developed.  Musculoskeletal:        General: No deformity.  Neurological:     Mental Status: He is alert and oriented to person, place, and time.     Coordination: Coordination normal.  Psychiatric:        Attention and Perception: Attention normal. He is attentive.        Mood and Affect: Mood is anxious. Mood is not depressed. Affect is blunt. Affect is not labile, angry or inappropriate.        Speech: Speech is not rapid and pressured, delayed or slurred.        Behavior: Behavior normal. Behavior is not agitated or hyperactive.        Thought Content: Thought content is paranoid. Thought content does not include homicidal or suicidal ideation. Thought content does not include homicidal or suicidal plan.        Cognition and Memory: Cognition normal.        Judgment: Judgment is inappropriate.     Comments: Insight is fair. Chronic obs/paranoia about passing gas at work is borderline delusional. Compuslive spending collectibles. Chronic hypoverbal     Lab Review:     Component  Value Date/Time  NA 129 (L) 04/24/2019 1053   NA 139 09/11/2018 1120   K 4.7 04/24/2019 1053   CL 93 (L) 04/24/2019 1053   CO2 30 04/24/2019 1053   GLUCOSE 93 04/24/2019 1053   BUN 9 04/24/2019 1053   BUN 7 09/11/2018 1120   CREATININE 1.19 04/24/2019 1053   CREATININE 0.91 07/30/2013 0913   CALCIUM 9.2 04/24/2019 1053   PROT 7.6 04/24/2019 1053   PROT 7.2 09/11/2018 1120   ALBUMIN 4.5 04/24/2019 1053   ALBUMIN 4.6 09/11/2018 1120   AST 25 04/24/2019 1053   ALT 32 04/24/2019 1053   ALKPHOS 71 04/24/2019 1053   BILITOT 0.4 04/24/2019 1053   BILITOT 0.3 09/11/2018 1120   GFRNONAA 78 09/11/2018 1120   GFRAA 90 09/11/2018 1120       Component Value Date/Time   WBC 9.8 04/24/2019 1053   RBC 4.56 04/24/2019 1053   HGB 14.6 04/24/2019 1053   HGB 14.8 09/11/2018 1120   HCT 42.4 04/24/2019 1053   HCT 43.3 09/11/2018 1120   PLT 360.0 04/24/2019 1053   PLT 363 09/11/2018 1120   MCV 93.0 04/24/2019 1053   MCV 92 09/11/2018 1120   MCH 31.4 09/11/2018 1120   MCH 31.1 05/25/2016 0739   MCHC 34.4 04/24/2019 1053   RDW 13.1 04/24/2019 1053   RDW 13.0 09/11/2018 1120   LYMPHSABS 1.9 04/24/2019 1053   LYMPHSABS 2.1 09/11/2018 1120   MONOABS 1.0 04/24/2019 1053   EOSABS 0.3 04/24/2019 1053   EOSABS 0.3 09/11/2018 1120   BASOSABS 0.1 04/24/2019 1053   BASOSABS 0.1 09/11/2018 1120    Lithium Lvl  Date Value Ref Range Status  04/18/2018 <0.3 (L) 0.6 - 1.2 mmol/L Final    Comment:    Verified by repeat analysis. Marland Kitchen      10/26/2019 B12 680  No results found for: PHENYTOIN, PHENOBARB, VALPROATE, CBMZ   .res Assessment: Plan:    Tina was seen today for follow-up, anxiety, depression and chronic cognitive complaints.  Diagnoses and all orders for this visit:  Recurrent major depression in partial remission ([place]) -     lamoTRIgine (LAMICTAL) 200 MG tablet; Take 1 tablet (200 mg total) by mouth daily. -     OLANZapine (ZYPREXA) 15 MG tablet; TAKE 1 TABLET(15 MG) BY MOUTH  AT BEDTIME  Hoarding disorder -     fluvoxaMINE (LUVOX) 100 MG tablet; TAKE 1 TABLET BY MOUTH EVERY MORNING THEN TAKE 3 TABLETS BY MOUTH EVERY EVENING  Mixed obsessional thoughts and acts -     busPIRone (BUSPAR) 30 MG tablet; Take 1 tablet (30 mg total) by mouth 2 (two) times daily. -     fluvoxaMINE (LUVOX) 100 MG tablet; TAKE 1 TABLET BY MOUTH EVERY MORNING THEN TAKE 3 TABLETS BY MOUTH EVERY EVENING -     OLANZapine (ZYPREXA) 15 MG tablet; TAKE 1 TABLET(15 MG) BY MOUTH AT BEDTIME  Social anxiety disorder -     busPIRone (BUSPAR) 30 MG tablet; Take 1 tablet (30 mg total) by mouth 2 (two) times daily. -     ALPRAZolam (XANAX) 0.5 MG tablet; Take 1 tablet (0.5 mg total) by mouth daily as needed. -     fluvoxaMINE (LUVOX) 100 MG tablet; TAKE 1 TABLET BY MOUTH EVERY MORNING THEN TAKE 3 TABLETS BY MOUTH EVERY EVENING -     OLANZapine (ZYPREXA) 15 MG tablet; TAKE 1 TABLET(15 MG) BY MOUTH AT BEDTIME  Mild cognitive impairment -     donepezil (ARICEPT) 10 MG tablet; TAKE  1 TABLET(10 MG) BY MOUTH AT BEDTIME -     lithium carbonate 150 MG capsule; Take 1 capsule (150 mg total) by mouth daily.   [name] has been diagnosed with treatment resistant depression with psychotic features, OCD and social anxiety but schizoaffective may be more appropriate diagnosis given this chronic paranoid thinking.  Specifically he is chronically  suspicious that he has dementia despite neuropsychological testing which tells him he does not.  He also has chronic thoughts that other people find his odor offensive because of passing gas.  There is very little evidence to support this.  He has been under my psychiatric care for many many years and never owed noticed body odor and appointments at any time.  He also suspects that people are thinking negatively of him regularly.  Overall his paranoia and anxiety and depression seem improved since the increase in olanzapine.  We will therefore continue his current  dosages.  Discussed the risk of serotonin syndrome with the writer than usual dose of fluvoxamine but again this appears to have been helpful for his anxiety and obsessive thinking.  Discussed potential metabolic side effects associated with atypical antipsychotics, as well as potential risk for movement side effects. Advised pt to contact office if movement side effects occur.  No evidence of movement disorder. Still obsessing on farting and thinking others notice but it's never been noticeable in years of seeing him and he never gets complaints from other about it.  Discussed the risk of polypharmacy but again if appears medically necessary, helpful and well tolerated.  Trial reduction in olanzapine to see if cognition is better to 15 mg daily. Cautioned about worsening mood and anxiety.  FU 4 mos.  Lynder Parents, MD, DFAPA  Please see After Visit Summary for patient specific instructions.  Future Appointments  Date Time Provider [place]  12/22/2019 10:00 AM Ladene Artist, MD [facility]    No orders of the defined types were placed in this encounter.     -------------------------------

## 2019-12-08 ENCOUNTER — Other Ambulatory Visit: Payer: Self-pay | Admitting: Family Medicine

## 2019-12-08 DIAGNOSIS — I10 Essential (primary) hypertension: Secondary | ICD-10-CM

## 2019-12-10 ENCOUNTER — Encounter: Payer: Self-pay | Admitting: Family Medicine

## 2019-12-10 ENCOUNTER — Other Ambulatory Visit: Payer: Self-pay

## 2019-12-10 ENCOUNTER — Ambulatory Visit: Payer: 59 | Admitting: Family Medicine

## 2019-12-10 VITALS — BP 120/84 | HR 72 | Ht 74.0 in | Wt 250.8 lb

## 2019-12-10 DIAGNOSIS — R0789 Other chest pain: Secondary | ICD-10-CM | POA: Diagnosis not present

## 2019-12-10 DIAGNOSIS — R159 Full incontinence of feces: Secondary | ICD-10-CM | POA: Diagnosis not present

## 2019-12-10 NOTE — Patient Instructions (Signed)
  Use moist heat 15 minutes three times daily to the area of pain at the left chest. Take Aleve twice daily with food.  If this isn't helping much, and if it isn't bothering your stomach, you can take TWO aleve together (with food) twice daily. You can take the Aleve for a week. If not improving, please let us know.  Next step includes an x-ray to evaluate for underlying reasons for the pain. I'm suspecting a muscle strain to the area as a diagnosis currently.

## 2019-12-10 NOTE — Progress Notes (Signed)
Chief Complaint  Patient presents with  . Rib pain    Left sided rib pain for a couple of weeks. Hurts when he moves sometimes, even when he sneezes.    Patient presents with complaint of left-sided pain in the lower chest. He descrbies the pain as being in the ribs on the left, below his "man breast". Hurts with certain positions, sneezing, leaning forwards. This started started a few weeks ago, got worse last night, after a sneeze, though not as bad currently.  No known trauma, injury or change in activity. He does report that one day when taking a break in is car, he wasn't sitting quite right, started having pain at the R shoulder blade. This has resolved.  Hasn't tried any meds or treatment yet.  He has a few other questions-- Asking about recent PSA results. He has had some fecal incontinence--leakage when he "farts". Taking metamucil.  Feces is soft, like pudding. He is having a colonoscopy next week.  PMH, PSH, SH reviewed  Outpatient Encounter Medications as of 12/10/2019  Medication Sig  . ALPRAZolam (XANAX) 0.5 MG tablet Take 1 tablet (0.5 mg total) by mouth daily as needed.  Marland Kitchen aspirin 81 MG chewable tablet Asprin Ec Low Dose 81 mg tablet,delayed release  Take 1 tablet every day by oral route.  . busPIRone (BUSPAR) 30 MG tablet Take 1 tablet (30 mg total) by mouth 2 (two) times daily.  Marland Kitchen donepezil (ARICEPT) 10 MG tablet TAKE 1 TABLET(10 MG) BY MOUTH AT BEDTIME  . fluvoxaMINE (LUVOX) 100 MG tablet TAKE 1 TABLET BY MOUTH EVERY MORNING THEN TAKE 3 TABLETS BY MOUTH EVERY EVENING  . lamoTRIgine (LAMICTAL) 200 MG tablet Take 1 tablet (200 mg total) by mouth daily.  Marland Kitchen levothyroxine (SYNTHROID) 75 MCG tablet TAKE 1 TABLET(75 MCG) BY MOUTH DAILY  . lipase/protease/amylase (CREON) 36000 UNITS CPEP capsule Take 4  by mouth before each meal and two with each snack  . lithium carbonate 150 MG capsule Take 1 capsule (150 mg total) by mouth daily.  Marland Kitchen losartan-hydrochlorothiazide (HYZAAR)  100-12.5 MG tablet TAKE 1 TABLET BY MOUTH DAILY  . Multiple Vitamin (MULTIVITAMIN WITH MINERALS) TABS Take 1 tablet by mouth daily.  Marland Kitchen OLANZapine (ZYPREXA) 15 MG tablet TAKE 1 TABLET(15 MG) BY MOUTH AT BEDTIME  . tamsulosin (FLOMAX) 0.4 MG CAPS capsule Take 0.8 mg by mouth daily.  . Na Sulfate-K Sulfate-Mg Sulf (SUPREP BOWEL PREP KIT) 17.5-3.13-1.6 GM/177ML SOLN Take 1 kit by mouth as directed. (Patient not taking: Reported on 12/10/2019)   No facility-administered encounter medications on file as of 12/10/2019.    Allergies  Allergen Reactions  . Erythromycin     Pt unsure, reports he recently received a -mycin drug with no reaction.   Marland Kitchen Penicillins     REACTION: "it may kill me"  . Tetracyclines & Related Hives   ROS: no fever, chills, headaches, URI symptoms.  L sided rib/chest wall pain, pain with breathing per HPI. No cough or shortness of breath.  Some allergies/sneezing. Some fecal leakage with passing gas.  No abdominal pain, blood in stool. See HPI   PHYSICAL EXAM:  BP 120/84   Pulse 72   Ht _0  (1.88 m)   Wt 250 lb 12.8 oz (113.8 kg)   BMI 32.20 kg/m    Well-appearing male, slightly anxious, in no distress HEENT: conjunctiva and sclera are clear, EOMI, wearing mask Heart: regular rate and rhythm Lungs: clear bilaterally Neck: no lymphadenopathy or mass Back: Spine nontender. No  CVA tenderness or tenderness around scapula. No muscle spasm. Chest: Diffuse area (about the size of palm) of tenderness on the left chest, below the nipple/breast area. No focal bony tenderness, no mass.  Some of the tender area is bony, others intercostal muscles, but discomfort is fairly generalized. No costochondral tenderness Abdomen: very mild tenderness in epigastrium.  nontender elsewhere, no mass Psych: slightly anxious, normal affect, grooming Neuro: alert and oriented, normal gait   ASSESSMENT/PLAN:  Left-sided chest wall pain - suspect muscle strain (?related to sneeze); no  focal bony tenderness or trauma. Supportive measures. Xray if pain persists  Incontinence of feces, unspecified fecal incontinence type - Encouraged to continue metamucil. Discussed possibles causes, and that he should further address with his GI (has colonoscopy next week)  Chart reviewed, meds reviewed. Normal Cr, no contraindications to NSAIDs, will use OTC Aleve and heat. Xray if persistent/worsening pain. Reassured PSA normal. Ddx of fecal leakage reviewed--to cont metamucil to keep stools bulked up. To address with GI, has appt next week, who can assess sphincter tone among other things.  Use moist heat 15 minutes three times daily to the area of pain at the left chest. Take Aleve twice daily with food.  If this isn't helping much, and if it isn't bothering your stomach, you can take TWO aleve together (with food) twice daily. You can take the Aleve for a week. If not improving, please let us know.  Next step includes an x-ray to evaluate for underlying reasons for the pain. I'm suspecting a muscle strain to the area as a diagnosis currently.  I spent 32 minutes dedicated to the care of this patient, including pre-visit review of records, face to face time, post-visit ordering of testing and documentation.

## 2019-12-22 ENCOUNTER — Encounter: Payer: 59 | Admitting: Gastroenterology

## 2020-01-13 ENCOUNTER — Ambulatory Visit (AMBULATORY_SURGERY_CENTER): Payer: Self-pay | Admitting: *Deleted

## 2020-01-13 ENCOUNTER — Other Ambulatory Visit: Payer: Self-pay

## 2020-01-13 VITALS — Ht 74.0 in | Wt 253.0 lb

## 2020-01-13 DIAGNOSIS — Z8719 Personal history of other diseases of the digestive system: Secondary | ICD-10-CM

## 2020-01-13 DIAGNOSIS — R159 Full incontinence of feces: Secondary | ICD-10-CM

## 2020-01-13 DIAGNOSIS — K8689 Other specified diseases of pancreas: Secondary | ICD-10-CM

## 2020-01-13 NOTE — Progress Notes (Signed)
Completed covid vaccines   Pt has Suprep already  Pt is aware that care partner will wait in the car during procedure; if they feel like they will be too hot or cold to wait in the car; they may wait in the 4 th floor lobby. Patient is aware to bring only one care partner. We want them to wear a mask (we do not have any that we can provide them), practice social distancing, and we will check their temperatures when they get here.  I did remind the patient that their care partner needs to stay in the parking lot the entire time and have a cell phone available, we will call them when the pt is ready for discharge. Patient will wear mask into building.   No trouble with anesthesia, denies being told they were difficult to intubate, or hx/fam hx of malignant hyperthermia per pt   No egg or soy allergy  No home oxygen use   No medications for weight loss taken  emmi information given  Pt denies constipation issues

## 2020-01-18 ENCOUNTER — Encounter: Payer: Self-pay | Admitting: Gastroenterology

## 2020-01-19 ENCOUNTER — Encounter: Payer: 59 | Admitting: Gastroenterology

## 2020-01-22 ENCOUNTER — Encounter: Payer: 59 | Admitting: Gastroenterology

## 2020-01-25 ENCOUNTER — Ambulatory Visit (AMBULATORY_SURGERY_CENTER): Payer: 59 | Admitting: Gastroenterology

## 2020-01-25 ENCOUNTER — Encounter: Payer: Self-pay | Admitting: Gastroenterology

## 2020-01-25 ENCOUNTER — Other Ambulatory Visit: Payer: Self-pay | Admitting: Gastroenterology

## 2020-01-25 ENCOUNTER — Other Ambulatory Visit: Payer: Self-pay

## 2020-01-25 VITALS — BP 109/65 | HR 69 | Temp 97.1°F | Resp 14 | Ht 74.0 in | Wt 253.0 lb

## 2020-01-25 DIAGNOSIS — D125 Benign neoplasm of sigmoid colon: Secondary | ICD-10-CM

## 2020-01-25 DIAGNOSIS — K635 Polyp of colon: Secondary | ICD-10-CM

## 2020-01-25 DIAGNOSIS — R197 Diarrhea, unspecified: Secondary | ICD-10-CM | POA: Diagnosis not present

## 2020-01-25 DIAGNOSIS — K5080 Crohn's disease of both small and large intestine without complications: Secondary | ICD-10-CM

## 2020-01-25 DIAGNOSIS — K529 Noninfective gastroenteritis and colitis, unspecified: Secondary | ICD-10-CM | POA: Diagnosis not present

## 2020-01-25 MED ORDER — SODIUM CHLORIDE 0.9 % IV SOLN: 500.0000 mL | Freq: Once | INTRAVENOUS | Status: DC

## 2020-01-25 MED ORDER — BUDESONIDE 3 MG PO CPEP: 9.0000 mg | ORAL_CAPSULE | Freq: Every day | ORAL | 2 refills | Status: DC

## 2020-01-25 NOTE — Progress Notes (Signed)
Called to room to assist during endoscopic procedure.  Patient ID and intended procedure confirmed with present staff. Received instructions for my participation in the procedure from the performing physician.  

## 2020-01-25 NOTE — Progress Notes (Signed)
No problems noted in the recovery room. maw 

## 2020-01-25 NOTE — Progress Notes (Signed)
To PACU, VSS. Report to RN.tb 

## 2020-01-25 NOTE — Progress Notes (Signed)
Pt's states no medical or surgical changes since previsit or office visit. 

## 2020-01-25 NOTE — Patient Instructions (Addendum)
Handouts were given to you on Colon Polyps and Crohn's Disease. A prescription was sent in to [facility] for Entocort.  Please pick up Rx and begin taking. You may resume your other current medications today. Await biopsy results. May take 1-3 weeks to receive pathology results. The office will call to schedule a follow up appointment to see either Dr. Fuller Plan or Alonza Bogus, PA-C in 1 month. Please call if any questions or concerns.     YOU HAD AN ENDOSCOPIC PROCEDURE TODAY AT [facility]:   Refer to the procedure report that was given to you for any specific questions about what was found during the examination.  If the procedure report does not answer your questions, please call your gastroenterologist to clarify.  If you requested that your care partner not be given the details of your procedure findings, then the procedure report has been included in a sealed envelope for you to review at your convenience later.  YOU SHOULD EXPECT: Some feelings of bloating in the abdomen. Passage of more gas than usual.  Walking can help get rid of the air that was put into your GI tract during the procedure and reduce the bloating. If you had a lower endoscopy (such as a colonoscopy or flexible sigmoidoscopy) you may notice spotting of blood in your stool or on the toilet paper. If you underwent a bowel prep for your procedure, you may not have a normal bowel movement for a few days.  Please Note:  You might notice some irritation and congestion in your nose or some drainage.  This is from the oxygen used during your procedure.  There is no need for concern and it should clear up in a day or so.  SYMPTOMS TO REPORT IMMEDIATELY:   Following lower endoscopy (colonoscopy or flexible sigmoidoscopy):  Excessive amounts of blood in the stool  Significant tenderness or worsening of abdominal pains  Swelling of the abdomen that is new, acute  Fever of 100F or higher   For urgent or emergent  issues, a gastroenterologist can be reached at any hour by calling [phone]. Do not use MyChart messaging for urgent concerns.    DIET:  We do recommend a small meal at first, but then you may proceed to your regular diet.  Drink plenty of fluids but you should avoid alcoholic beverages for 24 hours.  ACTIVITY:  You should plan to take it easy for the rest of today and you should NOT DRIVE or use heavy machinery until tomorrow (because of the sedation medicines used during the test).    FOLLOW UP: Our staff will call the number listed on your records 48-72 hours following your procedure to check on you and address any questions or concerns that you may have regarding the information given to you following your procedure. If we do not reach you, we will leave a message.  We will attempt to reach you two times.  During this call, we will ask if you have developed any symptoms of COVID 19. If you develop any symptoms (ie: fever, flu-like symptoms, shortness of breath, cough etc.) before then, please call [phone].  If you test positive for Covid 19 in the 2 weeks post procedure, please call and report this information to [place].    If any biopsies were taken you will be contacted by phone or by letter within the next 1-3 weeks.  Please call us at [phone] if you have not heard about the biopsies in  3 weeks.    SIGNATURES/CONFIDENTIALITY: You and/or your care partner have signed paperwork which will be entered into your electronic medical record.  These signatures attest to the fact that that the information above on your After Visit Summary has been reviewed and is understood.  Full responsibility of the confidentiality of this discharge information lies with you and/or your care-partner.

## 2020-01-25 NOTE — Progress Notes (Signed)
S. M. RN vital signs.

## 2020-01-25 NOTE — Op Note (Signed)
[facility] Patient Name: Zachary [name] Procedure Date: 01/25/2020 2:48 PM MRN: [id number] Endoscopist: Ladene Artist , MD Age: 60 Referring MD:  Date of Birth: [birth year] Gender: Male Account #: [id number] Procedure:                Colonoscopy Indications:              Suspected Crohn's disease of the small bowel and                            colon, Chronic diarrhea Medicines:                Monitored Anesthesia Care Procedure:                Pre-Anesthesia Assessment:                           - Prior to the procedure, a History and Physical                            was performed, and patient medications and                            allergies were reviewed. The patient's tolerance of                            previous anesthesia was also reviewed. The risks                            and benefits of the procedure and the sedation                            options and risks were discussed with the patient.                            All questions were answered, and informed consent                            was obtained. Prior Anticoagulants: The patient has                            taken no previous anticoagulant or antiplatelet                            agents. ASA Grade Assessment: II - A patient with                            mild systemic disease. After reviewing the risks                            and benefits, the patient was deemed in                            satisfactory condition to undergo the procedure.  After obtaining informed consent, the colonoscope                            was passed under direct vision. Throughout the                            procedure, the patient's blood pressure, pulse, and                            oxygen saturations were monitored continuously. The                            Olympus CF-HQ190 [id number]) Colonoscope was                            introduced through the anus and advanced  to the the                            terminal ileum, with identification of the                            appendiceal orifice and IC valve. The ileocecal                            valve, appendiceal orifice, and rectum were                            photographed. The quality of the bowel preparation                            was good. The colonoscopy was performed without                            difficulty. The patient tolerated the procedure                            well. Scope In: 2:54:41 PM Scope Out: 3:13:07 PM Scope Withdrawal Time: 0 hours 13 minutes 41 seconds  Total Procedure Duration: 0 hours 18 minutes 26 seconds  Findings:                 The perianal and digital rectal examinations were                            normal.                           The terminal ileum contained a few localized                            non-bleeding erosions. No stigmata of recent                            bleeding were seen. Biopsies were taken with a cold  forceps for histology.                           Two 5-7 mm ulcers were found at the ileocecal                            valve. No bleeding was present. No stigmata of                            recent bleeding were seen. Biopsies were taken with                            a cold forceps for histology.                           A 8 mm polyp was found in the sigmoid colon. The                            polyp was sessile. The polyp was removed with a                            cold snare. Resection and retrieval were complete.                           The exam was otherwise without abnormality on                            direct and retroflexion views. Random biopsies                            taken with a cold forceps for histology. Complications:            No immediate complications. Estimated blood loss:                            None. Estimated Blood Loss:     Estimated blood loss:  none. Impression:               - A few erosions in the terminal ileum. Biopsied.                           - Two ulcers at the ileocecal valve. Biopsied.                           - Sessile sigmoid colon polyp. Removed and                            retrieved.                           - The examination was otherwise normal on direct                            and retroflexion views. Biopsied. Recommendation:           -  Repeat colonoscopy after studies are complete for                            surveillance based on pathology results.                           - Patient has a contact number available for                            emergencies. The signs and symptoms of potential                            delayed complications were discussed with the                            patient. Return to normal activities tomorrow.                            Written discharge instructions were provided to the                            patient.                           - Resume previous diet.                           - Continue present medications.                           - Entocort 9 mg po qd, 2 months of refills.                           - Await pathology results.                           - Return to GI office with me or Jess Zehr, PA-C in                            1 month. Ladene Artist, MD 01/25/2020 3:20:57 PM This report has been signed electronically.

## 2020-01-27 ENCOUNTER — Telehealth: Payer: Self-pay

## 2020-01-27 NOTE — Telephone Encounter (Signed)
Left message on follow up call. 

## 2020-01-27 NOTE — Telephone Encounter (Signed)
No answer, unable to leave a message, B.Haydn Cush RN.

## 2020-02-09 ENCOUNTER — Encounter: Payer: Self-pay | Admitting: Gastroenterology

## 2020-02-29 ENCOUNTER — Ambulatory Visit: Payer: 59 | Admitting: Gastroenterology

## 2020-02-29 ENCOUNTER — Encounter: Payer: Self-pay | Admitting: Gastroenterology

## 2020-02-29 VITALS — BP 130/84 | HR 68 | Ht 73.0 in | Wt 247.0 lb

## 2020-02-29 DIAGNOSIS — K50818 Crohn's disease of both small and large intestine with other complication: Secondary | ICD-10-CM | POA: Diagnosis not present

## 2020-02-29 NOTE — Patient Instructions (Signed)
Continue budesonide.   Thank you for choosing me and [facility].  Pricilla Riffle. Dagoberto Ligas., MD., Marval Regal

## 2020-02-29 NOTE — Progress Notes (Signed)
    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 Started on budesonide.  He states he has had 2 episodes of diarrhea since starting budesonide.  Significantly improved.  Current Medications, Allergies, Past Medical History, Past Surgical History, Family History and Social History were reviewed in [employer] record.   Physical Exam: General: Well developed, well nourished, no acute distress Head: Normocephalic and atraumatic Eyes: Sclerae anicteric, EOMI Ears: Normal auditory acuity Mouth: Not examined, mask on during Covid-19 pandemic Lungs: Clear throughout to auscultation Heart: Regular rate and rhythm; no murmurs, rubs or bruits Abdomen: Soft, mild RLQ tenderness and non distended. No masses, hepatosplenomegaly or hernias noted. Normal Bowel sounds Rectal: Not done Musculoskeletal: Symmetrical with no gross deformities  Pulses:  Normal pulses noted Extremities: No clubbing, cyanosis, edema or deformities noted Neurological: Alert oriented x 4, grossly nonfocal Psychological:  Alert and cooperative. Normal mood and affect   Assessment and Recommendations:  1.  Crohn's ileocolitis with mild, localized activity.  Continue budesonide 9 mg daily and reassess symptoms in 2 months.  Consider a change therapy if symptoms are not adequately controlled.  Patient is advised to determine if certain foods or beverages are exacerbating diarrhea on certain days. REV in 2 months.   2.  Pancreatic insufficiency.  Continue Creon for pills with meals and 2 prior to snacks.  Maintain a fat modified diet.

## 2020-03-04 ENCOUNTER — Encounter (HOSPITAL_COMMUNITY): Payer: Self-pay

## 2020-03-04 ENCOUNTER — Emergency Department (HOSPITAL_COMMUNITY): Payer: 59

## 2020-03-04 ENCOUNTER — Ambulatory Visit (HOSPITAL_COMMUNITY)
Admission: EM | Admit: 2020-03-04 | Discharge: 2020-03-04 | Disposition: A | Discharge status: Home / Self Care | Payer: 59 | Source: Home / Self Care | Attending: Internal Medicine | Admitting: Internal Medicine

## 2020-03-04 ENCOUNTER — Other Ambulatory Visit: Payer: Self-pay

## 2020-03-04 ENCOUNTER — Ambulatory Visit (INDEPENDENT_AMBULATORY_CARE_PROVIDER_SITE_OTHER): Payer: 59

## 2020-03-04 ENCOUNTER — Emergency Department (HOSPITAL_COMMUNITY)
Admission: EM | Admit: 2020-03-04 | Discharge: 2020-03-04 | Disposition: A | Discharge status: Home / Self Care | Payer: 59 | Attending: Emergency Medicine | Admitting: Emergency Medicine

## 2020-03-04 DIAGNOSIS — I10 Essential (primary) hypertension: Secondary | ICD-10-CM | POA: Insufficient documentation

## 2020-03-04 DIAGNOSIS — Z87891 Personal history of nicotine dependence: Secondary | ICD-10-CM | POA: Diagnosis not present

## 2020-03-04 DIAGNOSIS — Z79899 Other long term (current) drug therapy: Secondary | ICD-10-CM | POA: Diagnosis not present

## 2020-03-04 DIAGNOSIS — R0981 Nasal congestion: Secondary | ICD-10-CM | POA: Insufficient documentation

## 2020-03-04 DIAGNOSIS — E871 Hypo-osmolality and hyponatremia: Secondary | ICD-10-CM | POA: Insufficient documentation

## 2020-03-04 DIAGNOSIS — R509 Fever, unspecified: Secondary | ICD-10-CM | POA: Insufficient documentation

## 2020-03-04 DIAGNOSIS — R079 Chest pain, unspecified: Secondary | ICD-10-CM | POA: Diagnosis not present

## 2020-03-04 DIAGNOSIS — Z20822 Contact with and (suspected) exposure to covid-19: Secondary | ICD-10-CM | POA: Insufficient documentation

## 2020-03-04 DIAGNOSIS — R059 Cough, unspecified: Secondary | ICD-10-CM | POA: Diagnosis present

## 2020-03-04 DIAGNOSIS — R058 Other specified cough: Secondary | ICD-10-CM

## 2020-03-04 DIAGNOSIS — J069 Acute upper respiratory infection, unspecified: Secondary | ICD-10-CM | POA: Diagnosis not present

## 2020-03-04 DIAGNOSIS — R1013 Epigastric pain: Secondary | ICD-10-CM | POA: Insufficient documentation

## 2020-03-04 LAB — BASIC METABOLIC PANEL
Anion gap: 10 (ref 5–15)
BUN: 13 mg/dL (ref 6–20)
CO2: 26 mmol/L (ref 22–32)
Calcium: 9.1 mg/dL (ref 8.9–10.3)
Chloride: 93 mmol/L — ABNORMAL LOW (ref 98–111)
Creatinine, Ser: 1.11 mg/dL (ref 0.61–1.24)
GFR, Estimated: >60 mL/min (ref >60)
Glucose, Bld: 87 mg/dL (ref 70–99)
Potassium: 3.9 mmol/L (ref 3.5–5.1)
Sodium: 129 mmol/L — ABNORMAL LOW (ref 135–145)

## 2020-03-04 LAB — TROPONIN I (HIGH SENSITIVITY)
Troponin I (High Sensitivity): 3 ng/L (ref <18)
Troponin I (High Sensitivity): 3 ng/L (ref <18)

## 2020-03-04 LAB — CBC
HCT: 39 % (ref 39.0–52.0)
Hemoglobin: 13.7 g/dL (ref 13.0–17.0)
MCH: 32.2 pg (ref 26.0–34.0)
MCHC: 35.1 g/dL (ref 30.0–36.0)
MCV: 91.5 fL (ref 80.0–100.0)
Platelets: 318 10*3/uL (ref 150–400)
RBC: 4.26 MIL/uL (ref 4.22–5.81)
RDW: 13 % (ref 11.5–15.5)
WBC: 9.2 10*3/uL (ref 4.0–10.5)
nRBC: 0 % (ref 0.0–0.2)

## 2020-03-04 NOTE — Discharge Instructions (Signed)
Thank you for allowing me to care for you today in the Emergency Department.   Your symptoms today are most consistent with a viral syndrome.  You can take over-the-counter cough and cold medication as directed on label for your symptoms.  You do have a COVID-19 test that is pending today.  You should receive a call from the hospital if it is positive.  However, since your symptoms have already been ongoing for 7 days, you do not need to quarantine at home, but should wear a mask in public until your symptoms have been present for at least 10 days based on CDC guidelines.  Your sodium level was slightly low today.  Please follow-up with your primary care provider to have this rechecked.  Return to the emergency department if you develop respiratory distress, if you pass out, if you develop chest pain, severe abdominal pain with a fever, uncontrollable vomiting, or other new, concerning symptoms.

## 2020-03-04 NOTE — ED Provider Notes (Signed)
[facility] EMERGENCY DEPARTMENT Provider Note   CSN: [id number] Arrival date & time: 03/04/20  1926     History Chief Complaint  Patient presents with  . Cough    Zachary [name] is a 60 y.o. male with a history of Crohn's disease, HLD, HTN who presents the emergency department from urgent care with a chief complaint of cough.  The patient presents with a 1 week history of cough, nasal congestion, chills, sore throat, and loose stools.  Sore throat has since resolved.  Cough is intermittently productive.  He reports that he also developed epigastric pain along with cough and nasal congestion that was initially constant, but has now become intermittent and is only present when he is coughing.  Pain resolves when he is not coughing.  He denies chest pain, shortness of breath, vomiting, rash, leg swelling, orthopnea, loss of sense of taste or smell, or GU symptoms.  He has had multiple COVID-19 test this week, including a PCR test, that have been negative.  Today, he presented to urgent care because his symptoms have not resolved and he was concerned that he may have walking pneumonia.  No known sick contacts.  No treatment for symptoms prior to arrival.  He endorses tobacco use.  No alcohol use.  No illicit or recreational substance use.  The history is provided by the patient and medical records. No language interpreter was used.       Past Medical History:  Diagnosis Date  . Allergy   . Anxiety   . Crohn disease ([place])    pt reports subsequent testing was normal  . Depression   . GERD (gastroesophageal reflux disease)   . Herpes simplex   . Hyperlipidemia   . Hypertension   . IBS (irritable bowel syndrome)   . Incontinence   . Irregular heart beat     Patient Active Problem List   Diagnosis Date Noted  . Incontinence of feces 11/03/2019  . Right sided abdominal pain 11/03/2019  . OAB (overactive bladder) 10/26/2019  . Pancreatic insufficiency  10/26/2019  . Spinal stenosis of lumbar region 03/03/2018  . Essential hypertension 01/27/2018  . OCD (obsessive compulsive disorder) 10/10/2017  . Social anxiety disorder 10/10/2017  . Seasonal allergies 06/26/2010  . History of herpes labialis 06/26/2010  . FLATULENCE ERUCTATION AND GAS PAIN 09/23/2007  . Major depression 09/22/2007  . CROHN'S DISEASE, LARGE AND SMALL INTESTINES 09/22/2007  . COLONIC POLYPS, HYPERPLASTIC, HX OF 09/22/2007  . History of Crohn's disease 09/22/2007    Past Surgical History:  Procedure Laterality Date  . APPENDECTOMY    . COLONOSCOPY    . POLYPECTOMY    . TWISTED TESTICLES  1970   [facility]   . UMBILICAL HERNIA REPAIR         Family History  Problem Relation Age of Onset  . Arthritis Mother   . Stroke Mother   . Depression Mother   . Hypertension Father   . Prostate cancer Father 16  . Cancer Maternal Aunt   . Prostate cancer Maternal Uncle   . Breast cancer Maternal Grandmother   . Cancer Maternal Grandfather   . Cancer Paternal Grandmother   . Prostate cancer Paternal Uncle   . Colon cancer Neg Hx   . Rectal cancer Neg Hx   . Stomach cancer Neg Hx   . Esophageal cancer Neg Hx   . Pancreatic cancer Neg Hx     Social History   Tobacco Use  . Smoking status:  Former Smoker    Quit date: 1980    Years since quitting: 42.2  . Smokeless tobacco: Never Used  Vaping Use  . Vaping Use: Never used  Substance Use Topics  . Alcohol use: No  . Drug use: No    Home Medications Prior to Admission medications   Medication Sig Start Date End Date Taking? Authorizing Provider  ALPRAZolam Duanne Moron) 0.5 MG tablet Take 1 tablet (0.5 mg total) by mouth daily as needed. 11/25/19   Cottle, Billey Co., MD  budesonide (ENTOCORT EC) 3 MG 24 hr capsule Take 3 capsules (9 mg total) by mouth daily. 3 mg tabs - take 9 mg daily. 01/25/20   Ladene Artist, MD  busPIRone (BUSPAR) 30 MG tablet Take 1 tablet (30 mg total) by mouth 2 (two) times daily.  11/25/19   Cottle, Billey Co., MD  donepezil (ARICEPT) 10 MG tablet TAKE 1 TABLET(10 MG) BY MOUTH AT BEDTIME 11/25/19   Cottle, Billey Co., MD  fluvoxaMINE (LUVOX) 100 MG tablet TAKE 1 TABLET BY MOUTH EVERY MORNING THEN TAKE 3 TABLETS BY MOUTH EVERY EVENING 11/25/19   Cottle, Billey Co., MD  lamoTRIgine (LAMICTAL) 200 MG tablet Take 1 tablet (200 mg total) by mouth daily. 11/25/19   Cottle, Billey Co., MD  levothyroxine (SYNTHROID) 75 MCG tablet TAKE 1 TABLET(75 MCG) BY MOUTH DAILY 09/25/19   Denita Lung, MD  lipase/protease/amylase (CREON) 36000 UNITS CPEP capsule Take 4  by mouth before each meal and two with each snack 11/03/19   Zehr, Janett Billow D, PA-C  lithium carbonate 150 MG capsule Take 1 capsule (150 mg total) by mouth daily. 11/25/19   Cottle, Billey Co., MD  losartan-hydrochlorothiazide [facility]) 100-12.5 MG tablet TAKE 1 TABLET BY MOUTH DAILY 12/08/19   Denita Lung, MD  Multiple Vitamin (MULTIVITAMIN WITH MINERALS) TABS Take 1 tablet by mouth daily.    [provider]  OLANZapine (ZYPREXA) 15 MG tablet TAKE 1 TABLET(15 MG) BY MOUTH AT BEDTIME 11/25/19   Cottle, Billey Co., MD  tamsulosin (FLOMAX) 0.4 MG CAPS capsule Take 0.8 mg by mouth daily. 10/21/19   [provider]    Allergies    Erythromycin, Penicillins, and Tetracyclines & related  Review of Systems   Review of Systems  Constitutional: Positive for chills. Negative for appetite change and fever.  HENT: Positive for congestion and sore throat (resolved). Negative for drooling, ear discharge, ear pain, sinus pressure and sinus pain.   Eyes: Negative for visual disturbance.  Respiratory: Negative for cough, shortness of breath and wheezing.   Cardiovascular: Negative for chest pain and palpitations.  Gastrointestinal: Positive for abdominal pain and diarrhea. Negative for constipation, nausea and vomiting.  Genitourinary: Negative for dysuria, flank pain, hematuria and urgency.  Musculoskeletal:  Negative for back pain, myalgias, neck pain and neck stiffness.  Skin: Negative for rash.  Allergic/Immunologic: Negative for immunocompromised state.  Neurological: Negative for seizures, syncope, weakness, numbness and headaches.  Psychiatric/Behavioral: Negative for confusion.    Physical Exam Updated Vital Signs BP (!) 153/100 (BP Location: Right Arm)   Pulse 70   Temp 97.7 F (36.5 C) (Oral)   Resp 16   SpO2 98%   Physical Exam Vitals and nursing note reviewed.  Constitutional:      General: He is not in acute distress.    Appearance: He is well-developed. He is not ill-appearing, toxic-appearing or diaphoretic.     Comments: Well-appearing.  No acute distress.  HENT:  Head: Normocephalic.     Right Ear: Tympanic membrane, ear canal and external ear normal.     Left Ear: Tympanic membrane, ear canal and external ear normal.     Nose: Congestion present. No rhinorrhea.     Mouth/Throat:     Pharynx: No oropharyngeal exudate or posterior oropharyngeal erythema.     Comments: Congested phonation Eyes:     General: No scleral icterus.    Conjunctiva/sclera: Conjunctivae normal.  Cardiovascular:     Rate and Rhythm: Normal rate and regular rhythm.     Pulses: Normal pulses.     Heart sounds: Normal heart sounds. No murmur heard. No friction rub. No gallop.   Pulmonary:     Effort: Pulmonary effort is normal. No respiratory distress.     Breath sounds: No stridor. Wheezing present. No rhonchi or rales.     Comments: End expiratory wheezes noted in the bilateral bases.  No rhonchi or rales.  Patient has a frequent, nonproductive cough noted with deep inspiration.  Able to speak in complete, fluent sentences without increased work of breathing.  Abdominal:     General: There is no distension.     Palpations: Abdomen is soft. There is no mass.     Tenderness: There is abdominal tenderness. There is no right CVA tenderness, left CVA tenderness, guarding or rebound.      Hernia: No hernia is present.     Comments: Minimal tenderness to palpation in the epigastric region.  No rebound or guarding.  Negative Murphy sign.  No CVA tenderness bilaterally.  Normoactive bowel sounds.  Abdomen is soft and nondistended.  Musculoskeletal:     Cervical back: Neck supple.     Right lower leg: No edema.     Left lower leg: No edema.  Skin:    General: Skin is warm and dry.  Neurological:     Mental Status: He is alert.     Sensory: Sensory deficit:   Psychiatric:        Behavior: Behavior normal.     ED Results / Procedures / Treatments   Labs (all labs ordered are listed, but only abnormal results are displayed) Labs Reviewed  BASIC METABOLIC PANEL - Abnormal; Notable for the following components:      Result Value   Sodium 129 (*)    Chloride 93 (*)    All other components within normal limits  CBC  TROPONIN I (HIGH SENSITIVITY)  TROPONIN I (HIGH SENSITIVITY)    EKG None  Radiology DG Chest 2 View  Result Date: 03/04/2020 CLINICAL DATA:  Cough EXAM: CHEST - 2 VIEW COMPARISON:  Chest x-ray 03/04/2020 6:19 p.m. FINDINGS: The heart size and mediastinal contours are within normal limits. Left lung base linear atelectasis/scarring again noted. No focal consolidation. No pulmonary edema. No pleural effusion. No pneumothorax. No acute osseous abnormality. IMPRESSION: No active cardiopulmonary disease. Electronically Signed   By: Iven Finn M.D.   On: 03/04/2020 20:21   DG Chest 2 View  Result Date: 03/04/2020 CLINICAL DATA:  Productive cough for 1 week. EXAM: CHEST - 2 VIEW COMPARISON:  Single-view of the chest 09/22/2009. PA and lateral chest 09/21/2009. FINDINGS: Lungs clear. Heart size normal. No pneumothorax or pleural fluid. No acute or focal bony abnormality. IMPRESSION: No acute disease. Electronically Signed   By: Inge Rise M.D.   On: 03/04/2020 18:29    Procedures Procedures   Medications Ordered in ED Medications - No data to  display  ED Course  I  have reviewed the triage vital signs and the nursing notes.  Pertinent labs & imaging results that were available during my care of the patient were reviewed by me and considered in my medical decision making (see chart for details).    MDM Rules/Calculators/A&P                          60 year old male with a history of Crohn's disease, HLD, HTN who presents the emergency department from urgent care with a 1 week history of sore throat that is since resolved, cough, nasal congestion, chills, diarrhea, and epigastric pain.  Vital signs are stable.  Patient is nontoxic-appearing.  Physical exam is reassuring.  Labs and imaging have been reviewed and independently interpreted by me.  Urgent care sent in for evaluation for PE versus CHF exacerbation.  Chest x-ray is unremarkable.  He does have an expiratory wheezes noted on exam.  Offered breathing treatment or albuterol inhaler, but patient declined.  He adamantly denies that he has no short of breath.  He is PERC negative.  He has no evidence of volume overload on exam.  Labs with mild hyponatremia.  But this is unchanged from labs in April 2020.  Did consider this to be secondary to alcohol use, but patient is abstinent from alcohol.  He has no symptoms of hyponatremia.  Since this is mild hyponatremia, will have the patient follow-up with primary care for reevaluation.  Labs are otherwise reassuring.  He has a pending COVID-19 test.  Symptoms are most consistent with viral illness.  Recommended symptomatic home management with over-the-counter medications.  He will follow-up with his PCP for recheck of his sodium levels.  ER return precautions given.  He is hemodynamically stable no acute distress.  Safe for discharge home with outpatient follow-up as indicated.  Final Clinical Impression(s) / ED Diagnoses Final diagnoses:  Viral URI with cough  Hyponatremia    Rx / DC Orders ED Discharge Orders    None        Taylormarie Register A, PA-C 03/05/20 0000    Mesner, Corene Cornea, MD 03/05/20 [phone]

## 2020-03-04 NOTE — ED Triage Notes (Signed)
Pt reports that he is having congestion, cough, epigastric pain with coughing. Denies fever or SOB. Started 1 week ago.

## 2020-03-04 NOTE — ED Notes (Signed)
Patient is being discharged from the Urgent Care and sent to the Emergency Department via POV . Per Nelwyn Salisbury PA, patient is in need of higher level of care due to SOB and Chest Pain. Patient is aware and verbalizes understanding of plan of care.  Vitals:   03/04/20 1735  BP: 127/85  Pulse: 90  Resp: 17  Temp: 99.1 F (37.3 C)  SpO2: 97%

## 2020-03-04 NOTE — ED Provider Notes (Signed)
[facility]    CSN: [id number] Arrival date & time: 03/04/20  1723      History   Chief Complaint Chief Complaint  Patient presents with  . Cough    HPI Zachary [name] is a 60 y.o. male.   HPI  Cough: Patient reports that for the past week he has had productive cough, chest pain with coughing and nasal congestion.Has had a few chest pain episodes at rest over the past few days but none today.  He also has had a headache and sore throat.  He states that he has had Covid testing which was negative.  No known sick contacts.  No current tobacco use but did smoke for about 1 month in the seventies.  He has not been taking anything for his symptoms.  No significant shortness of breath, no chest pain at rest and no calf pain.  No hemoptysis.  Past Medical History:  Diagnosis Date  . Allergy   . Anxiety   . Crohn disease ([place])    pt reports subsequent testing was normal  . Depression   . GERD (gastroesophageal reflux disease)   . Herpes simplex   . Hyperlipidemia   . Hypertension   . IBS (irritable bowel syndrome)   . Incontinence   . Irregular heart beat     Patient Active Problem List   Diagnosis Date Noted  . Incontinence of feces 11/03/2019  . Right sided abdominal pain 11/03/2019  . OAB (overactive bladder) 10/26/2019  . Pancreatic insufficiency 10/26/2019  . Spinal stenosis of lumbar region 03/03/2018  . Essential hypertension 01/27/2018  . OCD (obsessive compulsive disorder) 10/10/2017  . Social anxiety disorder 10/10/2017  . Seasonal allergies 06/26/2010  . History of herpes labialis 06/26/2010  . FLATULENCE ERUCTATION AND GAS PAIN 09/23/2007  . Major depression 09/22/2007  . CROHN'S DISEASE, LARGE AND SMALL INTESTINES 09/22/2007  . COLONIC POLYPS, HYPERPLASTIC, HX OF 09/22/2007  . History of Crohn's disease 09/22/2007    Past Surgical History:  Procedure Laterality Date  . APPENDECTOMY    . COLONOSCOPY    . POLYPECTOMY    . TWISTED  TESTICLES  1970   [facility]   . UMBILICAL HERNIA REPAIR         Home Medications    Prior to Admission medications   Medication Sig Start Date End Date Taking? Authorizing Provider  ALPRAZolam Duanne Moron) 0.5 MG tablet Take 1 tablet (0.5 mg total) by mouth daily as needed. 11/25/19   Cottle, Billey Co., MD  aspirin 81 MG chewable tablet Asprin Ec Low Dose 81 mg tablet,delayed release  Take 1 tablet every day by oral route. Patient not taking: Reported on 02/29/2020    [provider]  budesonide (ENTOCORT EC) 3 MG 24 hr capsule Take 3 capsules (9 mg total) by mouth daily. 3 mg tabs - take 9 mg daily. 01/25/20   Ladene Artist, MD  busPIRone (BUSPAR) 30 MG tablet Take 1 tablet (30 mg total) by mouth 2 (two) times daily. 11/25/19   Cottle, Billey Co., MD  donepezil (ARICEPT) 10 MG tablet TAKE 1 TABLET(10 MG) BY MOUTH AT BEDTIME 11/25/19   Cottle, Billey Co., MD  fluvoxaMINE (LUVOX) 100 MG tablet TAKE 1 TABLET BY MOUTH EVERY MORNING THEN TAKE 3 TABLETS BY MOUTH EVERY EVENING 11/25/19   Cottle, Billey Co., MD  lamoTRIgine (LAMICTAL) 200 MG tablet Take 1 tablet (200 mg total) by mouth daily. 11/25/19   Cottle, Billey Co., MD  levothyroxine (SYNTHROID) 75 MCG tablet TAKE 1 TABLET(75 MCG) BY MOUTH DAILY 09/25/19   Denita Lung, MD  lipase/protease/amylase (CREON) [phone] UNITS CPEP capsule Take 4  by mouth before each meal and two with each snack 11/03/19   Zehr, Janett Billow D, PA-C  lithium carbonate 150 MG capsule Take 1 capsule (150 mg total) by mouth daily. 11/25/19   Cottle, Billey Co., MD  losartan-hydrochlorothiazide [facility]) 100-12.5 MG tablet TAKE 1 TABLET BY MOUTH DAILY 12/08/19   Denita Lung, MD  Multiple Vitamin (MULTIVITAMIN WITH MINERALS) TABS Take 1 tablet by mouth daily.    [provider]  OLANZapine (ZYPREXA) 15 MG tablet TAKE 1 TABLET(15 MG) BY MOUTH AT BEDTIME 11/25/19   Cottle, Billey Co., MD  tamsulosin (FLOMAX) 0.4 MG CAPS capsule Take 0.8 mg by mouth  daily. 10/21/19   [provider]    Family History Family History  Problem Relation Age of Onset  . Arthritis Mother   . Stroke Mother   . Depression Mother   . Hypertension Father   . Prostate cancer Father 70  . Cancer Maternal Aunt   . Prostate cancer Maternal Uncle   . Breast cancer Maternal Grandmother   . Cancer Maternal Grandfather   . Cancer Paternal Grandmother   . Prostate cancer Paternal Uncle   . Colon cancer Neg Hx   . Rectal cancer Neg Hx   . Stomach cancer Neg Hx   . Esophageal cancer Neg Hx   . Pancreatic cancer Neg Hx     Social History Social History   Tobacco Use  . Smoking status: Former Smoker    Quit date: 1980    Years since quitting: 42.2  . Smokeless tobacco: Never Used  Vaping Use  . Vaping Use: Never used  Substance Use Topics  . Alcohol use: No  . Drug use: No     Allergies   Erythromycin, Penicillins, and Tetracyclines & related   Review of Systems Review of Systems  As stated above in HPI Physical Exam Triage Vital Signs ED Triage Vitals  Enc Vitals Group     BP 03/04/20 1735 127/85     Pulse Rate 03/04/20 1735 90     Resp 03/04/20 1735 17     Temp 03/04/20 1735 99.1 F (37.3 C)     Temp Source 03/04/20 1735 Oral     SpO2 03/04/20 1735 97 %     Weight --      Height --      Head Circumference --      Peak Flow --      Pain Score 03/04/20 1737 3     Pain Loc --      Pain Edu? --      Excl. in [place]? --    No data found.  Updated Vital Signs BP 127/85 (BP Location: Left Arm)   Pulse 90   Temp 99.1 F (37.3 C) (Oral)   Resp 17   SpO2 97%   Physical Exam Vitals and nursing note reviewed.  Constitutional:      General: He is not in acute distress.    Appearance: Normal appearance. He is not ill-appearing, toxic-appearing or diaphoretic.     Comments: Pt sounds as if he has nasal congestion  HENT:     Head: Normocephalic and atraumatic.     Right Ear: Tympanic membrane, ear canal and external ear  normal.     Left Ear: Tympanic membrane, ear canal and external ear  normal.     Nose: Congestion and rhinorrhea present.     Mouth/Throat:     Mouth: Mucous membranes are moist.     Pharynx: No oropharyngeal exudate or posterior oropharyngeal erythema.  Eyes:     Extraocular Movements: Extraocular movements intact.     Pupils: Pupils are equal, round, and reactive to light.  Cardiovascular:     Rate and Rhythm: Normal rate and regular rhythm.     Heart sounds: Normal heart sounds.  Pulmonary:     Effort: Pulmonary effort is normal. No respiratory distress.     Breath sounds: No stridor. Rhonchi (throughout) present. No wheezing or rales.  Chest:     Chest wall: No tenderness.  Musculoskeletal:     Cervical back: Normal range of motion and neck supple.     Right lower leg: No edema.     Left lower leg: No edema.  Lymphadenopathy:     Cervical: No cervical adenopathy.  Neurological:     Mental Status: He is alert and oriented to person, place, and time.  Psychiatric:        Mood and Affect: Mood normal.        Behavior: Behavior normal.        Thought Content: Thought content normal.        Judgment: Judgment normal.      UC Treatments / Results  Labs (all labs ordered are listed, but only abnormal results are displayed) Labs Reviewed - No data to display  EKG   Radiology No results found.  Procedures Procedures (including critical care time)  Medications Ordered in UC Medications - No data to display  Initial Impression / Assessment and Plan / UC Course  I have reviewed the triage vital signs and the nursing notes.  Pertinent labs & imaging results that were available during my care of the patient were reviewed by me and considered in my medical decision making (see chart for details).     New. X ray pending.   UPDATE: Patient's repeat EKG is unremarkable and his chest x-ray shows no sign of pneumonia.  I am concerned about his history, physical exam  findings and vitals.  He appears stable but I do want him assessed for items such as a potential pulmonary embolism versus potential CHF.  I have recommended that he go to the hospital for further evaluation to which she is agreeable.  He wishes to transport himself which I am agreeable with given that he appears stable.  Final Clinical Impressions(s) / UC Diagnoses   Final diagnoses:  None   Discharge Instructions   None    ED Prescriptions    None     PDMP not reviewed this encounter.   Hughie Closs, [place] 03/04/20 1912

## 2020-03-04 NOTE — ED Triage Notes (Signed)
Pt presents with productive cough that causes generalized chest discomfort when he coughs and congestion X 1 week .

## 2020-03-04 NOTE — ED Notes (Signed)
Pt in a hurry to leave he did not want to wait for his discharge instructions or vitals

## 2020-03-05 LAB — SARS CORONAVIRUS 2 (TAT 6-24 HRS): SARS Coronavirus 2: NEGATIVE

## 2020-03-22 ENCOUNTER — Encounter: Payer: Self-pay | Admitting: Psychiatry

## 2020-03-22 ENCOUNTER — Other Ambulatory Visit: Payer: Self-pay

## 2020-03-22 ENCOUNTER — Ambulatory Visit (INDEPENDENT_AMBULATORY_CARE_PROVIDER_SITE_OTHER): Payer: 59 | Admitting: Psychiatry

## 2020-03-22 DIAGNOSIS — F422 Mixed obsessional thoughts and acts: Secondary | ICD-10-CM | POA: Diagnosis not present

## 2020-03-22 DIAGNOSIS — F4010 Social phobia, unspecified: Secondary | ICD-10-CM

## 2020-03-22 DIAGNOSIS — F3341 Major depressive disorder, recurrent, in partial remission: Secondary | ICD-10-CM | POA: Diagnosis not present

## 2020-03-22 DIAGNOSIS — F423 Hoarding disorder: Secondary | ICD-10-CM

## 2020-03-22 DIAGNOSIS — G3184 Mild cognitive impairment, so stated: Secondary | ICD-10-CM

## 2020-03-22 MED ORDER — OLANZAPINE 15 MG PO TABS: ORAL_TABLET | ORAL | 0 refills | Status: DC

## 2020-03-22 MED ORDER — DONEPEZIL HCL 10 MG PO TABS: ORAL_TABLET | ORAL | 1 refills | Status: DC

## 2020-03-22 MED ORDER — ALPRAZOLAM 0.5 MG PO TABS
0.5000 mg | ORAL_TABLET | Freq: Every day | ORAL | 2 refills | Status: DC | PRN
Start: 2020-03-22 — End: 2021-07-03

## 2020-03-22 MED ORDER — FLUVOXAMINE MALEATE 100 MG PO TABS: ORAL_TABLET | ORAL | 1 refills | Status: DC

## 2020-03-22 MED ORDER — LAMOTRIGINE 200 MG PO TABS: 200.0000 mg | ORAL_TABLET | Freq: Every day | ORAL | 1 refills | Status: DC

## 2020-03-22 MED ORDER — BUSPIRONE HCL 30 MG PO TABS: 30.0000 mg | ORAL_TABLET | Freq: Two times a day (BID) | ORAL | 1 refills | Status: DC

## 2020-03-22 NOTE — Progress Notes (Signed)
Zachary [name] [id number] 11-19-1960 60 y.o.  Subjective:   Patient ID:  Zachary [name] is a 60 y.o. (DOB 03-Jan-1960) male.  Chief Complaint:  Chief Complaint  Patient presents with  . Follow-up  . Recurrent major depression in partial remission ([place])  . Memory Loss    Depression        Associated symptoms include no decreased concentration and no suicidal ideas.  Past medical history includes anxiety.   Anxiety Symptoms include nervous/anxious behavior. Patient reports no confusion, decreased concentration, dizziness or suicidal ideas.     Zachary [name] presents to the office today for follow-up of depression, obsessive anxiety and paranoia about body odor and getting dementia.  Did feel somewhat reassured about the fact that Dr. Marcos Eke said he does not have Alzheimer's dx.  visit October 16, 2018.  Initiated a trial of naltrexone off label 50 mg daily for impulse control purchasing.  01/16/2019 appointment with the following noted No benefit naltrexone.  Doesn't like his job but fears change in jobs bc "of the farting" and doesn't feel he'd be accepted in other places.  In this job for 16 years without a promotion.  There have bbeen layoffs and he hasn't gotten layoffs.   The same overall with mental health issues.  Nice new boss.   Worries over his thoughts of patient.  Mood stable.   Continued concerns over spending on things he doesn't need or even really want.  Books he'll never read or DVDs he'll never watch. Huge problem buying too much off the Internet.  Blew check from incentive on impulse purchase from the Internet.  Books and collectibles per usual.  Running up debt.  No other impulsivity.   Not hyper nor otherwise manic.    No history of bankruptcy.  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  Maybe out of boredom.  Needs to stop and can't control himself.Has not talked to anyone about it.  Doesn't get anxious around the spending.  Not depressed.  Busy at work.   Sleep fine.  Sometimes anxious about passing gas at work and not otherwise anxious there.  Rarely used Xanax.   Depression has remained under control. Overall he thinks the depression and anxiety levels have been "fine".  Friends notice his comprehension problems. Not sure he trusts the neuropsychological testing which was normal and did not show dementia as he fears.  Plan: No meds changed.  07/23/2019 appointment with the following noted: Concerns over concentration and memory.  Thinks he's having trouble with word-finding.  Bosses satisfied with work response and function. Plan: Check B12 and folated DT cognitive complaints. Trial reduction in olanzapine to see if cognition is better to 15 mg daily.  11/25/2019 appointment with the following noted: Thinks he had a lot of olanzapine 20 and kept taking it and never reduced it yet. No mental health changes since here.  Work very busy.  Regular anxiety thing but no depression.  Obsesses over farting and bothering people. Plan: Trial reduction in olanzapine to see if cognition is better to 15 mg daily.  03/22/2020 appointment with following noted: Might be a little better with cognition with the reduction in olanzapine.  No worse anxiety, fear, depression.  Worry is not worse.  Still sleeps well.   Overall things are fine in his mental health and function.  Still working.  Tired of the job and considering job at [employer].  Got confused by the online app and dropped it.  Work pretty busy.  Employee situation stable.    Patient denies difficulty with sleep initiation or maintenance over 8 hours. Denies appetite disturbance.  Patient reports that energy and motivation have been good.  Patient denies any difficulty with concentration.  Patient denies any suicidal ideation.  Good interest and enjoyment.  F has unknown cancer in chemo in early 74's.   Talk every other day.   Past psych med: Abilify, Zyprexa, Seroquel 600, olanzapine 30 Wellbutrin,  nortriptyline, clomipramine, mirtazapine,  fluvoxamine, Lamotrigine, lithium no response,  stimulants, Aricept  buspirone 60,   Xanax Naltrexone for compulsive buying NR  Review of Systems:  Review of Systems  Respiratory: Positive for cough.   Gastrointestinal: Negative for abdominal distention.  Neurological: Negative for dizziness, tremors and weakness.  Psychiatric/Behavioral: Positive for depression. Negative for agitation, behavioral problems, confusion, decreased concentration, dysphoric mood, hallucinations, self-injury, sleep disturbance and suicidal ideas. The patient is nervous/anxious. The patient is not hyperactive.     Medications: I have reviewed the patient's current medications.  Current Outpatient Medications  Medication Sig Dispense Refill  . budesonide (ENTOCORT EC) 3 MG 24 hr capsule Take 3 capsules (9 mg total) by mouth daily. 3 mg tabs - take 9 mg daily. 90 capsule 2  . levothyroxine (SYNTHROID) 75 MCG tablet TAKE 1 TABLET(75 MCG) BY MOUTH DAILY 90 tablet 3  . lipase/protease/amylase (CREON) 36000 UNITS CPEP capsule Take 4  by mouth before each meal and two with each snack 1440 capsule 3  . lithium carbonate 150 MG capsule Take 1 capsule (150 mg total) by mouth daily. 90 capsule 3  . losartan-hydrochlorothiazide (HYZAAR) 100-12.5 MG tablet TAKE 1 TABLET BY MOUTH DAILY 30 tablet 1  . Multiple Vitamin (MULTIVITAMIN WITH MINERALS) TABS Take 1 tablet by mouth daily.    . tamsulosin (FLOMAX) 0.4 MG CAPS capsule Take 0.8 mg by mouth daily.    Marland Kitchen ALPRAZolam (XANAX) 0.5 MG tablet Take 1 tablet (0.5 mg total) by mouth daily as needed. 30 tablet 2  . busPIRone (BUSPAR) 30 MG tablet Take 1 tablet (30 mg total) by mouth 2 (two) times daily. 180 tablet 1  . donepezil (ARICEPT) 10 MG tablet TAKE 1 TABLET(10 MG) BY MOUTH AT BEDTIME 90 tablet 1  . fluvoxaMINE (LUVOX) 100 MG tablet TAKE 1 TABLET BY MOUTH EVERY MORNING THEN TAKE 3 TABLETS BY MOUTH EVERY EVENING 360 tablet 1  .  lamoTRIgine (LAMICTAL) 200 MG tablet Take 1 tablet (200 mg total) by mouth daily. 90 tablet 1  . OLANZapine (ZYPREXA) 15 MG tablet TAKE 1 TABLET(15 MG) BY MOUTH AT BEDTIME 90 tablet 0   No current facility-administered medications for this visit.    Medication Side Effects: None  Allergies:  Allergies  Allergen Reactions  . Erythromycin     Pt unsure, reports he recently received a -mycin drug with no reaction.   Marland Kitchen Penicillins     REACTION: "it may kill me"  . Tetracyclines & Related Hives    Past Medical History:  Diagnosis Date  . Allergy   . Anxiety   . Crohn disease ([place])    pt reports subsequent testing was normal  . Depression   . GERD (gastroesophageal reflux disease)   . Herpes simplex   . Hyperlipidemia   . Hypertension   . IBS (irritable bowel syndrome)   . Incontinence   . Irregular heart beat     Family History  Problem Relation Age of Onset  . Arthritis Mother   . Stroke Mother   . Depression Mother   .  Hypertension Father   . Prostate cancer Father 9  . Cancer Maternal Aunt   . Prostate cancer Maternal Uncle   . Breast cancer Maternal Grandmother   . Cancer Maternal Grandfather   . Cancer Paternal Grandmother   . Prostate cancer Paternal Uncle   . Colon cancer Neg Hx   . Rectal cancer Neg Hx   . Stomach cancer Neg Hx   . Esophageal cancer Neg Hx   . Pancreatic cancer Neg Hx     Social History   Socioeconomic History  . Marital status: Single    Spouse name: Not on file  . Number of children: Not on file  . Years of education: Not on file  . Highest education level: Not on file  Occupational History  . Not on file  Tobacco Use  . Smoking status: Former Smoker    Quit date: 1980    Years since quitting: 42.2  . Smokeless tobacco: Never Used  Vaping Use  . Vaping Use: Never used  Substance and Sexual Activity  . Alcohol use: No  . Drug use: No  . Sexual activity: Not Currently  Other Topics Concern  . Not on file  Social History  Narrative  . Not on file   Social Determinants of Health   Financial Resource Strain: Not on file  Food Insecurity: Not on file  Transportation Needs: Not on file  Physical Activity: Not on file  Stress: Not on file  Social Connections: Not on file  Intimate Partner Violence: Not on file    Past Medical History, Surgical history, Social history, and Family history were reviewed and updated as appropriate.   Please see review of systems for further details on the patient's review from today.   Objective:   Physical Exam:  There were no vitals taken for this visit.  Physical Exam Constitutional:      General: He is not in acute distress.    Appearance: He is well-developed.  Musculoskeletal:        General: No deformity.  Neurological:     Mental Status: He is alert and oriented to person, place, and time.     Coordination: Coordination normal.  Psychiatric:        Attention and Perception: Attention normal. He is attentive.        Mood and Affect: Mood is anxious. Mood is not depressed. Affect is blunt. Affect is not labile, angry or inappropriate.        Speech: Speech is not rapid and pressured, delayed or slurred.        Behavior: Behavior normal. Behavior is not agitated or hyperactive.        Thought Content: Thought content is paranoid. Thought content does not include homicidal or suicidal ideation. Thought content does not include homicidal or suicidal plan.        Cognition and Memory: Cognition normal.        Judgment: Judgment is inappropriate.     Comments: Insight is fair. Chronic obs/paranoia about passing gas at work is borderline delusional but not apparently worse with less olanzapine. [employer] spending collectibles. Chronic hypoverbal     Lab Review:     Component Value Date/Time   NA 129 (L) 03/04/2020 1941   NA 139 09/11/2018 1120   K 3.9 03/04/2020 1941   CL 93 (L) 03/04/2020 1941   CO2 26 03/04/2020 1941   GLUCOSE 87 03/04/2020 1941   BUN  13 03/04/2020 1941   BUN 7 09/11/2018 1120  CREATININE 1.11 03/04/2020 1941   CREATININE 0.91 07/30/2013 0913   CALCIUM 9.1 03/04/2020 1941   PROT 7.6 04/24/2019 1053   PROT 7.2 09/11/2018 1120   ALBUMIN 4.5 04/24/2019 1053   ALBUMIN 4.6 09/11/2018 1120   AST 25 04/24/2019 1053   ALT 32 04/24/2019 1053   ALKPHOS 71 04/24/2019 1053   BILITOT 0.4 04/24/2019 1053   BILITOT 0.3 09/11/2018 1120   GFRNONAA >60 03/04/2020 1941   GFRAA 90 09/11/2018 1120       Component Value Date/Time   WBC 9.2 03/04/2020 1941   RBC 4.26 03/04/2020 1941   HGB 13.7 03/04/2020 1941   HGB 14.8 09/11/2018 1120   HCT 39.0 03/04/2020 1941   HCT 43.3 09/11/2018 1120   PLT 318 03/04/2020 1941   PLT 363 09/11/2018 1120   MCV 91.5 03/04/2020 1941   MCV 92 09/11/2018 1120   MCH 32.2 03/04/2020 1941   MCHC 35.1 03/04/2020 1941   RDW 13.0 03/04/2020 1941   RDW 13.0 09/11/2018 1120   LYMPHSABS 1.9 04/24/2019 1053   LYMPHSABS 2.1 09/11/2018 1120   MONOABS 1.0 04/24/2019 1053   EOSABS 0.3 04/24/2019 1053   EOSABS 0.3 09/11/2018 1120   BASOSABS 0.1 04/24/2019 1053   BASOSABS 0.1 09/11/2018 1120    Lithium Lvl  Date Value Ref Range Status  04/18/2018 <0.3 (L) 0.6 - 1.2 mmol/L Final    Comment:    Verified by repeat analysis. Marland Kitchen      10/26/2019 B12 [id number]  No results found for: PHENYTOIN, PHENOBARB, VALPROATE, CBMZ   .res Assessment: Plan:    Tige was seen today for follow-up, recurrent major depression in partial remission ([facility]) and memory loss.  Diagnoses and all orders for this visit:  Recurrent major depression in partial remission ([place]) -     lamoTRIgine (LAMICTAL) 200 MG tablet; Take 1 tablet (200 mg total) by mouth daily. -     OLANZapine (ZYPREXA) 15 MG tablet; TAKE 1 TABLET(15 MG) BY MOUTH AT BEDTIME  Hoarding disorder -     fluvoxaMINE (LUVOX) 100 MG tablet; TAKE 1 TABLET BY MOUTH EVERY MORNING THEN TAKE 3 TABLETS BY MOUTH EVERY EVENING  Mixed obsessional thoughts and acts -      busPIRone (BUSPAR) 30 MG tablet; Take 1 tablet (30 mg total) by mouth 2 (two) times daily. -     fluvoxaMINE (LUVOX) 100 MG tablet; TAKE 1 TABLET BY MOUTH EVERY MORNING THEN TAKE 3 TABLETS BY MOUTH EVERY EVENING -     OLANZapine (ZYPREXA) 15 MG tablet; TAKE 1 TABLET(15 MG) BY MOUTH AT BEDTIME  Social anxiety disorder -     ALPRAZolam (XANAX) 0.5 MG tablet; Take 1 tablet (0.5 mg total) by mouth daily as needed. -     busPIRone (BUSPAR) 30 MG tablet; Take 1 tablet (30 mg total) by mouth 2 (two) times daily. -     fluvoxaMINE (LUVOX) 100 MG tablet; TAKE 1 TABLET BY MOUTH EVERY MORNING THEN TAKE 3 TABLETS BY MOUTH EVERY EVENING -     OLANZapine (ZYPREXA) 15 MG tablet; TAKE 1 TABLET(15 MG) BY MOUTH AT BEDTIME  Mild cognitive impairment -     donepezil (ARICEPT) 10 MG tablet; TAKE 1 TABLET(10 MG) BY MOUTH AT BEDTIME   [name] has been diagnosed with treatment resistant depression with psychotic features, OCD and social anxiety but schizoaffective may be more appropriate diagnosis given this chronic paranoid thinking.  Specifically he is chronically  suspicious that he has dementia despite neuropsychological testing which tells  him he does not.  He also has chronic thoughts that other people find his odor offensive because of passing gas.  There is very little evidence to support this.  He has been under my psychiatric care for many many years and never owed noticed body odor and appointments at any time.  He also suspects that people are thinking negatively of him regularly.  Overall his paranoia and anxiety and depression are unchanged with reduction in olanzapine to 31m daily and he thinks maybe cognition is a little better.  We will therefore continue his current dosages.  Discussed the risk of serotonin syndrome with the writer than usual dose of fluvoxamine but again this appears to have been helpful for his anxiety and obsessive thinking.  Discussed potential metabolic side effects associated with  atypical antipsychotics, as well as potential risk for movement side effects. Advised pt to contact office if movement side effects occur.  No evidence of movement disorder. Still obsessing on farting and thinking others notice but it's never been noticeable in years of seeing him and he never gets complaints from other about it.  Discussed the risk of polypharmacy but again if appears medically necessary, helpful and well tolerated.  Trial reduction in olanzapine to see if cognition is better to 15 mg daily may be succcessful and nothing is worse so no change in dose..  No med changes today.  FU 4 mos.  CLynder Parents MD, DFAPA  Please see After Visit Summary for patient specific instructions.  Future Appointments  Date Time Provider DBartlett 04/28/2020  9:50 AM SLadene Artist MD [facility]    No orders of the defined types were placed in this encounter.     -------------------------------

## 2020-04-01 ENCOUNTER — Telehealth: Payer: Self-pay

## 2020-04-01 NOTE — Telephone Encounter (Signed)
LVM for pt to cal back to schedule a annual or med check with Dr. Redmond School. [place]

## 2020-04-26 ENCOUNTER — Ambulatory Visit: Payer: 59 | Admitting: Family Medicine

## 2020-04-26 VITALS — BP 100/68 | HR 49 | Temp 96.7°F | Ht 74.5 in | Wt 253.8 lb

## 2020-04-26 DIAGNOSIS — R5383 Other fatigue: Secondary | ICD-10-CM | POA: Diagnosis not present

## 2020-04-26 DIAGNOSIS — Z79899 Other long term (current) drug therapy: Secondary | ICD-10-CM

## 2020-04-26 DIAGNOSIS — E871 Hypo-osmolality and hyponatremia: Secondary | ICD-10-CM | POA: Diagnosis not present

## 2020-04-26 NOTE — Progress Notes (Signed)
   Subjective:    Patient ID: Zachary [name], male    DOB: 15-Nov-1960, 60 y.o.   MRN: [id number]  HPI He is here for consult concerning fatigue.  He was seen in the emergency room in March and noted to have a slightly low sodium at 129.  He has not gotten follow-up until now.  He continues to see his psychiatrist and is on medications listed in the chart.  He is also seeing GI and is taking Creon.  He still has some swelling.  He is taking a low-dose thyroid medication.  No skin or hair changes.  Bowel habits are unchanged.   Review of Systems     Objective:   Physical Exam Alert and in no distress. Tympanic membranes and canals are normal. Pharyngeal area is normal. Neck is supple without adenopathy or thyromegaly. Cardiac exam shows a regular sinus rhythm without murmurs or gallops. Lungs are clear to auscultation. DTRs are diminished.  Skin feels normal. Blood work from his [facility] visit was reviewed.      Assessment & Plan:  Fatigue, unspecified type - Plan: Comprehensive metabolic panel, TSH  Hyponatremia - Plan: Comprehensive metabolic panel  Encounter for long-term (current) use of medications - Plan: Comprehensive metabolic panel, TSH  I explained that fatigue can be from multiple sources.  We will recheck his sodium level as well his his thyroid and move on from there.

## 2020-04-27 LAB — COMPREHENSIVE METABOLIC PANEL
ALT: 39 IU/L (ref 0–44)
AST: 32 IU/L (ref 0–40)
Albumin/Globulin Ratio: 1.9 (ref 1.2–2.2)
Albumin: 4.4 g/dL (ref 3.8–4.9)
Alkaline Phosphatase: 63 IU/L (ref 44–121)
BUN/Creatinine Ratio: 9 (ref 9–20)
BUN: 11 mg/dL (ref 6–24)
Bilirubin Total: 0.3 mg/dL (ref 0.0–1.2)
CO2: 24 mmol/L (ref 20–29)
Calcium: 9.1 mg/dL (ref 8.7–10.2)
Chloride: 94 mmol/L — ABNORMAL LOW (ref 96–106)
Creatinine, Ser: 1.25 mg/dL (ref 0.76–1.27)
Globulin, Total: 2.3 g/dL (ref 1.5–4.5)
Glucose: 92 mg/dL (ref 65–99)
Potassium: 4 mmol/L (ref 3.5–5.2)
Sodium: 132 mmol/L — ABNORMAL LOW (ref 134–144)
Total Protein: 6.7 g/dL (ref 6.0–8.5)
eGFR: 67 mL/min/{1.73_m2} (ref >59)

## 2020-04-27 LAB — TSH: TSH: 1.82 u[IU]/mL (ref 0.450–4.500)

## 2020-04-28 ENCOUNTER — Ambulatory Visit: Payer: 59 | Admitting: Gastroenterology

## 2020-04-28 ENCOUNTER — Encounter: Payer: Self-pay | Admitting: Gastroenterology

## 2020-04-28 VITALS — BP 104/66 | HR 88 | Ht 73.0 in | Wt 252.2 lb

## 2020-04-28 DIAGNOSIS — K8689 Other specified diseases of pancreas: Secondary | ICD-10-CM | POA: Diagnosis not present

## 2020-04-28 DIAGNOSIS — K50818 Crohn's disease of both small and large intestine with other complication: Secondary | ICD-10-CM | POA: Diagnosis not present

## 2020-04-28 NOTE — Progress Notes (Signed)
    History of Present Illness: This is a 60 year old male returning for follow-up of diarrhea with occasional incontinence.  He has days where he has normal bowel movements and days where he has occasional urgent semiformed to loose bowel movements.  He relates 2 episodes of fecal incontinence with urgency since his last visit.  On further discussion about the use of Creon he states he states that he occasionally forgets to take Creon with meals.  Current Medications, Allergies, Past Medical History, Past Surgical History, Family History and Social History were reviewed in [employer] record.   Physical Exam: General: Well developed, well nourished, no acute distress Head: Normocephalic and atraumatic Eyes: Sclerae anicteric, EOMI Ears: Normal auditory acuity Mouth: Not examined, mask on during Covid-19 pandemic Lungs: Clear throughout to auscultation Heart: Regular rate and rhythm; no murmurs, rubs or bruits Abdomen: Soft, non tender and non distended. No masses, hepatosplenomegaly or hernias noted. Normal Bowel sounds Rectal: Not done Musculoskeletal: Symmetrical with no gross deformities  Pulses:  Normal pulses noted Extremities: No clubbing, cyanosis, edema or deformities noted Neurological: Alert oriented x 4. Query memory difficulties Psychological:  Alert and cooperative. Normal mood and flat affect   Assessment and Recommendations:  1.  Pancreatic insufficiency with intermittent urgent soft to loose diarrhea and occasional fecal incontinence.  He occasionally forgets to take Creon before a meal or snack.  He states he just does not remember sometimes.  We had a discussion about pancreatic insufficiency, the need for pancreatic enzyme replacement and the need to be take pancreatic enzyme replacement with every meal and every snack to help avoid diarrhea, urgency and incontinence.  REV in 6 weeks.   2.  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  Continue budesonide 9 mg daily.  Intermittent diarrhea appears to be due to noncompliance with pancreatic enzyme replacement. REV in 6 weeks.

## 2020-04-28 NOTE — Patient Instructions (Signed)
Take Creon each day with meals and with snacks. Do not skip doses.  Thank you for choosing me and [facility].  Pricilla Riffle. Dagoberto Ligas., MD., Marval Regal

## 2020-05-11 ENCOUNTER — Other Ambulatory Visit: Payer: Self-pay | Admitting: Gastroenterology

## 2020-05-11 DIAGNOSIS — R197 Diarrhea, unspecified: Secondary | ICD-10-CM

## 2020-05-11 DIAGNOSIS — K5080 Crohn's disease of both small and large intestine without complications: Secondary | ICD-10-CM

## 2020-05-27 ENCOUNTER — Ambulatory Visit: Payer: 59 | Admitting: Gastroenterology

## 2020-06-14 ENCOUNTER — Encounter: Payer: Self-pay | Admitting: Gastroenterology

## 2020-06-14 ENCOUNTER — Ambulatory Visit: Payer: 59 | Admitting: Gastroenterology

## 2020-06-14 VITALS — BP 112/70 | HR 70 | Ht 75.0 in | Wt 253.0 lb

## 2020-06-14 DIAGNOSIS — K5080 Crohn's disease of both small and large intestine without complications: Secondary | ICD-10-CM | POA: Diagnosis not present

## 2020-06-14 DIAGNOSIS — K8689 Other specified diseases of pancreas: Secondary | ICD-10-CM | POA: Diagnosis not present

## 2020-06-14 NOTE — Progress Notes (Signed)
    History of Present Illness: This is a 60 year old male returning for follow-up of diarrhea.  He has seen substantial improvement in his diarrhea taking Creon as prescribed.  He does note occasional episodes of diarrhea but no incontinence.  Current Medications, Allergies, Past Medical History, Past Surgical History, Family History and Social History were reviewed in [employer] record.   Physical Exam: General: Well developed, well nourished, no acute distress Head: Normocephalic and atraumatic Eyes: Sclerae anicteric, EOMI Ears: Normal auditory acuity Mouth: Not examined, mask on during Covid-19 pandemic Lungs: Clear throughout to auscultation Heart: Regular rate and rhythm; no murmurs, rubs or bruits Abdomen: Soft, non tender and non distended. No masses, hepatosplenomegaly or hernias noted. Normal Bowel sounds Rectal: Not done  Musculoskeletal: Symmetrical with no gross deformities  Pulses:  Normal pulses noted Extremities: No clubbing, cyanosis, edema or deformities noted Neurological: Alert oriented x 4, grossly nonfocal Psychological:  Alert and cooperative. Normal mood and affect   Assessment and Recommendations:  Pancreatic insufficiency.  Continue to follow fat modified diet.  Continue Creon 4 with meals and 2 with snacks.  He is asked to maintain a food and beverage diary to determine if certain food or beverage intolerances cause his intermittent diarrhea.  REV in 3 months.  2.  Crohn's ileocolitis with mildly active disease at the TI and IC valve.  Continue budesonide 9 mg daily for now.  Discussed long-term alternatives at his next visit. REV in 3 months.

## 2020-06-14 NOTE — Patient Instructions (Signed)
Normal BMI (Body Mass Index- based on height and weight) is between 19 and 25. Your BMI today is Body mass index is 31.62 kg/m. Zachary [name] Please consider follow up  regarding your BMI with your Primary Care Provider.  Thank you for choosing me and [facility].  Pricilla Riffle. Dagoberto Ligas., MD., Marval Regal

## 2020-06-27 ENCOUNTER — Telehealth: Payer: Self-pay

## 2020-06-27 DIAGNOSIS — Z209 Contact with and (suspected) exposure to unspecified communicable disease: Secondary | ICD-10-CM

## 2020-06-27 NOTE — Telephone Encounter (Signed)
Pt called and states he would like to be tested for HIV, gonorrhea & syphilis.  States doesn't have any symptoms, just wants to be proactive.  Said doesn't need an appointment unless he has to.  Is this ok or does he need an appointment with you?

## 2020-06-27 NOTE — Telephone Encounter (Signed)
Pt states can leave him a detailed message

## 2020-06-28 ENCOUNTER — Other Ambulatory Visit: Payer: Self-pay | Admitting: Psychiatry

## 2020-06-28 ENCOUNTER — Other Ambulatory Visit: Payer: Self-pay | Admitting: Family Medicine

## 2020-06-28 DIAGNOSIS — F3341 Major depressive disorder, recurrent, in partial remission: Secondary | ICD-10-CM

## 2020-06-28 DIAGNOSIS — F422 Mixed obsessional thoughts and acts: Secondary | ICD-10-CM

## 2020-06-28 DIAGNOSIS — I10 Essential (primary) hypertension: Secondary | ICD-10-CM

## 2020-06-28 DIAGNOSIS — F4010 Social phobia, unspecified: Secondary | ICD-10-CM

## 2020-06-28 NOTE — Telephone Encounter (Signed)
Mailed pt letter to advise of the need for an appointment. [place]

## 2020-06-29 NOTE — Telephone Encounter (Signed)
Called pt and scheduled

## 2020-06-30 ENCOUNTER — Other Ambulatory Visit: Payer: Self-pay | Admitting: Family Medicine

## 2020-06-30 ENCOUNTER — Other Ambulatory Visit: Payer: 59

## 2020-06-30 ENCOUNTER — Other Ambulatory Visit: Payer: Self-pay

## 2020-06-30 DIAGNOSIS — I10 Essential (primary) hypertension: Secondary | ICD-10-CM

## 2020-06-30 DIAGNOSIS — Z209 Contact with and (suspected) exposure to unspecified communicable disease: Secondary | ICD-10-CM

## 2020-07-03 LAB — RPR+HIV+GC+CT PANEL
Chlamydia trachomatis, NAA: NEGATIVE
HIV Screen 4th Generation wRfx: NONREACTIVE
Neisseria Gonorrhoeae by PCR: NEGATIVE
RPR Ser Ql: NONREACTIVE

## 2020-08-22 ENCOUNTER — Other Ambulatory Visit: Payer: Self-pay | Admitting: Gastroenterology

## 2020-08-22 DIAGNOSIS — R197 Diarrhea, unspecified: Secondary | ICD-10-CM

## 2020-08-22 DIAGNOSIS — K5080 Crohn's disease of both small and large intestine without complications: Secondary | ICD-10-CM

## 2020-08-26 ENCOUNTER — Telehealth: Payer: Self-pay | Admitting: Psychiatry

## 2020-08-26 NOTE — Telephone Encounter (Signed)
LVM to return call.

## 2020-08-26 NOTE — Telephone Encounter (Signed)
Pt called.  Complaint of problems with concentration and making mistakes at work.  Inquiring about medication regimen.  Pls advise.

## 2020-08-29 ENCOUNTER — Other Ambulatory Visit: Payer: Self-pay | Admitting: Psychiatry

## 2020-08-29 NOTE — Telephone Encounter (Signed)
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

## 2020-08-29 NOTE — Telephone Encounter (Signed)
He used to take stimulants, the last was Ritalin for focus.  I could send it in again if he wants.  He's been off for awhile.  It might work better now than last he took it.

## 2020-08-29 NOTE — Telephone Encounter (Signed)
Pt did not answer,LVM

## 2020-08-29 NOTE — Telephone Encounter (Signed)
Pt returned your call Please call him at [phone]

## 2020-08-30 ENCOUNTER — Encounter: Payer: Self-pay | Admitting: Medical

## 2020-08-30 ENCOUNTER — Other Ambulatory Visit: Payer: Self-pay

## 2020-08-30 ENCOUNTER — Ambulatory Visit: Payer: 59 | Admitting: Medical

## 2020-08-30 VITALS — BP 104/60 | HR 71 | Wt 239.6 lb

## 2020-08-30 DIAGNOSIS — R159 Full incontinence of feces: Secondary | ICD-10-CM | POA: Diagnosis not present

## 2020-08-30 DIAGNOSIS — R41840 Attention and concentration deficit: Secondary | ICD-10-CM | POA: Insufficient documentation

## 2020-08-30 DIAGNOSIS — N3281 Overactive bladder: Secondary | ICD-10-CM

## 2020-08-30 DIAGNOSIS — I10 Essential (primary) hypertension: Secondary | ICD-10-CM | POA: Diagnosis not present

## 2020-08-30 DIAGNOSIS — Z23 Encounter for immunization: Secondary | ICD-10-CM | POA: Insufficient documentation

## 2020-08-30 DIAGNOSIS — Z79899 Other long term (current) drug therapy: Secondary | ICD-10-CM

## 2020-08-30 DIAGNOSIS — N39498 Other specified urinary incontinence: Secondary | ICD-10-CM

## 2020-08-30 DIAGNOSIS — E871 Hypo-osmolality and hyponatremia: Secondary | ICD-10-CM | POA: Insufficient documentation

## 2020-08-30 DIAGNOSIS — F329 Major depressive disorder, single episode, unspecified: Secondary | ICD-10-CM | POA: Diagnosis not present

## 2020-08-30 DIAGNOSIS — R32 Unspecified urinary incontinence: Secondary | ICD-10-CM | POA: Insufficient documentation

## 2020-08-30 DIAGNOSIS — R270 Ataxia, unspecified: Secondary | ICD-10-CM | POA: Insufficient documentation

## 2020-08-30 DIAGNOSIS — R413 Other amnesia: Secondary | ICD-10-CM | POA: Insufficient documentation

## 2020-08-30 DIAGNOSIS — E039 Hypothyroidism, unspecified: Secondary | ICD-10-CM | POA: Insufficient documentation

## 2020-08-30 NOTE — Telephone Encounter (Signed)
LVM with info and asked to return call to let us know

## 2020-08-30 NOTE — Progress Notes (Addendum)
Subjective:  Zachary [name] is a 60 y.o. male who presents for Chief Complaint  Patient presents with   can't focus    Can't focus- on computer due to work. When having to use big numbers , can't count sometimes to 10. Just started this week.      Medical team: Dr. Lynder Parents, psychiatry Kandis Nab, PsyD, Neurology  Dr. Redmond School, here for primary care Dr. Lucio Edward, gastroenterology [facility]  Concerns: Here for concerns about focus and attention.   He lives alone.  He is working full-time.  He notes for the last week or 2 he has noticed concerns.  He has made mistakes at work.  He works in receiving.  Lately he loses counts when picking items out of boxes.  Sometimes on the computer it takes him a few minutes to figure out what he is doing.  These are things that should be secondhand to him.  He has also had issues putting the wrong leg on skids or putting things in the wrong sorting box.  His supervisor has not said anything but his coworkers have mentioned this.  In last month or 2 has been more careful with diet.   Has recently added more protein and salt.  He notes that he did stop 2 recent medications on his own.  He stopped the medicine from his gastroenterologist and he stopped a bladder medicine from his urologist.  He could not tell if they were helping so he stopped these.  No recent head injury.  He lives alone.  He handles his own finances.  He denies losing staff at home.  He denies leaving the burner on but he does not cook at home.  He denies getting lost out while driving.  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  He sees psychiatry and next appointment September 22.  His most close family has a brother that lives here in [place].  His brother is aware of his psychiatric and mental and memory issues.  In review of systems he also notes that he has been dealing with urinary incontinence and fecal  incontinence for a while now.  He sees both GI and urology.  He does endorse feeling off balance at times.  No new numbness or tingling, no slurred speech, no recent chest pain or unilateral numbness or tingling.  No other aggravating or relieving factors.    No other c/o.  Past Medical History:  Diagnosis Date   Allergy    Anxiety    Crohn disease ([place])    pt reports subsequent testing was normal   Depression    GERD (gastroesophageal reflux disease)    Herpes simplex    Hyperlipidemia    Hypertension    IBS (irritable bowel syndrome)    Incontinence    Irregular heart beat    Current Outpatient Medications on File Prior to Visit  Medication Sig Dispense Refill   ALPRAZolam (XANAX) 0.5 MG tablet Take 1 tablet (0.5 mg total) by mouth daily as needed. 30 tablet 2   budesonide (ENTOCORT EC) 3 MG 24 hr capsule TAKE 3 CAPSULES BY MOUTH EVERY DAY 90 capsule 0   busPIRone (BUSPAR) 30 MG tablet Take 1 tablet (30 mg total) by mouth 2 (two) times daily. 180 tablet 1   donepezil (ARICEPT) 10 MG tablet TAKE 1 TABLET(10 MG) BY MOUTH AT BEDTIME 90 tablet 1   fluvoxaMINE (LUVOX) 100 MG tablet TAKE 1  TABLET BY MOUTH EVERY MORNING THEN TAKE 3 TABLETS BY MOUTH EVERY EVENING 360 tablet 1   lamoTRIgine (LAMICTAL) 200 MG tablet Take 1 tablet (200 mg total) by mouth daily. 90 tablet 1   levothyroxine (SYNTHROID) 75 MCG tablet TAKE 1 TABLET(75 MCG) BY MOUTH DAILY 90 tablet 3   lipase/protease/amylase (CREON) 36000 UNITS CPEP capsule Take 4  by mouth before each meal and two with each snack 1440 capsule 3   lithium carbonate 150 MG capsule Take 1 capsule (150 mg total) by mouth daily. 90 capsule 3   losartan-hydrochlorothiazide (HYZAAR) 100-12.5 MG tablet TAKE 1 TABLET BY MOUTH DAILY 30 tablet 0   Multiple Vitamin (MULTIVITAMIN WITH MINERALS) TABS Take 1 tablet by mouth daily.     OLANZapine (ZYPREXA) 15 MG tablet TAKE 1 TABLET(15 MG) BY MOUTH AT BEDTIME 90 tablet 0   No current  facility-administered medications on file prior to visit.     The following portions of the patient's history were reviewed and updated as appropriate: allergies, current medications, past family history, past medical history, past social history, past surgical history and problem list.  ROS Otherwise as in subjective above    Objective: BP 104/60   Pulse 71   Wt 239 lb 9.6 oz (108.7 kg)   BMI 29.95 kg/m   Wt Readings from Last 3 Encounters:  08/30/20 239 lb 9.6 oz (108.7 kg)  06/14/20 253 lb (114.8 kg)  04/28/20 252 lb 4 oz (114.4 kg)   General appearance: alert, no distress, well developed, well nourished, white male HEENT: normocephalic, sclerae anicteric, conjunctiva pink and moist, TMs pearly, nares patent, no discharge or erythema, pharynx normal Oral cavity: MMM, no lesions Neck: supple, no lymphadenopathy, no thyromegaly, no masses, no bruits Heart: RRR, normal S1, S2, no murmurs Lungs: CTA bilaterally, no wheezes, rhonchi, or rales Pulses: 2+ radial pulses, 2+ pedal pulses, normal cap refill Ext: no edema Neuro: A&Ox 3, answers questions appropriately, he was little off balance or Romberg and definitely off balance with finger-to-nose.  Otherwise strength sensation normal, DTRs blunted at 1+ upper and lower     Assessment: Encounter Diagnoses  Name Primary?   Essential hypertension Yes   Incontinence of feces, unspecified fecal incontinence type    OAB (overactive bladder)    Current episode of major depressive disorder without prior episode, unspecified depression episode severity    Other urinary incontinence    Memory loss    High risk medication use    Ataxia    Attention and concentration deficit    Hyponatremia    Hypothyroidism, unspecified type    Need for COVID-19 vaccine      Plan: We discussed his symptoms and concerns and there is quite a big differential.  Labs as below today.  I will go ahead and order an MRI of brain.  He may also need  carotid ultrasounds or other testing.  I reviewed back of his neurology consult that he had back in 2019.  They were concerned about memory loss at that time.  I reviewed her recent psychiatry notes in the chart record.  Apparently he has had concerns about memory for several years but he feels like today that something has been off in the last couple weeks.  He will likely need follow-up with neurology and psychiatry in the near future.  Looking back in labs he has had hyponatremia in the past.  Recheck this today.  His recent labs for thyroid a few months ago were okay but  we will recheck those as well today.  Counseled on the Covid virus vaccine.  Vaccine information sheet given.  Covid vaccine given after consent obtained.   Harley was seen today for can't focus.  Diagnoses and all orders for this visit:  Essential hypertension  Incontinence of feces, unspecified fecal incontinence type  OAB (overactive bladder)  Current episode of major depressive disorder without prior episode, unspecified depression episode severity  Other urinary incontinence -     MR Brain Wo Contrast; Future  Memory loss -     TSH + free T4 -     Vitamin B12 -     Comprehensive metabolic panel -     CBC with Differential/Platelet -     MR Brain Wo Contrast; Future  High risk medication use -     Comprehensive metabolic panel -     CBC with Differential/Platelet  Ataxia -     TSH + free T4 -     Vitamin B12 -     Comprehensive metabolic panel -     CBC with Differential/Platelet -     MR Brain Wo Contrast; Future  Attention and concentration deficit -     TSH + free T4 -     Vitamin B12 -     Comprehensive metabolic panel -     CBC with Differential/Platelet  Hyponatremia -     Comprehensive metabolic panel  Hypothyroidism, unspecified type -     TSH + free T4  Need for COVID-19 vaccine   Follow up: pending labs

## 2020-08-31 ENCOUNTER — Other Ambulatory Visit: Payer: Self-pay | Admitting: Psychiatry

## 2020-08-31 LAB — CBC WITH DIFFERENTIAL/PLATELET
Basophils Absolute: 0.1 10*3/uL (ref 0.0–0.2)
Basos: 1 %
EOS (ABSOLUTE): 0.3 10*3/uL (ref 0.0–0.4)
Eos: 3 %
Hematocrit: 40.6 % (ref 37.5–51.0)
Hemoglobin: 13.9 g/dL (ref 13.0–17.7)
Immature Grans (Abs): 0 10*3/uL (ref 0.0–0.1)
Immature Granulocytes: 0 %
Lymphocytes Absolute: 1.6 10*3/uL (ref 0.7–3.1)
Lymphs: 19 %
MCH: 31.2 pg (ref 26.6–33.0)
MCHC: 34.2 g/dL (ref 31.5–35.7)
MCV: 91 fL (ref 79–97)
Monocytes Absolute: 0.8 10*3/uL (ref 0.1–0.9)
Monocytes: 10 %
Neutrophils Absolute: 5.8 10*3/uL (ref 1.4–7.0)
Neutrophils: 67 %
Platelets: 341 10*3/uL (ref 150–450)
RBC: 4.46 x10E6/uL (ref 4.14–5.80)
RDW: 12.1 % (ref 11.6–15.4)
WBC: 8.7 10*3/uL (ref 3.4–10.8)

## 2020-08-31 LAB — COMPREHENSIVE METABOLIC PANEL
ALT: 16 IU/L (ref 0–44)
AST: 17 IU/L (ref 0–40)
Albumin/Globulin Ratio: 2.1 (ref 1.2–2.2)
Albumin: 4.4 g/dL (ref 3.8–4.9)
Alkaline Phosphatase: 88 IU/L (ref 44–121)
BUN/Creatinine Ratio: 10 (ref 9–20)
BUN: 12 mg/dL (ref 6–24)
Bilirubin Total: 0.5 mg/dL (ref 0.0–1.2)
CO2: 22 mmol/L (ref 20–29)
Calcium: 9.3 mg/dL (ref 8.7–10.2)
Chloride: 91 mmol/L — ABNORMAL LOW (ref 96–106)
Creatinine, Ser: 1.26 mg/dL (ref 0.76–1.27)
Globulin, Total: 2.1 g/dL (ref 1.5–4.5)
Glucose: 83 mg/dL (ref 65–99)
Potassium: 5.1 mmol/L (ref 3.5–5.2)
Sodium: 128 mmol/L — ABNORMAL LOW (ref 134–144)
Total Protein: 6.5 g/dL (ref 6.0–8.5)
eGFR: 66 mL/min/{1.73_m2} (ref >59)

## 2020-08-31 LAB — TSH+FREE T4
Free T4: 1.41 ng/dL (ref 0.82–1.77)
TSH: 1.61 u[IU]/mL (ref 0.450–4.500)

## 2020-08-31 LAB — VITAMIN B12: Vitamin B-12: 644 pg/mL (ref 232–1245)

## 2020-08-31 MED ORDER — METHYLPHENIDATE HCL 20 MG PO TABS: 20.0000 mg | ORAL_TABLET | Freq: Two times a day (BID) | ORAL | 0 refills | Status: DC

## 2020-08-31 NOTE — Telephone Encounter (Signed)
Pt would like ritalin sent to [facility] on [address]

## 2020-08-31 NOTE — Telephone Encounter (Signed)
Pt called back and left a message stating that he got toni's message. He would like to try ritalin . His pharmacy is [facility] on [address]

## 2020-08-31 NOTE — Telephone Encounter (Signed)
Rx sent 

## 2020-09-13 ENCOUNTER — Other Ambulatory Visit: Payer: Self-pay

## 2020-09-13 ENCOUNTER — Ambulatory Visit (INDEPENDENT_AMBULATORY_CARE_PROVIDER_SITE_OTHER): Payer: 59 | Admitting: Family Medicine

## 2020-09-13 VITALS — BP 100/68 | HR 83 | Temp 96.0°F | Ht 74.0 in | Wt 231.4 lb

## 2020-09-13 DIAGNOSIS — Z23 Encounter for immunization: Secondary | ICD-10-CM | POA: Diagnosis not present

## 2020-09-13 DIAGNOSIS — Z8042 Family history of malignant neoplasm of prostate: Secondary | ICD-10-CM

## 2020-09-13 DIAGNOSIS — R142 Eructation: Secondary | ICD-10-CM

## 2020-09-13 DIAGNOSIS — M48061 Spinal stenosis, lumbar region without neurogenic claudication: Secondary | ICD-10-CM

## 2020-09-13 DIAGNOSIS — Z8619 Personal history of other infectious and parasitic diseases: Secondary | ICD-10-CM

## 2020-09-13 DIAGNOSIS — I10 Essential (primary) hypertension: Secondary | ICD-10-CM | POA: Diagnosis not present

## 2020-09-13 DIAGNOSIS — Z0000 Encounter for general adult medical examination without abnormal findings: Secondary | ICD-10-CM | POA: Diagnosis not present

## 2020-09-13 DIAGNOSIS — Z125 Encounter for screening for malignant neoplasm of prostate: Secondary | ICD-10-CM

## 2020-09-13 DIAGNOSIS — K8689 Other specified diseases of pancreas: Secondary | ICD-10-CM

## 2020-09-13 DIAGNOSIS — F329 Major depressive disorder, single episode, unspecified: Secondary | ICD-10-CM

## 2020-09-13 DIAGNOSIS — K50818 Crohn's disease of both small and large intestine with other complication: Secondary | ICD-10-CM

## 2020-09-13 DIAGNOSIS — J302 Other seasonal allergic rhinitis: Secondary | ICD-10-CM

## 2020-09-13 DIAGNOSIS — R41840 Attention and concentration deficit: Secondary | ICD-10-CM

## 2020-09-13 DIAGNOSIS — N3281 Overactive bladder: Secondary | ICD-10-CM

## 2020-09-13 DIAGNOSIS — R141 Gas pain: Secondary | ICD-10-CM

## 2020-09-13 DIAGNOSIS — E039 Hypothyroidism, unspecified: Secondary | ICD-10-CM | POA: Diagnosis not present

## 2020-09-13 DIAGNOSIS — R270 Ataxia, unspecified: Secondary | ICD-10-CM

## 2020-09-13 DIAGNOSIS — R143 Flatulence: Secondary | ICD-10-CM

## 2020-09-13 NOTE — Patient Instructions (Signed)
Start taking the budesonide again

## 2020-09-13 NOTE — Progress Notes (Signed)
   Subjective:    Patient ID: Zachary [name], male    DOB: 04/10/1960, 60 y.o.   MRN: [id number]  HPI He is here for complete examination.  He was seen recently and blood work was done to evaluate multiple issues.  His big concern is memory issues as well as ataxia.  He does have an MRI scheduled.  He also continues to see psychiatry and recently had Ritalin added to his regimen.  He is on multiple other psychotropic medications and followed by psychiatry.  He does have urinary incontinence but is stopped taking the oxybutynin stating that it has not really helped.  He is now using depends.  Continues have difficulty with flatulence and also has a history of Crohn's colitis as well as pancreatic insufficiency.  He did stop taking the budesonide thinking he did not need to be on that.  He seems to be doing fairly well on Creon.  He is not having any more back pain.  His allergies seem to be under good control and usually bother him mainly in the spring.  He continues on Synthroid as well as on losartan/HCTZ and is doing well on that.  He does have a family history of prostate cancer and would like a follow-up PSA.  He does have remote history of herpes labialis.  He also has concerns about possible testicular lesions.   Review of Systems  All other systems reviewed and are negative.     Objective:   Physical Exam Alert and in no distress. Tympanic membranes and canals are normal. Pharyngeal area is normal. Neck is supple without adenopathy or thyromegaly. Cardiac exam shows a regular sinus rhythm without murmurs or gallops. Lungs are clear to auscultation. Abdominal exam shows no masses or tenderness.  Genitalia normal circumcised male.  Testes normal.       Assessment & Plan:  Routine general medical examination at a health care facility  Essential hypertension  Crohn's disease of both small and large intestine with other complication (HCC)  Hypothyroidism, unspecified type  OAB  (overactive bladder)  FLATULENCE ERUCTATION AND GAS PAIN  Seasonal allergies  Spinal stenosis of lumbar region, unspecified whether neurogenic claudication present  History of herpes labialis  Current episode of major depressive disorder without prior episode, unspecified depression episode severity  Need for influenza vaccination - Plan: Flu Vaccine QUAD 6+ mos PF IM (Fluarix Quad PF)  Attention and concentration deficit  Ataxia  Family history of prostate cancer - Plan: PSA  Screening for prostate cancer - Plan: PSA  Pancreatic insufficiency He will continue on his present medication regimen.  Since he is not using the oxybutynin I think it is reasonable to just use depends.  He is also to start taking his budesonide again.  Follow-up after MRI comes back.  He will continue on his psychotropic medications.

## 2020-09-14 LAB — PSA: Prostate Specific Ag, Serum: 0.6 ng/mL (ref 0.0–4.0)

## 2020-09-15 ENCOUNTER — Other Ambulatory Visit: Payer: Self-pay

## 2020-09-15 ENCOUNTER — Ambulatory Visit
Admission: RE | Admit: 2020-09-15 | Discharge: 2020-09-15 | Disposition: A | Discharge status: Home / Self Care | Payer: 59 | Source: Ambulatory Visit | Attending: Medical | Admitting: Medical

## 2020-09-15 DIAGNOSIS — R413 Other amnesia: Secondary | ICD-10-CM

## 2020-09-15 DIAGNOSIS — R270 Ataxia, unspecified: Secondary | ICD-10-CM

## 2020-09-15 DIAGNOSIS — N39498 Other specified urinary incontinence: Secondary | ICD-10-CM

## 2020-09-20 ENCOUNTER — Other Ambulatory Visit: Payer: Self-pay

## 2020-09-20 ENCOUNTER — Telehealth: Payer: 59 | Admitting: Family Medicine

## 2020-09-20 ENCOUNTER — Encounter: Payer: Self-pay | Admitting: Family Medicine

## 2020-09-20 VITALS — Ht 75.0 in | Wt 231.0 lb

## 2020-09-20 DIAGNOSIS — Z8042 Family history of malignant neoplasm of prostate: Secondary | ICD-10-CM

## 2020-09-20 NOTE — Progress Notes (Signed)
   Subjective:    Patient ID: Zachary [name], male    DOB: Jul 23, 1960, 60 y.o.   MRN: [id number]  HPI Documentation for virtual audio and video telecommunications through [place] encounter: The patient was located at home. 2 patient identifiers used.  The provider was located in the office. The patient did consent to this visit and is aware of possible charges through their insurance for this visit. The other persons participating in this telemedicine service were none. Time spent on call was 3 minutes and in review of previous records >8 minutes total for counseling and coordination of care.  This virtual service is not related to other E/M service within previous 7 days.  He does have a family history of prostate cancer so PSA was done.  Review of Systems     Objective:   Physical Exam        Assessment & Plan:   Family history of prostate cancer I explained that his likelihood of getting prostate cancer is quite low with a 0.6 PSA score.  He seemed relieved about this.

## 2020-09-21 ENCOUNTER — Ambulatory Visit: Payer: 59 | Admitting: Gastroenterology

## 2020-09-21 ENCOUNTER — Encounter: Payer: Self-pay | Admitting: Gastroenterology

## 2020-09-21 VITALS — BP 100/70 | HR 76 | Ht 73.0 in | Wt 233.1 lb

## 2020-09-21 DIAGNOSIS — K8689 Other specified diseases of pancreas: Secondary | ICD-10-CM | POA: Diagnosis not present

## 2020-09-21 DIAGNOSIS — K50818 Crohn's disease of both small and large intestine with other complication: Secondary | ICD-10-CM

## 2020-09-21 MED ORDER — MESALAMINE 1.2 G PO TBEC: 2.4000 g | DELAYED_RELEASE_TABLET | Freq: Two times a day (BID) | ORAL | 11 refills | Status: DC

## 2020-09-21 NOTE — Progress Notes (Signed)
    History of Present Illness: This is a 60 year old male with pancreatic insufficiency and Crohn's ileocolitis.  He complains of intermittent fecal leakage of mucus and small amounts of loose stool.  The symptoms have substantially improved over the past few months but they persist.  He notes some intermittent intermittent stool odor is likely related to small amounts of fecal leakage.  He has lost 20 pounds by intention since his last office visit.  He desires a weight around 200 pounds.  Current Medications, Allergies, Past Medical History, Past Surgical History, Family History and Social History were reviewed in [employer] record.   Physical Exam: General: Well developed, well nourished, no acute distress Head: Normocephalic and atraumatic Eyes: Sclerae anicteric, EOMI Ears: Normal auditory acuity Mouth: Not examined, mask on during Covid-19 pandemic Lungs: Clear throughout to auscultation Heart: Regular rate and rhythm; no murmurs, rubs or bruits Abdomen: Soft, non tender and non distended. No masses, hepatosplenomegaly or hernias noted. Normal Bowel sounds Rectal: very small amount of brown stool in the perianal area, normal sphincter tone, no lesions noted  Musculoskeletal: Symmetrical with no gross deformities  Pulses:  Normal pulses noted Extremities: No clubbing, cyanosis, edema or deformities noted Neurological: Alert oriented x 4, grossly nonfocal Psychological:  Alert and cooperative. Normal mood and affect   Assessment and Recommendations:  Crohn's ileocolitis, mildly active.  Intermittent fecal leakage with mucus and small amounts of stool.  He wears an adult diaper and intermittent leakage causes him to note an intermittent fecal odor.  Begin Lialda 2.4 g p.o. twice daily.  Continue budesonide 9 mg daily.  If symptoms are not adequately controlled at his REV we will discuss the option of immunosuppressive or biologic therapy.  We also discussed the  option of a repeat colonoscopy to reassess the activity of his Crohn's disease. REV in 6 weeks.  Pancreatic insufficiency.  Continue fat modified diet and Creon at its current dosage and schedule.

## 2020-09-21 NOTE — Patient Instructions (Signed)
We have sent the following medications to your pharmacy for you to pick up at your convenience: Lialda taking 2 tablets by mouth twice daily.   Continue budesonide and Creon at current dose.   Normal BMI (Body Mass Index- based on height and weight) is between 19 and 25. Your BMI today is Body mass index is 30.76 kg/m. Marland Kitchen Please consider follow up  regarding your BMI with your Primary Care Provider.  The [facility] providers would like to encourage you to use [facility] to communicate with providers for non-urgent requests or questions.  Due to long hold times on the telephone, sending your provider a message by [facility] may be a faster and more efficient way to get a response.  Please allow 48 business hours for a response.  Please remember that this is for non-urgent requests.   Thank you for choosing me and [facility].  Pricilla Riffle. Dagoberto Ligas., MD., Marval Regal

## 2020-09-22 ENCOUNTER — Other Ambulatory Visit: Payer: Self-pay

## 2020-09-22 ENCOUNTER — Encounter: Payer: Self-pay | Admitting: Psychiatry

## 2020-09-22 ENCOUNTER — Ambulatory Visit (INDEPENDENT_AMBULATORY_CARE_PROVIDER_SITE_OTHER): Payer: 59 | Admitting: Psychiatry

## 2020-09-22 DIAGNOSIS — E871 Hypo-osmolality and hyponatremia: Secondary | ICD-10-CM

## 2020-09-22 DIAGNOSIS — F3341 Major depressive disorder, recurrent, in partial remission: Secondary | ICD-10-CM

## 2020-09-22 DIAGNOSIS — F4010 Social phobia, unspecified: Secondary | ICD-10-CM | POA: Diagnosis not present

## 2020-09-22 DIAGNOSIS — F422 Mixed obsessional thoughts and acts: Secondary | ICD-10-CM | POA: Diagnosis not present

## 2020-09-22 NOTE — Progress Notes (Signed)
JOANN KULPA [id number] 09-Mar-1960 60 y.o.  Subjective:   Patient ID:  Zachary [name] is a 59 y.o. (DOB 1960/10/20) male.  Chief Complaint:  Chief Complaint  Patient presents with   Follow-up   Depression   Altered Mental Status   Memory Loss    Depression        Associated symptoms include no decreased concentration and no suicidal ideas.  Past medical history includes anxiety.   Anxiety Symptoms include nervous/anxious behavior. Patient reports no confusion, decreased concentration, dizziness, nausea or suicidal ideas.    Beverely Pace Soley presents to the office today for follow-up of depression, obsessive anxiety and paranoia about body odor and getting dementia.  Did feel somewhat reassured about the fact that Dr. Marcos Eke said he does not have Alzheimer's dx.  visit October 16, 2018.  Initiated a trial of naltrexone off label 50 mg daily for impulse control purchasing.  01/16/2019 appointment with the following noted No benefit naltrexone.  Doesn't like his job but fears change in jobs bc "of the farting" and doesn't feel he'd be accepted in other places.  In this job for 16 years without a promotion.  There have bbeen layoffs and he hasn't gotten layoffs.   The same overall with mental health issues.  Nice new boss.   Worries over his thoughts of patient.  Mood stable.   Continued concerns over spending on things he doesn't need or even really want.  Books he'll never read or DVDs he'll never watch. Huge problem buying too much off the Internet.  Blew check from incentive on impulse purchase from the Internet.  Books and collectibles per usual.  Running up debt.  No other impulsivity.   Not hyper nor otherwise manic.    No history of bankruptcy.  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  Maybe out of boredom.  Needs to stop and can't control himself.Has not talked to anyone about it.  Doesn't get anxious around the spending.  Not depressed.  Busy at work.  Sleep fine.   Sometimes anxious about passing gas at work and not otherwise anxious there.  Rarely used Xanax.   Depression has remained under control. Overall he thinks the depression and anxiety levels have been "fine".  Friends notice his comprehension problems. Not sure he trusts the neuropsychological testing which was normal and did not show dementia as he fears.  Plan: No meds changed.  07/23/2019 appointment with the following noted: Concerns over concentration and memory.  Thinks he's having trouble with word-finding.  Bosses satisfied with work response and function. Plan: Check B12 and folated DT cognitive complaints. Trial reduction in olanzapine to see if cognition is better to 15 mg daily.  11/25/2019 appointment with the following noted: Thinks he had a lot of olanzapine 20 and kept taking it and never reduced it yet. No mental health changes since here.  Work very busy.  Regular anxiety thing but no depression.  Obsesses over farting and bothering people. Plan: Trial reduction in olanzapine to see if cognition is better to 15 mg daily.  03/22/2020 appointment with following noted: Might be a little better with cognition with the reduction in olanzapine.  No worse anxiety, fear, depression.  Worry is not worse.  Still sleeps well.   Overall things are fine in his mental health and function.  Still working.  Tired of the job and considering job at [employer].  Got confused by the online app and dropped it.  Work pretty busy.  Employee situation stable.   Plan: Trial reduction in olanzapine to see if cognition is better to 15 mg daily may be succcessful and nothing is worse so no change in dose..  08/26/20 TC:  Pt did not give me any info other thren he can't concentrate enough to count to 30.I asked him did he make any med changes lately and he said he stopped taking meds for incontinence but that's all.I will have admins add him to the cancellation list,but he wants to know if anything can be done about  the concentration.  09/22/20  appt noted: Hard time at work.  More trouble with concentration and memory.  Seemed worse and went to PCP and lab work up.  Normal B12, TSH, CBC, RPR. Head MRI mild atrophy. 08/30/20  Na 128 Was told to discuss psych meds and low sodium.   Sunday had a few hours of giddiness and then again yesterday and doesn't remember feeling that good as an adult.  Had not changed meds or used drugs.    Patient denies difficulty with sleep initiation or maintenance over 8 hours. Denies appetite disturbance.  Patient reports that energy and motivation have been good.  Patient denies any difficulty with concentration.  Patient denies any suicidal ideation.  Good interest and enjoyment.  F has unknown cancer in chemo in early 87's.   Talk every other day.   Past psych med: Abilify, Zyprexa, Seroquel 600, olanzapine 30 Wellbutrin, nortriptyline, clomipramine, mirtazapine,  fluvoxamine 400, Lamotrigine, lithium no response,  stimulants, Aricept  buspirone 60,   Xanax Naltrexone for compulsive buying NR  Review of Systems:  Review of Systems  Respiratory:  Negative for cough.   Gastrointestinal:  Positive for diarrhea. Negative for abdominal distention, nausea and vomiting.  Neurological:  Negative for dizziness, tremors and weakness.  Psychiatric/Behavioral:  Positive for depression. Negative for agitation, behavioral problems, confusion, decreased concentration, dysphoric mood, hallucinations, self-injury, sleep disturbance and suicidal ideas. The patient is nervous/anxious. The patient is not hyperactive.    Medications: I have reviewed the patient's current medications.  Current Outpatient Medications  Medication Sig Dispense Refill   ALPRAZolam (XANAX) 0.5 MG tablet Take 1 tablet (0.5 mg total) by mouth daily as needed. 30 tablet 2   aspirin 81 MG EC tablet Take 81 mg by mouth daily. Swallow whole.     budesonide (ENTOCORT EC) 3 MG 24 hr capsule TAKE 3 CAPSULES BY MOUTH  EVERY DAY 90 capsule 0   busPIRone (BUSPAR) 30 MG tablet Take 1 tablet (30 mg total) by mouth 2 (two) times daily. 180 tablet 1   donepezil (ARICEPT) 10 MG tablet TAKE 1 TABLET(10 MG) BY MOUTH AT BEDTIME 90 tablet 1   fluvoxaMINE (LUVOX) 100 MG tablet TAKE 1 TABLET BY MOUTH EVERY MORNING THEN TAKE 3 TABLETS BY MOUTH EVERY EVENING 360 tablet 1   lamoTRIgine (LAMICTAL) 200 MG tablet Take 1 tablet (200 mg total) by mouth daily. 90 tablet 1   levothyroxine (SYNTHROID) 75 MCG tablet TAKE 1 TABLET(75 MCG) BY MOUTH DAILY 90 tablet 3   lipase/protease/amylase (CREON) 36000 UNITS CPEP capsule Take 4  by mouth before each meal and two with each snack 1440 capsule 3   lithium carbonate 150 MG capsule Take 1 capsule (150 mg total) by mouth daily. 90 capsule 3   losartan-hydrochlorothiazide (HYZAAR) 100-12.5 MG tablet TAKE 1 TABLET BY MOUTH DAILY 30 tablet 0   mesalamine (LIALDA) 1.2 g EC tablet Take 2 tablets (2.4 g total) by mouth in the morning  and at bedtime. 120 tablet 11   methylphenidate (RITALIN) 20 MG tablet Take 1 tablet (20 mg total) by mouth 2 (two) times daily. 60 tablet 0   Multiple Vitamin (MULTIVITAMIN WITH MINERALS) TABS Take 1 tablet by mouth daily.     OLANZapine (ZYPREXA) 15 MG tablet TAKE 1 TABLET(15 MG) BY MOUTH AT BEDTIME 90 tablet 0   No current facility-administered medications for this visit.    Medication Side Effects: None  Allergies:  Allergies  Allergen Reactions   Erythromycin     Pt unsure, reports he recently received a -mycin drug with no reaction.    Penicillins     REACTION: "it may kill me"   Tetracyclines & Related Hives    Past Medical History:  Diagnosis Date   Allergy    Anxiety    Crohn disease ([place])    pt reports subsequent testing was normal   Depression    GERD (gastroesophageal reflux disease)    Herpes simplex    Hyperlipidemia    Hypertension    IBS (irritable bowel syndrome)    Incontinence    Irregular heart beat     Family History   Problem Relation Age of Onset   Arthritis Mother    Stroke Mother    Depression Mother    Hypertension Father    Prostate cancer Father 29   Cancer Maternal Aunt    Prostate cancer Maternal Uncle    Breast cancer Maternal Grandmother    Cancer Maternal Grandfather    Cancer Paternal Grandmother    Prostate cancer Paternal Uncle    Colon cancer Neg Hx    Rectal cancer Neg Hx    Stomach cancer Neg Hx    Esophageal cancer Neg Hx    Pancreatic cancer Neg Hx     Social History   Socioeconomic History   Marital status: Single    Spouse name: Not on file   Number of children: Not on file   Years of education: Not on file   Highest education level: Not on file  Occupational History   Not on file  Tobacco Use   Smoking status: Former    Types: Cigarettes    Quit date: 1980    Years since quitting: 42.7   Smokeless tobacco: Never  Vaping Use   Vaping Use: Never used  Substance and Sexual Activity   Alcohol use: No   Drug use: No   Sexual activity: Not Currently  Other Topics Concern   Not on file  Social History Narrative   Not on file   Social Determinants of Health   Financial Resource Strain: Not on file  Food Insecurity: Not on file  Transportation Needs: Not on file  Physical Activity: Not on file  Stress: Not on file  Social Connections: Not on file  Intimate Partner Violence: Not on file    Past Medical History, Surgical history, Social history, and Family history were reviewed and updated as appropriate.   Please see review of systems for further details on the patient's review from today.   Objective:   Physical Exam:  There were no vitals taken for this visit.  Physical Exam Constitutional:      General: He is not in acute distress.    Appearance: He is well-developed.  Musculoskeletal:        General: No deformity.  Neurological:     Mental Status: He is alert and oriented to person, place, and time.     Motor: No  tremor.     Coordination:  Coordination abnormal. Heel to Shin Test abnormal.     Comments: Fair heel to toe gait  Psychiatric:        Attention and Perception: Attention normal. He is attentive.        Mood and Affect: Mood is not anxious or depressed. Affect is blunt. Affect is not labile, angry or inappropriate.        Speech: Speech is not rapid and pressured, delayed or slurred.        Behavior: Behavior normal. Behavior is not agitated or hyperactive.        Thought Content: Thought content is paranoid. Thought content does not include homicidal or suicidal ideation. Thought content does not include homicidal or suicidal plan.        Cognition and Memory: Cognition normal.        Judgment: Judgment is inappropriate.     Comments: Insight is fair. Chronic obs/paranoia about passing gas at work is not mentioned today with other concerns predominating.    Lab Review:     Component Value Date/Time   NA 128 (L) 08/30/2020 1325   K 5.1 08/30/2020 1325   CL 91 (L) 08/30/2020 1325   CO2 22 08/30/2020 1325   GLUCOSE 83 08/30/2020 1325   GLUCOSE 87 03/04/2020 1941   BUN 12 08/30/2020 1325   CREATININE 1.26 08/30/2020 1325   CREATININE 0.91 07/30/2013 0913   CALCIUM 9.3 08/30/2020 1325   PROT 6.5 08/30/2020 1325   ALBUMIN 4.4 08/30/2020 1325   AST 17 08/30/2020 1325   ALT 16 08/30/2020 1325   ALKPHOS 88 08/30/2020 1325   BILITOT 0.5 08/30/2020 1325   GFRNONAA >60 03/04/2020 1941   GFRAA 90 09/11/2018 1120       Component Value Date/Time   WBC 8.7 08/30/2020 1325   WBC 9.2 03/04/2020 1941   RBC 4.46 08/30/2020 1325   RBC 4.26 03/04/2020 1941   HGB 13.9 08/30/2020 1325   HCT 40.6 08/30/2020 1325   PLT 341 08/30/2020 1325   MCV 91 08/30/2020 1325   MCH 31.2 08/30/2020 1325   MCH 32.2 03/04/2020 1941   MCHC 34.2 08/30/2020 1325   MCHC 35.1 03/04/2020 1941   RDW 12.1 08/30/2020 1325   LYMPHSABS 1.6 08/30/2020 1325   MONOABS 1.0 04/24/2019 1053   EOSABS 0.3 08/30/2020 1325   BASOSABS 0.1 08/30/2020  1325    Lithium Lvl  Date Value Ref Range Status  04/18/2018 <0.3 (L) 0.6 - 1.2 mmol/L Final    Comment:    Verified by repeat analysis. Marland Kitchen      10/26/2019 B12 [id number]  No results found for: PHENYTOIN, PHENOBARB, VALPROATE, CBMZ   .res Assessment: Plan:    [place] was seen today for follow-up, depression, altered mental status and memory loss.  Diagnoses and all orders for this visit:  Recurrent major depression in partial remission ([place])  Hyponatremia -     Basic metabolic panel  Mixed obsessional thoughts and acts  Social anxiety disorder  [name] has been diagnosed with treatment resistant depression with psychotic features, OCD and social anxiety but schizoaffective may be more appropriate diagnosis given this chronic paranoid thinking.  Specifically he is chronically  suspicious that he has dementia despite neuropsychological testing which tells him he does not.  He also has chronic thoughts that other people find his odor offensive because of passing gas.  There is very little evidence to support this.  He has been under my psychiatric care for  many many years and never owed noticed body odor and appointments at any time.  He also suspects that people are thinking negatively of him regularly.  Overall his paranoia and anxiety and depression are unchanged with reduction in olanzapine to 79m daily months ago .    Discussed the risk of serotonin syndrome with the writer than usual dose of fluvoxamine but again this appears to have been helpful for his anxiety and obsessive thinking.  Discussed potential metabolic side effects associated with atypical antipsychotics, as well as potential risk for movement side effects. Advised pt to contact office if movement side effects occur.  No evidence of movement disorder. Still obsessing on farting and thinking others notice but it's never been noticeable in years of seeing him and he never gets complaints from other about it.  Discussed  the risk of polypharmacy but again if appears medically necessary, helpful and well tolerated.  Patient had a recent episode of hyponatremia with some mental status changes.  The mental status changes have resolved.  We discussed the possibility that virtually every psychiatric medicine can cause low sodium as can hydrochlorothiazide.  Typically SSRIs are the most common cause.  We discussed the risk of reducing his psychiatric medication.  However the hyponatremia must be addressed.  Reduce fluvoxamine to 3 at night Stop lithium Add salt tablet 1 twice daily with food Repeat sodium level in 1 week. Sent message to patient's primary care physician regarding the possibility of stopping hydrochlorothiazide which could be contributing to the low sodium.  FU 4 weeks  CLynder Parents MD, DFAPA  Please see After Visit Summary for patient specific instructions.  Future Appointments  Date Time Provider [place] 10/19/2020  3:00 PM Cottle, CBilley Co, MD CP-CP None  11/04/2020 10:50 AM SLadene Artist [facility] 09/20/[facility]21  8:30 AM LDenita Lung MD PFM-PFM [facility]    Orders Placed This Encounter  Procedures   Basic metabolic panel       -------------------------------

## 2020-09-22 NOTE — Patient Instructions (Signed)
Reduce fluvoxamine to 3 at night Stop lithium Add salt tablet 1 twice daily with food Repeat sodium level in 1 week.

## 2020-09-23 ENCOUNTER — Telehealth: Payer: Self-pay

## 2020-09-23 ENCOUNTER — Telehealth: Payer: Self-pay | Admitting: Psychiatry

## 2020-09-23 NOTE — Telephone Encounter (Signed)
Called pt to advise of DR. Lalonde advice. No answer and lvm . Will try again later. [place]

## 2020-09-23 NOTE — Telephone Encounter (Signed)
-----   Message from Denita Lung, MD sent at 09/23/2020  8:12 AM EDT ----- Regarding: FW: hyponatremia Can you call him and have him stop the HCTZ and follow-up with me in about a month ----- Message ----- From: Purnell Shoemaker., MD Sent: 09/22/2020   5:53 PM EDT To: Denita Lung, MD Subject: hyponatremia                                   Zachary [name],  Our mutual patient Zachary [name] has hyponatremia with a last sodium level of 128.  As you are aware virtually every psychiatric medication as well as antiepileptics can cause low sodium and he is on several.  Thiazide diuretics can also cause low sodium.  He is on hydrochlorothiazide.  Clinically he is stable today and is not disoriented or showing other signs of hyponatremia.  He has had a couple of low sodium readings.  Of the psychiatric medications that can cause low sodium SSRIs are the most common cause.  He is on high dose fluvoxamine so I will reduce the dose and repeat the sodium level in a week.  I cannot take him off fluvoxamine quickly without causing withdrawal symptoms and likely a great deal of anxiety.  Can you stop the hydrochlorothiazide?  Even temporarily to decide if that is a causal factor would be helpful.  He is on 3 other psychiatric medicines that also can cause low low sodium so it will be a prolonged process of elimination to determine the cause and any help possible to speed that process would be appreciated.  I am also concerned about psychiatric decompensation caused by stopping these psych meds in sequence.  He has a history of severe depression, obsessions, and paranoia but is stable at the moment.  Thank you for your attention.  Lynder Parents MD

## 2020-09-23 NOTE — Telephone Encounter (Signed)
Patient Zachary [name] stating that Dr Redmond School advised him to discontinue his medication per a visit with him. He is inquiring if he should discontinue the medication that was altered by Dr Clovis Pu on last night? Please advise. # [id number]. Patients next office visit is scheduled for 10/19.

## 2020-09-23 NOTE — Telephone Encounter (Signed)
Pended.

## 2020-09-26 ENCOUNTER — Other Ambulatory Visit: Payer: Self-pay | Admitting: Psychiatry

## 2020-09-26 ENCOUNTER — Telehealth: Payer: Self-pay

## 2020-09-26 DIAGNOSIS — F4010 Social phobia, unspecified: Secondary | ICD-10-CM

## 2020-09-26 DIAGNOSIS — F422 Mixed obsessional thoughts and acts: Secondary | ICD-10-CM

## 2020-09-26 DIAGNOSIS — F3341 Major depressive disorder, recurrent, in partial remission: Secondary | ICD-10-CM

## 2020-09-26 NOTE — Telephone Encounter (Signed)
Initiated PA for Lialda on cover my meds. Received notice today that PA was denied. Please advise Dr. Fuller Plan what other alternative you want patient to start.

## 2020-09-26 NOTE — Telephone Encounter (Signed)
Apriso 1.5 g po qd refills for 1 year

## 2020-09-26 NOTE — Telephone Encounter (Signed)
Left message for patient to return my call.

## 2020-09-27 ENCOUNTER — Telehealth: Payer: Self-pay | Admitting: Psychiatry

## 2020-09-27 ENCOUNTER — Telehealth: Payer: Self-pay

## 2020-09-27 DIAGNOSIS — F423 Hoarding disorder: Secondary | ICD-10-CM

## 2020-09-27 DIAGNOSIS — F422 Mixed obsessional thoughts and acts: Secondary | ICD-10-CM

## 2020-09-27 DIAGNOSIS — G3184 Mild cognitive impairment, so stated: Secondary | ICD-10-CM

## 2020-09-27 DIAGNOSIS — F4010 Social phobia, unspecified: Secondary | ICD-10-CM

## 2020-09-27 MED ORDER — MESALAMINE ER 0.375 G PO CP24: 1500.0000 mg | ORAL_CAPSULE | Freq: Every day | ORAL | 11 refills | Status: DC

## 2020-09-27 NOTE — Telephone Encounter (Signed)
He can hold off starting salt tablets.  Wait 1 week after stopping blood pressure med and reducing the fluvoxamine and then get the blood test for sodium.  Does not require fasting and anytime of day

## 2020-09-27 NOTE — Telephone Encounter (Signed)
Informed patient an alternative has been sent to his pharmacy in place of the [facility]. Informed patient to let our office know if this medication is not covered. Patient verbalized understanding.

## 2020-09-27 NOTE — Telephone Encounter (Signed)
Pt LVM stating he cannot find the salt tablets Dr. Clovis Pu advised him to get.  He has been to 5-10 pharmacies.  One pharmacist didn't know what he was talking about, the other directed him to [place] salt aisle.  Since he hasn't been able to find the salt tablet he wants to know if you still want him to go have the blood test (couldn't remember if it was for this week or next week.)  Also notifying Dr Clovis Pu that his blood pressure med was discontinued.

## 2020-09-27 NOTE — Telephone Encounter (Signed)
Lvm for pt to call back about his thyroid medicine concern. [place]

## 2020-09-27 NOTE — Telephone Encounter (Signed)
Please review

## 2020-09-28 NOTE — Telephone Encounter (Signed)
LVM with info

## 2020-09-30 ENCOUNTER — Other Ambulatory Visit: Payer: Self-pay | Admitting: Family Medicine

## 2020-09-30 DIAGNOSIS — I10 Essential (primary) hypertension: Secondary | ICD-10-CM

## 2020-09-30 NOTE — Telephone Encounter (Signed)
Please advise if pt should continue to hold this med due to low sodium level and Psy med. We can also send in losartan with out HCTZ. Please advise [facility]

## 2020-10-07 ENCOUNTER — Other Ambulatory Visit: Payer: Self-pay | Admitting: Psychiatry

## 2020-10-07 MED ORDER — SODIUM CHLORIDE 1 G PO TABS: 1.0000 g | ORAL_TABLET | Freq: Three times a day (TID) | ORAL | 0 refills | Status: DC

## 2020-10-07 NOTE — Telephone Encounter (Signed)
Don't repeat the blood test since he's not been taking the salt.  Since he had trouble finding it.  I sent a prescription for it to his [facility] [place].  That should allow him to get it now.

## 2020-10-07 NOTE — Telephone Encounter (Signed)
Refer to message on 9/27 concerning the salt tablets. Patient called today inquiring again the course of action due to not finding the tablets. He is also inquiring if he should do the salt test today. Please advise.

## 2020-10-07 NOTE — Telephone Encounter (Signed)
Please see message. °

## 2020-10-07 NOTE — Telephone Encounter (Signed)
Pt informed

## 2020-10-10 ENCOUNTER — Other Ambulatory Visit: Payer: Self-pay | Admitting: Psychiatry

## 2020-10-13 ENCOUNTER — Other Ambulatory Visit: Payer: Self-pay

## 2020-10-13 ENCOUNTER — Encounter (HOSPITAL_COMMUNITY): Payer: Self-pay | Admitting: Emergency Medicine

## 2020-10-13 ENCOUNTER — Ambulatory Visit (HOSPITAL_COMMUNITY)
Admission: EM | Admit: 2020-10-13 | Discharge: 2020-10-13 | Disposition: A | Discharge status: Home / Self Care | Payer: 59 | Attending: Physician Assistant | Admitting: Physician Assistant

## 2020-10-13 DIAGNOSIS — J029 Acute pharyngitis, unspecified: Secondary | ICD-10-CM

## 2020-10-13 DIAGNOSIS — Z20822 Contact with and (suspected) exposure to covid-19: Secondary | ICD-10-CM | POA: Insufficient documentation

## 2020-10-13 DIAGNOSIS — Z79890 Hormone replacement therapy (postmenopausal): Secondary | ICD-10-CM | POA: Insufficient documentation

## 2020-10-13 DIAGNOSIS — Z7952 Long term (current) use of systemic steroids: Secondary | ICD-10-CM | POA: Insufficient documentation

## 2020-10-13 DIAGNOSIS — R0981 Nasal congestion: Secondary | ICD-10-CM | POA: Insufficient documentation

## 2020-10-13 DIAGNOSIS — R112 Nausea with vomiting, unspecified: Secondary | ICD-10-CM | POA: Insufficient documentation

## 2020-10-13 DIAGNOSIS — R051 Acute cough: Secondary | ICD-10-CM | POA: Diagnosis present

## 2020-10-13 DIAGNOSIS — K5090 Crohn's disease, unspecified, without complications: Secondary | ICD-10-CM | POA: Insufficient documentation

## 2020-10-13 DIAGNOSIS — Z881 Allergy status to other antibiotic agents status: Secondary | ICD-10-CM | POA: Diagnosis not present

## 2020-10-13 DIAGNOSIS — Z880 Allergy status to penicillin: Secondary | ICD-10-CM | POA: Insufficient documentation

## 2020-10-13 DIAGNOSIS — Z79899 Other long term (current) drug therapy: Secondary | ICD-10-CM | POA: Diagnosis not present

## 2020-10-13 DIAGNOSIS — J069 Acute upper respiratory infection, unspecified: Secondary | ICD-10-CM | POA: Diagnosis not present

## 2020-10-13 LAB — SARS CORONAVIRUS 2 (TAT 6-24 HRS): SARS Coronavirus 2: NEGATIVE

## 2020-10-13 LAB — POC INFLUENZA A AND B ANTIGEN (URGENT CARE ONLY)
INFLUENZA A ANTIGEN, POC: NEGATIVE
INFLUENZA B ANTIGEN, POC: NEGATIVE

## 2020-10-13 MED ORDER — ONDANSETRON 4 MG PO TBDP: 4.0000 mg | ORAL_TABLET | Freq: Three times a day (TID) | ORAL | 0 refills | Status: DC | PRN

## 2020-10-13 MED ORDER — ONDANSETRON 4 MG PO TBDP
4.0000 mg | ORAL_TABLET | Freq: Once | ORAL | Status: AC
Start: 2020-10-13 — End: 2020-10-13
  Administered 2020-10-13: 4 mg via ORAL

## 2020-10-13 MED ORDER — ONDANSETRON 4 MG PO TBDP
ORAL_TABLET | ORAL | Status: AC
  Filled 2020-10-13: qty 1

## 2020-10-13 NOTE — Discharge Instructions (Addendum)
Your flu test was negative.  We will contact you if your COVID test is positive.  Feel free to contact our office if you are interested in getting this test result since we do not typically call when it is negative.  Go home and rest.  Alternate Tylenol and ibuprofen for fever and pain.  Use over-the-counter medications for congestion and cough.  I have called in Zofran to be taken up to 3 times a day as needed.  Make sure that you are able to eat and drink plenty of fluid; if you have any concern for dehydration or unable to keep food/liquids down you need to be seen immediately.  If you have any worsening symptoms including chest pain, high fever, persistent nausea/vomiting interfering with oral intake, shortness of breath, weakness you need to go to the emergency room.

## 2020-10-13 NOTE — ED Provider Notes (Signed)
[place]    CSN: [id number] Arrival date & time: 10/13/20  8182      History   Chief Complaint Chief Complaint  Patient presents with   Sore Throat   Chills   Cough   Nasal Congestion    HPI Zachary [name] is a 60 y.o. male.   Patient presents today with a 1 day history of URI symptoms.  Reports a severe sore throat that has since improved.  He reports nasal congestion, cough, headache, nausea, vomiting.  Patient reports that symptoms began this morning he was feeling extremely nauseous but did not begin vomiting until our clinic.  He has vomited twice but denies any hematemesis.  Denies any diarrhea, abdominal pain, chest pain, shortness of breath.  He does report being around someone reported a sore throat but is not sure whether they were ultimately diagnosed with.  He is up-to-date on vaccinations including COVID and flu vaccine.  He denies any recent antibiotics.  He has not tried any over-the-counter medications for symptom management.  He does have a history of allergies and is taking antihistamines as prescribed.  Denies history of asthma or COPD.  He does not smoke.  He denies history of diabetes, heart disease, chronic liver/kidney disease.  He does have a history of Crohn's disease.    Past Medical History:  Diagnosis Date   Allergy    Anxiety    Crohn disease ([place])    pt reports subsequent testing was normal   Depression    GERD (gastroesophageal reflux disease)    Herpes simplex    Hyperlipidemia    Hypertension    IBS (irritable bowel syndrome)    Incontinence    Irregular heart beat     Patient Active Problem List   Diagnosis Date Noted   Family history of prostate cancer 09/13/2020   Absence of bladder continence 08/30/2020   Memory loss 08/30/2020   High risk medication use 08/30/2020   Ataxia 08/30/2020   Attention and concentration deficit 08/30/2020   Hypothyroidism 08/30/2020   Incontinence of feces 11/03/2019   OAB  (overactive bladder) 10/26/2019   Pancreatic insufficiency 10/26/2019   Spinal stenosis of lumbar region 03/03/2018   Essential hypertension 01/27/2018   OCD (obsessive compulsive disorder) 10/10/2017   Social anxiety disorder 10/10/2017   Seasonal allergies 06/26/2010   History of herpes labialis 06/26/2010   FLATULENCE ERUCTATION AND GAS PAIN 09/23/2007   Major depression 09/22/2007   CROHN'S DISEASE, LARGE AND SMALL INTESTINES 09/22/2007   COLONIC POLYPS, HYPERPLASTIC, HX OF 09/22/2007    Past Surgical History:  Procedure Laterality Date   APPENDECTOMY     COLONOSCOPY     POLYPECTOMY     TWISTED TESTICLES  1970   [place] Medications    Prior to Admission medications   Medication Sig Start Date End Date Taking? Authorizing Provider  ondansetron (ZOFRAN ODT) 4 MG disintegrating tablet Take 1 tablet (4 mg total) by mouth every 8 (eight) hours as needed for nausea or vomiting. 10/13/20  Yes Maven Rosander, Derry Skill, PA-C  ALPRAZolam (XANAX) 0.5 MG tablet Take 1 tablet (0.5 mg total) by mouth daily as needed. 03/22/20   Cottle, Billey Co., MD  aspirin 81 MG EC tablet Take 81 mg by mouth daily. Swallow whole.    [provider]  budesonide (ENTOCORT EC) 3 MG 24 hr capsule TAKE 3 CAPSULES BY MOUTH EVERY DAY 08/22/20  Ladene Artist, MD  busPIRone (BUSPAR) 30 MG tablet Take 1 tablet (30 mg total) by mouth 2 (two) times daily. 03/22/20   Cottle, Billey Co., MD  donepezil (ARICEPT) 10 MG tablet TAKE 1 TABLET(10 MG) BY MOUTH AT BEDTIME 03/22/20   Cottle, Billey Co., MD  fluvoxaMINE (LUVOX) 100 MG tablet TAKE 1 TABLET BY MOUTH EVERY MORNING THEN TAKE 3 TABLETS BY MOUTH EVERY EVENING 03/22/20   Cottle, Billey Co., MD  lamoTRIgine (LAMICTAL) 200 MG tablet Take 1 tablet (200 mg total) by mouth daily. 03/22/20   Cottle, Billey Co., MD  levothyroxine (SYNTHROID) 75 MCG tablet TAKE 1 TABLET(75 MCG) BY MOUTH DAILY 09/25/19   Denita Lung, MD   lipase/protease/amylase (CREON) 36000 UNITS CPEP capsule Take 4  by mouth before each meal and two with each snack 11/03/19   Zehr, Janett Billow D, PA-C  lithium carbonate 150 MG capsule Take 1 capsule (150 mg total) by mouth daily. 11/25/19   Cottle, Billey Co., MD  losartan-hydrochlorothiazide [facility]) 100-12.5 MG tablet TAKE 1 TABLET BY MOUTH DAILY 09/30/20   Denita Lung, MD  mesalamine (APRISO) 0.375 g 24 hr capsule Take 4 capsules (1.5 g total) by mouth daily. 09/27/20 10/27/20  Ladene Artist, MD  methylphenidate (RITALIN) 20 MG tablet Take 1 tablet (20 mg total) by mouth 2 (two) times daily. 08/31/20   Cottle, Billey Co., MD  Multiple Vitamin (MULTIVITAMIN WITH MINERALS) TABS Take 1 tablet by mouth daily.    [provider]  OLANZapine (ZYPREXA) 15 MG tablet TAKE 1 TABLET(15 MG) BY MOUTH AT BEDTIME 09/26/20   Cottle, Billey Co., MD  sodium chloride 1 g tablet TAKE 1 TABLET(1 GRAM) BY MOUTH THREE TIMES DAILY 10/10/20   Cottle, Billey Co., MD    Family History Family History  Problem Relation Age of Onset   Arthritis Mother    Stroke Mother    Depression Mother    Hypertension Father    Prostate cancer Father 55   Cancer Maternal Aunt    Prostate cancer Maternal Uncle    Breast cancer Maternal Grandmother    Cancer Maternal Grandfather    Cancer Paternal Grandmother    Prostate cancer Paternal Uncle    Colon cancer Neg Hx    Rectal cancer Neg Hx    Stomach cancer Neg Hx    Esophageal cancer Neg Hx    Pancreatic cancer Neg Hx     Social History Social History   Tobacco Use   Smoking status: Former    Types: Cigarettes    Quit date: 1980    Years since quitting: 42.8   Smokeless tobacco: Never  Vaping Use   Vaping Use: Never used  Substance Use Topics   Alcohol use: No   Drug use: No     Allergies   Erythromycin, Penicillins, and Tetracyclines & related   Review of Systems Review of Systems  Constitutional:  Positive for activity change, appetite  change, chills and fatigue. Negative for fever.  HENT:  Positive for congestion and sore throat. Negative for sinus pressure and sneezing.   Respiratory:  Positive for cough. Negative for shortness of breath.   Cardiovascular:  Negative for chest pain.  Gastrointestinal:  Positive for nausea and vomiting. Negative for abdominal pain and diarrhea.  Musculoskeletal:  Negative for arthralgias and myalgias.  Neurological:  Positive for headaches. Negative for dizziness and light-headedness.    Physical Exam Triage Vital Signs ED Triage Vitals  Enc Vitals Group     BP 10/13/20 0856 (!) 159/104     Pulse Rate 10/13/20 0856 72     Resp 10/13/20 0856 18     Temp 10/13/20 0856 97.6 F (36.4 C)     Temp Source 10/13/20 0856 Oral     SpO2 10/13/20 0856 98 %     Weight --      Height --      Head Circumference --      Peak Flow --      Pain Score 10/13/20 0854 0     Pain Loc --      Pain Edu? --      Excl. in [place]? --    No data found.  Updated Vital Signs BP (!) 159/104 (BP Location: Right Arm)   Pulse 72   Temp 97.6 F (36.4 C) (Oral)   Resp 18   SpO2 98%   Visual Acuity Right Eye Distance:   Left Eye Distance:   Bilateral Distance:    Right Eye Near:   Left Eye Near:    Bilateral Near:     Physical Exam Vitals reviewed.  Constitutional:      General: He is awake.     Appearance: Normal appearance. He is well-developed. He is not ill-appearing.     Comments: Very pleasant male appears stated age actively vomiting  HENT:     Head: Normocephalic and atraumatic.     Right Ear: Tympanic membrane, ear canal and external ear normal. Tympanic membrane is not erythematous or bulging.     Left Ear: Tympanic membrane, ear canal and external ear normal. Tympanic membrane is not erythematous or bulging.     Nose: Nose normal.     Mouth/Throat:     Pharynx: Uvula midline. Posterior oropharyngeal erythema present. No oropharyngeal exudate.  Cardiovascular:     Rate and Rhythm:  Normal rate and regular rhythm.     Heart sounds: Normal heart sounds, S1 normal and S2 normal. No murmur heard. Pulmonary:     Effort: Pulmonary effort is normal. No accessory muscle usage or respiratory distress.     Breath sounds: Normal breath sounds. No stridor. No wheezing, rhonchi or rales.     Comments: Clear to auscultation bilaterally Abdominal:     General: Bowel sounds are normal.     Palpations: Abdomen is soft.     Tenderness: There is no abdominal tenderness. There is no right CVA tenderness, left CVA tenderness, guarding or rebound.     Comments: Benign abdominal exam  Neurological:     Mental Status: He is alert.  Psychiatric:        Behavior: Behavior is cooperative.     UC Treatments / Results  Labs (all labs ordered are listed, but only abnormal results are displayed) Labs Reviewed  SARS CORONAVIRUS 2 (TAT 6-24 HRS)  POC INFLUENZA A AND B ANTIGEN (URGENT CARE ONLY)    EKG   Radiology No results found.  Procedures Procedures (including critical care time)  Medications Ordered in UC Medications  ondansetron (ZOFRAN-ODT) disintegrating tablet 4 mg (4 mg Oral Given 10/13/20 0911)    Initial Impression / Assessment and Plan / UC Course  I have reviewed the triage vital signs and the nursing notes.  Pertinent labs & imaging results that were available during my care of the patient were reviewed by me and considered in my medical decision making (see chart for details).      Vital signs and physical exam  reassuring today; no indication for emergent evaluation or imaging.  Discussed likely viral etiology given short duration of symptoms.  Flu testing was negative.  COVID test is pending.  Patient was instructed to remain in isolation until COVID test results are obtained and he was given work excuse note with current CDC return to work guidelines based on COVID test result.  Patient had resolution of nausea and emesis with Zofran in clinic.  He was provided  a prescription for this with instruction to take it on a scheduled basis for the next 24 hours then use it as needed.  Discussed the importance of eating a bland diet and drinking plenty of fluid.  He can use over-the-counter medications for additional symptom relief including Tylenol, Flonase, Mucinex.  Discussed alarm symptoms that would warrant emergent evaluation.  Strict return precautions given to which he expressed understanding.  Final Clinical Impressions(s) / UC Diagnoses   Final diagnoses:  Upper respiratory tract infection, unspecified type  Acute cough  Nasal congestion  Sore throat  Nausea and vomiting, unspecified vomiting type     Discharge Instructions      Your flu test was negative.  We will contact you if your COVID test is positive.  Feel free to contact our office if you are interested in getting this test result since we do not typically call when it is negative.  Go home and rest.  Alternate Tylenol and ibuprofen for fever and pain.  Use over-the-counter medications for congestion and cough.  I have called in Zofran to be taken up to 3 times a day as needed.  Make sure that you are able to eat and drink plenty of fluid; if you have any concern for dehydration or unable to keep food/liquids down you need to be seen immediately.  If you have any worsening symptoms including chest pain, high fever, persistent nausea/vomiting interfering with oral intake, shortness of breath, weakness you need to go to the emergency room.     ED Prescriptions     Medication Sig Dispense Auth. Provider   ondansetron (ZOFRAN ODT) 4 MG disintegrating tablet Take 1 tablet (4 mg total) by mouth every 8 (eight) hours as needed for nausea or vomiting. 20 tablet Idabelle Mcpeters, Derry Skill, PA-C      PDMP not reviewed this encounter.   Terrilee Croak, PA-C 10/13/20 [id number]

## 2020-10-14 ENCOUNTER — Other Ambulatory Visit: Payer: Self-pay | Admitting: Gastroenterology

## 2020-10-14 DIAGNOSIS — R197 Diarrhea, unspecified: Secondary | ICD-10-CM

## 2020-10-14 DIAGNOSIS — K5080 Crohn's disease of both small and large intestine without complications: Secondary | ICD-10-CM

## 2020-10-19 ENCOUNTER — Encounter: Payer: Self-pay | Admitting: Psychiatry

## 2020-10-19 ENCOUNTER — Ambulatory Visit (INDEPENDENT_AMBULATORY_CARE_PROVIDER_SITE_OTHER): Payer: 59 | Admitting: Psychiatry

## 2020-10-19 ENCOUNTER — Other Ambulatory Visit: Payer: Self-pay

## 2020-10-19 VITALS — BP 128/74 | HR 94

## 2020-10-19 DIAGNOSIS — F423 Hoarding disorder: Secondary | ICD-10-CM

## 2020-10-19 DIAGNOSIS — F4010 Social phobia, unspecified: Secondary | ICD-10-CM

## 2020-10-19 DIAGNOSIS — G3184 Mild cognitive impairment, so stated: Secondary | ICD-10-CM

## 2020-10-19 DIAGNOSIS — F422 Mixed obsessional thoughts and acts: Secondary | ICD-10-CM | POA: Diagnosis not present

## 2020-10-19 DIAGNOSIS — F3341 Major depressive disorder, recurrent, in partial remission: Secondary | ICD-10-CM

## 2020-10-19 DIAGNOSIS — E871 Hypo-osmolality and hyponatremia: Secondary | ICD-10-CM

## 2020-10-19 NOTE — Progress Notes (Signed)
Zachary [name] [id number] Apr 14, 1960 60 y.o.  Subjective:   Patient ID:  Zachary [name] is a 60 y.o. (DOB 07/09/1960) male. 60 y.o. (60 y.o.?)  Chief Complaint:  Chief Complaint  Patient presents with   Follow-up   Depression   Other    Hyponatremia with MS changes   Anxiety   Memory Loss    Depression        Associated symptoms include decreased concentration.  Associated symptoms include no suicidal ideas.  Past medical history includes anxiety.   Anxiety Symptoms include decreased concentration and nervous/anxious behavior. Patient reports no confusion, dizziness, nausea or suicidal ideas.    Zachary [name] presents to the office today for follow-up of depression, obsessive anxiety and paranoia about body odor and getting dementia.  Did feel somewhat reassured about the fact that Dr. Marcos Eke said he does not have Alzheimer's dx.  visit October 16, 2018.  Initiated a trial of naltrexone off label 50 mg daily for impulse control purchasing.  01/16/2019 appointment with the following noted No benefit naltrexone.  Doesn't like his job but fears change in jobs bc "of the farting" and doesn't feel he'd be accepted in other places.  In this job for 16 years without a promotion.  There have bbeen layoffs and he hasn't gotten layoffs.   The same overall with mental health issues.  Nice new boss.   Worries over his thoughts of patient.  Mood stable.   Continued concerns over spending on things he doesn't need or even really want.  Books he'll never read or DVDs he'll never watch. Huge problem buying too much off the Internet.  Blew check from incentive on impulse purchase from the Internet.  Books and collectibles per usual.  Running up debt.  No other impulsivity.   Not hyper nor otherwise manic.    No history of bankruptcy.  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  Maybe out of boredom.  Needs to stop and can't control himself.Has not talked to anyone about it.  Doesn't get anxious around the  spending.  Not depressed.  Busy at work.  Sleep fine.  Sometimes anxious about passing gas at work and not otherwise anxious there.  Rarely used Xanax.   Depression has remained under control. Overall he thinks the depression and anxiety levels have been "fine".  Friends notice his comprehension problems. Not sure he trusts the neuropsychological testing which was normal and did not show dementia as he fears.  Plan: No meds changed.  07/23/2019 appointment with the following noted: Concerns over concentration and memory.  Thinks he's having trouble with word-finding.  Bosses satisfied with work response and function. Plan: Check B12 and folated DT cognitive complaints. Trial reduction in olanzapine to see if cognition is better to 15 mg daily.  11/25/2019 appointment with the following noted: Thinks he had a lot of olanzapine 20 and kept taking it and never reduced it yet. No mental health changes since here.  Work very busy.  Regular anxiety thing but no depression.  Obsesses over farting and bothering people. Plan: Trial reduction in olanzapine to see if cognition is better to 15 mg daily.  03/22/2020 appointment with following noted: Might be a little better with cognition with the reduction in olanzapine.  No worse anxiety, fear, depression.  Worry is not worse.  Still sleeps well.   Overall things are fine in his mental health and function.  Still working.  Tired of the job and considering job at [employer].  Got confused by  the online app and dropped it.  Work pretty busy.  Employee situation stable.   Plan: Trial reduction in olanzapine to see if cognition is better to 15 mg daily may be succcessful and nothing is worse so no change in dose..  08/26/20 TC:  Pt did not give me any info other thren he can't concentrate enough to count to 30.I asked him did he make any med changes lately and he said he stopped taking meds for incontinence but that's all.I will have admins add him to the cancellation  list,but he wants to know if anything can be done about the concentration.  09/22/20  appt noted: Hard time at work.  More trouble with concentration and memory.  Seemed worse and went to PCP and lab work up.  Normal B12, TSH, CBC, RPR. Head MRI mild atrophy. 08/30/20  Na 128 Was told to discuss psych meds and low sodium.   Sunday had a few hours of giddiness and then again yesterday and doesn't remember feeling that good as an adult.  Had not changed meds or used drugs.   Plan: Reduce fluvoxamine to 3 at night Stop lithium Add salt tablet 1 twice daily with food Repeat sodium level in 1 week. Sent message to patient's primary care physician regarding the possibility of stopping hydrochlorothiazide which could be contributing to the low sodium.  10/19/2020 appointment with the following noted: Just got sodium yesterday so hasn't repeated level.  HCTZ stopped. Didn't stop lithium. Says he still has concentration problems.  Had a hard time counting to 50.   Still working and goes slow but getting things done eventually.    Patient denies difficulty with sleep initiation or maintenance over 8 hours. Denies appetite disturbance.  Patient reports that energy and motivation have been good.  Patient denies any difficulty with concentration.  Patient denies any suicidal ideation.  Good interest and enjoyment.  F has unknown cancer in chemo in early 52's.   Talk every other day.   Past psych med: Abilify, Zyprexa, Seroquel 600, olanzapine 30 Wellbutrin, nortriptyline, clomipramine, mirtazapine,  fluvoxamine 400, Lamotrigine, lithium no response,  stimulants, Aricept  buspirone 60,   Xanax Naltrexone for compulsive buying NR  Review of Systems:  Review of Systems  Respiratory:  Negative for cough.   Gastrointestinal:  Positive for diarrhea. Negative for abdominal distention, nausea and vomiting.  Musculoskeletal:  Positive for back pain.  Neurological:  Negative for dizziness, tremors and  weakness.  Psychiatric/Behavioral:  Positive for decreased concentration. Negative for agitation, behavioral problems, confusion, dysphoric mood, hallucinations, self-injury, sleep disturbance and suicidal ideas. The patient is nervous/anxious. The patient is not hyperactive.    Medications: I have reviewed the patient's current medications.  Current Outpatient Medications  Medication Sig Dispense Refill   ALPRAZolam (XANAX) 0.5 MG tablet Take 1 tablet (0.5 mg total) by mouth daily as needed. 30 tablet 2   aspirin 81 MG EC tablet Take 81 mg by mouth daily. Swallow whole.     budesonide (ENTOCORT EC) 3 MG 24 hr capsule TAKE 3 CAPSULES BY MOUTH EVERY DAY 90 capsule 5   busPIRone (BUSPAR) 30 MG tablet Take 1 tablet (30 mg total) by mouth 2 (two) times daily. 180 tablet 1   donepezil (ARICEPT) 10 MG tablet TAKE 1 TABLET(10 MG) BY MOUTH AT BEDTIME 90 tablet 1   fluvoxaMINE (LUVOX) 100 MG tablet TAKE 1 TABLET BY MOUTH EVERY MORNING THEN TAKE 3 TABLETS BY MOUTH EVERY EVENING (Patient taking differently: 2 in AM and  1 in PM) 360 tablet 1   lamoTRIgine (LAMICTAL) 200 MG tablet Take 1 tablet (200 mg total) by mouth daily. 90 tablet 1   levothyroxine (SYNTHROID) 75 MCG tablet TAKE 1 TABLET(75 MCG) BY MOUTH DAILY 90 tablet 3   lipase/protease/amylase (CREON) 36000 UNITS CPEP capsule Take 4  by mouth before each meal and two with each snack 1440 capsule 3   lithium carbonate 150 MG capsule Take 1 capsule (150 mg total) by mouth daily. 90 capsule 3   losartan-hydrochlorothiazide (HYZAAR) 100-12.5 MG tablet TAKE 1 TABLET BY MOUTH DAILY 90 tablet 3   mesalamine (APRISO) 0.375 g 24 hr capsule Take 4 capsules (1.5 g total) by mouth daily. 120 capsule 11   methylphenidate (RITALIN) 20 MG tablet Take 1 tablet (20 mg total) by mouth 2 (two) times daily. 60 tablet 0   Multiple Vitamin (MULTIVITAMIN WITH MINERALS) TABS Take 1 tablet by mouth daily.     OLANZapine (ZYPREXA) 15 MG tablet TAKE 1 TABLET(15 MG) BY MOUTH AT  BEDTIME 90 tablet 0   ondansetron (ZOFRAN ODT) 4 MG disintegrating tablet Take 1 tablet (4 mg total) by mouth every 8 (eight) hours as needed for nausea or vomiting. 20 tablet 0   sodium chloride 1 g tablet TAKE 1 TABLET(1 GRAM) BY MOUTH THREE TIMES DAILY 290 tablet 0   No current facility-administered medications for this visit.    Medication Side Effects: None  Allergies:  Allergies  Allergen Reactions   Erythromycin     Pt unsure, reports he recently received a -mycin drug with no reaction.    Penicillins     REACTION: "it may kill me"   Tetracyclines & Related Hives    Past Medical History:  Diagnosis Date   Allergy    Anxiety    Crohn disease ([place])    pt reports subsequent testing was normal   Depression    GERD (gastroesophageal reflux disease)    Herpes simplex    Hyperlipidemia    Hypertension    IBS (irritable bowel syndrome)    Incontinence    Irregular heart beat     Family History  Problem Relation Age of Onset   Arthritis Mother    Stroke Mother    Depression Mother    Hypertension Father    Prostate cancer Father 50   Cancer Maternal Aunt    Prostate cancer Maternal Uncle    Breast cancer Maternal Grandmother    Cancer Maternal Grandfather    Cancer Paternal Grandmother    Prostate cancer Paternal Uncle    Colon cancer Neg Hx    Rectal cancer Neg Hx    Stomach cancer Neg Hx    Esophageal cancer Neg Hx    Pancreatic cancer Neg Hx     Social History   Socioeconomic History   Marital status: Single    Spouse name: Not on file   Number of children: Not on file   Years of education: Not on file   Highest education level: Not on file  Occupational History   Not on file  Tobacco Use   Smoking status: Former    Types: Cigarettes    Quit date: 1980    Years since quitting: 42.8   Smokeless tobacco: Never  Vaping Use   Vaping Use: Never used  Substance and Sexual Activity   Alcohol use: No   Drug use: No   Sexual activity: Not Currently   Other Topics Concern   Not on file  Social History  Narrative   Not on file   Social Determinants of Health   Financial Resource Strain: Not on file  Food Insecurity: Not on file  Transportation Needs: Not on file  Physical Activity: Not on file  Stress: Not on file  Social Connections: Not on file  Intimate Partner Violence: Not on file    Past Medical History, Surgical history, Social history, and Family history were reviewed and updated as appropriate.   Please see review of systems for further details on the patient's review from today.   Objective:   Physical Exam:  BP 128/74   Pulse 94   Physical Exam Constitutional:      General: He is not in acute distress.    Appearance: He is well-developed.  Musculoskeletal:        General: No deformity.  Neurological:     Mental Status: He is alert and oriented to person, place, and time.     Motor: No tremor.     Coordination: Coordination normal. Heel to Shin Test abnormal.     Comments: Fair heel to toe gait  Psychiatric:        Attention and Perception: Attention normal. He is attentive.        Mood and Affect: Mood is not anxious or depressed. Affect is blunt. Affect is not labile, angry or inappropriate.        Speech: Speech is not rapid and pressured, delayed or slurred.        Behavior: Behavior normal. Behavior is not agitated or hyperactive.        Thought Content: Thought content is paranoid. Thought content does not include homicidal or suicidal ideation. Thought content does not include homicidal or suicidal plan.        Cognition and Memory: Cognition normal.        Judgment: Judgment is not inappropriate.     Comments: Insight is fair. Chronic obs/paranoia about passing gas at work is not mentioned today with other concerns predominating. Immed mem 3/3 A& O x 3 93,86,79,73, 63 DLROW Recall 3/3    Lab Review:     Component Value Date/Time   NA 128 (L) 08/30/2020 1325   K 5.1 08/30/2020 1325   CL  91 (L) 08/30/2020 1325   CO2 22 08/30/2020 1325   GLUCOSE 83 08/30/2020 1325   GLUCOSE 87 03/04/2020 1941   BUN 12 08/30/2020 1325   CREATININE 1.26 08/30/2020 1325   CREATININE 0.91 07/30/2013 0913   CALCIUM 9.3 08/30/2020 1325   PROT 6.5 08/30/2020 1325   ALBUMIN 4.4 08/30/2020 1325   AST 17 08/30/2020 1325   ALT 16 08/30/2020 1325   ALKPHOS 88 08/30/2020 1325   BILITOT 0.5 08/30/2020 1325   GFRNONAA >60 03/04/2020 1941   GFRAA 90 09/11/2018 1120       Component Value Date/Time   WBC 8.7 08/30/2020 1325   WBC 9.2 03/04/2020 1941   RBC 4.46 08/30/2020 1325   RBC 4.26 03/04/2020 1941   HGB 13.9 08/30/2020 1325   HCT 40.6 08/30/2020 1325   PLT 341 08/30/2020 1325   MCV 91 08/30/2020 1325   MCH 31.2 08/30/2020 1325   MCH 32.2 03/04/2020 1941   MCHC 34.2 08/30/2020 1325   MCHC 35.1 03/04/2020 1941   RDW 12.1 08/30/2020 1325   LYMPHSABS 1.6 08/30/2020 1325   MONOABS 1.0 04/24/2019 1053   EOSABS 0.3 08/30/2020 1325   BASOSABS 0.1 08/30/2020 1325    Lithium Lvl  Date Value Ref Range Status  04/18/2018 <0.3 (L) 0.6 - 1.2 mmol/L Final    Comment:    Verified by repeat analysis. Marland Kitchen      10/26/2019 B12 680  No results found for: PHENYTOIN, PHENOBARB, VALPROATE, CBMZ   .res Assessment: Plan:    Lige was seen today for follow-up, depression, other, anxiety and memory loss.  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  Specifically he is chronically  suspicious that he has dementia despite neuropsychological testing which tells him he does not.  He also has chronic thoughts that other  people find his odor offensive because of passing gas.  There is very little evidence to support this.  He has been under my psychiatric care for many many years and never owed noticed body odor and appointments at any time.  He also suspects that people are thinking negatively of him regularly.  Overall his paranoia and anxiety and depression are unchanged with reduction in olanzapine to 3m daily months ago .    Current cog with minor subtraction problems otherwise OK  Disc recentPatient had a recent episode of hyponatremia with some mental status changes.  The mental status changes have resolved.  We discussed the possibility that virtually every psychiatric medicine can cause low sodium as can hydrochlorothiazide.  Typically SSRIs are the most common cause.  We discussed the risk of reducing his psychiatric medication.  However the hyponatremia must be addressed. HCTZ was stopped, luvox reduced to 300 and salt added yesterday Repeat sodium in 1 week.  Discussed potential metabolic side effects associated with atypical antipsychotics, as well as potential risk for movement side effects. Advised pt to contact office if movement side effects occur.  No evidence of movement disorder. Still obsessing on farting and thinking others notice but it's never been noticeable in years of seeing him and he never gets complaints from other about it.  Discussed the risk of polypharmacy but again if appears medically necessary, helpful and well tolerated.  Continiue fluvoxamine to 3042mdaily Continue per his request lithium 150 Continue salt tablet 1 twice daily with food Repeat sodium level in 1 week. Sent message to patient's primary care physician regarding the possibility of stopping hydrochlorothiazide which could be contributing to the low sodium and he stopped it.  Call if worsening sx  FU 4 weeks  CaLynder ParentsMD, DFAPA  Please see After Visit Summary for patient specific  instructions.  Future Appointments  Date Time Provider [place]11/04/2020 10:50 AM StLadene ArtistMD [facility]  8:30 AM LaDenita LungMD [facility]    No orders of the defined types were placed in this encounter.      -------------------------------

## 2020-10-28 LAB — BASIC METABOLIC PANEL
BUN: 12 mg/dL (ref 7–25)
CO2: 28 mmol/L (ref 20–32)
Calcium: 9.7 mg/dL (ref 8.6–10.3)
Chloride: 100 mmol/L (ref 98–110)
Creat: 1.13 mg/dL (ref 0.70–1.30)
Glucose, Bld: 103 mg/dL (ref 65–139)
Potassium: 5.1 mmol/L (ref 3.5–5.3)
Sodium: 137 mmol/L (ref 135–146)

## 2020-11-04 ENCOUNTER — Encounter: Payer: Self-pay | Admitting: Gastroenterology

## 2020-11-04 ENCOUNTER — Ambulatory Visit: Payer: 59 | Admitting: Gastroenterology

## 2020-11-04 VITALS — BP 132/72 | HR 80 | Ht 73.0 in | Wt 234.0 lb

## 2020-11-04 DIAGNOSIS — K8689 Other specified diseases of pancreas: Secondary | ICD-10-CM | POA: Diagnosis not present

## 2020-11-04 DIAGNOSIS — K50818 Crohn's disease of both small and large intestine with other complication: Secondary | ICD-10-CM

## 2020-11-04 NOTE — Patient Instructions (Signed)
Start your Apriso 4 tablets by mouth every morning.  Thank you for choosing me and [facility].  Pricilla Riffle. Dagoberto Ligas., MD., Marval Regal

## 2020-11-04 NOTE — Progress Notes (Signed)
    History of Present Illness: This is a 60 year old male returning for follow-up of Crohn's ileocolitis and pancreatic insufficiency.  Due to some difficulties in determining mesalamine coverage she is not yet started on mesalamine.  It was determined that Apriso was best covered by his insurance plan however he wanted to further discuss this before starting Apriso.  He states he still has malodorous stools, loose stools and some fecal leakage however the symptoms have improved with the correct use of pancreatic enzymes.   Current Medications, Allergies, Past Medical History, Past Surgical History, Family History and Social History were reviewed in [employer] record.   Physical Exam: General: Well developed, well nourished, no acute distress Head: Normocephalic and atraumatic Eyes: Sclerae anicteric, EOMI Ears: Normal auditory acuity Mouth: Not examined, mask on during Covid-19 pandemic Lungs: Clear throughout to auscultation Heart: Regular rate and rhythm; no murmurs, rubs or bruits Abdomen: Soft, non tender and non distended. No masses, hepatosplenomegaly or hernias noted. Normal Bowel sounds Rectal: Not done Musculoskeletal: Symmetrical with no gross deformities  Pulses:  Normal pulses noted Extremities: No clubbing, cyanosis, edema or deformities noted Neurological: Alert oriented x 4, grossly nonfocal Psychological:  Alert and cooperative. Normal mood and affect   Assessment and Recommendations:  Crohn's ileocolitis, mildly active.  Begin Apriso 1.5 mg p.o. every morning.  Continue budesonide 9 mg daily.  We will attempt to discontinue budesonide at his return visit if his symptoms are under good control on mesalamine alone.  We discussed the medication, its mechanism of action and expected response over several weeks. REV in2 months.  Pancreatis insuffiency.  Continue a fat modified diet long-term.  Continue Creon 36,000 units for with meals and 2 with  snacks. REV in 2 months.

## 2020-11-11 NOTE — Progress Notes (Signed)
Please let him know that his low sodium level has resolved.  I would suggest that if he is taking the salt tablets that he continue them for now.  If he is not taking the salt tablets he does not have to start taking them.  The med changes have apparently addressed the problem with his low sodium.  No further med changes are necessary.

## 2020-11-14 ENCOUNTER — Telehealth: Payer: Self-pay | Admitting: Psychiatry

## 2020-11-14 ENCOUNTER — Other Ambulatory Visit: Payer: Self-pay | Admitting: Gastroenterology

## 2020-11-14 NOTE — Telephone Encounter (Signed)
Next visit is 12/29/20. Zachary [name] called asking about his test results. His phone number is [phone]. Per [name] he is giving permission to leave the test results on his voice mail message. I don't see the results in My Chart yet.

## 2020-11-14 NOTE — Telephone Encounter (Signed)
LVM to rtc 

## 2020-11-14 NOTE — Progress Notes (Signed)
LVM to rtc 

## 2020-11-16 NOTE — Telephone Encounter (Signed)
Im not sure why he is so hard to reach but I have been calling him everyday,multiple times and will keep trying.Just FYI

## 2020-11-16 NOTE — Telephone Encounter (Signed)
He has a recent history of low sodium.  Tell him his sodium level is totally normal now at 137.  He can probably drop to 1 salt tablet daily at this point.  No further med changes are necessary.

## 2020-11-17 NOTE — Progress Notes (Signed)
Pt informed

## 2020-11-17 NOTE — Telephone Encounter (Signed)
Pt informed

## 2020-11-26 ENCOUNTER — Other Ambulatory Visit: Payer: Self-pay | Admitting: Psychiatry

## 2020-11-26 DIAGNOSIS — G3184 Mild cognitive impairment, so stated: Secondary | ICD-10-CM

## 2020-12-02 ENCOUNTER — Other Ambulatory Visit: Payer: Self-pay

## 2020-12-02 ENCOUNTER — Encounter: Payer: Self-pay | Admitting: Family Medicine

## 2020-12-02 ENCOUNTER — Ambulatory Visit: Payer: 59 | Admitting: Family Medicine

## 2020-12-02 VITALS — BP 140/90 | HR 93 | Temp 96.4°F | Wt 240.2 lb

## 2020-12-02 DIAGNOSIS — I10 Essential (primary) hypertension: Secondary | ICD-10-CM

## 2020-12-02 MED ORDER — LOSARTAN POTASSIUM 50 MG PO TABS: 50.0000 mg | ORAL_TABLET | Freq: Every day | ORAL | 3 refills | Status: DC

## 2020-12-02 NOTE — Progress Notes (Signed)
   Subjective:    Patient ID: Zachary [name], male    DOB: Jul 25, 1960, 60 y.o.   MRN: [id number]  HPI He is here for consult concerning abnormal electrolytes.  He did discuss this with his psychiatrist to readjust his home his medication.  He is also stopped a losartan/HCTZ.  Review of the record indicates he did have hyponatremia and also occasional hyperkalemia.  He also has concerns about stroke.  Apparently his mother has had 3 strokes.   Review of Systems     Objective:   Physical Exam Alert and in no distress.  Blood pressure is recorded.       Assessment & Plan:  Essential hypertension - Plan: losartan (COZAAR) 50 MG tablet I will switch him to plain losartan and see how he tolerates this.  He is to buy blood pressure cuff and bring it in with him in 1 month and then discussed stroke and his need to keep his blood pressure under control. He will also check his record to find out his COVID immunization status.

## 2020-12-15 ENCOUNTER — Other Ambulatory Visit: Payer: Self-pay | Admitting: Family Medicine

## 2020-12-26 ENCOUNTER — Other Ambulatory Visit: Payer: Self-pay | Admitting: Psychiatry

## 2020-12-26 DIAGNOSIS — F3341 Major depressive disorder, recurrent, in partial remission: Secondary | ICD-10-CM

## 2020-12-26 DIAGNOSIS — G3184 Mild cognitive impairment, so stated: Secondary | ICD-10-CM

## 2020-12-29 ENCOUNTER — Other Ambulatory Visit: Payer: Self-pay

## 2020-12-29 ENCOUNTER — Ambulatory Visit: Payer: 59 | Admitting: Psychiatry

## 2020-12-29 ENCOUNTER — Encounter: Payer: Self-pay | Admitting: Psychiatry

## 2020-12-29 DIAGNOSIS — F423 Hoarding disorder: Secondary | ICD-10-CM | POA: Diagnosis not present

## 2020-12-29 DIAGNOSIS — E871 Hypo-osmolality and hyponatremia: Secondary | ICD-10-CM

## 2020-12-29 DIAGNOSIS — F4010 Social phobia, unspecified: Secondary | ICD-10-CM

## 2020-12-29 DIAGNOSIS — F3341 Major depressive disorder, recurrent, in partial remission: Secondary | ICD-10-CM

## 2020-12-29 DIAGNOSIS — G3184 Mild cognitive impairment, so stated: Secondary | ICD-10-CM

## 2020-12-29 DIAGNOSIS — F422 Mixed obsessional thoughts and acts: Secondary | ICD-10-CM | POA: Diagnosis not present

## 2020-12-29 MED ORDER — OLANZAPINE 15 MG PO TABS: 15.0000 mg | ORAL_TABLET | Freq: Every day | ORAL | 0 refills | Status: DC

## 2020-12-29 MED ORDER — SODIUM CHLORIDE 1 G PO TABS: 1.0000 g | ORAL_TABLET | Freq: Every day | ORAL | 1 refills | Status: DC

## 2020-12-29 MED ORDER — DONEPEZIL HCL 10 MG PO TABS: ORAL_TABLET | ORAL | 1 refills | Status: DC

## 2020-12-29 MED ORDER — FLUVOXAMINE MALEATE 100 MG PO TABS: ORAL_TABLET | ORAL | 1 refills | Status: DC

## 2020-12-29 NOTE — Progress Notes (Signed)
Zachary [name] [id number] October 24, 1960 60 y.o.  Subjective:   Patient ID:  Zachary [name] is a 60 y.o. (DOB [birth year]) male.  Chief Complaint:  Chief Complaint  Patient presents with   Follow-up    Mixed obsessional thoughts and acts   Depression   Medication Problem   Anxiety   Sleeping Problem    Depression        Associated symptoms include decreased concentration.  Associated symptoms include no suicidal ideas.  Past medical history includes anxiety.   Anxiety Symptoms include decreased concentration and nervous/anxious behavior. Patient reports no confusion, dizziness, nausea or suicidal ideas.    Zachary [name] presents to the office today for follow-up of depression, obsessive anxiety and paranoia about body odor and getting dementia.  Did feel somewhat reassured about the fact that Dr. Marcos Eke said he does not have Alzheimer's dx.  visit October 16, 2018.  Initiated a trial of naltrexone off label 50 mg daily for impulse control purchasing.  01/16/2019 appointment with the following noted No benefit naltrexone.  Doesn't like his job but fears change in jobs bc "of the farting" and doesn't feel he'd be accepted in other places.  In this job for 16 years without a promotion.  There have bbeen layoffs and he hasn't gotten layoffs.   The same overall with mental health issues.  Nice new boss.   Worries over his thoughts of patient.  Mood stable.   Continued concerns over spending on things he doesn't need or even really want.  Books he'll never read or DVDs he'll never watch. Huge problem buying too much off the Internet.  Blew check from incentive on impulse purchase from the Internet.  Books and collectibles per usual.  Running up debt.  No other impulsivity.   Not hyper nor otherwise manic.    No history of bankruptcy.  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  Maybe out of boredom.  Needs to stop and can't control himself.Has not talked to anyone about it.  Doesn't get  anxious around the spending.  Not depressed.  Busy at work.  Sleep fine.  Sometimes anxious about passing gas at work and not otherwise anxious there.  Rarely used Xanax.   Depression has remained under control. Overall he thinks the depression and anxiety levels have been "fine".  Friends notice his comprehension problems. Not sure he trusts the neuropsychological testing which was normal and did not show dementia as he fears.  Plan: No meds changed.  07/23/2019 appointment with the following noted: Concerns over concentration and memory.  Thinks he's having trouble with word-finding.  Bosses satisfied with work response and function. Plan: Check B12 and folated DT cognitive complaints. Trial reduction in olanzapine to see if cognition is better to 15 mg daily.  11/25/2019 appointment with the following noted: Thinks he had a lot of olanzapine 20 and kept taking it and never reduced it yet. No mental health changes since here.  Work very busy.  Regular anxiety thing but no depression.  Obsesses over farting and bothering people. Plan: Trial reduction in olanzapine to see if cognition is better to 15 mg daily.  03/22/2020 appointment with following noted: Might be a little better with cognition with the reduction in olanzapine.  No worse anxiety, fear, depression.  Worry is not worse.  Still sleeps well.   Overall things are fine in his mental health and function.  Still working.  Tired of the job and considering job at [employer].  Got  confused by the online app and dropped it.  Work pretty busy.  Employee situation stable.   Plan: Trial reduction in olanzapine to see if cognition is better to 15 mg daily may be succcessful and nothing is worse so no change in dose..  08/26/20 TC:  Pt did not give me any info other thren he can't concentrate enough to count to 30.I asked him did he make any med changes lately and he said he stopped taking meds for incontinence but that's all.I will have admins add him  to the cancellation list,but he wants to know if anything can be done about the concentration.  09/22/20  appt noted: Hard time at work.  More trouble with concentration and memory.  Seemed worse and went to PCP and lab work up.  Normal B12, TSH, CBC, RPR. Head MRI mild atrophy. 08/30/20  Na 128 Was told to discuss psych meds and low sodium.   Sunday had a few hours of giddiness and then again yesterday and doesn't remember feeling that good as an adult.  Had not changed meds or used drugs.   Plan: Reduce fluvoxamine to 3 at night Stop lithium Add salt tablet 1 twice daily with food Repeat sodium level in 1 week. Sent message to patient's primary care physician regarding the possibility of stopping hydrochlorothiazide which could be contributing to the low sodium.  10/19/2020 appointment with the following noted: Just got sodium yesterday so hasn't repeated level.  HCTZ stopped. Didn't stop lithium. Says he still has concentration problems.  Had a hard time counting to 50.   Still working and goes slow but getting things done eventually.   Plan: Disc recentPatient had a recent episode of hyponatremia with some mental status changes.  The mental status changes have resolved.  We discussed the possibility that virtually every psychiatric medicine can cause low sodium as can hydrochlorothiazide.  Typically SSRIs are the most common cause.  We discussed the risk of reducing his psychiatric medication.  However the hyponatremia must be addressed. HCTZ was stopped, luvox reduced to 300 and salt added yesterday Repeat sodium in 1 week.  11/16/2020 phone call the patient from MD: He has a recent history of low sodium.  Tell him his sodium level is totally normal now at 137.  He can probably drop to 1 salt tablet daily at this point.  No further med changes are necessary.  12/29/2020 appointment with the following noted: Worrying about hoarding which has gotten really bad in the last few weeks.  Since  father died have access to more money than I need.  Buy more than 1 of the same item: Zachary [name] dolls, [place] and glassware.   Got inheritance trust fund.   On med for Crohn's dz and believes he still smells and worries over it.  Patient denies difficulty with sleep initiation or maintenance over 8 hours. Denies appetite disturbance.  Patient reports that energy and motivation have been good.  Patient denies any difficulty with concentration.  Patient denies any suicidal ideation.  Good interest and enjoyment.  F has unknown cancer in chemo in early 57's.   Talk every other day.   Past psych med: Abilify, Zyprexa, Seroquel 600, olanzapine 30 Wellbutrin, nortriptyline, clomipramine, mirtazapine,  fluvoxamine 400, Lamotrigine, lithium no response,  stimulants, Aricept  buspirone 60,   Xanax Naltrexone for compulsive buying NR  Review of Systems:  Review of Systems  Respiratory:  Negative for cough.   Gastrointestinal:  Positive for diarrhea. Negative for abdominal  distention, nausea and vomiting.  Musculoskeletal:  Positive for back pain.  Neurological:  Negative for dizziness, tremors and weakness.  Psychiatric/Behavioral:  Positive for decreased concentration. Negative for agitation, behavioral problems, confusion, dysphoric mood, hallucinations, self-injury, sleep disturbance and suicidal ideas. The patient is nervous/anxious. The patient is not hyperactive.    Medications: I have reviewed the patient's current medications.  Current Outpatient Medications  Medication Sig Dispense Refill   ALPRAZolam (XANAX) 0.5 MG tablet Take 1 tablet (0.5 mg total) by mouth daily as needed. 30 tablet 2   aspirin 81 MG EC tablet Take 81 mg by mouth daily. Swallow whole.     budesonide (ENTOCORT EC) 3 MG 24 hr capsule TAKE 3 CAPSULES BY MOUTH EVERY DAY 90 capsule 5   busPIRone (BUSPAR) 30 MG tablet Take 1 tablet (30 mg total) by mouth 2 (two) times daily. 180 tablet 1   CREON 36000-114000 units  CPEP capsule TAKE 4 CAPSULES BY MOUTH BEFORE A MEAL& 2 WITH EACH SNACK 1440 capsule 3   lamoTRIgine (LAMICTAL) 200 MG tablet TAKE 1 TABLET(200 MG) BY MOUTH DAILY 90 tablet 1   levothyroxine (SYNTHROID) 75 MCG tablet TAKE 1 TABLET(75 MCG) BY MOUTH DAILY 90 tablet 0   lithium carbonate 150 MG capsule TAKE 1 CAPSULE(150 MG) BY MOUTH DAILY 90 capsule 0   losartan (COZAAR) 50 MG tablet Take 1 tablet (50 mg total) by mouth daily. 90 tablet 3   Multiple Vitamin (MULTIVITAMIN WITH MINERALS) TABS Take 1 tablet by mouth daily.     ondansetron (ZOFRAN ODT) 4 MG disintegrating tablet Take 1 tablet (4 mg total) by mouth every 8 (eight) hours as needed for nausea or vomiting. 20 tablet 0   donepezil (ARICEPT) 10 MG tablet TAKE 1 TABLET(10 MG) BY MOUTH AT BEDTIME 90 tablet 1   fluvoxaMINE (LUVOX) 100 MG tablet TAKE 1 TABLET BY MOUTH EVERY MORNING THEN TAKE 3 TABLETS BY MOUTH EVERY EVENING 360 tablet 1   mesalamine (APRISO) 0.375 g 24 hr capsule Take 4 capsules (1.5 g total) by mouth daily. 120 capsule 11   methylphenidate (RITALIN) 20 MG tablet Take 1 tablet (20 mg total) by mouth 2 (two) times daily. (Patient not taking: Reported on 12/29/2020) 60 tablet 0   OLANZapine (ZYPREXA) 15 MG tablet Take 1 tablet (15 mg total) by mouth at bedtime. 90 tablet 0   sodium chloride 1 g tablet Take 1 tablet (1 g total) by mouth daily. 90 tablet 1   No current facility-administered medications for this visit.    Medication Side Effects: None  Allergies:  Allergies  Allergen Reactions   Erythromycin     Pt unsure, reports he recently received a -mycin drug with no reaction.    Penicillins     REACTION: "it may kill me"   Tetracyclines & Related Hives    Past Medical History:  Diagnosis Date   Allergy    Anxiety    Crohn disease (Mark)    pt reports subsequent testing was normal   Depression    GERD (gastroesophageal reflux disease)    Herpes simplex    Hyperlipidemia    Hypertension    IBS (irritable bowel  syndrome)    Incontinence    Irregular heart beat     Family History  Problem Relation Age of Onset   Arthritis Mother    Stroke Mother    Depression Mother    Hypertension Father    Prostate cancer Father 5   Breast cancer Maternal Grandmother  Cancer Maternal Grandfather        unknown type   Cancer Paternal Grandmother        unknown type   Lung cancer Maternal Aunt    Prostate cancer Paternal Uncle    Prostate cancer Paternal Uncle    Colon cancer Neg Hx    Rectal cancer Neg Hx    Stomach cancer Neg Hx    Esophageal cancer Neg Hx    Pancreatic cancer Neg Hx     Social History   Socioeconomic History   Marital status: Single    Spouse name: Not on file   Number of children: Not on file   Years of education: Not on file   Highest education level: Not on file  Occupational History   Not on file  Tobacco Use   Smoking status: Former    Types: Cigarettes    Quit date: 1980    Years since quitting: 43.0   Smokeless tobacco: Never  Vaping Use   Vaping Use: Never used  Substance and Sexual Activity   Alcohol use: No   Drug use: No   Sexual activity: Not Currently  Other Topics Concern   Not on file  Social History Narrative   Not on file   Social Determinants of Health   Financial Resource Strain: Not on file  Food Insecurity: Not on file  Transportation Needs: Not on file  Physical Activity: Not on file  Stress: Not on file  Social Connections: Not on file  Intimate Partner Violence: Not on file    Past Medical History, Surgical history, Social history, and Family history were reviewed and updated as appropriate.   Please see review of systems for further details on the patient's review from today.   Objective:   Physical Exam:  There were no vitals taken for this visit.  Physical Exam Constitutional:      General: He is not in acute distress.    Appearance: He is well-developed.  Musculoskeletal:        General: No deformity.   Neurological:     Mental Status: He is alert and oriented to person, place, and time.     Motor: No tremor.     Coordination: Coordination normal. Heel to Shin Test abnormal.     Comments: Fair heel to toe gait  Psychiatric:        Attention and Perception: Attention normal. He is attentive.        Mood and Affect: Mood is anxious. Mood is not depressed. Affect is blunt. Affect is not labile, angry or inappropriate.        Speech: Speech is not rapid and pressured, delayed or slurred.        Behavior: Behavior normal. Behavior is not agitated or hyperactive.        Thought Content: Thought content is paranoid. Thought content does not include homicidal or suicidal ideation. Thought content does not include homicidal or suicidal plan.        Cognition and Memory: Cognition normal.        Judgment: Judgment is not inappropriate.     Comments: Insight is fair. Chronic obs/paranoia about passing gas at work is not mentioned today with other concerns predominating. I    Lab Review:     Component Value Date/Time   NA 137 10/28/2020 1708   NA 128 (L) 08/30/2020 1325   K 5.1 10/28/2020 1708   CL 100 10/28/2020 1708   CO2 28 10/28/2020 1708  GLUCOSE 103 10/28/2020 1708   BUN 12 10/28/2020 1708   BUN 12 08/30/2020 1325   CREATININE 1.13 10/28/2020 1708   CALCIUM 9.7 10/28/2020 1708   PROT 6.5 08/30/2020 1325   ALBUMIN 4.4 08/30/2020 1325   AST 17 08/30/2020 1325   ALT 16 08/30/2020 1325   ALKPHOS 88 08/30/2020 1325   BILITOT 0.5 08/30/2020 1325   GFRNONAA >60 03/04/2020 1941   GFRAA 90 09/11/2018 1120       Component Value Date/Time   WBC 8.7 08/30/2020 1325   WBC 9.2 03/04/2020 1941   RBC 4.46 08/30/2020 1325   RBC 4.26 03/04/2020 1941   HGB 13.9 08/30/2020 1325   HCT 40.6 08/30/2020 1325   PLT 341 08/30/2020 1325   MCV 91 08/30/2020 1325   MCH 31.2 08/30/2020 1325   MCH 32.2 03/04/2020 1941   MCHC 34.2 08/30/2020 1325   MCHC 35.1 03/04/2020 1941   RDW 12.1 08/30/2020  1325   LYMPHSABS 1.6 08/30/2020 1325   MONOABS 1.0 04/24/2019 1053   EOSABS 0.3 08/30/2020 1325   BASOSABS 0.1 08/30/2020 1325    Lithium Lvl  Date Value Ref Range Status  04/18/2018 <0.3 (L) 0.6 - 1.2 mmol/L Final    Comment:    Verified by repeat analysis. Marland Kitchen      10/26/2019 B12 680  No results found for: PHENYTOIN, PHENOBARB, VALPROATE, CBMZ   .res Assessment: Plan:    Chioke was seen today for follow-up, depression, medication problem, anxiety and sleeping problem.  Diagnoses and all orders for this visit:  Hoarding disorder -     fluvoxaMINE (LUVOX) 100 MG tablet; TAKE 1 TABLET BY MOUTH EVERY MORNING THEN TAKE 3 TABLETS BY MOUTH EVERY EVENING  Mixed obsessional thoughts and acts -     fluvoxaMINE (LUVOX) 100 MG tablet; TAKE 1 TABLET BY MOUTH EVERY MORNING THEN TAKE 3 TABLETS BY MOUTH EVERY EVENING -     OLANZapine (ZYPREXA) 15 MG tablet; Take 1 tablet (15 mg total) by mouth at bedtime.  Social anxiety disorder -     fluvoxaMINE (LUVOX) 100 MG tablet; TAKE 1 TABLET BY MOUTH EVERY MORNING THEN TAKE 3 TABLETS BY MOUTH EVERY EVENING -     OLANZapine (ZYPREXA) 15 MG tablet; Take 1 tablet (15 mg total) by mouth at bedtime.  Hyponatremia -     sodium chloride 1 g tablet; Take 1 tablet (1 g total) by mouth daily.  Recurrent major depression in partial remission (HCC) -     OLANZapine (ZYPREXA) 15 MG tablet; Take 1 tablet (15 mg total) by mouth at bedtime.  Mild cognitive impairment -     donepezil (ARICEPT) 10 MG tablet; TAKE 1 TABLET(10 MG) BY MOUTH AT BEDTIME   [name] has been diagnosed with treatment resistant depression with psychotic features, OCD and social anxiety but schizoaffective may be more appropriate diagnosis given this chronic paranoid thinking.  Specifically he is chronically  suspicious that he has dementia despite neuropsychological testing which tells him he does not.  He also has chronic thoughts that other people find his odor offensive because of  passing gas.  There is very little evidence to support this.  He has been under my psychiatric care for many many years and never owed noticed body odor and appointments at any time.  He also suspects that people are thinking negatively of him regularly.  Overall his paranoia and anxiety and depression are unchanged with reduction in olanzapine to 62m daily months ago .    Current  cog with minor subtraction problems otherwise OK  Disc recentPatient had a recent episode of hyponatremia with some mental status changes.  The mental status changes have resolved.  We discussed the possibility that virtually every psychiatric medicine can cause low sodium as can hydrochlorothiazide.   We discussed the risk of reducing his psychiatric medication.  HCTZ was stopped, luvox reduced to 300 and salt added and sodium normalized. He is seeing his primary care doctor again in about 2 weeks  Hoarding worsened since Luvox reduced to 300 mg daily so increase to 400 mg daily.  Discussed potential metabolic side effects associated with atypical antipsychotics, as well as potential risk for movement side effects. Advised pt to contact office if movement side effects occur.  No evidence of movement disorder. Still obsessing on farting and thinking others notice but it's never been noticeable in years of seeing him and he never gets complaints from other about it.  Discussed the risk of polypharmacy but again if appears medically necessary, helpful and well tolerated.  Continue per his request lithium 150 Continue salt tablet 1 daily with food  FU 8 weeks  Lynder Parents, MD, DFAPA  Please see After Visit Summary for patient specific instructions.  Future Appointments  Date Time Provider [place]  01/16/2021  9:50 AM Ladene Artist, MD [facility]  01/17/2021  9:15 AM Denita Lung, MD [facility] [place]  09/20/2021  8:30 AM Denita Lung, MD [facility]    No orders of the defined types were  placed in this encounter.       -------------------------------

## 2021-01-16 ENCOUNTER — Ambulatory Visit: Payer: 59 | Admitting: Gastroenterology

## 2021-01-16 ENCOUNTER — Encounter: Payer: Self-pay | Admitting: Gastroenterology

## 2021-01-16 VITALS — BP 120/74 | HR 92 | Ht 73.0 in | Wt 249.5 lb

## 2021-01-16 DIAGNOSIS — K8689 Other specified diseases of pancreas: Secondary | ICD-10-CM

## 2021-01-16 DIAGNOSIS — K5080 Crohn's disease of both small and large intestine without complications: Secondary | ICD-10-CM

## 2021-01-16 MED ORDER — MESALAMINE ER 0.375 G PO CP24: 1500.0000 mg | ORAL_CAPSULE | Freq: Every day | ORAL | 11 refills | Status: DC

## 2021-01-16 NOTE — Progress Notes (Signed)
° ° °  History of Present Illness: This is a 61 year old male with a history of Crohn's ileocolitis and pancreatic insufficiency.  He complains of malodorous stools and gas.  His weight has increased from 234 pounds in November to 249 pounds today.  He is not sure he is taking Apriso.  Current Medications, Allergies, Past Medical History, Past Surgical History, Family History and Social History were reviewed in [employer] record.   Physical Exam: General: Well developed, well nourished, no acute distress Head: Normocephalic and atraumatic Eyes: Sclerae anicteric, EOMI Ears: Normal auditory acuity Mouth: Not examined, mask on during Covid-19 pandemic Lungs: Clear throughout to auscultation Heart: Regular rate and rhythm; no murmurs, rubs or bruits Abdomen: Soft, non tender and non distended. No masses, hepatosplenomegaly or hernias noted. Normal Bowel sounds Rectal: Not done Musculoskeletal: Symmetrical with no gross deformities  Pulses:  Normal pulses noted Extremities: No clubbing, cyanosis, edema or deformities noted Neurological: Alert oriented x 4, grossly nonfocal Psychological:  Alert and cooperative. Normal mood and affect   Assessment and Recommendations:  Crohn's ileocolitis, mild activity.  Continue Apriso 1.5 g every morning. REV in 1 year.  Pancreatic insufficiency.  Continue fat modified diet and Creon 36,000 4 with meals and 2 with snacks.  His 15 pound weight gain is strong evidence that his pancreatic insufficiency is well controlled however he needs to lose weight.  Follow-up with PCP.  REV in 1 year. Malodorous gas. Baby wipes post BM. Low gas diet. Gas-x qid prn. Two week trial of 3 different probiotics as listed in AVS.

## 2021-01-16 NOTE — Patient Instructions (Signed)
We have sent the following medications to your pharmacy for you to pick up at your convenience: Apriso.  You have been a gas prevention diet to follow.   Start an over the counter probiotic such as Publishing copy, Electronics engineer or [employer] colon health x 2 weeks.   The [facility] providers would like to encourage you to use [facility] to communicate with providers for non-urgent requests or questions.  Due to long hold times on the telephone, sending your provider a message by [facility] may be a faster and more efficient way to get a response.  Please allow 48 business hours for a response.  Please remember that this is for non-urgent requests.   Thank you for choosing me and [facility].  Pricilla Riffle. Dagoberto Ligas., MD., Marval Regal

## 2021-01-17 ENCOUNTER — Ambulatory Visit: Payer: 59 | Admitting: Family Medicine

## 2021-01-17 ENCOUNTER — Encounter: Payer: Self-pay | Admitting: Family Medicine

## 2021-01-17 VITALS — BP 140/82 | HR 83 | Temp 96.8°F | Wt 249.6 lb

## 2021-01-17 DIAGNOSIS — E039 Hypothyroidism, unspecified: Secondary | ICD-10-CM | POA: Diagnosis not present

## 2021-01-17 DIAGNOSIS — Z23 Encounter for immunization: Secondary | ICD-10-CM

## 2021-01-17 MED ORDER — LEVOTHYROXINE SODIUM 75 MCG PO TABS: ORAL_TABLET | ORAL | 1 refills | Status: DC

## 2021-01-17 NOTE — Progress Notes (Signed)
° °  Subjective:    Patient ID: Zachary [name], male    DOB: Feb 28, 1960, 61 y.o.   MRN: [id number]  HPI He is here for another COVID-vaccine.  I will bring him up-to-date with all that are necessary.  He also would like a refill on his thyroid medication.  He did have a TSH done in August.   Review of Systems     Objective:   Physical Exam  Alert and in no distress otherwise not examined      Assessment & Plan:  Need for COVID-19 vaccine - Plan: Moderna Covid-19 Vaccine Bivalent Booster  Hypothyroidism, unspecified type - Plan: levothyroxine (SYNTHROID) 75 MCG tablet

## 2021-01-23 ENCOUNTER — Encounter: Payer: Self-pay | Admitting: Family Medicine

## 2021-01-23 ENCOUNTER — Other Ambulatory Visit: Payer: Self-pay

## 2021-01-23 ENCOUNTER — Ambulatory Visit: Payer: 59 | Admitting: Family Medicine

## 2021-01-23 VITALS — BP 122/80 | HR 85 | Temp 96.6°F | Wt 256.2 lb

## 2021-01-23 DIAGNOSIS — M79675 Pain in left toe(s): Secondary | ICD-10-CM | POA: Diagnosis not present

## 2021-01-23 NOTE — Progress Notes (Signed)
° °  Subjective:    Patient ID: Zachary [name], male    DOB: October 24, 1960, 61 y.o.   MRN: [id number]  HPI He complains of a several month history of left second toe pain especially if he puts pressure directly over the tip of the toe.  No previous history of injuries to this.  He now was noted over the last day or so some pain over the dorsal foot area that he thinks might be related to this.   Review of Systems     Objective:   Physical Exam Exam of the left foot shows no skin changes.  There is no palpable tenderness to the tip of the toe.  The toenail appears normal.  Full motion of of the toe.       Assessment & Plan:   Pain of toe of left foot - Plan: Ambulatory referral to Podiatry I explained that I was unclear as to exactly what was causing it versus podiatry to get their input into it.

## 2021-01-26 ENCOUNTER — Encounter: Payer: Self-pay | Admitting: Family Medicine

## 2021-01-26 ENCOUNTER — Other Ambulatory Visit: Payer: Self-pay

## 2021-01-26 ENCOUNTER — Ambulatory Visit: Payer: 59 | Admitting: Family Medicine

## 2021-01-26 VITALS — BP 144/100 | HR 88 | Ht 73.0 in | Wt 247.4 lb

## 2021-01-26 DIAGNOSIS — I10 Essential (primary) hypertension: Secondary | ICD-10-CM | POA: Diagnosis not present

## 2021-01-26 DIAGNOSIS — R079 Chest pain, unspecified: Secondary | ICD-10-CM

## 2021-01-26 DIAGNOSIS — K219 Gastro-esophageal reflux disease without esophagitis: Secondary | ICD-10-CM | POA: Diagnosis not present

## 2021-01-26 NOTE — Progress Notes (Signed)
Chief Complaint  Patient presents with   Chest Pain    Right sided chest pain/pressure-steady but is relieved by belching. Started this morning. Not having at the moment.    This morning, while at work, he noticed some pain on the right side of his chest.  He works as a Printmaker at [employer], does some lifting. Currently denies any discomfort. Burping alleviates the discomfort.  He has h/o GERD, was told to take OTC meds, but doesn't, as symptoms are not frequent.  Symptom tends to be vomiting.  He denies any heartburn.  He has been belching a lot today.  Denies change in diet. +caffeine (soda), after he already had symptoms. He tends to have caffeine, citrus, spicy, chocolate in his diet.  He missed his evening meds last night.  He thinks this has happened more than once this week.  He fell asleep on the sofa. He thought the "3 pills he takes at night" were for blood pressure. Meds were reviewed in detail--only BP med is losartan. Most evening meds are psych meds.  He denies feeling particularly anxious today.   He has HTN, and was seen earlier in the week with foot pain, and BP was normal.  BP Readings from Last 3 Encounters:  01/26/21 (!) 144/100  01/23/21 122/80  01/17/21 140/82    ROS: no headache, dizziness.  Foot isn't really bothering him now. Denies feeling anxious. R sided chets pain that he had earlier today has resolved.  Denies n/v/d, edema, rashes or other concerns. See HPI   PHYSICAL EXAM:  BP (!) 144/100 (BP Location: Right Arm, Cuff Size: Normal)    Pulse 88    Ht 6' 1"  (1.854 m)    Wt 247 lb 6.4 oz (112.2 kg)    BMI 32.64 kg/m   160/100 laying down per MD, repeat sitting was 148/98  Well-appearing, overweight male, in no distress HEENT: conjunctiva and sclera are clear, EOMI, wearing mask Neck: no lymphadenopathy, thyromegaly or carotid bruit Heart: regular rate and rhythm, no murmur Lungs: clear bilaterally Chest:  no focal tenderness on exam Abdomen: very  minimal epigastric tenderness. No organomegaly or mass Extremities: no edema Psych: normal mood, affect, grooming Neuro: alert and oriented, strength grossly normal, normal gait  EKG:  NSR, rate 70, incomplete RBBB, unchanged from EKG in epic from 2022.  ASSESSMENT/PLAN:  Chest pain, unspecified type - suspect related to gas/GERD.  Pain-free in visit, EKG unchanged - Plan: EKG 12-Lead  Essential hypertension - BP elevated today.  Some missed doses of meds, encouraged him to double-check if he took losartan today. Was fine earlier in the week  Gastroesophageal reflux disease, unspecified whether esophagitis present - reviewed diet, small meals, waiting 2 hours after eating to lay down. Consider pepcid BID    Try and cut back on caffeine, citrus, tomatoes, chocolate, spicy food. Wait at least 2-3 hours after eating before laying down. You may want to take pepcid twice daily (famotidine) to see if this alleviates some of your belching/heartburn symptoms. Losing weight, and eating small meals (rather than large meals) will also help. Please try and take your medications in the evening before moving to the sofa, to try and not forget your evening medications as often.  Your blood pressure was very high today, while it was fine earlier in the week. Be sure that you have taken your losartan (blood pressure medication) today--if you haven't, take it when you get home.

## 2021-01-26 NOTE — Patient Instructions (Signed)
Try and cut back on caffeine, citrus, tomatoes, chocolate, spicy food. Wait at least 2-3 hours after eating before laying down. You may want to take pepcid twice daily (famotidine) to see if this alleviates some of your belching/heartburn symptoms. Losing weight, and eating small meals (rather than large meals) will also help. Please try and take your medications in the evening before moving to the sofa, to try and not forget your evening medications as often.  Your blood pressure was very high today, while it was fine earlier in the week. Be sure that you have taken your losartan (blood pressure medication) today--if you haven't, take it when you get home.

## 2021-02-03 ENCOUNTER — Ambulatory Visit: Payer: 59 | Admitting: Physician Assistant

## 2021-02-03 ENCOUNTER — Other Ambulatory Visit: Payer: Self-pay

## 2021-02-03 ENCOUNTER — Encounter: Payer: Self-pay | Admitting: Physician Assistant

## 2021-02-03 VITALS — BP 122/88 | HR 88 | Wt 252.0 lb

## 2021-02-03 DIAGNOSIS — E039 Hypothyroidism, unspecified: Secondary | ICD-10-CM | POA: Diagnosis not present

## 2021-02-03 DIAGNOSIS — R5383 Other fatigue: Secondary | ICD-10-CM | POA: Diagnosis not present

## 2021-02-03 DIAGNOSIS — R5381 Other malaise: Secondary | ICD-10-CM

## 2021-02-03 DIAGNOSIS — I10 Essential (primary) hypertension: Secondary | ICD-10-CM

## 2021-02-03 NOTE — Progress Notes (Signed)
Acute Office Visit  Subjective:    Patient ID: Zachary [name], male    DOB: 12/07/1960, 61 y.o.   MRN: [id number]  Chief Complaint  Patient presents with   Hypertension   Hypothyroidism    Hypertension Pertinent negatives include no neck pain or shortness of breath.  Patient is in today for a follow up appointment.   Past Medical History:  Diagnosis Date   Allergy    Anxiety    Crohn disease ([place])    pt reports subsequent testing was normal   Depression    GERD (gastroesophageal reflux disease)    Herpes simplex    Hyperlipidemia    Hypertension    IBS (irritable bowel syndrome)    Incontinence    Irregular heart beat     Past Surgical History:  Procedure Laterality Date   APPENDECTOMY     COLONOSCOPY     POLYPECTOMY     TWISTED TESTICLES  1970   [place]    UMBILICAL HERNIA REPAIR      Family History  Problem Relation Age of Onset   Arthritis Mother    Stroke Mother    Depression Mother    Hypertension Father    Prostate cancer Father 21   Breast cancer Maternal Grandmother    Cancer Maternal Grandfather        unknown type   Cancer Paternal Grandmother        unknown type   Lung cancer Maternal Aunt    Prostate cancer Paternal Uncle    Prostate cancer Paternal Uncle    Colon cancer Neg Hx    Rectal cancer Neg Hx    Stomach cancer Neg Hx    Esophageal cancer Neg Hx    Pancreatic cancer Neg Hx     Social History   Socioeconomic History   Marital status: Single    Spouse name: Not on file   Number of children: Not on file   Years of education: Not on file   Highest education level: Not on file  Occupational History   Not on file  Tobacco Use   Smoking status: Former    Types: Cigarettes    Quit date: 1980    Years since quitting: 43.1   Smokeless tobacco: Never  Vaping Use   Vaping Use: Never used  Substance and Sexual Activity   Alcohol use: No   Drug use: No   Sexual activity: Not Currently  Other Topics Concern   Not on  file  Social History Narrative   Not on file   Social Determinants of Health   Financial Resource Strain: Not on file  Food Insecurity: Not on file  Transportation Needs: Not on file  Physical Activity: Not on file  Stress: Not on file  Social Connections: Not on file  Intimate Partner Violence: Not on file    Outpatient Medications Prior to Visit  Medication Sig Dispense Refill   aspirin 81 MG EC tablet Take 81 mg by mouth daily. Swallow whole.     budesonide (ENTOCORT EC) 3 MG 24 hr capsule TAKE 3 CAPSULES BY MOUTH EVERY DAY 90 capsule 5   busPIRone (BUSPAR) 30 MG tablet Take 1 tablet (30 mg total) by mouth 2 (two) times daily. 180 tablet 1   CREON 36000-114000 units CPEP capsule TAKE 4 CAPSULES BY MOUTH BEFORE A MEAL& 2 WITH EACH SNACK 1440 capsule 3   lamoTRIgine (LAMICTAL) 200 MG tablet TAKE 1 TABLET(200 MG) BY MOUTH DAILY 90 tablet 1  levothyroxine (SYNTHROID) 75 MCG tablet TAKE 1 TABLET(75 MCG) BY MOUTH DAILY 90 tablet 1   lithium carbonate 150 MG capsule TAKE 1 CAPSULE(150 MG) BY MOUTH DAILY 90 capsule 0   losartan (COZAAR) 50 MG tablet Take 1 tablet (50 mg total) by mouth daily. 90 tablet 3   mesalamine (APRISO) 0.375 g 24 hr capsule Take 4 capsules (1.5 g total) by mouth daily. 120 capsule 11   Multiple Vitamin (MULTIVITAMIN WITH MINERALS) TABS Take 1 tablet by mouth daily.     sodium chloride 1 g tablet Take 1 tablet (1 g total) by mouth daily. 90 tablet 1   ALPRAZolam (XANAX) 0.5 MG tablet Take 1 tablet (0.5 mg total) by mouth daily as needed. (Patient not taking: Reported on 01/26/2021) 30 tablet 2   donepezil (ARICEPT) 10 MG tablet TAKE 1 TABLET(10 MG) BY MOUTH AT BEDTIME (Patient not taking: Reported on 01/26/2021) 90 tablet 1   fluvoxaMINE (LUVOX) 100 MG tablet TAKE 1 TABLET BY MOUTH EVERY MORNING THEN TAKE 3 TABLETS BY MOUTH EVERY EVENING (Patient not taking: Reported on 01/26/2021) 360 tablet 1   OLANZapine (ZYPREXA) 15 MG tablet Take 1 tablet (15 mg total) by mouth at  bedtime. (Patient not taking: Reported on 01/26/2021) 90 tablet 0   No facility-administered medications prior to visit.    Allergies  Allergen Reactions   Erythromycin     Pt unsure, reports he recently received a -mycin drug with no reaction.    Penicillins     REACTION: "it may kill me"   Tetracyclines & Related Hives    Review of Systems  Constitutional:  Negative for activity change, chills, fatigue and fever.  HENT:  Negative for congestion, ear pain, hearing loss and voice change.   Eyes:  Negative for pain and redness.  Respiratory:  Negative for cough and shortness of breath.   Cardiovascular:  Negative for leg swelling.  Gastrointestinal:  Negative for constipation, diarrhea, nausea and vomiting.  Endocrine: Negative for polyuria.  Genitourinary:  Negative for flank pain and frequency.  Musculoskeletal:  Negative for joint swelling and neck pain.  Skin:  Negative for rash.  Neurological:  Negative for dizziness.  Hematological:  Does not bruise/bleed easily.  Psychiatric/Behavioral:  Negative for agitation and behavioral problems.       Objective:    Physical Exam Constitutional:      General: He is not in acute distress.    Appearance: Normal appearance.  HENT:     Head: Normocephalic and atraumatic.     Right Ear: External ear normal.     Left Ear: External ear normal.     Nose: No congestion.  Eyes:     Extraocular Movements: Extraocular movements intact.     Conjunctiva/sclera: Conjunctivae normal.     Pupils: Pupils are equal, round, and reactive to light.  Cardiovascular:     Rate and Rhythm: Normal rate and regular rhythm.     Pulses: Normal pulses.     Heart sounds: Normal heart sounds.  Pulmonary:     Effort: Pulmonary effort is normal.     Breath sounds: Normal breath sounds. No wheezing.  Abdominal:     General: Bowel sounds are normal.     Palpations: Abdomen is soft.  Musculoskeletal:        General: Normal range of motion.     Cervical  back: Normal range of motion and neck supple.     Right lower leg: No edema.     Left lower leg:  No edema.  Skin:    General: Skin is warm and dry.     Findings: No rash.  Neurological:     Mental Status: He is alert and oriented to person, place, and time.     Gait: Gait normal.  Psychiatric:        Mood and Affect: Mood normal.        Behavior: Behavior normal.    BP 122/88 (BP Location: Right Arm, Patient Position: Sitting)    Pulse 88    Wt 252 lb (114.3 kg)    SpO2 95%    BMI 33.25 kg/m  Wt Readings from Last 3 Encounters:  02/03/21 252 lb (114.3 kg)  01/26/21 247 lb 6.4 oz (112.2 kg)  01/23/21 256 lb 3.2 oz (116.2 kg)    There are no preventive care reminders to display for this patient.  There are no preventive care reminders to display for this patient.   Lab Results  Component Value Date   TSH 0.756 02/03/2021   Lab Results  Component Value Date   WBC 8.7 08/30/2020   HGB 13.9 08/30/2020   HCT 40.6 08/30/2020   MCV 91 08/30/2020   PLT 341 08/30/2020   Lab Results  Component Value Date   NA 137 10/28/2020   K 5.1 10/28/2020   CO2 28 10/28/2020   GLUCOSE 103 10/28/2020   BUN 12 10/28/2020   CREATININE 1.13 10/28/2020   BILITOT 0.5 08/30/2020   ALKPHOS 88 08/30/2020   AST 17 08/30/2020   ALT 16 08/30/2020   PROT 6.5 08/30/2020   ALBUMIN 4.4 08/30/2020   CALCIUM 9.7 10/28/2020   ANIONGAP 10 03/04/2020   EGFR 66 08/30/2020   GFR 62.95 04/24/2019   Lab Results  Component Value Date   CHOL 186 07/16/2018   Lab Results  Component Value Date   HDL 53 07/16/2018   Lab Results  Component Value Date   LDLCALC 101 (H) 07/16/2018   Lab Results  Component Value Date   TRIG 161 (H) 07/16/2018   Lab Results  Component Value Date   CHOLHDL 3.5 07/16/2018   No results found for: HGBA1C     Assessment & Plan:   Problem List Items Addressed This Visit       Cardiovascular and Mediastinum   Essential hypertension     Endocrine    Hypothyroidism - Primary   Relevant Orders   TSH + free T4 (Completed)   Other Visit Diagnoses     Malaise and fatigue       Relevant Orders   TSH + free T4 (Completed)        No orders of the defined types were placed in this encounter.    Irene Pap, PA-C

## 2021-02-04 LAB — TSH+FREE T4
Free T4: 1.32 ng/dL (ref 0.82–1.77)
TSH: 0.756 u[IU]/mL (ref 0.450–4.500)

## 2021-02-07 NOTE — Progress Notes (Signed)
Please call patient to tell him that his thyroid is balanced.

## 2021-02-09 ENCOUNTER — Encounter: Payer: Self-pay | Admitting: Physician Assistant

## 2021-03-01 ENCOUNTER — Ambulatory Visit: Payer: 59 | Admitting: Psychiatry

## 2021-03-11 ENCOUNTER — Other Ambulatory Visit: Payer: Self-pay | Admitting: Psychiatry

## 2021-03-11 DIAGNOSIS — E871 Hypo-osmolality and hyponatremia: Secondary | ICD-10-CM

## 2021-03-13 ENCOUNTER — Other Ambulatory Visit: Payer: Self-pay | Admitting: Family Medicine

## 2021-03-13 DIAGNOSIS — E039 Hypothyroidism, unspecified: Secondary | ICD-10-CM

## 2021-03-22 ENCOUNTER — Other Ambulatory Visit: Payer: Self-pay | Admitting: Psychiatry

## 2021-03-22 DIAGNOSIS — F3341 Major depressive disorder, recurrent, in partial remission: Secondary | ICD-10-CM

## 2021-03-22 DIAGNOSIS — F422 Mixed obsessional thoughts and acts: Secondary | ICD-10-CM

## 2021-03-22 DIAGNOSIS — F4010 Social phobia, unspecified: Secondary | ICD-10-CM

## 2021-03-26 ENCOUNTER — Other Ambulatory Visit: Payer: Self-pay | Admitting: Psychiatry

## 2021-03-26 DIAGNOSIS — G3184 Mild cognitive impairment, so stated: Secondary | ICD-10-CM

## 2021-04-11 ENCOUNTER — Other Ambulatory Visit: Payer: Self-pay | Admitting: Gastroenterology

## 2021-04-11 DIAGNOSIS — K5080 Crohn's disease of both small and large intestine without complications: Secondary | ICD-10-CM

## 2021-04-11 DIAGNOSIS — R197 Diarrhea, unspecified: Secondary | ICD-10-CM

## 2021-04-13 ENCOUNTER — Ambulatory Visit: Payer: 59 | Admitting: Family Medicine

## 2021-04-13 VITALS — Temp 97.3°F | Wt 254.1 lb

## 2021-04-13 DIAGNOSIS — R42 Dizziness and giddiness: Secondary | ICD-10-CM

## 2021-04-13 DIAGNOSIS — R079 Chest pain, unspecified: Secondary | ICD-10-CM | POA: Diagnosis not present

## 2021-04-13 NOTE — Progress Notes (Signed)
? ?  Subjective:  ? ? Patient ID: Zachary [name], male    DOB: [birth year], 61 y.o.   MRN: [id number] ? ?HPI ?He is here to discuss multiple issues.  Several months ago he had the incidence of right-sided chest dullness and was seen by Dr. Tomi Bamberger.  Apparently the examination was negative.  He has had this intermittently since then and is mostly concerned about stroke.  He has had no associated weakness, diaphoresis or shortness of breath.  He then mentioned episodes of dizziness but again very difficult to assess whether this was position related, head position related but no blurred vision, double vision, weakness.  He then mentioned having intermittent scabs in his ears and on the scalp that he could not see.  He then mentioned occasionally seeing blood on his pillow but cannot identify the source. ? ?Review of Systems ? ?   ?Objective:  ? Physical Exam ?Alert and in no distress.  EOMI.  Other cranial nerves grossly intact.  No dizziness was truly is elicited with head position.  DTRs normal.  Cardiac exam normal. ? ? ? ?   ?Assessment & Plan:  ?Dizziness ? ?Chest pain, unspecified type ?I explained that at this time I cannot fully explain the dizziness.  Encouraged him to keep track of when his symptoms are causing him the most discomfort.  Also discussed the fact that seeing blood and scabs and not further pursuing where they come from would be difficult.  Recommend him to pay more attention to all of these issues to see if he can figure it out on his own.  Reassured him that none of the symptoms that he is having are stroke related which was his main concern.  Greater than 30 minutes spent discussing all these issues with him. ?

## 2021-04-15 ENCOUNTER — Other Ambulatory Visit: Payer: Self-pay | Admitting: Psychiatry

## 2021-04-15 DIAGNOSIS — E871 Hypo-osmolality and hyponatremia: Secondary | ICD-10-CM

## 2021-04-18 ENCOUNTER — Other Ambulatory Visit: Payer: Self-pay | Admitting: Psychiatry

## 2021-04-18 DIAGNOSIS — E871 Hypo-osmolality and hyponatremia: Secondary | ICD-10-CM

## 2021-05-01 ENCOUNTER — Other Ambulatory Visit: Payer: Self-pay | Admitting: Psychiatry

## 2021-05-01 DIAGNOSIS — F422 Mixed obsessional thoughts and acts: Secondary | ICD-10-CM

## 2021-05-01 DIAGNOSIS — F4010 Social phobia, unspecified: Secondary | ICD-10-CM

## 2021-05-11 ENCOUNTER — Ambulatory Visit: Payer: Self-pay | Admitting: Psychiatry

## 2021-06-08 DIAGNOSIS — S92332A Displaced fracture of third metatarsal bone, left foot, initial encounter for closed fracture: Secondary | ICD-10-CM | POA: Insufficient documentation

## 2021-06-15 ENCOUNTER — Telehealth: Payer: Self-pay | Admitting: Family Medicine

## 2021-06-15 ENCOUNTER — Ambulatory Visit: Payer: 59 | Admitting: Medical

## 2021-06-15 DIAGNOSIS — R0602 Shortness of breath: Secondary | ICD-10-CM | POA: Insufficient documentation

## 2021-06-15 DIAGNOSIS — R000 Tachycardia, unspecified: Secondary | ICD-10-CM

## 2021-06-15 DIAGNOSIS — R0789 Other chest pain: Secondary | ICD-10-CM

## 2021-06-15 DIAGNOSIS — E039 Hypothyroidism, unspecified: Secondary | ICD-10-CM

## 2021-06-15 DIAGNOSIS — I10 Essential (primary) hypertension: Secondary | ICD-10-CM | POA: Diagnosis not present

## 2021-06-15 DIAGNOSIS — Z79899 Other long term (current) drug therapy: Secondary | ICD-10-CM | POA: Diagnosis not present

## 2021-06-15 DIAGNOSIS — R0683 Snoring: Secondary | ICD-10-CM

## 2021-06-15 DIAGNOSIS — R002 Palpitations: Secondary | ICD-10-CM

## 2021-06-15 DIAGNOSIS — G4733 Obstructive sleep apnea (adult) (pediatric): Secondary | ICD-10-CM | POA: Insufficient documentation

## 2021-06-15 LAB — BASIC METABOLIC PANEL
BUN/Creatinine Ratio: 13 (ref 9–20)
BUN: 14 mg/dL (ref 6–24)
CO2: 28 mmol/L (ref 20–29)
Calcium: 9.4 mg/dL (ref 8.7–10.2)
Chloride: 98 mmol/L (ref 96–106)
Creatinine, Ser: 1.06 mg/dL (ref 0.76–1.27)
Glucose: 134 mg/dL — ABNORMAL HIGH (ref 70–99)
Potassium: 4.7 mmol/L (ref 3.5–5.2)
Sodium: 139 mmol/L (ref 134–144)
eGFR: 81 mL/min/{1.73_m2} (ref >59)
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
## 2021-06-15 NOTE — Addendum Note (Signed)
Addended by: Minette Headland A on: 06/15/2021 01:43 PM   Modules accepted: Orders

## 2021-06-15 NOTE — Telephone Encounter (Signed)
Pt called and states that he believes he has experienced stroke symptoms. Pt was advised that if he is having those issues he needs to call 911 and go to the ER. PT advised that he was not currently having those symptoms and states that the last time he did was a week ago. Pt was made an appt for today. I then walked back to discuss pt with Kathyrn Sheriff CMA. I did not fell comfortable with pt's decision. Sending this message back to [facility]. Per Maudie Mercury she will call to discuss.

## 2021-06-16 ENCOUNTER — Encounter: Payer: Self-pay | Admitting: Cardiology

## 2021-06-16 ENCOUNTER — Ambulatory Visit: Payer: 59 | Admitting: Cardiology

## 2021-06-16 VITALS — BP 130/82 | HR 102 | Ht 75.0 in | Wt 269.8 lb

## 2021-06-16 DIAGNOSIS — R000 Tachycardia, unspecified: Secondary | ICD-10-CM | POA: Diagnosis not present

## 2021-06-16 NOTE — Progress Notes (Signed)
Cardiology Office Note   Date:  06/16/2021   ID:  Zachary [name], DOB [birth year], MRN [id number]  PCP:  Denita Lung, MD  Cardiologist:   None Referring:  Denita Lung, MD  Chief Complaint  Patient presents with   Palpitations      History of Present Illness: Zachary [name] is a 61 y.o. male who was referred by Denita Lung, MD for palpitations.  He was seen in PCP office  yesterday. .  I have reviewed these records for this visit.  He had palpitations and felt irregular and thought to be possibly atrial fibrillation.  EKG did not demonstrate this however.  He did have sinus tachycardia.  He had been seen in April by Dr. Collene Mares and describes some right-sided chest discomfort.  He has not had any prior cardiac work-up.  Years ago he was told he had some arrhythmia but there was no documentation of any specific diagnosis.  He really thinks he has been having all the symptoms of atrial fibrillation that he has heard about.  However, he really cannot quantify or qualify these at this point.  He does not really recall palpitations with resting heart rate.  He has not had any new presyncope or syncope that he recalls.  He denies any left-sided chest pressure does have some right-sided discomfort goes away when he eats.  He does not have an active physical job though he has not been doing this since he injured his left foot recently.  Usually he can lift and carry and not bring on any of his symptoms.  He is not describing any new shortness of breath, PND or orthopnea.  Has had no new weight gain or edema.   Past Medical History:  Diagnosis Date   Anxiety    Crohn disease ([place])    pt reports subsequent testing was normal   Depression    GERD (gastroesophageal reflux disease)    Herpes simplex    Hoarding disorder    Hyperlipidemia    Hypertension    IBS (irritable bowel syndrome)    Incontinence     Past Surgical History:  Procedure Laterality Date   APPENDECTOMY      COLONOSCOPY     POLYPECTOMY     TWISTED TESTICLES  1970   [place]    UMBILICAL HERNIA REPAIR       Current Outpatient Medications  Medication Sig Dispense Refill   aspirin 81 MG EC tablet Take 81 mg by mouth daily. Swallow whole.     budesonide (ENTOCORT EC) 3 MG 24 hr capsule TAKE 3 CAPSULES BY MOUTH EVERY DAY 90 capsule 5   busPIRone (BUSPAR) 30 MG tablet TAKE 1 TABLET(30 MG) BY MOUTH TWICE DAILY 180 tablet 0   CREON 36000-114000 units CPEP capsule TAKE 4 CAPSULES BY MOUTH BEFORE A MEAL& 2 WITH EACH SNACK 1440 capsule 3   donepezil (ARICEPT) 10 MG tablet TAKE 1 TABLET(10 MG) BY MOUTH AT BEDTIME 90 tablet 1   fluvoxaMINE (LUVOX) 100 MG tablet TAKE 1 TABLET BY MOUTH EVERY MORNING THEN TAKE 3 TABLETS BY MOUTH EVERY EVENING 360 tablet 1   lamoTRIgine (LAMICTAL) 200 MG tablet TAKE 1 TABLET(200 MG) BY MOUTH DAILY 90 tablet 1   levothyroxine (SYNTHROID) 75 MCG tablet TAKE 1 TABLET(75 MCG) BY MOUTH DAILY 90 tablet 1   lithium carbonate 150 MG capsule TAKE 1 CAPSULE(150 MG) BY MOUTH DAILY 90 capsule 0   losartan-hydrochlorothiazide (HYZAAR) 100-12.5 MG tablet Take 1 tablet  by mouth daily.     mesalamine (APRISO) 0.375 g 24 hr capsule Take 4 capsules (1.5 g total) by mouth daily. 120 capsule 11   OLANZapine (ZYPREXA) 15 MG tablet TAKE 1 TABLET(15 MG) BY MOUTH AT BEDTIME 90 tablet 0   sodium chloride 1 g tablet TAKE 1 TABLET(1 GRAM) BY MOUTH THREE TIMES DAILY 90 tablet 0   ALPRAZolam (XANAX) 0.5 MG tablet Take 1 tablet (0.5 mg total) by mouth daily as needed. (Patient not taking: Reported on 06/16/2021) 30 tablet 2   losartan (COZAAR) 50 MG tablet Take 1 tablet (50 mg total) by mouth daily. (Patient not taking: Reported on 06/16/2021) 90 tablet 3   Multiple Vitamin (MULTIVITAMIN WITH MINERALS) TABS Take 1 tablet by mouth daily. (Patient not taking: Reported on 06/16/2021)     No current facility-administered medications for this visit.    Allergies:   Erythromycin, Penicillins, and  Tetracyclines & related    Social History:  The patient  reports that he quit smoking about 43 years ago. His smoking use included cigarettes. He has never used smokeless tobacco. He reports that he does not drink alcohol and does not use drugs.   Family History:  The patient's family history includes Arthritis in his mother; Breast cancer in his maternal grandmother; Cancer in his maternal grandfather and paternal grandmother; Depression in his mother; Hypertension in his father; Lung cancer in his maternal aunt; Prostate cancer in his paternal uncle and paternal uncle; Prostate cancer (age of onset: 57) in his father; Stroke in his mother.    ROS:  Please see the history of present illness.   Otherwise, review of systems are positive for none.   All other systems are reviewed and negative.    PHYSICAL EXAM: VS:  BP 130/82 (BP Location: Left Arm, Patient Position: Sitting, Cuff Size: Large)   Pulse (!) 102   Ht 6' 3"  (1.905 m)   Wt 269 lb 12.8 oz (122.4 kg)   SpO2 97%   BMI 33.72 kg/m  , BMI Body mass index is 33.72 kg/m. GENERAL:  Well appearing HEENT:  Pupils equal round and reactive, fundi not visualized, oral mucosa unremarkable NECK:  No jugular venous distention, waveform within normal limits, carotid upstroke brisk and symmetric, no bruits, no thyromegaly LYMPHATICS:  No cervical, inguinal adenopathy LUNGS:  Clear to auscultation bilaterally BACK:  No CVA tenderness CHEST:  Unremarkable HEART:  PMI not displaced or sustained,S1 and S2 within normal limits, no S3, no S4, no clicks, no rubs, no murmurs ABD:  Flat, positive bowel sounds normal in frequency in pitch, no bruits, no rebound, no guarding, no midline pulsatile mass, no hepatomegaly, no splenomegaly EXT:  2 plus pulses throughout, no edema, no cyanosis no clubbing SKIN:  No rashes no nodules NEURO:  Cranial nerves II through XII grossly intact, motor grossly intact throughout PSYCH:  Cognitively intact, oriented to  person place and time    EKG:  EKG is ordered today. The ekg ordered today demonstrates sinus rhythm, rate 102, axis within normal limits, intervals within normal limits, no acute ST-T wave changes.   Recent Labs: 08/30/2020: ALT 16; Hemoglobin 13.9; Platelets 341 02/03/2021: TSH 0.756 06/15/2021: BUN 14; Creatinine, Ser 1.06; Potassium 4.7; Sodium 139    Lipid Panel    Component Value Date/Time   CHOL 186 07/16/2018 1028   TRIG 161 (H) 07/16/2018 1028   HDL 53 07/16/2018 1028   CHOLHDL 3.5 07/16/2018 1028   CHOLHDL 3.6 07/30/2013 0913   VLDL 56 (  H) 07/30/2013 0913   LDLCALC 101 (H) 07/16/2018 1028      Wt Readings from Last 3 Encounters:  06/16/21 269 lb 12.8 oz (122.4 kg)  06/15/21 267 lb 9.6 oz (121.4 kg)  04/13/21 254 lb 1.6 oz (115.3 kg)      Other studies Reviewed: Additional studies/ records that were reviewed today include: Primary care records. Review of the above records demonstrates:  Please see elsewhere in the note.     ASSESSMENT AND PLAN:  Tachycardia: The patient has vague symptoms of was worried that he had atrial fibrillation.  He had normal TSH earlier this year.  Electrolytes are unremarkable last year.  I am going to have him wear a 4-week event monitor.  Further evaluation will be based on these results.  Chest discomfort: I will have a low threshold to send her treadmill.  Hypertension: Blood pressure is controlled.  Continue the meds as listed.  Current medicines are reviewed at length with the patient today.  The patient does not have concerns regarding medicines.  The following changes have been made:  no change  Labs/ tests ordered today include: None  Orders Placed This Encounter  Procedures   Cardiac event monitor   EKG 12-Lead     Disposition:   FU with me in 2 months.     Signed, Minus Breeding, MD  06/16/2021 5:08 PM    [facility]

## 2021-06-16 NOTE — Patient Instructions (Signed)
  Testing/Procedures:  Your physician has recommended that you wear an event monitor. Event monitors are medical devices that record the heart's electrical activity. Doctors most often [place] these monitors to diagnose arrhythmias. Arrhythmias are problems with the speed or rhythm of the heartbeat. The monitor is a small, portable device. You can wear one while you do your normal daily activities. This is usually used to diagnose what is causing palpitations/syncope (passing out).    Follow-Up: At [facility], you and your health needs are our priority.  As part of our continuing mission to provide you with exceptional heart care, we have created designated Provider Care Teams.  These Care Teams include your primary Cardiologist (physician) and Advanced Practice Providers (APPs -  Physician Assistants and Nurse Practitioners) who all work together to provide you with the care you need, when you need it.  We recommend signing up for the patient portal called "MyChart".  Sign up information is provided on this After Visit Summary.  MyChart is used to connect with patients for Virtual Visits (Telemedicine).  Patients are able to view lab/test results, encounter notes, upcoming appointments, etc.  Non-urgent messages can be sent to your provider as well.   To learn more about what you can do with MyChart, go to [email or web address].    Your next appointment:   2 month(s)  The format for your next appointment:   In Person  Provider:   Minus Breeding MD      Important Information About Sugar

## 2021-06-19 ENCOUNTER — Telehealth: Payer: Self-pay | Admitting: Gastroenterology

## 2021-06-19 ENCOUNTER — Encounter: Payer: Self-pay | Admitting: *Deleted

## 2021-06-19 ENCOUNTER — Other Ambulatory Visit: Payer: Self-pay

## 2021-06-19 ENCOUNTER — Other Ambulatory Visit: Payer: 59

## 2021-06-19 MED ORDER — PREDNISONE 20 MG PO TABS: ORAL_TABLET | ORAL | 0 refills | Status: AC

## 2021-06-19 NOTE — Telephone Encounter (Signed)
Change budesonide to 9 mg po qam - not tid Prednisone 40 mg x 3d, then 30 mg x 3d, then 20 mg x 3d, then 10 mg x 3d then 10 mg qod x 6d for suspected Crohn's flare Schedule REV with me or an APP in 3-4 week to assess response

## 2021-06-19 NOTE — Telephone Encounter (Signed)
Spoke with patient regarding MD recommendations. He did confirm that he is taking Creon as prescribed prior to each meal & snack, and has been taking budesonide 3 mg TID since April.

## 2021-06-19 NOTE — Telephone Encounter (Signed)
He has had this complaint previously felt due to mild Crohn's activity causing mucous per rectum. Continue Apriso 1.5 g qd. Start budesonide 9 mg po qd for 2 months then stop. He should go to the commode for flatus to help reduce leakage. Please check to be sure he is using Creon at the start of each meal, snack.

## 2021-06-19 NOTE — Progress Notes (Signed)
Patient ID: Zachary [name], male   DOB: 1960-03-01, 61 y.o.   MRN: [id number] Patient enrolled for [employer] to ship a 30 day cardiac event monitor to his address on file.  Letter with instructions mailed to patient.    Dr. Percival Spanish to read.

## 2021-06-19 NOTE — Telephone Encounter (Signed)
Patient called in with complaints of loose stools & a fecal "discharge" when he passes gas. He has been experiencing this several times a day for the last 2-3 weeks. He is currently taking Apriso & Creon daily as prescribed, and has no complaints of any abdominal pain. Patient was last seen on 01/16/21 in [place] with Dr. Fuller Plan. He states he is concerned as to why he is experiencing incontinence every time he passes gas & would like Dr. Lynne Leader recommendations. Will route to MD.

## 2021-06-19 NOTE — Telephone Encounter (Signed)
Spoke with patient regarding MD recommendations. He has been given instructions for both medications & was able to verbalize all understanding. Prescription sent to pharmacy. Follow up scheduled for 07/13/21 at 10:00 am with [place], [place]. Reminder letter sent home as well per patient request.

## 2021-06-20 ENCOUNTER — Telehealth: Payer: Self-pay | Admitting: Medical

## 2021-06-20 ENCOUNTER — Encounter: Payer: Self-pay | Admitting: *Deleted

## 2021-06-20 NOTE — Telephone Encounter (Signed)
Patient advised and he will call back with readings.

## 2021-06-20 NOTE — Telephone Encounter (Signed)
Zachary [name] wanted to call and give an update on his blood pressure. States he had it taken twice and the first time it showed 148/102, second time it showed 156/109 and he is worried, he is wondering if he needs to come in for another follow up.

## 2021-06-24 ENCOUNTER — Other Ambulatory Visit: Payer: Self-pay | Admitting: Psychiatry

## 2021-06-24 DIAGNOSIS — F3341 Major depressive disorder, recurrent, in partial remission: Secondary | ICD-10-CM

## 2021-06-24 DIAGNOSIS — G3184 Mild cognitive impairment, so stated: Secondary | ICD-10-CM

## 2021-06-24 DIAGNOSIS — F423 Hoarding disorder: Secondary | ICD-10-CM

## 2021-06-24 DIAGNOSIS — F422 Mixed obsessional thoughts and acts: Secondary | ICD-10-CM

## 2021-06-24 DIAGNOSIS — F4010 Social phobia, unspecified: Secondary | ICD-10-CM

## 2021-06-26 ENCOUNTER — Ambulatory Visit: Payer: 59 | Admitting: Medical

## 2021-06-26 ENCOUNTER — Other Ambulatory Visit: Payer: 59

## 2021-06-26 ENCOUNTER — Other Ambulatory Visit: Payer: Self-pay | Admitting: Psychiatry

## 2021-06-26 ENCOUNTER — Encounter: Payer: Self-pay | Admitting: Medical

## 2021-06-26 ENCOUNTER — Telehealth: Payer: Self-pay | Admitting: Family Medicine

## 2021-06-26 VITALS — BP 130/100 | HR 80 | Temp 97.7°F | Resp 16 | Wt 268.4 lb

## 2021-06-26 DIAGNOSIS — R0602 Shortness of breath: Secondary | ICD-10-CM

## 2021-06-26 DIAGNOSIS — I10 Essential (primary) hypertension: Secondary | ICD-10-CM | POA: Diagnosis not present

## 2021-06-26 DIAGNOSIS — R0683 Snoring: Secondary | ICD-10-CM | POA: Diagnosis not present

## 2021-06-26 DIAGNOSIS — R000 Tachycardia, unspecified: Secondary | ICD-10-CM | POA: Diagnosis not present

## 2021-06-26 DIAGNOSIS — R002 Palpitations: Secondary | ICD-10-CM

## 2021-06-26 DIAGNOSIS — G3184 Mild cognitive impairment, so stated: Secondary | ICD-10-CM

## 2021-06-26 DIAGNOSIS — E039 Hypothyroidism, unspecified: Secondary | ICD-10-CM

## 2021-06-26 NOTE — Progress Notes (Signed)
Subjective:  Zachary [name] is a 61 y.o. male who presents for Chief Complaint  Patient presents with   Shortness of Breath    SOB, trouble breathing. BP 181/106 pulse 85, having trouble sleeping due to irregular breathing over the weekend. Has not received event monitor yet. No other symptoms that he is aware of. Zachary [name] put him on predinsone back on 6/19     Since his recent visit here he saw cardiology.   His blood pressure reading there was normal on 06/16/21.  He was advised to do event monitor.  However, he hasn't gotten the monitor yet.    He called our office last week, and we advised he monitor BPs and write them down.   He hasn't been checking or writing his BPs down.  Since seeing cardiology 10 days ago, he notes that one night he had a hard time breathing at night. Didn't happen last night  No prior sleep study  He has hx/o hypertension, hyperlipidemia, irregular heartbeat, anxiety,, Crohn's disease, depression, GERD  I saw him on June 15, 2021 for recent intermittent different symptoms that pop up at different times over the last several months.  Not necessarily multiple symptoms at the same time. In last few weeks tingling in right hand that occurred once.  Sees workers comp for accident and they were not concerned about tingling that happened only once.  Prior to last visit he had an episode of right-sided chest pain while he was in bed but it resolved after eating.  He has not had any other chest pain.  He wonders about irregular heartbeat. no numbness or tingling or weakness otherwise, no off balance, no fall, no confusion, no slurred speech.  Mother died of multiple strokes.   No other aggravating or relieving factors.    No other c/o.  Past Medical History:  Diagnosis Date   Anxiety    Crohn disease ([facility])    pt reports subsequent testing was normal   Depression    GERD (gastroesophageal reflux disease)    Herpes simplex    Hoarding disorder     Hyperlipidemia    Hypertension    IBS (irritable bowel syndrome)    Incontinence     Current Outpatient Medications on File Prior to Visit  Medication Sig Dispense Refill   ALPRAZolam (XANAX) 0.5 MG tablet Take 1 tablet (0.5 mg total) by mouth daily as needed. 30 tablet 2   aspirin 81 MG EC tablet Take 81 mg by mouth daily. Swallow whole.     budesonide (ENTOCORT EC) 3 MG 24 hr capsule TAKE 3 CAPSULES BY MOUTH EVERY DAY 90 capsule 5   busPIRone (BUSPAR) 30 MG tablet TAKE 1 TABLET(30 MG) BY MOUTH TWICE DAILY 180 tablet 0   CREON 36000-114000 units CPEP capsule TAKE 4 CAPSULES BY MOUTH BEFORE A MEAL& 2 WITH EACH SNACK 1440 capsule 3   donepezil (ARICEPT) 10 MG tablet TAKE 1 TABLET(10 MG) BY MOUTH AT BEDTIME 30 tablet 0   fluvoxaMINE (LUVOX) 100 MG tablet TAKE 1 TABLET BY MOUTH EVERY MORNING THEN TAKE 3 TABLETS BY MOUTH EVERY EVENING 120 tablet 0   lamoTRIgine (LAMICTAL) 200 MG tablet TAKE 1 TABLET(200 MG) BY MOUTH DAILY 30 tablet 0   levothyroxine (SYNTHROID) 75 MCG tablet TAKE 1 TABLET(75 MCG) BY MOUTH DAILY 90 tablet 1   lithium carbonate 150 MG capsule TAKE 1 CAPSULE(150 MG) BY MOUTH DAILY 30 capsule 0   losartan-hydrochlorothiazide (HYZAAR) 100-12.5 MG tablet Take 1 tablet by mouth  daily.     Multiple Vitamin (MULTIVITAMIN WITH MINERALS) TABS Take 1 tablet by mouth daily.     OLANZapine (ZYPREXA) 15 MG tablet TAKE 1 TABLET(15 MG) BY MOUTH AT BEDTIME 90 tablet 0   predniSONE (DELTASONE) 20 MG tablet Take 2 tablets (40 mg total) by mouth daily with breakfast for 3 days, THEN 1.5 tablets (30 mg total) daily with breakfast for 3 days, THEN 1 tablet (20 mg total) daily with breakfast for 3 days, THEN 0.5 tablets (10 mg total) daily with breakfast for 3 days, THEN 0.5 tablets (10 mg total) every other day for 6 days. 16.5 tablet 0   sodium chloride 1 g tablet TAKE 1 TABLET(1 GRAM) BY MOUTH THREE TIMES DAILY 90 tablet 0   mesalamine (APRISO) 0.375 g 24 hr capsule Take 4 capsules (1.5 g total) by  mouth daily. 120 capsule 11   No current facility-administered medications on file prior to visit.     The following portions of the patient's history were reviewed and updated as appropriate: allergies, current medications, past family history, past medical history, past social history, past surgical history and problem list.  ROS Otherwise as in subjective above    Objective: BP (!) 130/100   Pulse 80   Temp 97.7 F (36.5 C)   Resp 16   Wt 268 lb 6.4 oz (121.7 kg)   SpO2 99%   BMI 33.55 kg/m   General appearance: alert, no distress, well developed, well nourished, white male Neck: supple, no lymphadenopathy, no thyromegaly, no masses, no JVD or bruits Heart: RRR, normal S1, S2, no murmurs Lungs: CTA bilaterally, no wheezes, rhonchi, or rales Pulses: 2+ radial pulses, 2+ pedal pulses, normal cap refill Ext: no edema   Assessment: Encounter Diagnoses  Name Primary?   Tachycardia Yes   SOB (shortness of breath)    Snoring    Essential hypertension    Acquired hypothyroidism    Palpitation       Plan: Palpitations and tachycardia-I reviewed his cardiology consult note from 10 days ago.  He has been referred for Holter monitor testing.  He is still awaiting the device.  Pulse rate today is normal  Hypertension-at his recent cardiology visit his blood pressure was okay.  Today is elevated.  He is not checking his blood pressures like we asked him to last visit.  He will start checking his blood pressures as we requested.  He will come back today and bring his personal blood pressure cuff to see our CMA to compare his cuff versus our cuff readings  Shortness of breath-he had 1 episode of shortness of breath within the past week when he was sleeping or lying down.  He has not had persistent orthopnea, just that 1 night.  I advised if he continues to have any other symptoms to contact cardiology or if symptoms of heart attack or stroke to call 911.  Snoring, hypertension,  tachycardia-continue plan for sleep study.  We have placed referral for home sleep study last visit 10 days ago  Hypothyroidism-continue current medication  Zachary [name] was seen today for shortness of breath.  Diagnoses and all orders for this visit:  Tachycardia  SOB (shortness of breath)  Snoring  Essential hypertension  Acquired hypothyroidism  Palpitation   Follow up: Pending sleep study, event monitor per cardiology

## 2021-06-26 NOTE — Telephone Encounter (Signed)
Pt is taking medication and not out. He just wanted [place] to know that he is taking this

## 2021-06-29 ENCOUNTER — Telehealth: Payer: Self-pay

## 2021-06-29 NOTE — Telephone Encounter (Signed)
Pt. Called back to ask about him checking his BP. He wanted to know if he needed to let you know the top and bottom numbers. He also wanted to know if he is supposed to deduct 15 points from the top and bottom number because if BP cuff is small.

## 2021-06-30 ENCOUNTER — Other Ambulatory Visit: Payer: Self-pay | Admitting: Psychiatry

## 2021-06-30 DIAGNOSIS — F3341 Major depressive disorder, recurrent, in partial remission: Secondary | ICD-10-CM

## 2021-06-30 DIAGNOSIS — F422 Mixed obsessional thoughts and acts: Secondary | ICD-10-CM

## 2021-06-30 DIAGNOSIS — F4010 Social phobia, unspecified: Secondary | ICD-10-CM

## 2021-07-03 ENCOUNTER — Encounter: Payer: Self-pay | Admitting: Physician Assistant

## 2021-07-03 ENCOUNTER — Telehealth: Payer: Self-pay | Admitting: Gastroenterology

## 2021-07-03 ENCOUNTER — Ambulatory Visit: Payer: 59 | Admitting: Physician Assistant

## 2021-07-03 VITALS — BP 130/90 | HR 96 | Ht 75.0 in | Wt 268.2 lb

## 2021-07-03 DIAGNOSIS — F4010 Social phobia, unspecified: Secondary | ICD-10-CM | POA: Diagnosis not present

## 2021-07-03 DIAGNOSIS — K50818 Crohn's disease of both small and large intestine with other complication: Secondary | ICD-10-CM | POA: Diagnosis not present

## 2021-07-03 DIAGNOSIS — F429 Obsessive-compulsive disorder, unspecified: Secondary | ICD-10-CM | POA: Diagnosis not present

## 2021-07-03 DIAGNOSIS — F330 Major depressive disorder, recurrent, mild: Secondary | ICD-10-CM | POA: Diagnosis not present

## 2021-07-03 MED ORDER — ALPRAZOLAM 0.5 MG PO TABS: 0.5000 mg | ORAL_TABLET | Freq: Every evening | ORAL | 0 refills | Status: DC | PRN

## 2021-07-03 NOTE — Assessment & Plan Note (Signed)
Stable, continue follow up with GI, patient informed that side effects/adverse reactions of prednisone are aggravating his symptoms

## 2021-07-03 NOTE — Assessment & Plan Note (Signed)
Stable, follow up with Psychiatrist, encouraged follow up with Cardiology and Sleep Disorder offices as well

## 2021-07-03 NOTE — Telephone Encounter (Signed)
Patient called in with complaints of anxiety, depression, and suicidal thoughts that started this past Saturday, 7/1. He went to his PCP today who suggested it may be coming from the prednisone that he was prescribed on 6/19 for suspected crohns flare up. He states he only has 2 doses left. Patient has been advised to hold medication, until further MD recommendations.

## 2021-07-03 NOTE — Assessment & Plan Note (Signed)
Stable, continue current management, follow up with Psychiatrist

## 2021-07-03 NOTE — Assessment & Plan Note (Signed)
Xanax refilled, follow up with Psychiatrist

## 2021-07-03 NOTE — Patient Instructions (Addendum)
If you need immediate help, go to: [facility]  Phone:  [phone]  Address:  [place], [place] [address]  Hours:  Open 24/7, No appointment required. ________________________________________________________________  Take xanax 0.5 mg, 1 by mouth today, while NOT at work, no driving while taking xanax;do not take Xanax tonight (Monday 7/3) because you are doing your home sleep study; after that may take xanax 0.5 mg during the day after all driving is complete or take xanax 0.5 mg as needed at night    note written to be off work today

## 2021-07-03 NOTE — Progress Notes (Signed)
Established Patient Office Visit  Subjective:  Patient ID: Zachary [name], male    DOB: [birth year]  Age: 61 y.o. MRN: [id number]  CC:  Chief Complaint  Patient presents with   Acute Visit    Possible anxiety attack, pt stated he has not slept    HPI Zachary [name] presents for feeling a lot of anxiety; feels he doesn't have control of his body; reports a history of depression that got worse after he broke his foot last month and had to stay in his apartment, but now reports he is able to walk and his foot is better; is on medicine for depression and anxiety (xanax, buspar, luvox, and lamictal) prescribed by a Psychiatrist, has an upcoming appointment with his Psychiatrist, Dr. Clovis Pu, on 07/14/2021 and states he has enough of his psych medicines; states his medicines had been working but his heart palpitation test, incontinence is worse, and home apnea test is causing more anxiety; states he messed up his home apnea test; had trouble sleeping last night; is currently finishing a course of prednisone for crohn's disease and states he wasn't aware of the side effects for prednisone of sleeplessness, GI upset, and irritability; denies suicidal or homicidal ideation; requests a refill of xanax 0.5 mg and presents with a bottle of xanax 0.5 mg with several pills in it that was filled in 03/2020  Outpatient Medications Prior to Visit  Medication Sig Dispense Refill   aspirin 81 MG EC tablet Take 81 mg by mouth daily. Swallow whole.     budesonide (ENTOCORT EC) 3 MG 24 hr capsule TAKE 3 CAPSULES BY MOUTH EVERY DAY 90 capsule 5   busPIRone (BUSPAR) 30 MG tablet TAKE 1 TABLET(30 MG) BY MOUTH TWICE DAILY 180 tablet 0   CREON 36000-114000 units CPEP capsule TAKE 4 CAPSULES BY MOUTH BEFORE A MEAL& 2 WITH EACH SNACK 1440 capsule 3   donepezil (ARICEPT) 10 MG tablet TAKE 1 TABLET(10 MG) BY MOUTH AT BEDTIME 30 tablet 0   fluvoxaMINE (LUVOX) 100 MG tablet TAKE 1 TABLET BY MOUTH EVERY MORNING THEN TAKE  3 TABLETS BY MOUTH EVERY EVENING 120 tablet 0   lamoTRIgine (LAMICTAL) 200 MG tablet TAKE 1 TABLET(200 MG) BY MOUTH DAILY 30 tablet 0   levothyroxine (SYNTHROID) 75 MCG tablet TAKE 1 TABLET(75 MCG) BY MOUTH DAILY 90 tablet 1   lithium carbonate 150 MG capsule TAKE 1 CAPSULE(150 MG) BY MOUTH DAILY 30 capsule 0   losartan-hydrochlorothiazide (HYZAAR) 100-12.5 MG tablet Take 1 tablet by mouth daily.     Multiple Vitamin (MULTIVITAMIN WITH MINERALS) TABS Take 1 tablet by mouth daily.     OLANZapine (ZYPREXA) 15 MG tablet TAKE 1 TABLET(15 MG) BY MOUTH AT BEDTIME 30 tablet 0   predniSONE (DELTASONE) 20 MG tablet Take 2 tablets (40 mg total) by mouth daily with breakfast for 3 days, THEN 1.5 tablets (30 mg total) daily with breakfast for 3 days, THEN 1 tablet (20 mg total) daily with breakfast for 3 days, THEN 0.5 tablets (10 mg total) daily with breakfast for 3 days, THEN 0.5 tablets (10 mg total) every other day for 6 days. 16.5 tablet 0   sodium chloride 1 g tablet TAKE 1 TABLET(1 GRAM) BY MOUTH THREE TIMES DAILY 90 tablet 0   mesalamine (APRISO) 0.375 g 24 hr capsule Take 4 capsules (1.5 g total) by mouth daily. 120 capsule 11   ALPRAZolam (XANAX) 0.5 MG tablet Take 1 tablet (0.5 mg total) by mouth daily as needed. (Patient not  taking: Reported on 07/03/2021) 30 tablet 2   No facility-administered medications prior to visit.    Allergies  Allergen Reactions   Erythromycin     Pt unsure, reports he recently received a -mycin drug with no reaction.    Penicillins     REACTION: "it may kill me"   Tetracyclines & Related Hives    Patient Care Team: Denita Lung, MD as PCP - General (Family Medicine)  ROS Review of Systems  Constitutional:  Negative for activity change, chills, fatigue and fever.  HENT:  Negative for congestion, ear pain, hearing loss and voice change.   Eyes:  Negative for pain and redness.  Respiratory:  Negative for cough and shortness of breath.   Cardiovascular:   Negative for leg swelling.  Gastrointestinal:  Negative for constipation, diarrhea, nausea and vomiting.  Endocrine: Negative for polyuria.  Genitourinary:  Negative for flank pain and frequency.  Musculoskeletal:  Negative for joint swelling and neck pain.  Skin:  Negative for rash.  Neurological:  Negative for dizziness.  Hematological:  Does not bruise/bleed easily.  Psychiatric/Behavioral:  Positive for sleep disturbance. Negative for agitation, behavioral problems, self-injury and suicidal ideas. The patient is nervous/anxious.       Objective:    Physical Exam Vitals reviewed.  Constitutional:      General: He is not in acute distress.    Appearance: Normal appearance.  HENT:     Head: Normocephalic and atraumatic.     Right Ear: External ear normal.     Left Ear: External ear normal.     Nose: No congestion.  Eyes:     Extraocular Movements: Extraocular movements intact.     Conjunctiva/sclera: Conjunctivae normal.     Pupils: Pupils are equal, round, and reactive to light.  Cardiovascular:     Rate and Rhythm: Normal rate and regular rhythm.     Pulses: Normal pulses.     Heart sounds: Normal heart sounds.  Pulmonary:     Effort: Pulmonary effort is normal.     Breath sounds: Normal breath sounds. No wheezing.  Abdominal:     General: Bowel sounds are normal.     Palpations: Abdomen is soft.  Musculoskeletal:        General: Normal range of motion.     Cervical back: Normal range of motion.     Right lower leg: No edema.     Left lower leg: No edema.  Skin:    General: Skin is warm and dry.  Neurological:     Mental Status: He is alert and oriented to person, place, and time.  Psychiatric:        Mood and Affect: Mood normal.        Behavior: Behavior normal.     BP 130/90   Pulse 96   Wt 268 lb 3.2 oz (121.7 kg)   SpO2 95%   BMI 33.52 kg/m   Wt Readings from Last 3 Encounters:  07/03/21 268 lb 3.2 oz (121.7 kg)  06/26/21 268 lb 6.4 oz (121.7 kg)   06/16/21 269 lb 12.8 oz (122.4 kg)    Results for orders placed or performed in visit on 55/37/48  Basic metabolic panel  Result Value Ref Range   Glucose 134 (H) 70 - 99 mg/dL   BUN 14 6 - 24 mg/dL   Creatinine, Ser 1.06 0.76 - 1.27 mg/dL   eGFR 81 >59 mL/min/1.73   BUN/Creatinine Ratio 13 9 - 20   Sodium 139 134 -  144 mmol/L   Potassium 4.7 3.5 - 5.2 mmol/L   Chloride 98 96 - 106 mmol/L   CO2 28 20 - 29 mmol/L   Calcium 9.4 8.7 - 10.2 mg/dL     Last CBC Lab Results  Component Value Date   WBC 8.7 08/30/2020   HGB 13.9 08/30/2020   HCT 40.6 08/30/2020   MCV 91 08/30/2020   MCH 31.2 08/30/2020   RDW 12.1 08/30/2020   PLT 341 69/67/8938   Last metabolic panel Lab Results  Component Value Date   GLUCOSE 134 (H) 06/15/2021   NA 139 06/15/2021   K 4.7 06/15/2021   CL 98 06/15/2021   CO2 28 06/15/2021   BUN 14 06/15/2021   CREATININE 1.06 06/15/2021   EGFR 81 06/15/2021   CALCIUM 9.4 06/15/2021   PROT 6.5 08/30/2020   ALBUMIN 4.4 08/30/2020   LABGLOB 2.1 08/30/2020   AGRATIO 2.1 08/30/2020   BILITOT 0.5 08/30/2020   ALKPHOS 88 08/30/2020   AST 17 08/30/2020   ALT 16 08/30/2020   ANIONGAP 10 03/04/2020   Last lipids Lab Results  Component Value Date   CHOL 186 07/16/2018   HDL 53 07/16/2018   LDLCALC 101 (H) 07/16/2018   TRIG 161 (H) 07/16/2018   CHOLHDL 3.5 07/16/2018   Last hemoglobin A1c No results found for: "HGBA1C" Last thyroid functions Lab Results  Component Value Date   TSH 0.756 02/03/2021   Last vitamin D No results found for: "25OHVITD2", "25OHVITD3", "VD25OH" Last vitamin B12 and Folate Lab Results  Component Value Date   BOFBPZWC58 527 08/30/2020   FOLATE 19.6 09/23/2007      The 10-year ASCVD risk score (Arnett DK, et al., 2019) is: 8.5%    Assessment & Plan:   Problem List Items Addressed This Visit       Digestive   CROHN'S DISEASE, LARGE AND SMALL INTESTINES    Stable, continue follow up with GI, patient informed  that side effects/adverse reactions of prednisone are aggravating his symptoms        Other   Major depression    Stable, continue current management, follow up with Psychiatrist      Relevant Medications   ALPRAZolam (XANAX) 0.5 MG tablet   OCD (obsessive compulsive disorder)    Stable, follow up with Psychiatrist, encouraged follow up with Cardiology and Sleep Disorder offices as well      Relevant Medications   ALPRAZolam (XANAX) 0.5 MG tablet   Social anxiety disorder - Primary    Xanax refilled, follow up with Psychiatrist      Relevant Medications   ALPRAZolam (XANAX) 0.5 MG tablet    Meds ordered this encounter  Medications   ALPRAZolam (XANAX) 0.5 MG tablet    Sig: Take 1 tablet (0.5 mg total) by mouth at bedtime as needed for anxiety.    Dispense:  15 tablet    Refill:  0    Order Specific Question:   Supervising Provider    Answer:   Denita Lung [[id number]]    Follow-up: Return for Return as Already Scheduled.    Irene Pap, PA-C

## 2021-07-05 NOTE — Telephone Encounter (Signed)
Unable to reach patient, vm box full.

## 2021-07-06 NOTE — Telephone Encounter (Signed)
Unable to reach patient, vm box full.

## 2021-07-07 ENCOUNTER — Other Ambulatory Visit: Payer: Self-pay | Admitting: Psychiatry

## 2021-07-07 ENCOUNTER — Ambulatory Visit: Payer: 59 | Admitting: Family Medicine

## 2021-07-07 VITALS — BP 132/78 | HR 94 | Temp 97.7°F | Wt 265.0 lb

## 2021-07-07 DIAGNOSIS — K50818 Crohn's disease of both small and large intestine with other complication: Secondary | ICD-10-CM | POA: Diagnosis not present

## 2021-07-07 DIAGNOSIS — F330 Major depressive disorder, recurrent, mild: Secondary | ICD-10-CM | POA: Diagnosis not present

## 2021-07-07 DIAGNOSIS — F4010 Social phobia, unspecified: Secondary | ICD-10-CM

## 2021-07-07 DIAGNOSIS — F429 Obsessive-compulsive disorder, unspecified: Secondary | ICD-10-CM | POA: Diagnosis not present

## 2021-07-07 DIAGNOSIS — F3341 Major depressive disorder, recurrent, in partial remission: Secondary | ICD-10-CM

## 2021-07-07 NOTE — Telephone Encounter (Signed)
Unable to reach patient x 3. VM box full. Letter sent home.

## 2021-07-07 NOTE — Progress Notes (Signed)
   Subjective:    Patient ID: Zachary [name], male    DOB: Sep 28, 1960, 61 y.o.   MRN: [id number]  HPI He is here for discussion of multiple issues.  He is quite anxious over the fact that he is about to get a sleep study as well as cardiology evaluation.  He was given Xanax but has concerns about long-term use of this.  He does complain of issues with decreased concentration.  He is followed by Dr. Clovis Pu and is on multiple medications.  He continues have difficulty with incontinence and is followed by Dr. Fuller Plan for this.   Review of Systems     Objective:   Physical Exam Alert and in no distress otherwise not examined       Assessment & Plan:  Mild episode of recurrent major depressive disorder (HCC)  Obsessive-compulsive disorder, unspecified type  Crohn's disease of both small and large intestine with other complication ([place])  Social anxiety disorder Discussed the use of Xanax and recommended that he follow-up with his psychiatrist as he is on multiple psychotropic medications including BuSpar.  Recommend any medication for anxiety be done through his psychiatrist.  Discussed the anxiety that he is having and did give some pointers on how to diminish this by giving himself positive thoughts and things to do to take his mind off of the impending sleep study and cardiology evaluation.  He will also follow-up with Dr. Fuller Plan concerning the incontinence.  In the past he had had difficulty with flatulence but apparently incontinence is new in spite of multiple GI medications.

## 2021-07-09 DIAGNOSIS — R000 Tachycardia, unspecified: Secondary | ICD-10-CM

## 2021-07-10 ENCOUNTER — Telehealth: Payer: Self-pay | Admitting: Psychiatry

## 2021-07-10 NOTE — Telephone Encounter (Signed)
Error.  Pt's isn't sure of the name of the medication that he says Dr Clovis Pu denied.

## 2021-07-13 ENCOUNTER — Ambulatory Visit: Payer: 59 | Admitting: Gastroenterology

## 2021-07-13 ENCOUNTER — Encounter: Payer: Self-pay | Admitting: Psychiatry

## 2021-07-13 ENCOUNTER — Other Ambulatory Visit: Payer: Self-pay | Admitting: Psychiatry

## 2021-07-13 ENCOUNTER — Ambulatory Visit (INDEPENDENT_AMBULATORY_CARE_PROVIDER_SITE_OTHER): Payer: 59 | Admitting: Psychiatry

## 2021-07-13 DIAGNOSIS — F3341 Major depressive disorder, recurrent, in partial remission: Secondary | ICD-10-CM

## 2021-07-13 DIAGNOSIS — F4010 Social phobia, unspecified: Secondary | ICD-10-CM

## 2021-07-13 DIAGNOSIS — F423 Hoarding disorder: Secondary | ICD-10-CM | POA: Diagnosis not present

## 2021-07-13 DIAGNOSIS — G3184 Mild cognitive impairment, so stated: Secondary | ICD-10-CM

## 2021-07-13 DIAGNOSIS — E871 Hypo-osmolality and hyponatremia: Secondary | ICD-10-CM

## 2021-07-13 DIAGNOSIS — F422 Mixed obsessional thoughts and acts: Secondary | ICD-10-CM

## 2021-07-13 MED ORDER — LAMOTRIGINE 200 MG PO TABS: 200.0000 mg | ORAL_TABLET | Freq: Every day | ORAL | 0 refills | Status: DC

## 2021-07-13 MED ORDER — BUSPIRONE HCL 30 MG PO TABS: 30.0000 mg | ORAL_TABLET | Freq: Two times a day (BID) | ORAL | 0 refills | Status: DC

## 2021-07-13 MED ORDER — DONEPEZIL HCL 10 MG PO TABS: 10.0000 mg | ORAL_TABLET | Freq: Every day | ORAL | 3 refills | Status: DC

## 2021-07-13 MED ORDER — FLUVOXAMINE MALEATE 100 MG PO TABS
ORAL_TABLET | ORAL | 5 refills | Status: DC
Start: 2021-07-13 — End: 2021-08-31

## 2021-07-13 MED ORDER — OLANZAPINE 15 MG PO TABS: 15.0000 mg | ORAL_TABLET | Freq: Every day | ORAL | 0 refills | Status: DC

## 2021-07-13 MED ORDER — LORAZEPAM 0.5 MG PO TABS: ORAL_TABLET | ORAL | 0 refills | Status: DC

## 2021-07-13 NOTE — Telephone Encounter (Signed)
Has appt with Dr. Clovis Pu today.

## 2021-07-13 NOTE — Progress Notes (Signed)
Zachary [name] [id number] [birth year] 61 y.o.  Subjective:   Patient ID:  Zachary [name] is a 61 y.o. (DOB [birth year]) male.  Chief Complaint:  Chief Complaint  Patient presents with   Follow-up   Depression   Anxiety    Depression        Associated symptoms include decreased concentration.  Associated symptoms include no suicidal ideas.  Past medical history includes anxiety.   Anxiety Symptoms include decreased concentration and nervous/anxious behavior. Patient reports no confusion, dizziness, nausea or suicidal ideas.     Zachary [name] presents to the office today for follow-up of depression, obsessive anxiety and paranoia about body odor and getting dementia.  Did feel somewhat reassured about the fact that Dr. Marcos Eke said he does not have Alzheimer's dx.  visit October 16, 2018.  Initiated a trial of naltrexone off label 50 mg daily for impulse control purchasing.  01/16/2019 appointment with the following noted No benefit naltrexone.  Doesn't like his job but fears change in jobs bc "of the farting" and doesn't feel he'd be accepted in other places.  In this job for 16 years without a promotion.  There have bbeen layoffs and he hasn't gotten layoffs.   The same overall with mental health issues.  Nice new boss.   Worries over his thoughts of patient.  Mood stable.   Continued concerns over spending on things he doesn't need or even really want.  Books he'll never read or DVDs he'll never watch. Huge problem buying too much off the Internet.  Blew check from incentive on impulse purchase from the Internet.  Books and collectibles per usual.  Running up debt.  No other impulsivity.   Not hyper nor otherwise manic.    No history of bankruptcy.  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  Maybe out of boredom.  Needs to stop and can't control himself.Has not talked to anyone about it.  Doesn't get anxious around the spending.  Not depressed.  Busy at work.  Sleep fine.   Sometimes anxious about passing gas at work and not otherwise anxious there.  Rarely used Xanax.   Depression has remained under control. Overall he thinks the depression and anxiety levels have been "fine".  Friends notice his comprehension problems. Not sure he trusts the neuropsychological testing which was normal and did not show dementia as he fears.  Plan: No meds changed.  07/23/2019 appointment with the following noted: Concerns over concentration and memory.  Thinks he's having trouble with word-finding.  Bosses satisfied with work response and function. Plan: Check B12 and folated DT cognitive complaints. Trial reduction in olanzapine to see if cognition is better to 15 mg daily.  11/25/2019 appointment with the following noted: Thinks he had a lot of olanzapine 20 and kept taking it and never reduced it yet. No mental health changes since here.  Work very busy.  Regular anxiety thing but no depression.  Obsesses over farting and bothering people. Plan: Trial reduction in olanzapine to see if cognition is better to 15 mg daily.  03/22/2020 appointment with following noted: Might be a little better with cognition with the reduction in olanzapine.  No worse anxiety, fear, depression.  Worry is not worse.  Still sleeps well.   Overall things are fine in his mental health and function.  Still working.  Tired of the job and considering job at [employer].  Got confused by the online app and dropped it.  Work pretty busy.  Employee situation  stable.   Plan: Trial reduction in olanzapine to see if cognition is better to 15 mg daily may be succcessful and nothing is worse so no change in dose..  08/26/20 TC:  Pt did not give me any info other thren he can't concentrate enough to count to 30.I asked him did he make any med changes lately and he said he stopped taking meds for incontinence but that's all.I will have admins add him to the cancellation list,but he wants to know if anything can be done about  the concentration.  09/22/20  appt noted: Hard time at work.  More trouble with concentration and memory.  Seemed worse and went to PCP and lab work up.  Normal B12, TSH, CBC, RPR. Head MRI mild atrophy. 08/30/20  Na 128 Was told to discuss psych meds and low sodium.   Sunday had a few hours of giddiness and then again yesterday and doesn't remember feeling that good as an adult.  Had not changed meds or used drugs.   Plan: Reduce fluvoxamine to 3 at night Stop lithium Add salt tablet 1 twice daily with food Repeat sodium level in 1 week. Sent message to patient's primary care physician regarding the possibility of stopping hydrochlorothiazide which could be contributing to the low sodium.  10/19/2020 appointment with the following noted: Just got sodium yesterday so hasn't repeated level.  HCTZ stopped. Didn't stop lithium. Says he still has concentration problems.  Had a hard time counting to 50.   Still working and goes slow but getting things done eventually.   Plan: Disc recentPatient had a recent episode of hyponatremia with some mental status changes.  The mental status changes have resolved.  We discussed the possibility that virtually every psychiatric medicine can cause low sodium as can hydrochlorothiazide.  Typically SSRIs are the most common cause.  We discussed the risk of reducing his psychiatric medication.  However the hyponatremia must be addressed. HCTZ was stopped, luvox reduced to 300 and salt added yesterday Repeat sodium in 1 week.  11/16/2020 phone call the patient from MD: He has a recent history of low sodium.  Tell him his sodium level is totally normal now at 137.  He can probably drop to 1 salt tablet daily at this point.  No further med changes are necessary.  12/29/2020 appointment with the following noted: Worrying about hoarding which has gotten really bad in the last few weeks.  Since father died have access to more money than I need.  Buy more than 1 of the  same item: Alfonso Patten dolls, [place] and glassware.   Got inheritance trust fund.   On med for Crohn's dz and believes he still smells and worries over it. Plan: Hoarding worsened since Luvox reduced to 300 mg daily so increase to 400 mg daily.  07/13/2021 appointment with the following noted: He continues buspirone 30 mg twice daily, Aricept 10 mg daily, fluvoxamine 400 mg daily, lamotrigine 200 mg daily, lithium 150 nightly, olanzapine 15 mg nightly. Not good.  3 mos ago fell off truck and landed on back and broke foot.  Laid up for few weeks and got depressed.  Since then has developed fecal incontinence.  Not sure how to deal with it.  Seeing GI doc. Had anxiety attack and had to leave work and go home.  Was triggered by obsessing on health issues. Asks for prn for anxiety.   apptetite is less but not sure about wt changes Had home sleep study for OSA  but no results yet. Generally worried about health. Patient denies difficulty with sleep initiation or maintenance over 8 hours.   Patient denies any suicidal ideation.  F has unknown cancer in chemo in early 7's.   Talk every other day.   Past psych med: Abilify, Zyprexa, Seroquel 600, olanzapine 30 Wellbutrin, nortriptyline, clomipramine, mirtazapine,  fluvoxamine 400, Lamotrigine, lithium no response,  stimulants, Aricept  buspirone 60,   Xanax Naltrexone for compulsive buying NR  Review of Systems:  Review of Systems  Constitutional:  Positive for unexpected weight change.  Respiratory:  Negative for cough.   Gastrointestinal:  Positive for diarrhea. Negative for abdominal distention, nausea and vomiting.  Musculoskeletal:  Positive for back pain.  Neurological:  Negative for dizziness, tremors and weakness.  Psychiatric/Behavioral:  Positive for decreased concentration. Negative for agitation, behavioral problems, confusion, dysphoric mood, hallucinations, self-injury, sleep disturbance and suicidal ideas. The patient is  nervous/anxious. The patient is not hyperactive.     Medications: I have reviewed the patient's current medications.  Current Outpatient Medications  Medication Sig Dispense Refill   aspirin 81 MG EC tablet Take 81 mg by mouth daily. Swallow whole.     budesonide (ENTOCORT EC) 3 MG 24 hr capsule TAKE 3 CAPSULES BY MOUTH EVERY DAY 90 capsule 5   CREON 36000-114000 units CPEP capsule TAKE 4 CAPSULES BY MOUTH BEFORE A MEAL& 2 WITH EACH SNACK 1440 capsule 3   levothyroxine (SYNTHROID) 75 MCG tablet TAKE 1 TABLET(75 MCG) BY MOUTH DAILY 90 tablet 1   lithium carbonate 150 MG capsule TAKE 1 CAPSULE(150 MG) BY MOUTH DAILY 30 capsule 0   LORazepam (ATIVAN) 0.5 MG tablet 1-2 daily as needed for anxiety 30 tablet 0   losartan-hydrochlorothiazide (HYZAAR) 100-12.5 MG tablet Take 1 tablet by mouth daily.     Multiple Vitamin (MULTIVITAMIN WITH MINERALS) TABS Take 1 tablet by mouth daily.     sodium chloride 1 g tablet TAKE 1 TABLET(1 GRAM) BY MOUTH THREE TIMES DAILY 90 tablet 0   busPIRone (BUSPAR) 30 MG tablet Take 1 tablet (30 mg total) by mouth 2 (two) times daily. 180 tablet 0   donepezil (ARICEPT) 10 MG tablet Take 1 tablet (10 mg total) by mouth at bedtime. 90 tablet 3   fluvoxaMINE (LUVOX) 100 MG tablet TAKE 1 TABLET BY MOUTH EVERY MORNING THEN TAKE 3 TABLETS BY MOUTH EVERY EVENING 120 tablet 5   lamoTRIgine (LAMICTAL) 200 MG tablet Take 1 tablet (200 mg total) by mouth daily. 90 tablet 0   mesalamine (APRISO) 0.375 g 24 hr capsule Take 4 capsules (1.5 g total) by mouth daily. 120 capsule 11   OLANZapine (ZYPREXA) 15 MG tablet Take 1 tablet (15 mg total) by mouth at bedtime. 90 tablet 0   No current facility-administered medications for this visit.    Medication Side Effects: None  Allergies:  Allergies  Allergen Reactions   Erythromycin     Pt unsure, reports he recently received a -mycin drug with no reaction.    Penicillins     REACTION: "it may kill me"   Tetracyclines & Related  Hives    Past Medical History:  Diagnosis Date   Anxiety    Crohn disease ([place])    pt reports subsequent testing was normal   Depression    GERD (gastroesophageal reflux disease)    Herpes simplex    Hoarding disorder    Hyperlipidemia    Hypertension    IBS (irritable bowel syndrome)    Incontinence  Family History  Problem Relation Age of Onset   Arthritis Mother    Stroke Mother    Depression Mother    Hypertension Father    Prostate cancer Father 51   Breast cancer Maternal Grandmother    Cancer Maternal Grandfather        unknown type   Cancer Paternal Grandmother        unknown type   Lung cancer Maternal Aunt    Prostate cancer Paternal Uncle    Prostate cancer Paternal Uncle    Colon cancer Neg Hx    Rectal cancer Neg Hx    Stomach cancer Neg Hx    Esophageal cancer Neg Hx    Pancreatic cancer Neg Hx     Social History   Socioeconomic History   Marital status: Single    Spouse name: Not on file   Number of children: Not on file   Years of education: Not on file   Highest education level: Not on file  Occupational History   Not on file  Tobacco Use   Smoking status: Former    Types: Cigarettes    Quit date: 1980    Years since quitting: 43.5   Smokeless tobacco: Never  Vaping Use   Vaping Use: Never used  Substance and Sexual Activity   Alcohol use: No   Drug use: No   Sexual activity: Not Currently  Other Topics Concern   Not on file  Social History Narrative   Lives alone.     Social Determinants of Health   Financial Resource Strain: Not on file  Food Insecurity: Not on file  Transportation Needs: Not on file  Physical Activity: Not on file  Stress: Not on file  Social Connections: Not on file  Intimate Partner Violence: Not on file    Past Medical History, Surgical history, Social history, and Family history were reviewed and updated as appropriate.   Please see review of systems for further details on the patient's review  from today.   Objective:   Physical Exam:  There were no vitals taken for this visit.  Physical Exam Constitutional:      General: He is not in acute distress.    Appearance: He is well-developed.  Musculoskeletal:        General: No deformity.  Neurological:     Mental Status: He is alert and oriented to person, place, and time.     Motor: No tremor.     Coordination: Coordination normal. Heel to Shin Test normal.  Psychiatric:        Attention and Perception: Attention normal. He is attentive.        Mood and Affect: Mood is anxious. Mood is not depressed. Affect is blunt. Affect is not labile, angry or inappropriate.        Speech: Speech is not rapid and pressured, delayed or slurred.        Behavior: Behavior normal. Behavior is not agitated or hyperactive.        Thought Content: Thought content is paranoid. Thought content does not include homicidal or suicidal ideation. Thought content does not include suicidal plan.        Cognition and Memory: Cognition normal.        Judgment: Judgment is not inappropriate.     Comments: Insight is fair. Not as depressed since back at work.  But still some Igained wt since here.     Lab Review:     Component Value Date/Time  NA 139 06/15/2021 1236   K 4.7 06/15/2021 1236   CL 98 06/15/2021 1236   CO2 28 06/15/2021 1236   GLUCOSE 134 (H) 06/15/2021 1236   GLUCOSE 103 10/28/2020 1708   BUN 14 06/15/2021 1236   CREATININE 1.06 06/15/2021 1236   CREATININE 1.13 10/28/2020 1708   CALCIUM 9.4 06/15/2021 1236   PROT 6.5 08/30/2020 1325   ALBUMIN 4.4 08/30/2020 1325   AST 17 08/30/2020 1325   ALT 16 08/30/2020 1325   ALKPHOS 88 08/30/2020 1325   BILITOT 0.5 08/30/2020 1325   GFRNONAA >60 03/04/2020 1941   GFRAA 90 09/11/2018 1120       Component Value Date/Time   WBC 8.7 08/30/2020 1325   WBC 9.2 03/04/2020 1941   RBC 4.46 08/30/2020 1325   RBC 4.26 03/04/2020 1941   HGB 13.9 08/30/2020 1325   HCT 40.6 08/30/2020 1325    PLT 341 08/30/2020 1325   MCV 91 08/30/2020 1325   MCH 31.2 08/30/2020 1325   MCH 32.2 03/04/2020 1941   MCHC 34.2 08/30/2020 1325   MCHC 35.1 03/04/2020 1941   RDW 12.1 08/30/2020 1325   LYMPHSABS 1.6 08/30/2020 1325   MONOABS 1.0 04/24/2019 1053   EOSABS 0.3 08/30/2020 1325   BASOSABS 0.1 08/30/2020 1325    Lithium Lvl  Date Value Ref Range Status  04/18/2018 <0.3 (L) 0.6 - 1.2 mmol/L Final    Comment:    Verified by repeat analysis. Marland Kitchen      10/26/2019 B12 [id number]  No results found for: "PHENYTOIN", "PHENOBARB", "VALPROATE", "CBMZ"   .res Assessment: Plan:    Husein was seen today for follow-up, depression and anxiety.  Diagnoses and all orders for this visit:  Recurrent major depression in partial remission ([place]) -     lamoTRIgine (LAMICTAL) 200 MG tablet; Take 1 tablet (200 mg total) by mouth daily. -     OLANZapine (ZYPREXA) 15 MG tablet; Take 1 tablet (15 mg total) by mouth at bedtime.  Hoarding disorder -     fluvoxaMINE (LUVOX) 100 MG tablet; TAKE 1 TABLET BY MOUTH EVERY MORNING THEN TAKE 3 TABLETS BY MOUTH EVERY EVENING  Mixed obsessional thoughts and acts -     LORazepam (ATIVAN) 0.5 MG tablet; 1-2 daily as needed for anxiety -     busPIRone (BUSPAR) 30 MG tablet; Take 1 tablet (30 mg total) by mouth 2 (two) times daily. -     fluvoxaMINE (LUVOX) 100 MG tablet; TAKE 1 TABLET BY MOUTH EVERY MORNING THEN TAKE 3 TABLETS BY MOUTH EVERY EVENING -     OLANZapine (ZYPREXA) 15 MG tablet; Take 1 tablet (15 mg total) by mouth at bedtime.  Social anxiety disorder -     LORazepam (ATIVAN) 0.5 MG tablet; 1-2 daily as needed for anxiety -     busPIRone (BUSPAR) 30 MG tablet; Take 1 tablet (30 mg total) by mouth 2 (two) times daily. -     fluvoxaMINE (LUVOX) 100 MG tablet; TAKE 1 TABLET BY MOUTH EVERY MORNING THEN TAKE 3 TABLETS BY MOUTH EVERY EVENING -     OLANZapine (ZYPREXA) 15 MG tablet; Take 1 tablet (15 mg total) by mouth at bedtime.  Mild cognitive impairment -      donepezil (ARICEPT) 10 MG tablet; Take 1 tablet (10 mg total) by mouth at bedtime.  Hyponatremia   [name] has been diagnosed with treatment resistant depression with psychotic features, OCD and social anxiety but schizoaffective may be more appropriate diagnosis given this chronic paranoid thinking.  Specifically he is chronically  suspicious that he has dementia despite neuropsychological testing which tells him he does not.  He also has chronic thoughts that other people find his odor offensive because of passing gas.  There is very little evidence to support this.  He has been under my psychiatric care for many many years and never owed noticed body odor and appointments at any time.  He also suspects that people are thinking negatively of him regularly.  Overall his paranoia and anxiety and depression are unchanged with reduction in olanzapine to 1m daily months ago .    Cognitve complaints could be related to untreated OSA. Pending sleep study results.  Hx hyponatremia.  Hoarding worsened since Luvox reduced to 300 mg daily so increased to 400 mg daily in 2022.  Discussed potential metabolic side effects associated with atypical antipsychotics, as well as potential risk for movement side effects. Advised pt to contact office if movement side effects occur.  No evidence of movement disorder. Still obsessing on farting and thinking others notice but it's never been noticeable in years of seeing him and he never gets complaints from other about it.  Discussed the risk of polypharmacy but again if appears medically necessary, helpful and well tolerated.  buspirone 30 mg twice daily, Aricept 10 mg daily, fluvoxamine 400 mg daily, lamotrigine 200 mg daily, lithium 150 nightly, olanzapine 15 mg nightly.  OK prn lorazepam usually less sedating than X anax 0.5-1.0 mg BID prn  FU 8 weeks  CLynder Parents MD, DFAPA  Please see After Visit Summary for patient specific instructions.  Future  Appointments  Date Time Provider [place] 07/17/2021  3:30 PM [facility]evin [facility]p [facility] 08/25[facility]20[facility]  2:00 PM [facility]us [facility]  8:30 AM LDenita Lung MD [facility]    No orders of the defined types were placed in this encounter.       -------------------------------

## 2021-07-17 ENCOUNTER — Other Ambulatory Visit (INDEPENDENT_AMBULATORY_CARE_PROVIDER_SITE_OTHER): Payer: 59

## 2021-07-17 ENCOUNTER — Telehealth: Payer: Self-pay

## 2021-07-17 ENCOUNTER — Ambulatory Visit: Payer: 59 | Admitting: Physician Assistant

## 2021-07-17 ENCOUNTER — Encounter: Payer: Self-pay | Admitting: Physician Assistant

## 2021-07-17 VITALS — BP 132/92 | HR 82 | Ht 75.0 in | Wt 262.0 lb

## 2021-07-17 DIAGNOSIS — K50818 Crohn's disease of both small and large intestine with other complication: Secondary | ICD-10-CM

## 2021-07-17 DIAGNOSIS — R197 Diarrhea, unspecified: Secondary | ICD-10-CM

## 2021-07-17 DIAGNOSIS — K8689 Other specified diseases of pancreas: Secondary | ICD-10-CM

## 2021-07-17 DIAGNOSIS — R159 Full incontinence of feces: Secondary | ICD-10-CM

## 2021-07-17 LAB — SEDIMENTATION RATE: Sed Rate: 28 mm/hr — ABNORMAL HIGH (ref 0–20)

## 2021-07-17 LAB — CBC WITH DIFFERENTIAL/PLATELET
Basophils Absolute: 0.1 10*3/uL (ref 0.0–0.1)
Basophils Relative: 1 % (ref 0.0–3.0)
Eosinophils Absolute: 0.1 10*3/uL (ref 0.0–0.7)
Eosinophils Relative: 1.1 % (ref 0.0–5.0)
HCT: 39.3 % (ref 39.0–52.0)
Hemoglobin: 13.6 g/dL (ref 13.0–17.0)
Lymphocytes Relative: 19.1 % (ref 12.0–46.0)
Lymphs Abs: 1.9 10*3/uL (ref 0.7–4.0)
MCHC: 34.7 g/dL (ref 30.0–36.0)
MCV: 92 fl (ref 78.0–100.0)
Monocytes Absolute: 0.9 10*3/uL (ref 0.1–1.0)
Monocytes Relative: 9.2 % (ref 3.0–12.0)
Neutro Abs: 7 10*3/uL (ref 1.4–7.7)
Neutrophils Relative %: 69.6 % (ref 43.0–77.0)
Platelets: 352 10*3/uL (ref 150.0–400.0)
RBC: 4.28 Mil/uL (ref 4.22–5.81)
RDW: 13.2 % (ref 11.5–15.5)
WBC: 10.1 10*3/uL (ref 4.0–10.5)

## 2021-07-17 MED ORDER — LOPERAMIDE HCL 2 MG PO CAPS
2.0000 mg | ORAL_CAPSULE | ORAL | 2 refills | Status: AC
Start: 2021-07-17 — End: ?

## 2021-07-17 NOTE — Patient Instructions (Signed)
Your provider has requested that you go to the basement level for lab work before leaving today. Press "B" on the elevator. The lab is located at the first door on the left as you exit the elevator.   We have sent the following medications to your pharmacy for you to pick up at your convenience:  Loperamide   Follow up per recommendations after lab results  Due to recent changes in healthcare laws, you may see the results of your imaging and laboratory studies on MyChart before your provider has had a chance to review them.  We understand that in some cases there may be results that are confusing or concerning to you. Not all laboratory results come back in the same time frame and the provider may be waiting for multiple results in order to interpret others.  Please give [place] 48 hours in order for your provider to thoroughly review all the results before contacting the office for clarification of your results.    If you are age 51 or older, your body mass index should be between 23-30. Your Body mass index is 32.75 kg/m. If this is out of the aforementioned range listed, please consider follow up with your Primary Care Provider.  If you are age 3 or younger, your body mass index should be between 19-25. Your Body mass index is 32.75 kg/m. If this is out of the aformentioned range listed, please consider follow up with your Primary Care Provider.   ________________________________________________________  The [facility] providers would like to encourage you to use [facility] to communicate with providers for non-urgent requests or questions.  Due to long hold times on the telephone, sending your provider a message by [facility] may be a faster and more efficient way to get a response.  Please allow 48 business hours for a response.  Please remember that this is for non-urgent requests.  _______________________________________________________   I appreciate the  opportunity to care for you  Thank You    Lovett Calender

## 2021-07-17 NOTE — Progress Notes (Signed)
Chief Complaint:Diarrhea, fecal incontinence, h/o Crohn's  HPI:    Zachary [name] is a 61 year old male with a past medical history as listed below including reflux and IBS, known to Dr. Fuller Plan, who was referred to me by Denita Lung, MD for a complaint of diarrhea, fecal incontinence and possible Crohn's disease.      01/25/2020 colonoscopy with a few erosions in the terminal ileum, 2 ulcers at the ileocecal valve, sessile sigmoid colon polyp and otherwise normal.  Patient was given Entocort 9 mg p.o. daily with 2 months of refills.  Repeat was recommended in 5 years.  Pathology showed mildly active chronic colitis    01/16/2021 office visit with Dr. Fuller Plan for Crohn's ileocolitis and pancreatic insufficiency.  At that time patient complained of malodorous stools and gas.  At that time he was not sure he was taking Apriso.  He was continued on Apriso 1.5 g every morning.  Also told to continue Creon 36,000 units 4 with meals and 2 with snacks.    06/19/2021 patient called and described fecal incontinence.  He was continued on Apriso 1.5 g daily and started on Budesonide 9 mg p.o. daily for 2 months and then stop.  Also continued on Creon.    07/03/2021 patient called and described suicidal thoughts.  His PCP thought this may be related to his Budesonide which was stopped.    07/13/2021 patient followed with his psychiatrist.  His medications were adjusted.  He is on a mixture of Buspirone, Aricept, Fluvoxamine, Lamotrigine, Lithium and Olanzapine.    Today, the patient expresses that he continues to have fecal incontinence, in fact while he has been sleeping he has had 3 episodes of incontinence.  Also tells me that whenever he passes gas he also has seepage of stool, at 1 point he even passed a solid formed looking stool.  Tells me that he is always having to go to the bathroom very urgently and has frequent loose stools often 5-6 or 7 times a day.  These interfere with his work schedule.  Describes it being  on the Budesonide for a month did not seem to help his symptoms and in fact maybe made it worse.  His antipsychotics have recently been adjusted by his psychiatrist.  Patient tells me he is getting depressed due to GI symptoms.    Denies fever, chills, weight loss or blood in his stool.  Past Medical History:  Diagnosis Date   Anxiety    Crohn disease ([place])    pt reports subsequent testing was normal   Depression    GERD (gastroesophageal reflux disease)    Herpes simplex    Hoarding disorder    Hyperlipidemia    Hypertension    IBS (irritable bowel syndrome)    Incontinence     Past Surgical History:  Procedure Laterality Date   APPENDECTOMY     COLONOSCOPY     POLYPECTOMY     TWISTED TESTICLES  1970   [place]    UMBILICAL HERNIA REPAIR      Current Outpatient Medications  Medication Sig Dispense Refill   aspirin 81 MG EC tablet Take 81 mg by mouth daily. Swallow whole.     budesonide (ENTOCORT EC) 3 MG 24 hr capsule TAKE 3 CAPSULES BY MOUTH EVERY DAY 90 capsule 5   busPIRone (BUSPAR) 30 MG tablet Take 1 tablet (30 mg total) by mouth 2 (two) times daily. 180 tablet 0   CREON 36000-114000 units CPEP capsule TAKE 4 CAPSULES BY  MOUTH BEFORE A MEAL& 2 WITH EACH SNACK 1440 capsule 3   donepezil (ARICEPT) 10 MG tablet Take 1 tablet (10 mg total) by mouth at bedtime. 90 tablet 3   fluvoxaMINE (LUVOX) 100 MG tablet TAKE 1 TABLET BY MOUTH EVERY MORNING THEN TAKE 3 TABLETS BY MOUTH EVERY EVENING 120 tablet 5   lamoTRIgine (LAMICTAL) 200 MG tablet Take 1 tablet (200 mg total) by mouth daily. 90 tablet 0   levothyroxine (SYNTHROID) 75 MCG tablet TAKE 1 TABLET(75 MCG) BY MOUTH DAILY 90 tablet 1   lithium carbonate 150 MG capsule TAKE 1 CAPSULE(150 MG) BY MOUTH DAILY 30 capsule 0   LORazepam (ATIVAN) 0.5 MG tablet 1-2 daily as needed for anxiety 30 tablet 0   losartan-hydrochlorothiazide (HYZAAR) 100-12.5 MG tablet Take 1 tablet by mouth daily.     mesalamine (APRISO) 0.375 g 24 hr  capsule Take 4 capsules (1.5 g total) by mouth daily. 120 capsule 11   Multiple Vitamin (MULTIVITAMIN WITH MINERALS) TABS Take 1 tablet by mouth daily.     OLANZapine (ZYPREXA) 15 MG tablet Take 1 tablet (15 mg total) by mouth at bedtime. 90 tablet 0   sodium chloride 1 g tablet TAKE 1 TABLET(1 GRAM) BY MOUTH THREE TIMES DAILY 90 tablet 0   No current facility-administered medications for this visit.    Allergies as of 07/17/2021 - Review Complete 07/13/2021  Allergen Reaction Noted   Erythromycin  12/18/2017   Penicillins  08/11/2007   Tetracyclines & related Hives 06/26/2010    Family History  Problem Relation Age of Onset   Arthritis Mother    Stroke Mother    Depression Mother    Hypertension Father    Prostate cancer Father 66   Breast cancer Maternal Grandmother    Cancer Maternal Grandfather        unknown type   Cancer Paternal Grandmother        unknown type   Lung cancer Maternal Aunt    Prostate cancer Paternal Uncle    Prostate cancer Paternal Uncle    Colon cancer Neg Hx    Rectal cancer Neg Hx    Stomach cancer Neg Hx    Esophageal cancer Neg Hx    Pancreatic cancer Neg Hx     Social History   Socioeconomic History   Marital status: Single    Spouse name: Not on file   Number of children: Not on file   Years of education: Not on file   Highest education level: Not on file  Occupational History   Not on file  Tobacco Use   Smoking status: Former    Types: Cigarettes    Quit date: 1980    Years since quitting: 43.5   Smokeless tobacco: Never  Vaping Use   Vaping Use: Never used  Substance and Sexual Activity   Alcohol use: No   Drug use: No   Sexual activity: Not Currently  Other Topics Concern   Not on file  Social History Narrative   Lives alone.     Social Determinants of Health   Financial Resource Strain: Not on file  Food Insecurity: Not on file  Transportation Needs: Not on file  Physical Activity: Not on file  Stress: Not on  file  Social Connections: Not on file  Intimate Partner Violence: Not on file    Review of Systems:    Constitutional: No weight loss, fever or chills Cardiovascular: No chest pain Respiratory: No SOB  Gastrointestinal: See HPI and otherwise   negative   Physical Exam:  Vital signs: BP (!) 132/92   Pulse 82   Ht 6' 3" (1.905 m)   Wt 262 lb (118.8 kg)   BMI 32.75 kg/m    Constitutional:   Pleasant Caucasian male appears to be in NAD, Well developed, Well nourished, alert and cooperative Respiratory: Respirations even and unlabored. Lungs clear to auscultation bilaterally.   No wheezes, crackles, or rhonchi.  Cardiovascular: Normal S1, S2. No MRG. Regular rate and rhythm. No peripheral edema, cyanosis or pallor.  Gastrointestinal:  Soft, nondistended, nontender. No rebound or guarding. Increased BS all four quadrants. No appreciable masses or hepatomegaly. Rectal:  Not performed.  Psychiatric:  Demonstrates good judgement and reason without abnormal affect or behaviors.  RELEVANT LABS AND IMAGING: CBC    Component Value Date/Time   WBC 8.7 08/30/2020 1325   WBC 9.2 03/04/2020 1941   RBC 4.46 08/30/2020 1325   RBC 4.26 03/04/2020 1941   HGB 13.9 08/30/2020 1325   HCT 40.6 08/30/2020 1325   PLT 341 08/30/2020 1325   MCV 91 08/30/2020 1325   MCH 31.2 08/30/2020 1325   MCH 32.2 03/04/2020 1941   MCHC 34.2 08/30/2020 1325   MCHC 35.1 03/04/2020 1941   RDW 12.1 08/30/2020 1325   LYMPHSABS 1.6 08/30/2020 1325   MONOABS 1.0 04/24/2019 1053   EOSABS 0.3 08/30/2020 1325   BASOSABS 0.1 08/30/2020 1325    CMP     Component Value Date/Time   NA 139 06/15/2021 1236   K 4.7 06/15/2021 1236   CL 98 06/15/2021 1236   CO2 28 06/15/2021 1236   GLUCOSE 134 (H) 06/15/2021 1236   GLUCOSE 103 10/28/2020 1708   BUN 14 06/15/2021 1236   CREATININE 1.06 06/15/2021 1236   CREATININE 1.13 10/28/2020 1708   CALCIUM 9.4 06/15/2021 1236   PROT 6.5 08/30/2020 1325   ALBUMIN 4.4 08/30/2020  1325   AST 17 08/30/2020 1325   ALT 16 08/30/2020 1325   ALKPHOS 88 08/30/2020 1325   BILITOT 0.5 08/30/2020 1325   GFRNONAA >60 03/04/2020 1941   GFRAA 90 09/11/2018 1120    Assessment: 1.  Crohn's ileocolitis: Last colonoscopy with active Crohn's disease, no benefit from recent Budesonide, continues with fecal incontinence and loose stools 2.  Pancreatic insufficiency: Currently on Creon 36,000 lipase units 4 with a meal and 2 with a snack, could be playing a role in ongoing diarrhea 3.  Fecal incontinence/loose stools: Consider relation to all of the above +/- IBS and weak pelvic floor  Plan: 1.  Discussed with patient that he has a lot of diagnoses which could be aiding in his diarrhea and incontinence.  This could be related to his pancreatic insufficiency or Crohn's disease or his multitude of antipsychotic medications. 2.  For now ordered labs including a CBC, CMP, ESR and CRP. 3.  Also ordered stool studies including a GI pathogen panel, fecal calprotectin, fecal lactoferrin and O&P. 4.  Patient tells me his symptoms worsened with Budesonide recently and he also started having suicidal ideations.  We will hold off on repeat Budesonide or Prednisone at the moment, though of course these may be needed in the future if he is having a flare of his Crohn's.  He also may need alternative chronic therapy.  He has been on Apriso for some time. Could also consider repeat flex sig or colonoscopy to decipher the state of his Crohn's. 5.  Discussed using Loperamide 2 mg every 4 hours as needed for diarrhea in  the interim.  Prescribed #60 with 3 refills. 6.  Could also consider anal manometry in the future and pelvic floor therapy. 7.  Patient to follow in clinic with me or Dr. Stark per recommendations after above.   , PA-C  Gastroenterology 07/17/2021, 3:26 PM  Cc: Lalonde, John C, MD  

## 2021-07-17 NOTE — Telephone Encounter (Signed)
Lvm for pt to call back to make a follow up appt concerning his recent sleep study.  When appt is made please advise because forms are at my desk . [place]

## 2021-07-18 ENCOUNTER — Other Ambulatory Visit: Payer: Self-pay

## 2021-07-18 LAB — COMPREHENSIVE METABOLIC PANEL
ALT: 35 U/L (ref 0–53)
AST: 20 U/L (ref 0–37)
Albumin: 4.4 g/dL (ref 3.5–5.2)
Alkaline Phosphatase: 57 U/L (ref 39–117)
BUN: 9 mg/dL (ref 6–23)
CO2: 27 mEq/L (ref 19–32)
Calcium: 9.1 mg/dL (ref 8.4–10.5)
Chloride: 96 mEq/L (ref 96–112)
Creatinine, Ser: 1.08 mg/dL (ref 0.40–1.50)
GFR: 75.12 mL/min (ref >60.00)
Glucose, Bld: 96 mg/dL (ref 70–99)
Potassium: 3.1 mEq/L — ABNORMAL LOW (ref 3.5–5.1)
Sodium: 135 mEq/L (ref 135–145)
Total Bilirubin: 0.4 mg/dL (ref 0.2–1.2)
Total Protein: 7.1 g/dL (ref 6.0–8.3)

## 2021-07-18 LAB — HIGH SENSITIVITY CRP: CRP, High Sensitivity: 3.1 mg/L (ref 0.000–5.000)

## 2021-07-18 MED ORDER — POTASSIUM CHLORIDE CRYS ER 20 MEQ PO TBCR: 40.0000 meq | EXTENDED_RELEASE_TABLET | Freq: Once | ORAL | 0 refills | Status: DC

## 2021-07-19 ENCOUNTER — Telehealth: Payer: Self-pay | Admitting: *Deleted

## 2021-07-19 LAB — FECAL LACTOFERRIN, QUANT
Fecal Lactoferrin: NEGATIVE
MICRO NUMBER:: 13661700
SPECIMEN QUALITY:: ADEQUATE

## 2021-07-19 NOTE — Telephone Encounter (Signed)
Left message for pt to call, fax received from [employer], they have been unable to reach the patient to get home hook-up completed. Ask patient to call and let us know if he received the monitor and if he plans on wearing it.

## 2021-07-19 NOTE — Telephone Encounter (Signed)
Needs appt ASAP to discuss abnormal sleep study and next steps  I have the results, but I am not his PCP.  So schedule preferably with [employer]

## 2021-07-20 ENCOUNTER — Other Ambulatory Visit: Payer: Self-pay | Admitting: Psychiatry

## 2021-07-20 DIAGNOSIS — F422 Mixed obsessional thoughts and acts: Secondary | ICD-10-CM

## 2021-07-20 DIAGNOSIS — F4010 Social phobia, unspecified: Secondary | ICD-10-CM

## 2021-07-20 DIAGNOSIS — G3184 Mild cognitive impairment, so stated: Secondary | ICD-10-CM

## 2021-07-20 DIAGNOSIS — F423 Hoarding disorder: Secondary | ICD-10-CM

## 2021-07-20 LAB — GI PROFILE, STOOL, PCR

## 2021-07-20 LAB — CALPROTECTIN, FECAL: Calprotectin, Fecal: 96 ug/g (ref 0–120)

## 2021-07-21 ENCOUNTER — Emergency Department (HOSPITAL_COMMUNITY)
Admission: EM | Admit: 2021-07-21 | Discharge: 2021-07-21 | Disposition: A | Discharge status: Home / Self Care | Payer: 59 | Attending: Emergency Medicine | Admitting: Emergency Medicine

## 2021-07-21 ENCOUNTER — Emergency Department (HOSPITAL_COMMUNITY): Payer: 59

## 2021-07-21 ENCOUNTER — Encounter (HOSPITAL_COMMUNITY): Payer: Self-pay

## 2021-07-21 ENCOUNTER — Ambulatory Visit: Payer: 59 | Admitting: Medical

## 2021-07-21 ENCOUNTER — Other Ambulatory Visit: Payer: Self-pay

## 2021-07-21 DIAGNOSIS — W231XXA Caught, crushed, jammed, or pinched between stationary objects, initial encounter: Secondary | ICD-10-CM | POA: Diagnosis not present

## 2021-07-21 DIAGNOSIS — Z79899 Other long term (current) drug therapy: Secondary | ICD-10-CM | POA: Diagnosis not present

## 2021-07-21 DIAGNOSIS — Z7982 Long term (current) use of aspirin: Secondary | ICD-10-CM | POA: Diagnosis not present

## 2021-07-21 DIAGNOSIS — S99922A Unspecified injury of left foot, initial encounter: Secondary | ICD-10-CM | POA: Insufficient documentation

## 2021-07-21 LAB — OVA AND PARASITE EXAMINATION
CONCENTRATE RESULT:: NONE SEEN
MICRO NUMBER:: 13661558
SPECIMEN QUALITY:: ADEQUATE
TRICHROME RESULT:: NONE SEEN

## 2021-07-21 NOTE — ED Provider Notes (Signed)
[facility] DEPT Provider Note   CSN: [id number] Arrival date & time: 07/21/21  1447     History  Chief Complaint  Patient presents with   Foot Injury    Zachary [name] is a 61 y.o. male.  61 year old male with no past medical history presents to the ED status post left foot injury.  Patient reports he had a fracture to his left foot approximately a month ago, 1 week ago he dropped a heavy box approximately 50 pounds onto his left foot.  He endorses pain to the area, has taken some over-the-counter medication with improvement in symptoms.  However, today prior to arrival in the ED he began to feel numbness to his entire foot.  No alleviating or exacerbating factors.  Although, after his left shoe was removed he reports resolution of numbness.  He is concerned for worsening nerve damage to his left foot.  Does not have any prior history of diabetes or neuropathy.  Denies any other injury.  The history is provided by the patient.  Foot Injury Location:  Foot Time since incident:  1 week Injury: yes   Mechanism of injury: crush   Crush:    Mechanism:  Falling object   Approximate weight of object:  50 lbs Foot location:  L foot Pain details:    Quality:  Aching and tingling Associated symptoms: no fever        Home Medications Prior to Admission medications   Medication Sig Start Date End Date Taking? Authorizing Provider  aspirin 81 MG EC tablet Take 81 mg by mouth daily. Swallow whole.    [provider]  budesonide (ENTOCORT EC) 3 MG 24 hr capsule TAKE 3 CAPSULES BY MOUTH EVERY DAY 04/11/21   Ladene Artist, MD  busPIRone (BUSPAR) 30 MG tablet Take 1 tablet (30 mg total) by mouth 2 (two) times daily. 07/13/21   Cottle, Billey Co., MD  CREON [phone] units CPEP capsule TAKE 4 CAPSULES BY MOUTH BEFORE A MEAL& 2 WITH [facility] SNACK 11/14/20   Zehr, Janett Billow D, PA-C  donepezil (ARICEPT) 10 MG tablet Take 1 tablet (10 mg total) by mouth  at bedtime. 07/13/21   Cottle, Billey Co., MD  fluvoxaMINE (LUVOX) 100 MG tablet TAKE 1 TABLET BY MOUTH EVERY MORNING THEN TAKE 3 TABLETS BY MOUTH EVERY EVENING 07/13/21   Cottle, Billey Co., MD  lamoTRIgine (LAMICTAL) 200 MG tablet Take 1 tablet (200 mg total) by mouth daily. 07/13/21   Cottle, Billey Co., MD  levothyroxine (SYNTHROID) 75 MCG tablet TAKE 1 TABLET(75 MCG) BY MOUTH DAILY 01/17/21   Denita Lung, MD  lithium carbonate 150 MG capsule TAKE 1 CAPSULE(150 MG) BY MOUTH DAILY 07/20/21   Cottle, Billey Co., MD  loperamide (IMODIUM) 2 MG capsule Take 1 capsule (2 mg total) by mouth every 4 (four) hours. As needed 07/17/21   Levin Erp, PA  LORazepam (ATIVAN) 0.5 MG tablet 1-2 daily as needed for anxiety 07/13/21   Cottle, Billey Co., MD  losartan-hydrochlorothiazide (HYZAAR) 100-12.5 MG tablet Take 1 tablet by mouth daily. 03/31/21   [provider]  mesalamine (APRISO) 0.375 g 24 hr capsule Take 4 capsules (1.5 g total) by mouth daily. 01/16/21 07/07/21  Ladene Artist, MD  Multiple Vitamin (MULTIVITAMIN WITH MINERALS) TABS Take 1 tablet by mouth daily.    [provider]  OLANZapine (ZYPREXA) 15 MG tablet Take 1 tablet (15 mg total) by mouth at bedtime. 07/13/21  Cottle, Billey Co., MD  potassium chloride SA (KLOR-CON M) 20 MEQ tablet Take 2 tablets (40 mEq total) by mouth once for 1 dose. 07/18/21 07/18/21  Levin Erp, PA  sodium chloride 1 g tablet TAKE 1 TABLET(1 GRAM) BY MOUTH THREE TIMES DAILY 04/17/21   Cottle, Billey Co., MD      Allergies    Erythromycin, Penicillins, and Tetracyclines & related    Review of Systems   Review of Systems  Constitutional:  Negative for fever.  Respiratory:  Negative for shortness of breath.   Cardiovascular:  Negative for chest pain.  Gastrointestinal:  Negative for abdominal pain.  Genitourinary:  Negative for flank pain.  Musculoskeletal:  Positive for arthralgias.  All other systems reviewed and are  negative.   Physical Exam Updated Vital Signs BP 136/88 (BP Location: Left Arm)   Pulse 62   Temp 98 F (36.7 C) (Oral)   Resp 16   Ht 6' 3"  (1.905 m)   Wt 117.9 kg   SpO2 100%   BMI 32.50 kg/m  Physical Exam Vitals and nursing note reviewed.  Constitutional:      Appearance: Normal appearance.  HENT:     Head: Normocephalic and atraumatic.  Eyes:     Pupils: Pupils are equal, round, and reactive to light.  Cardiovascular:     Rate and Rhythm: Normal rate.     Pulses:          Dorsalis pedis pulses are 2+ on the left side.       Posterior tibial pulses are 2+ on the left side.  Pulmonary:     Effort: Pulmonary effort is normal.  Abdominal:     General: Abdomen is flat.  Musculoskeletal:     Cervical back: Normal range of motion and neck supple.  Feet:     Left foot:     Skin integrity: Erythema and callus present. No ulcer, blister, skin breakdown, warmth or dry skin.     Toenail Condition: Left toenails are long.     Comments: DP, PT 2+ pulses.  Capillary refills intact.  Sensation is intact throughout.  5 out of 5 strength with dorsi flexion and plantarflexion. Skin:    General: Skin is warm and dry.  Neurological:     Mental Status: He is alert and oriented to person, place, and time.     ED Results / Procedures / Treatments   Labs (all labs ordered are listed, but only abnormal results are displayed) Labs Reviewed - No data to display  EKG None  Radiology DG Foot Complete Left  Result Date: 07/21/2021 CLINICAL DATA:  Trauma, pain EXAM: LEFT FOOT - COMPLETE 3+ VIEW COMPARISON:  None Available. FINDINGS: Comminuted fractures are seen in necks of left third and fourth metatarsals. There is callus formation around the fracture sites. Degenerative changes are noted in first metatarsophalangeal joint with bony spurs. Bony spurs are noted in the dorsal aspect of the intertarsal and tarsometatarsal joints. Plantar spur is seen in calcaneus. Smoothly marginated  calcifications at the attachment of Achilles tendon to the calcaneus may suggest calcific tendinosis. IMPRESSION: Comminuted fractures are seen in the neck of left third and fourth metatarsals with surrounding callus formation suggesting healing fractures. No definite recent fracture is seen. Other findings as described in the body of the report. Electronically Signed   By: Elmer Picker M.D.   On: 07/21/2021 17:49   DG Ankle Complete Left  Result Date: 07/21/2021 CLINICAL DATA:  Injury EXAM:  LEFT ANKLE COMPLETE - 3 VIEW COMPARISON:  None Available. FINDINGS: There is no evidence of fracture, dislocation, or joint effusion. Moderate degenerative changes of the midfoot. Small calcaneal spur and Achilles tendon enthesophyte. Soft tissues are unremarkable. IMPRESSION: No acute osseous abnormality. Electronically Signed   By: Yetta Glassman M.D.   On: 07/21/2021 16:35    Procedures Procedures    Medications Ordered in ED Medications - No data to display  ED Course/ Medical Decision Making/ A&P                           Medical Decision Making Amount and/or Complexity of Data Reviewed Radiology: ordered.  Patient presents to the ED status post left foot injury.  Patient has a prior fracture to his left foot, came in today as he dropped a 50 pound weight onto his left foot the dorsum aspect of it.  Endorsing pain along the area, however was alarmed today as he felt tingling sensation to the dorsum aspect of first foot.  Exacerbated with wearing his shoe.  During evaluation and removing his shoe, patient reports that the tingling has now resolved after taking off his shoe.  No prior history of diabetes, or prior history of diabetic neuropathy.  During evaluation there is 2+ pulses DP, PT.  Sensation is intact throughout, good dorsiflexion and plantarflexion.  X-ray of the left foot reviewed and interpreted by me: Left ankle x-ray without any acute findings.  Foot x-ray showed Comminuted  fractures are seen in the neck of left third and fourth  metatarsals with surrounding callus formation suggesting healing  fractures. No definite recent fracture is seen.    Other findings as described in the body of the report.   Results were discussed at length with patient.  He is agreeable with outpatient follow-up.  I do not feel that he needs crutches at this time, although he inquired about these.  He is overall in stable condition.  Patient stable for discharge.   Portions of this note were generated with [employer]. Dictation errors may occur despite best attempts at proofreading.   Final Clinical Impression(s) / ED Diagnoses Final diagnoses:  Injury of left foot, initial encounter    Rx / DC Orders ED Discharge Orders     None         Janeece Fitting, PA-C 07/21/21 1757    Charlesetta Shanks, MD 07/23/21 1235

## 2021-07-21 NOTE — ED Triage Notes (Signed)
Patient reports that he dropped a box weighing over 50 lbs onto his left foot 2 days ago while at work.

## 2021-07-21 NOTE — Discharge Instructions (Signed)
We discussed the results of your x-ray.  You may continue to wear shoes with good support.  You may schedule an appointment with orthopedics if you feel this is needed in the next couple months.  If your symptoms do worsen please make an appointment with your primary care physician.

## 2021-07-24 ENCOUNTER — Telehealth: Payer: Self-pay | Admitting: Cardiology

## 2021-07-26 ENCOUNTER — Telehealth: Payer: Self-pay | Admitting: Gastroenterology

## 2021-07-26 ENCOUNTER — Encounter: Payer: Self-pay | Admitting: Family Medicine

## 2021-07-26 ENCOUNTER — Telehealth: Payer: 59 | Admitting: Family Medicine

## 2021-07-26 ENCOUNTER — Other Ambulatory Visit: Payer: Self-pay

## 2021-07-26 VITALS — Ht 74.0 in | Wt 260.0 lb

## 2021-07-26 DIAGNOSIS — R159 Full incontinence of feces: Secondary | ICD-10-CM

## 2021-07-26 DIAGNOSIS — K50818 Crohn's disease of both small and large intestine with other complication: Secondary | ICD-10-CM

## 2021-07-26 DIAGNOSIS — G4733 Obstructive sleep apnea (adult) (pediatric): Secondary | ICD-10-CM

## 2021-07-26 DIAGNOSIS — K8689 Other specified diseases of pancreas: Secondary | ICD-10-CM

## 2021-07-26 DIAGNOSIS — R197 Diarrhea, unspecified: Secondary | ICD-10-CM

## 2021-07-26 MED ORDER — POTASSIUM CHLORIDE CRYS ER 20 MEQ PO TBCR: 40.0000 meq | EXTENDED_RELEASE_TABLET | Freq: Once | ORAL | 0 refills | Status: DC

## 2021-07-26 NOTE — Progress Notes (Signed)
   Subjective:    Patient ID: Zachary [name], male    DOB: September 01, 1960, 61 y.o.   MRN: [id number]  HPI Documentation for virtual audio and video telecommunications through [place] encounter:  The patient was located at home. 2 patient identifiers used.  The provider was located in the office. The patient did consent to this visit and is aware of possible charges through their insurance for this visit. The other persons participating in this telemedicine service were none. Time spent on call was 5 minutes and in review of previous records >20 minutes total for counseling and coordination of care. This virtual service is not related to other E/M service within previous 7 days.  He recently had a home sleep study done.  The AHI was 61.5 indicating severe sleep apnea.  Review of Systems     Objective:   Physical Exam Alert and in no distress otherwise not examined       Assessment & Plan:  OSA (obstructive sleep apnea) - Plan: For home use only DME continuous positive airway pressure (CPAP) I discussed the diagnosis of sleep apnea with him in regard to using the CPAP to improve energy and stamina.  Explained the fact that this needs to be for the rest of his life.  Did not discuss weight reduction with him at the present time.  He is to be fitted for this, use a sling for 1 month, keep track of the benefits and we will reevaluate the data to make sure that he is definitely improving.  He might also be a candidate for BiPAP because of his size.

## 2021-07-26 NOTE — Telephone Encounter (Signed)
Called and spoke with patient. See 7/17 lab result note and 7/18 stool study result note for details.

## 2021-07-26 NOTE — Telephone Encounter (Signed)
Pt returned call. He left me a vm asking to give him a call after 4:05 pm.

## 2021-07-26 NOTE — Telephone Encounter (Signed)
Lm on vm for patient to return call 

## 2021-07-26 NOTE — Telephone Encounter (Signed)
Inbound call from patient returning call.  Please give a call back to advise.  Thank you

## 2021-07-29 ENCOUNTER — Other Ambulatory Visit: Payer: Self-pay | Admitting: Family Medicine

## 2021-07-29 DIAGNOSIS — E039 Hypothyroidism, unspecified: Secondary | ICD-10-CM

## 2021-08-01 ENCOUNTER — Telehealth: Payer: Self-pay

## 2021-08-01 NOTE — Telephone Encounter (Signed)
-----   Message from Yevette Edwards, RN sent at 07/26/2021  4:40 PM EDT ----- Regarding: Labs CMET due tomorrow- order in epic Call patient after 4:05 pm

## 2021-08-01 NOTE — Telephone Encounter (Signed)
Left pt a detailed vm. I told him that I was calling to make sure he picked up the potassium prescription for last week. He will be due for labs tomorrow to recheck potassium level. I asked that patient give me a call back.

## 2021-08-02 ENCOUNTER — Other Ambulatory Visit (INDEPENDENT_AMBULATORY_CARE_PROVIDER_SITE_OTHER): Payer: 59

## 2021-08-02 DIAGNOSIS — K50818 Crohn's disease of both small and large intestine with other complication: Secondary | ICD-10-CM | POA: Diagnosis not present

## 2021-08-02 DIAGNOSIS — R159 Full incontinence of feces: Secondary | ICD-10-CM

## 2021-08-02 DIAGNOSIS — R197 Diarrhea, unspecified: Secondary | ICD-10-CM

## 2021-08-02 DIAGNOSIS — K8689 Other specified diseases of pancreas: Secondary | ICD-10-CM | POA: Diagnosis not present

## 2021-08-02 LAB — COMPREHENSIVE METABOLIC PANEL
ALT: 27 U/L (ref 0–53)
AST: 19 U/L (ref 0–37)
Albumin: 4.5 g/dL (ref 3.5–5.2)
Alkaline Phosphatase: 62 U/L (ref 39–117)
BUN: 8 mg/dL (ref 6–23)
CO2: 28 mEq/L (ref 19–32)
Calcium: 9 mg/dL (ref 8.4–10.5)
Chloride: 94 mEq/L — ABNORMAL LOW (ref 96–112)
Creatinine, Ser: 1.12 mg/dL (ref 0.40–1.50)
GFR: 71.89 mL/min (ref >60.00)
Glucose, Bld: 100 mg/dL — ABNORMAL HIGH (ref 70–99)
Potassium: 3.1 mEq/L — ABNORMAL LOW (ref 3.5–5.1)
Sodium: 132 mEq/L — ABNORMAL LOW (ref 135–145)
Total Bilirubin: 0.6 mg/dL (ref 0.2–1.2)
Total Protein: 7.2 g/dL (ref 6.0–8.3)

## 2021-08-02 NOTE — Telephone Encounter (Signed)
Left detailed message on vm for patient reminding him that he is due for labs today after taking Potassium prescription last week. I advised pt to call back to let us know he received the message and to let us know if he will be coming for labs.

## 2021-08-03 ENCOUNTER — Other Ambulatory Visit: Payer: Self-pay | Admitting: Psychiatry

## 2021-08-03 DIAGNOSIS — F3341 Major depressive disorder, recurrent, in partial remission: Secondary | ICD-10-CM

## 2021-08-03 NOTE — Telephone Encounter (Signed)
Pt came for labs yesterday.

## 2021-08-11 ENCOUNTER — Ambulatory Visit (INDEPENDENT_AMBULATORY_CARE_PROVIDER_SITE_OTHER): Payer: 59

## 2021-08-11 DIAGNOSIS — R000 Tachycardia, unspecified: Secondary | ICD-10-CM

## 2021-08-14 ENCOUNTER — Encounter: Payer: Self-pay | Admitting: *Deleted

## 2021-08-15 ENCOUNTER — Telehealth (INDEPENDENT_AMBULATORY_CARE_PROVIDER_SITE_OTHER): Payer: 59 | Admitting: Family Medicine

## 2021-08-15 ENCOUNTER — Encounter: Payer: Self-pay | Admitting: Family Medicine

## 2021-08-15 VITALS — BP 111/82 | HR 90

## 2021-08-15 DIAGNOSIS — R112 Nausea with vomiting, unspecified: Secondary | ICD-10-CM

## 2021-08-15 MED ORDER — ONDANSETRON 8 MG PO TBDP: 8.0000 mg | ORAL_TABLET | Freq: Three times a day (TID) | ORAL | 0 refills | Status: DC | PRN

## 2021-08-15 NOTE — Progress Notes (Signed)
   Subjective:    Patient ID: Zachary [name], male    DOB: December 22, 1960, 61 y.o.   MRN: [id number]  HPI Documentation for virtual audio and video telecommunications through [place] encounter:  The patient was located at home. 2 patient identifiers used.  The provider was located in the office. The patient did consent to this visit and is aware of possible charges through their insurance for this visit. The other persons participating in this telemedicine service were none. Time spent on call was 5 minutes and in review of previous records >15 minutes total for counseling and coordination of care. This virtual service is not related to other E/M service within previous 7 days.  He states that yesterday he had some presyncopal type feelings and did vomit once or twice.  This has continued today but no fever, chills, diarrhea, sore throat, earache, cough or congestion.  He has been in bed because he was afraid that he would get sick and vomit.  Review of Systems     Objective:   Physical Exam Alert and in no distress otherwise not examined       Assessment & Plan:  Nausea and vomiting, unspecified vomiting type - Plan: ondansetron (ZOFRAN-ODT) 8 MG disintegrating tablet I explained that this is probably a viral type illness and we will give him something for the nausea.  Then encouraged him to drink sips of liquids until he gets his hydration status better.  Explained that I think he would be fine to go to work tomorrow.  He was comfortable with that.

## 2021-08-16 ENCOUNTER — Telehealth: Payer: Self-pay

## 2021-08-16 DIAGNOSIS — K8689 Other specified diseases of pancreas: Secondary | ICD-10-CM

## 2021-08-16 DIAGNOSIS — E876 Hypokalemia: Secondary | ICD-10-CM

## 2021-08-16 DIAGNOSIS — K50818 Crohn's disease of both small and large intestine with other complication: Secondary | ICD-10-CM

## 2021-08-16 DIAGNOSIS — R159 Full incontinence of feces: Secondary | ICD-10-CM

## 2021-08-16 MED ORDER — POTASSIUM CHLORIDE CRYS ER 20 MEQ PO TBCR: 40.0000 meq | EXTENDED_RELEASE_TABLET | Freq: Once | ORAL | 0 refills | Status: DC

## 2021-08-16 NOTE — Telephone Encounter (Signed)
Pt called into the office today. He received my detailed vm with his lab results and Dr. Lynne Leader recommendations. Pt states that he did take the potassium that was prescribed. I told pt that we will send in another dose and he will need repeat labs next week. Pt knows that I will leave him a detailed message with a lab  reminder. Pt advised that it is important that he comes in for repeat labs. Pt knows that if he continues to have electrolyte abnormalities we will refer him back to his PCP for further recommendations. Pt stated that our front office was understaffed and it was hard for him to get in touch with me. I offered to set patient up for MyChart but he declined to do that at this time. Pt is aware that I will send in additional dose of Potassium to his pharmacy on file. Pt verbalized understanding and had no concerns at the end of the call.   Lab order and reminder in epic.

## 2021-08-24 ENCOUNTER — Telehealth: Payer: Self-pay

## 2021-08-24 DIAGNOSIS — R072 Precordial pain: Secondary | ICD-10-CM | POA: Insufficient documentation

## 2021-08-24 NOTE — Progress Notes (Signed)
Cardiology Office Note   Date:  08/25/2021   ID:  Ahmaad, Neidhardt 24-Jun-1960, MRN [id number]  PCP:  Denita Lung, MD  Cardiologist:   None Referring:  Denita Lung, MD   Chief Complaint  Patient presents with   Palpitations     History of Present Illness: Zachary [name] is a 61 y.o. male who was referred by Denita Lung, MD for palpitations.  Since I last saw him he wore a monitor that demonstrated only normal sinus rhythm.  He really did not have any symptoms while he was wearing this.  He has not really had any palpitations, presyncope or syncope.  He has had no chest pressure, neck or arm discomfort.  He had no weight gain or edema.  He does check his blood pressures and he kept a diary for me.  It is typically well controlled.   Past Medical History:  Diagnosis Date   Anxiety    Crohn disease ([place])    pt reports subsequent testing was normal   Depression    GERD (gastroesophageal reflux disease)    Herpes simplex    Hoarding disorder    Hyperlipidemia    Hypertension    IBS (irritable bowel syndrome)    Incontinence     Past Surgical History:  Procedure Laterality Date   APPENDECTOMY     COLONOSCOPY     POLYPECTOMY     TWISTED TESTICLES  1970   [place]    UMBILICAL HERNIA REPAIR       Current Outpatient Medications  Medication Sig Dispense Refill   aspirin 81 MG EC tablet Take 81 mg by mouth daily. Swallow whole.     budesonide (ENTOCORT EC) 3 MG 24 hr capsule TAKE 3 CAPSULES BY MOUTH EVERY DAY 90 capsule 5   busPIRone (BUSPAR) 30 MG tablet Take 1 tablet (30 mg total) by mouth 2 (two) times daily. 180 tablet 0   CREON 36000-114000 units CPEP capsule TAKE 4 CAPSULES BY MOUTH BEFORE A MEAL& 2 WITH EACH SNACK 1440 capsule 3   donepezil (ARICEPT) 10 MG tablet Take 1 tablet (10 mg total) by mouth at bedtime. 90 tablet 3   fluvoxaMINE (LUVOX) 100 MG tablet TAKE 1 TABLET BY MOUTH EVERY MORNING THEN TAKE 3 TABLETS BY MOUTH EVERY EVENING 120  tablet 5   lamoTRIgine (LAMICTAL) 200 MG tablet Take 1 tablet (200 mg total) by mouth daily. 90 tablet 0   levothyroxine (SYNTHROID) 75 MCG tablet TAKE 1 TABLET(75 MCG) BY MOUTH DAILY 90 tablet 0   lithium carbonate 150 MG capsule TAKE 1 CAPSULE(150 MG) BY MOUTH DAILY 30 capsule 1   loperamide (IMODIUM) 2 MG capsule Take 1 capsule (2 mg total) by mouth every 4 (four) hours. As needed 60 capsule 2   LORazepam (ATIVAN) 0.5 MG tablet 1-2 daily as needed for anxiety 30 tablet 0   losartan-hydrochlorothiazide (HYZAAR) 100-12.5 MG tablet Take 1 tablet by mouth daily.     mesalamine (APRISO) 0.375 g 24 hr capsule Take 4 capsules (1.5 g total) by mouth daily. 120 capsule 11   OLANZapine (ZYPREXA) 15 MG tablet Take 1 tablet (15 mg total) by mouth at bedtime. 90 tablet 0   sodium chloride 1 g tablet TAKE 1 TABLET(1 GRAM) BY MOUTH THREE TIMES DAILY 90 tablet 0   No current facility-administered medications for this visit.    Allergies:   Erythromycin, Penicillins, and Tetracyclines & related    ROS:  Please see the history  of present illness.   Otherwise, review of systems are positive for none.   All other systems are reviewed and negative.    PHYSICAL EXAM: VS:  BP 120/83   Pulse 68   Ht 6' 3"  (1.905 m)   Wt 239 lb (108.4 kg)   SpO2 95%   BMI 29.87 kg/m  , BMI Body mass index is 29.87 kg/m. GENERAL:  Well appearing NECK:  No jugular venous distention, waveform within normal limits, carotid upstroke brisk and symmetric, possible right carotid bruits, no thyromegaly LUNGS:  Clear to auscultation bilaterally CHEST:  Unremarkable HEART:  PMI not displaced or sustained,S1 and S2 within normal limits, no S3, no S4, no clicks, no rubs, no murmurs ABD:  Flat, positive bowel sounds normal in frequency in pitch, no bruits, no rebound, no guarding, no midline pulsatile mass, no hepatomegaly, no splenomegaly EXT:  2 plus pulses throughout, no edema, no cyanosis no clubbing   EKG:  EKG is not ordered  today.   Recent Labs: 02/03/2021: TSH 0.756 07/17/2021: Hemoglobin 13.6; Platelets 352.0 08/02/2021: ALT 27; BUN 8; Creatinine, Ser 1.12; Potassium 3.1; Sodium 132    Lipid Panel    Component Value Date/Time   CHOL 186 07/16/2018 1028   TRIG 161 (H) 07/16/2018 1028   HDL 53 07/16/2018 1028   CHOLHDL 3.5 07/16/2018 1028   CHOLHDL 3.6 07/30/2013 0913   VLDL 56 (H) 07/30/2013 0913   LDLCALC 101 (H) 07/16/2018 1028      Wt Readings from Last 3 Encounters:  08/25/21 239 lb (108.4 kg)  08/25/21 242 lb 12.8 oz (110.1 kg)  07/26/21 260 lb (117.9 kg)      Other studies Reviewed: Additional studies/ records that were reviewed today include: BP diary, labs Review of the above records demonstrates:  Please see elsewhere in the note.     ASSESSMENT AND PLAN:  Tachycardia:   He had normal sinus rhythm.   He is not having any new symptoms.  No further change in therapy or testing is indicated.   Chest discomfort: He is describing no chest discomfort at this point.  No further testing is indicated.  Hypertension: Blood pressure is controlled and he can continue the meds as listed.   Bruit: I will check carotid Dopplers   Risk reduction: He has a strong family history of strokes.  I do not see a recent lipid profile.  I will check a lipid profile.    Current medicines are reviewed at length with the patient today.  The patient does not have concerns regarding medicines.  The following changes have been made:  None  Labs/ tests ordered today include:   Orders Placed This Encounter  Procedures   Lipid panel   VAS US CAROTID     Disposition:   FU with me as needed   Signed, Minus Breeding, MD  08/25/2021 2:59 PM    [place]

## 2021-08-24 NOTE — Telephone Encounter (Signed)
-----   Message from Yevette Edwards, RN sent at 08/16/2021  4:52 PM EDT ----- Regarding: Lab BMET due - order is in epic Leave patient a detailed vm reminder

## 2021-08-24 NOTE — Telephone Encounter (Signed)
Left pt a detailed vm with lab reminder. Advised that he needs to stop by this week for lab after taking additional dose of potassium that was prescribed on 08/16/21. I advised pt to call back with any questions or concerns.

## 2021-08-25 ENCOUNTER — Ambulatory Visit: Payer: 59 | Admitting: Family Medicine

## 2021-08-25 ENCOUNTER — Encounter: Payer: Self-pay | Admitting: Cardiology

## 2021-08-25 ENCOUNTER — Encounter: Payer: Self-pay | Admitting: Family Medicine

## 2021-08-25 ENCOUNTER — Ambulatory Visit (INDEPENDENT_AMBULATORY_CARE_PROVIDER_SITE_OTHER): Payer: 59 | Admitting: Cardiology

## 2021-08-25 ENCOUNTER — Other Ambulatory Visit (INDEPENDENT_AMBULATORY_CARE_PROVIDER_SITE_OTHER): Payer: 59

## 2021-08-25 VITALS — BP 120/83 | HR 68 | Ht 75.0 in | Wt 239.0 lb

## 2021-08-25 VITALS — BP 114/78 | HR 63 | Temp 96.3°F | Wt 242.8 lb

## 2021-08-25 DIAGNOSIS — I10 Essential (primary) hypertension: Secondary | ICD-10-CM

## 2021-08-25 DIAGNOSIS — J029 Acute pharyngitis, unspecified: Secondary | ICD-10-CM | POA: Diagnosis not present

## 2021-08-25 DIAGNOSIS — K8689 Other specified diseases of pancreas: Secondary | ICD-10-CM | POA: Diagnosis not present

## 2021-08-25 DIAGNOSIS — R0989 Other specified symptoms and signs involving the circulatory and respiratory systems: Secondary | ICD-10-CM

## 2021-08-25 DIAGNOSIS — K50818 Crohn's disease of both small and large intestine with other complication: Secondary | ICD-10-CM | POA: Diagnosis not present

## 2021-08-25 DIAGNOSIS — R000 Tachycardia, unspecified: Secondary | ICD-10-CM | POA: Diagnosis not present

## 2021-08-25 DIAGNOSIS — G4700 Insomnia, unspecified: Secondary | ICD-10-CM | POA: Diagnosis not present

## 2021-08-25 DIAGNOSIS — E876 Hypokalemia: Secondary | ICD-10-CM

## 2021-08-25 DIAGNOSIS — R072 Precordial pain: Secondary | ICD-10-CM

## 2021-08-25 LAB — BASIC METABOLIC PANEL
BUN: 14 mg/dL (ref 6–23)
CO2: 33 mEq/L — ABNORMAL HIGH (ref 19–32)
Calcium: 9.2 mg/dL (ref 8.4–10.5)
Chloride: 97 mEq/L (ref 96–112)
Creatinine, Ser: 1.36 mg/dL (ref 0.40–1.50)
GFR: 56.92 mL/min — ABNORMAL LOW (ref >60.00)
Glucose, Bld: 131 mg/dL — ABNORMAL HIGH (ref 70–99)
Potassium: 3.5 mEq/L (ref 3.5–5.1)
Sodium: 139 mEq/L (ref 135–145)

## 2021-08-25 NOTE — Telephone Encounter (Signed)
Pt came in for labs.

## 2021-08-25 NOTE — Patient Instructions (Signed)
Medication Instructions:  Your physician recommends that you continue on your current medications as directed. Please refer to the Current Medication list given to you today.  *If you need a refill on your cardiac medications before your next appointment, please call your pharmacy*   Lab Work: Your physician recommends that you return for lab work in: For FASTING lipid panel  If you have labs (blood work) drawn today and your tests are completely normal, you will receive your results only by: MyChart Message (if you have MyChart) OR A paper copy in the mail If you have any lab test that is abnormal or we need to change your treatment, we will call you to review the results.   Testing/Procedures: Your physician has requested that you have a carotid duplex. This test is an ultrasound of the carotid arteries in your neck. It looks at blood flow through these arteries that supply the brain with blood. Allow one hour for this exam. There are no restrictions or special instructions. This procedure will be done at [place]. [address]    Follow-Up: At [facility], you and your health needs are our priority.  As part of our continuing mission to provide you with exceptional heart care, we have created designated Provider Care Teams.  These Care Teams include your primary Cardiologist (physician) and Advanced Practice Providers (APPs -  Physician Assistants and Nurse Practitioners) who all work together to provide you with the care you need, when you need it.  We recommend signing up for the patient portal called "MyChart".  Sign up information is provided on this After Visit Summary.  MyChart is used to connect with patients for Virtual Visits (Telemedicine).  Patients are able to view lab/test results, encounter notes, upcoming appointments, etc.  Non-urgent messages can be sent to your provider as well.   To learn more about what you can do with MyChart, go to [email or web address].     Your next appointment:   We will see you on an as needed basis.   Provider:   Minus Breeding, MD

## 2021-08-25 NOTE — Progress Notes (Signed)
   Subjective:    Patient ID: Zachary [name], male    DOB: [birth year], 62 y.o.   MRN: [id number]  HPI He complains of a 2-week history of sore throat but no fever, chills.  Does complain of some slight left ear pain as well as difficulty with sleep.   Review of Systems     Objective:   Physical Exam Alert and in no distress. Tympanic membranes and canals are normal. Pharyngeal area is normal. Neck is supple without adenopathy or thyromegaly. Cardiac exam shows a regular sinus rhythm without murmurs or gallops. Lungs are clear to auscultation. Strep test negative       Assessment & Plan:  Sore throat  Insomnia, unspecified type I reassured him that I found nothing of any concern and recommended supportive care.  Also recommend using melatonin on a regular basis at 6 mg dosing.

## 2021-08-28 ENCOUNTER — Ambulatory Visit (HOSPITAL_BASED_OUTPATIENT_CLINIC_OR_DEPARTMENT_OTHER)
Admission: RE | Admit: 2021-08-28 | Discharge: 2021-08-28 | Disposition: A | Discharge status: Home / Self Care | Payer: 59 | Source: Ambulatory Visit | Attending: Cardiology | Admitting: Cardiology

## 2021-08-28 DIAGNOSIS — E876 Hypokalemia: Secondary | ICD-10-CM | POA: Diagnosis not present

## 2021-08-28 DIAGNOSIS — R0989 Other specified symptoms and signs involving the circulatory and respiratory systems: Secondary | ICD-10-CM

## 2021-08-28 DIAGNOSIS — F332 Major depressive disorder, recurrent severe without psychotic features: Secondary | ICD-10-CM | POA: Diagnosis not present

## 2021-08-28 DIAGNOSIS — T43222A Poisoning by selective serotonin reuptake inhibitors, intentional self-harm, initial encounter: Secondary | ICD-10-CM | POA: Diagnosis not present

## 2021-08-28 DIAGNOSIS — T50902A Poisoning by unspecified drugs, medicaments and biological substances, intentional self-harm, initial encounter: Secondary | ICD-10-CM | POA: Diagnosis not present

## 2021-08-28 DIAGNOSIS — F429 Obsessive-compulsive disorder, unspecified: Secondary | ICD-10-CM | POA: Diagnosis not present

## 2021-08-29 ENCOUNTER — Encounter (HOSPITAL_COMMUNITY): Payer: Self-pay | Admitting: Emergency Medicine

## 2021-08-29 ENCOUNTER — Encounter: Payer: Self-pay | Admitting: Family Medicine

## 2021-08-29 ENCOUNTER — Other Ambulatory Visit: Payer: Self-pay

## 2021-08-29 ENCOUNTER — Telehealth: Payer: Self-pay

## 2021-08-29 ENCOUNTER — Inpatient Hospital Stay (HOSPITAL_COMMUNITY)
Admission: EM | Admit: 2021-08-29 | Discharge: 2021-08-31 | DRG: 918 | Disposition: A | Discharge status: Other Acute Inpatient Hospital | Payer: 59 | Attending: Internal Medicine | Admitting: Internal Medicine

## 2021-08-29 ENCOUNTER — Telehealth: Payer: 59 | Admitting: Family Medicine

## 2021-08-29 VITALS — Ht 75.0 in | Wt 239.0 lb

## 2021-08-29 DIAGNOSIS — K580 Irritable bowel syndrome with diarrhea: Secondary | ICD-10-CM | POA: Diagnosis present

## 2021-08-29 DIAGNOSIS — K219 Gastro-esophageal reflux disease without esophagitis: Secondary | ICD-10-CM | POA: Diagnosis present

## 2021-08-29 DIAGNOSIS — F332 Major depressive disorder, recurrent severe without psychotic features: Secondary | ICD-10-CM

## 2021-08-29 DIAGNOSIS — F429 Obsessive-compulsive disorder, unspecified: Secondary | ICD-10-CM

## 2021-08-29 DIAGNOSIS — E876 Hypokalemia: Secondary | ICD-10-CM

## 2021-08-29 DIAGNOSIS — I10 Essential (primary) hypertension: Secondary | ICD-10-CM | POA: Diagnosis present

## 2021-08-29 DIAGNOSIS — T1491XA Suicide attempt, initial encounter: Principal | ICD-10-CM

## 2021-08-29 DIAGNOSIS — I129 Hypertensive chronic kidney disease with stage 1 through stage 4 chronic kidney disease, or unspecified chronic kidney disease: Secondary | ICD-10-CM | POA: Diagnosis present

## 2021-08-29 DIAGNOSIS — E785 Hyperlipidemia, unspecified: Secondary | ICD-10-CM | POA: Diagnosis present

## 2021-08-29 DIAGNOSIS — T50902A Poisoning by unspecified drugs, medicaments and biological substances, intentional self-harm, initial encounter: Secondary | ICD-10-CM | POA: Diagnosis not present

## 2021-08-29 DIAGNOSIS — Z880 Allergy status to penicillin: Secondary | ICD-10-CM

## 2021-08-29 DIAGNOSIS — K8689 Other specified diseases of pancreas: Secondary | ICD-10-CM | POA: Diagnosis present

## 2021-08-29 DIAGNOSIS — Z79890 Hormone replacement therapy (postmenopausal): Secondary | ICD-10-CM

## 2021-08-29 DIAGNOSIS — Z823 Family history of stroke: Secondary | ICD-10-CM

## 2021-08-29 DIAGNOSIS — F423 Hoarding disorder: Secondary | ICD-10-CM | POA: Diagnosis present

## 2021-08-29 DIAGNOSIS — Y929 Unspecified place or not applicable: Secondary | ICD-10-CM

## 2021-08-29 DIAGNOSIS — F329 Major depressive disorder, single episode, unspecified: Secondary | ICD-10-CM | POA: Diagnosis present

## 2021-08-29 DIAGNOSIS — Z87891 Personal history of nicotine dependence: Secondary | ICD-10-CM

## 2021-08-29 DIAGNOSIS — Z9189 Other specified personal risk factors, not elsewhere classified: Secondary | ICD-10-CM | POA: Diagnosis present

## 2021-08-29 DIAGNOSIS — T50901A Poisoning by unspecified drugs, medicaments and biological substances, accidental (unintentional), initial encounter: Secondary | ICD-10-CM | POA: Diagnosis present

## 2021-08-29 DIAGNOSIS — Z79899 Other long term (current) drug therapy: Secondary | ICD-10-CM

## 2021-08-29 DIAGNOSIS — E039 Hypothyroidism, unspecified: Secondary | ICD-10-CM | POA: Diagnosis present

## 2021-08-29 DIAGNOSIS — R45851 Suicidal ideations: Secondary | ICD-10-CM | POA: Diagnosis present

## 2021-08-29 DIAGNOSIS — N182 Chronic kidney disease, stage 2 (mild): Secondary | ICD-10-CM | POA: Diagnosis present

## 2021-08-29 DIAGNOSIS — Z8249 Family history of ischemic heart disease and other diseases of the circulatory system: Secondary | ICD-10-CM

## 2021-08-29 DIAGNOSIS — Z046 Encounter for general psychiatric examination, requested by authority: Secondary | ICD-10-CM

## 2021-08-29 DIAGNOSIS — Z888 Allergy status to other drugs, medicaments and biological substances status: Secondary | ICD-10-CM

## 2021-08-29 DIAGNOSIS — F419 Anxiety disorder, unspecified: Secondary | ICD-10-CM | POA: Diagnosis present

## 2021-08-29 DIAGNOSIS — K5090 Crohn's disease, unspecified, without complications: Secondary | ICD-10-CM | POA: Diagnosis present

## 2021-08-29 DIAGNOSIS — Z20822 Contact with and (suspected) exposure to covid-19: Secondary | ICD-10-CM | POA: Diagnosis present

## 2021-08-29 DIAGNOSIS — Z818 Family history of other mental and behavioral disorders: Secondary | ICD-10-CM

## 2021-08-29 DIAGNOSIS — Z8261 Family history of arthritis: Secondary | ICD-10-CM

## 2021-08-29 DIAGNOSIS — Z7982 Long term (current) use of aspirin: Secondary | ICD-10-CM

## 2021-08-29 DIAGNOSIS — Z8719 Personal history of other diseases of the digestive system: Secondary | ICD-10-CM

## 2021-08-29 DIAGNOSIS — T43222A Poisoning by selective serotonin reuptake inhibitors, intentional self-harm, initial encounter: Principal | ICD-10-CM | POA: Diagnosis present

## 2021-08-29 LAB — MAGNESIUM: Magnesium: 2.1 mg/dL (ref 1.7–2.4)

## 2021-08-29 LAB — COMPREHENSIVE METABOLIC PANEL
ALT: 34 U/L (ref 0–44)
AST: 32 U/L (ref 15–41)
Albumin: 4 g/dL (ref 3.5–5.0)
Alkaline Phosphatase: 64 U/L (ref 38–126)
Anion gap: 13 (ref 5–15)
BUN: 15 mg/dL (ref 6–20)
CO2: 22 mmol/L (ref 22–32)
Calcium: 8.7 mg/dL — ABNORMAL LOW (ref 8.9–10.3)
Chloride: 100 mmol/L (ref 98–111)
Creatinine, Ser: 1.51 mg/dL — ABNORMAL HIGH (ref 0.61–1.24)
GFR, Estimated: 53 mL/min — ABNORMAL LOW (ref >60)
Glucose, Bld: 124 mg/dL — ABNORMAL HIGH (ref 70–99)
Potassium: 2.2 mmol/L — CL (ref 3.5–5.1)
Sodium: 135 mmol/L (ref 135–145)
Total Bilirubin: 0.8 mg/dL (ref 0.3–1.2)
Total Protein: 7 g/dL (ref 6.5–8.1)

## 2021-08-29 LAB — CBC
HCT: 44.4 % (ref 39.0–52.0)
Hemoglobin: 15.3 g/dL (ref 13.0–17.0)
MCH: 31.4 pg (ref 26.0–34.0)
MCHC: 34.5 g/dL (ref 30.0–36.0)
MCV: 91.2 fL (ref 80.0–100.0)
Platelets: 402 10*3/uL — ABNORMAL HIGH (ref 150–400)
RBC: 4.87 MIL/uL (ref 4.22–5.81)
RDW: 12.4 % (ref 11.5–15.5)
WBC: 12.7 10*3/uL — ABNORMAL HIGH (ref 4.0–10.5)
nRBC: 0 % (ref 0.0–0.2)

## 2021-08-29 LAB — RESP PANEL BY RT-PCR (FLU A&B, COVID) ARPGX2
Influenza A by PCR: NEGATIVE
Influenza B by PCR: NEGATIVE
SARS Coronavirus 2 by RT PCR: NEGATIVE

## 2021-08-29 LAB — ETHANOL: Alcohol, Ethyl (B): <10 mg/dL (ref <10)

## 2021-08-29 LAB — RAPID URINE DRUG SCREEN, HOSP PERFORMED
Amphetamines: NOT DETECTED
Barbiturates: NOT DETECTED
Benzodiazepines: NOT DETECTED
Cocaine: NOT DETECTED
Opiates: NOT DETECTED
Tetrahydrocannabinol: NOT DETECTED

## 2021-08-29 LAB — SALICYLATE LEVEL: Salicylate Lvl: <7 mg/dL — ABNORMAL LOW (ref 7.0–30.0)

## 2021-08-29 LAB — ACETAMINOPHEN LEVEL: Acetaminophen (Tylenol), Serum: <10 ug/mL — ABNORMAL LOW (ref 10–30)

## 2021-08-29 MED ORDER — PANCRELIPASE (LIP-PROT-AMYL) 36000-114000 UNITS PO CPEP
36000.0000 [IU] | ORAL_CAPSULE | Freq: Three times a day (TID) | ORAL | Status: DC
Start: 2021-08-30 — End: 2021-08-31
  Administered 2021-08-30 – 2021-08-31 (×6): 36000 [IU] via ORAL
  Filled 2021-08-29 (×6): qty 1

## 2021-08-29 MED ORDER — HYDROCHLOROTHIAZIDE 12.5 MG PO TABS: 12.5000 mg | ORAL_TABLET | Freq: Every day | ORAL | Status: DC

## 2021-08-29 MED ORDER — ACETAMINOPHEN 325 MG PO TABS: 650.0000 mg | ORAL_TABLET | Freq: Four times a day (QID) | ORAL | Status: DC | PRN

## 2021-08-29 MED ORDER — POTASSIUM CHLORIDE CRYS ER 20 MEQ PO TBCR: 40.0000 meq | EXTENDED_RELEASE_TABLET | Freq: Three times a day (TID) | ORAL | Status: DC

## 2021-08-29 MED ORDER — SODIUM CHLORIDE 0.9% FLUSH: 3.0000 mL | INTRAVENOUS | Status: DC | PRN

## 2021-08-29 MED ORDER — LOSARTAN POTASSIUM 50 MG PO TABS
100.0000 mg | ORAL_TABLET | Freq: Every day | ORAL | Status: DC
  Administered 2021-08-30 – 2021-08-31 (×2): 100 mg via ORAL
  Filled 2021-08-29 (×2): qty 2

## 2021-08-29 MED ORDER — LAMOTRIGINE 100 MG PO TABS
200.0000 mg | ORAL_TABLET | Freq: Every day | ORAL | Status: DC
  Administered 2021-08-30 – 2021-08-31 (×2): 200 mg via ORAL
  Filled 2021-08-29 (×2): qty 2

## 2021-08-29 MED ORDER — PANCRELIPASE (LIP-PROT-AMYL) 12000-38000 UNITS PO CPEP
36000.0000 [IU] | ORAL_CAPSULE | Freq: Once | ORAL | Status: AC
  Administered 2021-08-29: 36000 [IU] via ORAL
  Filled 2021-08-29: qty 3

## 2021-08-29 MED ORDER — LEVOTHYROXINE SODIUM 75 MCG PO TABS
75.0000 ug | ORAL_TABLET | Freq: Every day | ORAL | Status: DC
  Administered 2021-08-30 – 2021-08-31 (×2): 75 ug via ORAL
  Filled 2021-08-29 (×2): qty 1

## 2021-08-29 MED ORDER — ONDANSETRON HCL 4 MG PO TABS: 4.0000 mg | ORAL_TABLET | Freq: Four times a day (QID) | ORAL | Status: DC | PRN

## 2021-08-29 MED ORDER — SODIUM CHLORIDE 0.9 % IV SOLN
INTRAVENOUS | Status: AC
Start: 2021-08-29 — End: 2021-08-30

## 2021-08-29 MED ORDER — ASPIRIN 81 MG PO TBEC
81.0000 mg | DELAYED_RELEASE_TABLET | Freq: Every day | ORAL | Status: DC
  Administered 2021-08-30 – 2021-08-31 (×2): 81 mg via ORAL
  Filled 2021-08-29 (×2): qty 1

## 2021-08-29 MED ORDER — SODIUM CHLORIDE 0.9 % IV SOLN: 250.0000 mL | INTRAVENOUS | Status: DC | PRN

## 2021-08-29 MED ORDER — SODIUM CHLORIDE 0.9 % IV BOLUS
1000.0000 mL | Freq: Once | INTRAVENOUS | Status: AC
  Administered 2021-08-29: 1000 mL via INTRAVENOUS

## 2021-08-29 MED ORDER — ACETAMINOPHEN 650 MG RE SUPP: 650.0000 mg | Freq: Four times a day (QID) | RECTAL | Status: DC | PRN

## 2021-08-29 MED ORDER — MESALAMINE ER 0.375 G PO CP24
1.5000 g | ORAL_CAPSULE | Freq: Every day | ORAL | Status: DC
Start: 2021-08-29 — End: 2021-08-29

## 2021-08-29 MED ORDER — LOSARTAN POTASSIUM-HCTZ 100-12.5 MG PO TABS
1.0000 | ORAL_TABLET | Freq: Every day | ORAL | Status: DC
Start: 2021-08-30 — End: 2021-08-29

## 2021-08-29 MED ORDER — MESALAMINE ER 250 MG PO CPCR
1500.0000 mg | ORAL_CAPSULE | Freq: Every day | ORAL | Status: DC
  Administered 2021-08-30 – 2021-08-31 (×2): 1500 mg via ORAL
  Filled 2021-08-29 (×2): qty 6

## 2021-08-29 MED ORDER — POTASSIUM CHLORIDE 10 MEQ/100ML IV SOLN
10.0000 meq | INTRAVENOUS | Status: AC
  Administered 2021-08-29 (×6): 10 meq via INTRAVENOUS
  Filled 2021-08-29 (×6): qty 100

## 2021-08-29 MED ORDER — ONDANSETRON HCL 4 MG/2ML IJ SOLN
4.0000 mg | Freq: Four times a day (QID) | INTRAMUSCULAR | Status: DC | PRN
  Administered 2021-08-30: 4 mg via INTRAVENOUS
  Filled 2021-08-29: qty 2

## 2021-08-29 MED ORDER — LOPERAMIDE HCL 2 MG PO CAPS
2.0000 mg | ORAL_CAPSULE | ORAL | Status: DC | PRN
Start: 2021-08-29 — End: 2021-08-31

## 2021-08-29 MED ORDER — LORAZEPAM 0.5 MG PO TABS
0.5000 mg | ORAL_TABLET | Freq: Two times a day (BID) | ORAL | Status: DC | PRN
  Administered 2021-08-30: 0.5 mg via ORAL
  Filled 2021-08-29: qty 1

## 2021-08-29 MED ORDER — SODIUM CHLORIDE 0.9% FLUSH
3.0000 mL | Freq: Two times a day (BID) | INTRAVENOUS | Status: DC
  Administered 2021-08-30 – 2021-08-31 (×2): 3 mL via INTRAVENOUS

## 2021-08-29 MED ORDER — BUDESONIDE 3 MG PO CPEP
9.0000 mg | ORAL_CAPSULE | Freq: Every day | ORAL | Status: DC
  Administered 2021-08-30 – 2021-08-31 (×2): 9 mg via ORAL
  Filled 2021-08-29 (×2): qty 3

## 2021-08-29 MED ORDER — ENOXAPARIN SODIUM 40 MG/0.4ML IJ SOSY
40.0000 mg | PREFILLED_SYRINGE | INTRAMUSCULAR | Status: DC
  Administered 2021-08-29 – 2021-08-30 (×2): 40 mg via SUBCUTANEOUS
  Filled 2021-08-29 (×2): qty 0.4

## 2021-08-29 MED ORDER — POTASSIUM CHLORIDE CRYS ER 20 MEQ PO TBCR
40.0000 meq | EXTENDED_RELEASE_TABLET | Freq: Once | ORAL | Status: AC
  Administered 2021-08-29: 40 meq via ORAL
  Filled 2021-08-29: qty 2

## 2021-08-29 NOTE — Progress Notes (Signed)
   Subjective:    Patient ID: Zachary [name], male    DOB: 22-Jan-1960, 61 y.o.   MRN: [id number]  HPI At the beginning of the day he called the office and wanted to talk to me but would not give information concerning what the conversation was going to be.  He then talked to Maudie Mercury, my CMA.  He initially said it was difficult to discuss this but apparently went to try to kill himself.  He apparently has not eaten in 3 days.  He finally did admit that he did try to kill himself.  She tried to get more information from him.  She told him that I would call him and I did call him back several minutes later.  He then admitted that he did try to kill himself by taking as many as 30 Luvox last night.  I then asked him if he was suicidal or homicidal and he said he was not at the present time.  I then explained that since he did take the overdose of that medication he would need to be cleared medically before anything else could be taken care of.  He then admitted that the reason behind this was that he apparently proposition someone who he found out later was under age 6 and that he would rather die than to deal with this.  Him to promise that he would go to the emergency room to get further evaluated medically and that he would not drive and either get someone to take him there or call an ambulance.  Told him that I would be aware of his emergency room visit because it would show up in the medical record. Review of Systems     Objective:   Physical Exam Alert and lucid with normal speech pattern       Assessment & Plan:  Suicide attempt [facility])  As mentioned above I documented the history and he gave me a verbal commitment they will go to the emergency room to get medically cleared and at that point we will then deal with the underlying psychological issues.  Also discussed the fact that since at this point there has been nothing legal done, we will do with this when and if it occurs.

## 2021-08-29 NOTE — Assessment & Plan Note (Signed)
Secondary to GI losses from diarrhea as well as from HCTZ use. Supplement potassium Check magnesium levels

## 2021-08-29 NOTE — Assessment & Plan Note (Signed)
Patient presents to the ER for evaluation after an intentional overdose. He admits to taking about 30 to 40 tablets of Luvox 100 mg Discussed with poison control who states that its been more than 24 hours since ingestion and nothing to monitor at this time Patient is currently involuntarily committed Place patient on one-to-one suicide precautions Discussed with psychiatrist, Dr Cloyde Reams and patient will be seen in consultation Continue supportive care

## 2021-08-29 NOTE — Assessment & Plan Note (Signed)
Stable

## 2021-08-29 NOTE — Assessment & Plan Note (Signed)
Stable Continue Creon

## 2021-08-29 NOTE — ED Notes (Signed)
3 bags of patient belongings placed behind the hall C nurses station.

## 2021-08-29 NOTE — ED Triage Notes (Signed)
Pt bib ems after ingestion with intention of self harm at 1600 yesterday. Pt took 30-40 131m fluvoxamine tables at that time. Pt refused cardiac monitoring with EMS. Pt now with nausea and feeling unwell.  110/74 HR 90 RR 16 97% RA CBG 109

## 2021-08-29 NOTE — Telephone Encounter (Signed)
Pt all the office and would not advise anyone of what he needed to be seen for. After some time pt advised that this is very hard for him to talk about and that his friend was or has tried to kill themself. I was uneasy about the way pt was expressing that it was a friend so I spoke to Dr. Redmond School. Pt was called back to advised he would have to wait until 1:30 for his appt with Dr. Redmond School at that time pt advised that he had not ate in three days and when asked again was it him that he was talking about he then told me it was. Pt was asked if he was home so that we could send some help and he then became upset and would not give [place] any other info. Advised my supervisor of pt brother phone number and Dr. Redmond School got on the virtual visit.   Elyse Jarvis RMA

## 2021-08-29 NOTE — ED Notes (Signed)
Hospitalist at bedside. Pt to be moved to room 24 after assessment.

## 2021-08-29 NOTE — Assessment & Plan Note (Signed)
Blood pressure is stable Continue HCTZ/losartan

## 2021-08-29 NOTE — Assessment & Plan Note (Signed)
Stable Continue budesonide and mesalamine as maintenance therapy

## 2021-08-29 NOTE — ED Provider Notes (Cosign Needed Addendum)
[facility] DEPT Provider Note   CSN: [id number] Arrival date & time: 08/29/21  1110     History  Chief Complaint  Patient presents with   Suicide Attempt    Zachary [name] is a 61 y.o. male with medical history of anxiety, Crohn's disease, GERD, herpes, hoarding disorder, IBS.  Patient presents to ED for evaluation of overdose.  Patient reports that day prior he attempted to overdose and take his life by ingesting 30-40 Luvox tablets 100 mg each.  Patient called his PCP this morning stating that he had done this, would not tell his PCP why he did this.  Eventually the patient revealed that he attempted to take his own life due to the fact that he propositioned a person under the age of 15 recently.  Patient PCP advised the patient to present to the ER for further evaluation which the patient did do.  The patient currently is denying any suicidal or homicidal ideation, denies any audiovisual hallucinations.  After questioning, the patient finally does reveal that he attempted to take his life today prior, his intent was to kill himself.  Currently the patient is endorsing nausea without vomiting.  The patient denies any chest pain, shortness of breath, diarrhea, abdominal pain, flank pain, fevers, dysuria.  HPI     Home Medications Prior to Admission medications   Medication Sig Start Date End Date Taking? Authorizing Provider  aspirin 81 MG EC tablet Take 81 mg by mouth daily. Swallow whole.    [provider]  budesonide (ENTOCORT EC) 3 MG 24 hr capsule TAKE 3 CAPSULES BY MOUTH EVERY DAY 04/11/21   Ladene Artist, MD  busPIRone (BUSPAR) 30 MG tablet Take 1 tablet (30 mg total) by mouth 2 (two) times daily. 07/13/21   Cottle, Billey Co., MD  CREON [phone] units CPEP capsule TAKE 4 CAPSULES BY MOUTH BEFORE A MEAL& 2 WITH [facility] SNACK 11/14/20   Zehr, Janett Billow D, PA-C  donepezil (ARICEPT) 10 MG tablet Take 1 tablet (10 mg total) by mouth at  bedtime. 07/13/21   Cottle, Billey Co., MD  fluvoxaMINE (LUVOX) 100 MG tablet TAKE 1 TABLET BY MOUTH EVERY MORNING THEN TAKE 3 TABLETS BY MOUTH EVERY EVENING 07/13/21   Cottle, Billey Co., MD  lamoTRIgine (LAMICTAL) 200 MG tablet Take 1 tablet (200 mg total) by mouth daily. 07/13/21   Cottle, Billey Co., MD  levothyroxine (SYNTHROID) 75 MCG tablet TAKE 1 TABLET(75 MCG) BY MOUTH DAILY 07/31/21   Denita Lung, MD  lithium carbonate 150 MG capsule TAKE 1 CAPSULE(150 MG) BY MOUTH DAILY 07/20/21   Cottle, Billey Co., MD  loperamide (IMODIUM) 2 MG capsule Take 1 capsule (2 mg total) by mouth every 4 (four) hours. As needed 07/17/21   Levin Erp, PA  LORazepam (ATIVAN) 0.5 MG tablet 1-2 daily as needed for anxiety 07/13/21   Cottle, Billey Co., MD  losartan-hydrochlorothiazide (HYZAAR) 100-12.5 MG tablet Take 1 tablet by mouth daily. 03/31/21   [provider]  mesalamine (APRISO) 0.375 g 24 hr capsule Take 4 capsules (1.5 g total) by mouth daily. 01/16/21 08/29/21  Ladene Artist, MD  OLANZapine (ZYPREXA) 15 MG tablet Take 1 tablet (15 mg total) by mouth at bedtime. 07/13/21   Cottle, Billey Co., MD  sodium chloride 1 g tablet TAKE 1 TABLET(1 GRAM) BY MOUTH THREE TIMES DAILY 04/17/21   Cottle, Billey Co., MD      Allergies  Erythromycin, Penicillins, and Tetracyclines & related    Review of Systems   Review of Systems  Constitutional:  Negative for fever.  Respiratory:  Negative for shortness of breath.   Cardiovascular:  Negative for chest pain.  Gastrointestinal:  Positive for nausea. Negative for abdominal pain, diarrhea and vomiting.  Genitourinary:  Negative for dysuria and flank pain.  All other systems reviewed and are negative.   Physical Exam Updated Vital Signs BP 125/81   Pulse 78   Temp 98.9 F (37.2 C)   Resp (!) 24   SpO2 93%  Physical Exam Vitals and nursing note reviewed.  Constitutional:      General: He is not in acute distress.    Appearance:  Normal appearance. He is not ill-appearing, toxic-appearing or diaphoretic.  HENT:     Head: Normocephalic and atraumatic.     Nose: Nose normal. No congestion.     Mouth/Throat:     Mouth: Mucous membranes are moist.     Pharynx: Oropharynx is clear.  Eyes:     Extraocular Movements: Extraocular movements intact.     Conjunctiva/sclera: Conjunctivae normal.     Pupils: Pupils are equal, round, and reactive to light.  Cardiovascular:     Rate and Rhythm: Normal rate and regular rhythm.  Pulmonary:     Effort: Pulmonary effort is normal.     Breath sounds: Normal breath sounds. No wheezing.  Abdominal:     General: Abdomen is flat. Bowel sounds are normal.     Palpations: Abdomen is soft.     Tenderness: There is no abdominal tenderness.  Musculoskeletal:     Cervical back: Normal range of motion and neck supple. No tenderness.  Skin:    General: Skin is warm and dry.     Capillary Refill: Capillary refill takes less than 2 seconds.  Neurological:     General: No focal deficit present.     Mental Status: He is alert and oriented to person, place, and time.     GCS: GCS eye subscore is 4. GCS verbal subscore is 5. GCS motor subscore is 6.     Cranial Nerves: Cranial nerves 2-12 are intact. No cranial nerve deficit.     Sensory: Sensation is intact. No sensory deficit.     Motor: No weakness.     ED Results / Procedures / Treatments   Labs (all labs ordered are listed, but only abnormal results are displayed) Labs Reviewed  COMPREHENSIVE METABOLIC PANEL - Abnormal; Notable for the following components:      Result Value   Potassium 2.2 (*)    Glucose, Bld 124 (*)    Creatinine, Ser 1.51 (*)    Calcium 8.7 (*)    GFR, Estimated 53 (*)    All other components within normal limits  SALICYLATE LEVEL - Abnormal; Notable for the following components:   Salicylate Lvl <3.8 (*)    All other components within normal limits  ACETAMINOPHEN LEVEL - Abnormal; Notable for the  following components:   Acetaminophen (Tylenol), Serum <10 (*)    All other components within normal limits  CBC - Abnormal; Notable for the following components:   WBC 12.7 (*)    Platelets 402 (*)    All other components within normal limits  RESP PANEL BY RT-PCR (FLU A&B, COVID) ARPGX2  ETHANOL  RAPID URINE DRUG SCREEN, HOSP PERFORMED  MAGNESIUM    EKG None  Radiology VAS US CAROTID  Result Date: 08/29/2021 Carotid Arterial Duplex Study Patient  Name:  Nunzio Cory  Date of Exam:   08/28/2021 Medical Rec #: [id number]           Accession #:    [id number] Date of Birth: [birth year]           Patient Gender: M Patient Age:   74 years Exam Location:  [facility] Procedure:      VAS US CAROTID Referring Phys: Minus Breeding --------------------------------------------------------------------------------  Indications:       Bilateral bruits and patient has a h/o ataxia and memory                    loss. He denies any other cerebrovascular symptoms. Risk Factors:      Hypertension, past history of smoking. Comparison Study:  NA Performing Technologist: Sharlett Iles RVT  Examination Guidelines: A complete evaluation includes B-mode imaging, spectral Doppler, color Doppler, and power Doppler as needed of all accessible portions of each vessel. Bilateral testing is considered an integral part of a complete examination. Limited examinations for reoccurring indications may be performed as noted.  Right Carotid Findings: +----------+--------+--------+--------+------------------+--------+           PSV cm/sEDV cm/sStenosisPlaque DescriptionComments +----------+--------+--------+--------+------------------+--------+ CCA Prox  74      19                                         +----------+--------+--------+--------+------------------+--------+ CCA Distal67      24                                         +----------+--------+--------+--------+------------------+--------+ ICA Prox  73       21      Normal                             +----------+--------+--------+--------+------------------+--------+ ICA Mid   53      22                                tortuous +----------+--------+--------+--------+------------------+--------+ ICA Distal59      28                                tortuous +----------+--------+--------+--------+------------------+--------+ ECA       99      17                                         +----------+--------+--------+--------+------------------+--------+ +----------+--------+-------+----------------+-------------------+           PSV cm/sEDV cmsDescribe        Arm Pressure (mmHG) +----------+--------+-------+----------------+-------------------+ [id number]            Multiphasic, [id number]                 +----------+--------+-------+----------------+-------------------+ +---------+--------+--+--------+--+---------+ VertebralPSV cm/s45EDV cm/s19Antegrade +---------+--------+--+--------+--+---------+  Left Carotid Findings: +----------+--------+--------+--------+------------------+----------+           PSV cm/sEDV cm/sStenosisPlaque DescriptionComments   +----------+--------+--------+--------+------------------+----------+ CCA Prox  89      18                                           +----------+--------+--------+--------+------------------+----------+  CCA Distal65      21                                           +----------+--------+--------+--------+------------------+----------+ ICA Prox  55      22      Normal                               +----------+--------+--------+--------+------------------+----------+ ICA Mid   75      34                                steep dive +----------+--------+--------+--------+------------------+----------+ ICA Distal67      31                                steep dive +----------+--------+--------+--------+------------------+----------+ ECA       57       17                                           +----------+--------+--------+--------+------------------+----------+ +----------+--------+--------+----------------+-------------------+           PSV cm/sEDV cm/sDescribe        Arm Pressure (mmHG) +----------+--------+--------+----------------+-------------------+ [id number]             Multiphasic, [id number]                 +----------+--------+--------+----------------+-------------------+ +---------+--------+--+--------+--+---------+ VertebralPSV cm/s50EDV cm/s16Antegrade +---------+--------+--+--------+--+---------+   Summary: Right Carotid: There is no evidence of stenosis in the right ICA. There was no                evidence of thrombus, dissection, atherosclerotic plaque or                stenosis in the cervical carotid system. Left Carotid: There is no evidence of stenosis in the left ICA. There was no               evidence of thrombus, dissection, atherosclerotic plaque or               stenosis in the cervical carotid system. Vertebrals:  Bilateral vertebral arteries demonstrate antegrade flow. Subclavians: Normal flow hemodynamics were seen in bilateral subclavian              arteries. *See table(s) above for measurements and observations.  Electronically signed by Carlyle Dolly MD on 08/29/2021 at 10:06:25 AM.    Final     Procedures Procedures   Medications Ordered in ED Medications  potassium chloride 10 mEq in 100 mL IVPB (10 mEq Intravenous New Bag/Given 08/29/21 1448)  potassium chloride SA (KLOR-CON M) CR tablet 40 mEq (has no administration in time range)  sodium chloride 0.9 % bolus 1,000 mL (has no administration in time range)    ED Course/ Medical Decision Making/ A&P                           Medical Decision Making Amount and/or Complexity of Data Reviewed Labs: ordered.  Risk Prescription drug management. Decision regarding hospitalization.   61 year old male presents to ED for  evaluation.  Please see HPI for further details.  On examination the patient is afebrile and nontachycardic.  Patient lung sounds are clear bilaterally, he is not hypoxic.  The patient abdomen is soft and compressible.  The patient neurological examination shows no focal neurodeficits.  The patient is nontoxic in appearance.  This patient revealed to me that he attempted to take his life the day prior by ingesting 30-40 Luvox tablets 100 mg each.  Due to this, I decided to IVC this patient due to the fact that he is a threat to himself.  The patient will be placed on a psych hold, TTS consult be placed.  Patient worked up utilizing the following labs and imaging studies interpreted by me personally: - Viral testing negative - Urine drug screen pending - Acetaminophen level negative - Ethanol level negative - CMP decreased potassium of 2.2, elevated creatinine 5.18 - Salicylate level negative - CBC leukocytosis of 12.7 - EKG nonischemic  Patient potassium level necessitates admission.  Patient started on 10 mg Of IV potassium x6, given 40 mEq of oral potassium.  Patient also started on 1 L fluid for creatinine bump.  Patient will be admitted at this time.  Dr. Francine Graven has agreed to admit the patient.  Final Clinical Impression(s) / ED Diagnoses Final diagnoses:  Suicide attempt [facility])  Involuntary commitment  Hypokalemia    Rx / DC Orders ED Discharge Orders     None          Lawana Chambers 08/29/21 [place]    Teressa Lower, MD 08/30/21 1034

## 2021-08-29 NOTE — Assessment & Plan Note (Signed)
We will hold all patient's antipsychotic medications for now until seen and evaluated by psychiatry.

## 2021-08-29 NOTE — Assessment & Plan Note (Signed)
Stable Continue Synthroid 

## 2021-08-29 NOTE — Consult Note (Cosign Needed Addendum)
[place] ED ASSESSMENT   Reason for Consult:  Suicide Attempt Referring Physician:  [facility] Patient Identification: Zachary [name] MRN:  [id number] ED Chief Complaint: Suicide attempt by drug overdose [facility])  Diagnosis:  Principal Problem:   Suicide attempt by drug overdose ([place]) Active Problems:   Major depression   OCD (obsessive compulsive disorder)   ED Assessment Time Calculation: Start Time: 1250 Stop Time: 1320 Total Time in Minutes (Assessment Completion): 30  If medically admitted, patient will need order for psychiatry consult.   Subjective:   Zachary [name] is a 61 y.o. male with a history of anxiety, depression, OCD, hoarding disorder, Crohn's disease, GERD, and IBS who presents to [facility] for evaluation of overdose.  Patient reports that day prior he attempted to overdose and take his life by ingesting 30-40 Luvox tablets 100 mg each.  Patient called his PCP this morning who instructed him to go to the ED.  HPI:   Patient reports that in retrospect he feels that he has been depressed for the past several weeks. He states that he has not been caring for his hygiene, not cleaning his house, and was not able to go grocery shopping, or do laundry, this past weekend as he usually does. Reports decreased appetite with 20 pound weight loss over several weeks. States that he has not eaten in the past 3 days. Reports that he has bene sleeping approximately 4-5 hours per night the past few weeks. He contribute this to watching youtube at night. Reports that he typically sleeps 9-10 hours per night. 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 He assumed that this person was a Electronics engineer. He states that he propositioned them with money. He states that he does not know for certain that this person is underage. He states that he has not been contacted by Event organiser. He states that he assumes this person is underage based on their  response to his proposition. He reports constant worry since this incident. He states that he has never propositioned someone with money before, which he is also concerned about why he would do that.   Patient states that he does not recall the names of his medications other than fluvoxamine. States that he has been on other psychotropics int he past but does not recall the names. He denies a previous history of suicide attempts and psychiatric hospitalization.   On evaluation patient is alert and oriented x 4, pleasant, and cooperative. Speech is clear and coherent. Mood is depressed/anxious and affect is congruent with mood. Thought process is coherent and thought content is obsessive.  Denies auditory and visual hallucinations. No indication that patient is responding to internal stimuli. Denies current suicidal ideations. Denies homicidal ideations. Denies substance abuse.    Past Psychiatric History: OCD, Hoarding Disorder, MDD. Denies previous psychiatric hospitalizations.  Risk to Self or Others: Is the patient at risk to self? Yes Has the patient been a risk to self in the past 6 months? No Has the patient been a risk to self within the distant past? No Is the patient a risk to others? No Has the patient been a risk to others in the past 6 months? No Has the patient been a risk to others within the distant past? No  [place] Scale:  [place] ED from 08/29/2021 in [place] DEPT ED from 07/21/2021 in [place] DEPT ED from 10/13/2020 in [facility] [place] High Risk No Risk Error: Question 6 not populated       AIMS:  , , ,  ,   ASAM:    Substance Abuse:  Alcohol / Drug Use Pain Medications: see MAR Prescriptions: See MAr Over the Counter: See MAR History of alcohol / drug use?: No history of alcohol / drug abuse  Past Medical History:  Past Medical History:  Diagnosis  Date   Anxiety    Crohn disease ([place])    pt reports subsequent testing was normal   Depression    GERD (gastroesophageal reflux disease)    Herpes simplex    Hoarding disorder    Hyperlipidemia    Hypertension    IBS (irritable bowel syndrome)    Incontinence     Past Surgical History:  Procedure Laterality Date   APPENDECTOMY     COLONOSCOPY     POLYPECTOMY     TWISTED TESTICLES  1970   [place]    UMBILICAL HERNIA REPAIR     Family History:  Family History  Problem Relation Age of Onset   Arthritis Mother    Stroke Mother    Depression Mother    Hypertension Father    Prostate cancer Father 50   Breast cancer Maternal Grandmother    Cancer Maternal Grandfather        unknown type   Cancer Paternal Grandmother        unknown type   Lung cancer Maternal Aunt    Prostate cancer Paternal Uncle    Prostate cancer Paternal Uncle    Colon cancer Neg Hx    Rectal cancer Neg Hx    Stomach cancer Neg Hx    Esophageal cancer Neg Hx    Pancreatic cancer Neg Hx    Social History:  Social History   Substance and Sexual Activity  Alcohol Use No     Social History   Substance and Sexual Activity  Drug Use No    Social History   Socioeconomic History   Marital status: Single    Spouse name: Not on file   Number of children: Not on file   Years of education: Not on file   Highest education level: Not on file  Occupational History   Not on file  Tobacco Use   Smoking status: Former    Types: Cigarettes    Quit date: 1980    Years since quitting: 43.6   Smokeless tobacco: Never  Vaping Use   Vaping Use: Never used  Substance and Sexual Activity   Alcohol use: No   Drug use: No   Sexual activity: Not Currently  Other Topics Concern   Not on file  Social History Narrative   Lives alone.     Social Determinants of Health   Financial Resource Strain: Not on file  Food Insecurity: Not on file  Transportation Needs: Not on file  Physical Activity: Not  on file  Stress: Not on file  Social Connections: Not on file   Additional Social History:    Allergies:   Allergies  Allergen Reactions   Erythromycin     Pt unsure, reports he recently received a -mycin drug with no reaction.    Penicillins     REACTION: "it may kill me"   Tetracyclines & Related Hives    Labs:  Results for orders placed or performed during the hospital encounter of 08/29/21 (from the past 48 hour(s))  Rapid urine drug screen (  hospital performed)     Status: None   Collection Time: 08/29/21 11:35 AM  Result Value Ref Range   Opiates NONE DETECTED NONE DETECTED   Cocaine NONE DETECTED NONE DETECTED   Benzodiazepines NONE DETECTED NONE DETECTED   Amphetamines NONE DETECTED NONE DETECTED   Tetrahydrocannabinol NONE DETECTED NONE DETECTED   Barbiturates NONE DETECTED NONE DETECTED    Comment: (NOTE) DRUG SCREEN FOR MEDICAL PURPOSES ONLY.  IF CONFIRMATION IS NEEDED FOR ANY PURPOSE, NOTIFY LAB WITHIN 5 DAYS.  LOWEST DETECTABLE LIMITS FOR URINE DRUG SCREEN Drug Class                     Cutoff (ng/mL) Amphetamine and metabolites    1000 Barbiturate and metabolites    200 Benzodiazepine                 175 Tricyclics and metabolites     300 Opiates and metabolites        300 Cocaine and metabolites        300 THC                            50 Performed at [facility], [place] [address], [place], [place] [address]   cbc     Status: Abnormal   Collection Time: 08/29/21  1:00 PM  Result Value Ref Range   WBC 12.7 (H) 4.0 - 10.5 K/uL   RBC 4.87 4.22 - 5.81 MIL/uL   Hemoglobin 15.3 13.0 - 17.0 g/dL   HCT 44.4 39.0 - 52.0 %   MCV 91.2 80.0 - 100.0 fL   MCH 31.4 26.0 - 34.0 pg   MCHC 34.5 30.0 - 36.0 g/dL   RDW 12.4 11.5 - 15.5 %   Platelets 402 (H) 150 - 400 K/uL   nRBC 0.0 0.0 - 0.2 %    Comment: Performed at [facility], [place] [address] [place], [place] [address]    No current facility-administered medications  for this encounter.   Current Outpatient Medications  Medication Sig Dispense Refill   aspirin 81 MG EC tablet Take 81 mg by mouth daily. Swallow whole.     budesonide (ENTOCORT EC) 3 MG 24 hr capsule TAKE 3 CAPSULES BY MOUTH EVERY DAY 90 capsule 5   busPIRone (BUSPAR) 30 MG tablet Take 1 tablet (30 mg total) by mouth 2 (two) times daily. 180 tablet 0   CREON 36000-114000 units CPEP capsule TAKE 4 CAPSULES BY MOUTH BEFORE A MEAL& 2 WITH EACH SNACK 1440 capsule 3   donepezil (ARICEPT) 10 MG tablet Take 1 tablet (10 mg total) by mouth at bedtime. 90 tablet 3   fluvoxaMINE (LUVOX) 100 MG tablet TAKE 1 TABLET BY MOUTH EVERY MORNING THEN TAKE 3 TABLETS BY MOUTH EVERY EVENING 120 tablet 5   lamoTRIgine (LAMICTAL) 200 MG tablet Take 1 tablet (200 mg total) by mouth daily. 90 tablet 0   levothyroxine (SYNTHROID) 75 MCG tablet TAKE 1 TABLET(75 MCG) BY MOUTH DAILY 90 tablet 0   lithium carbonate 150 MG capsule TAKE 1 CAPSULE(150 MG) BY MOUTH DAILY 30 capsule 1   loperamide (IMODIUM) 2 MG capsule Take 1 capsule (2 mg total) by mouth every 4 (four) hours. As needed 60 capsule 2   LORazepam (ATIVAN) 0.5 MG tablet 1-2 daily as needed for anxiety 30 tablet 0   losartan-hydrochlorothiazide (HYZAAR) 100-12.5 MG tablet Take 1 tablet by mouth daily.  mesalamine (APRISO) 0.375 g 24 hr capsule Take 4 capsules (1.5 g total) by mouth daily. 120 capsule 11   OLANZapine (ZYPREXA) 15 MG tablet Take 1 tablet (15 mg total) by mouth at bedtime. 90 tablet 0   sodium chloride 1 g tablet TAKE 1 TABLET(1 GRAM) BY MOUTH THREE TIMES DAILY 90 tablet 0    Musculoskeletal: Strength & Muscle Tone: within normal limits Gait & Station: normal Patient leans: N/A   Psychiatric Specialty Exam: Presentation  General Appearance: Appropriate for Environment; Fairly Groomed  Eye Contact:Good  Speech:Clear and Coherent; Normal Rate  Speech Volume:Normal  Handedness:No data recorded  Mood and Affect  Mood:Anxious;  Depressed  Affect:Congruent; Depressed   Thought Process  Thought Processes:Coherent; Linear  Descriptions of Associations:Intact  Orientation:Full (Time, Place and Person)  Thought Content:Obsessions  History of Schizophrenia/Schizoaffective disorder:No data recorded Duration of Psychotic Symptoms:No data recorded Hallucinations:Hallucinations: None  Ideas of Reference:None  Suicidal Thoughts:Suicidal Thoughts: No  Homicidal Thoughts:Homicidal Thoughts: No   Sensorium  Memory:Immediate Good; Recent Good; Remote Good  Judgment:Impaired  Insight:Lacking   Executive Functions  Concentration:Fair  Attention Span:Fair  [place] of Knowledge:Good  Language:Good   Psychomotor Activity  Psychomotor Activity:Psychomotor Activity: Normal   Assets  Assets:Communication Skills; Desire for Improvement; Housing; Social Support; Physical Health    Sleep  Sleep:Sleep: Fair   Physical Exam: Physical Exam Constitutional:      General: He is not in acute distress.    Appearance: He is not ill-appearing, toxic-appearing or diaphoretic.  HENT:     Right Ear: External ear normal.     Left Ear: External ear normal.  Eyes:     General:        Right eye: No discharge.        Left eye: No discharge.  Cardiovascular:     Rate and Rhythm: Normal rate.  Pulmonary:     Effort: Pulmonary effort is normal. No respiratory distress.  Musculoskeletal:        General: Normal range of motion.  Neurological:     Mental Status: He is alert and oriented to person, place, and time.  Psychiatric:        Thought Content: Thought content does not include homicidal or suicidal ideation.    Review of Systems  Constitutional:  Positive for malaise/fatigue and weight loss. Negative for chills, diaphoresis and fever.  Cardiovascular:  Negative for chest pain.  Gastrointestinal:  Positive for nausea. Negative for diarrhea and vomiting.  Neurological:  Negative for  dizziness and seizures.  Psychiatric/Behavioral:  Positive for depression and suicidal ideas. Negative for hallucinations, memory loss and substance abuse. The patient is nervous/anxious and has insomnia.    Blood pressure 125/81, pulse 78, temperature 98.9 F (37.2 C), resp. rate (!) 24, SpO2 93 %. There is no height or weight on file to calculate BMI.  Medical Decision Making: Patient attempted suicide by overdosing on fluvoxamine. Recommend inpatient treatment. Patient IVCs by EDP.   Review medications after medical clearance and review by pharmacy.   Problem 1: Suicide attempt  Problem 2: OCD  Problem 3: MDD  Disposition: Recommend psychiatric Inpatient admission when medically cleared.  If medically admitted, patient will need order for psychiatry consult.   Rozetta Nunnery, NP 08/29/2021 1:27 PM

## 2021-08-29 NOTE — Assessment & Plan Note (Signed)
Treatment as outlined in 1 

## 2021-08-29 NOTE — H&P (Signed)
History and Physical    Patient: Zachary [name] DQQ:[id number] DOB: [birth year] DOA: 08/29/2021 DOS: the patient was seen and examined on 08/29/2021 PCP: Denita Lung, MD  Patient coming from: Home  Chief Complaint:  Chief Complaint  Patient presents with   Suicide Attempt   HPI: Zachary [name] is a 61 y.o. male with medical history significant for Crohn's disease, anxiety, depression, hoarding disorder, IBS who presented to the ED at the request of his PCP for evaluation. Patient had gone to see his primary care provider where he admitted to have overdosed on 30-40 Luvox tablets, 100 mg each.  Patient admits to having suicidal ideations and states that he attempted to take his own life after he propositioned a person who he has been was a Electronics engineer at [employer] but now thinks that the person is under the age of 5.  He has not been contacted by law enforcement but has been watering constantly since this happened. IVC papers were taken out once patient arrived to the ER. Patient states that he has been depressed for several weeks and has not been caring about his hygiene or cleaning his house.  He has not had anything to eat or drink in 3 days and has not been sleeping like he should.  He had nausea which responded to antiemetics administered in the ER.  He has diarrhea which is chronic and related to his history of Crohn's disease and IBS. He denies having any chest pain, no shortness of breath, no vomiting, no abdominal pain, no leg swelling, no headache, no cough, no fever, no chills, no dizziness, no lightheadedness, no blurred vision no focal deficit. He denies having any auditory or visual hallucinations and denies having any active suicidal homicidal ideations.  He   Review of Systems: As mentioned in the history of present illness. All other systems reviewed and are negative. Past Medical History:  Diagnosis Date   Anxiety    Crohn disease ([place])    pt reports  subsequent testing was normal   Depression    GERD (gastroesophageal reflux disease)    Herpes simplex    Hoarding disorder    Hyperlipidemia    Hypertension    IBS (irritable bowel syndrome)    Incontinence    Past Surgical History:  Procedure Laterality Date   APPENDECTOMY     COLONOSCOPY     POLYPECTOMY     TWISTED TESTICLES  1970   [place]    UMBILICAL HERNIA REPAIR     Social History:  reports that he quit smoking about 43 years ago. His smoking use included cigarettes. He has never used smokeless tobacco. He reports that he does not drink alcohol and does not use drugs.  Allergies  Allergen Reactions   Erythromycin Other (See Comments)    Patient said he is "allergic," but the reaction was not cited   Penicillins Other (See Comments)    "It may kill me"   Tetracyclines & Related Hives    Family History  Problem Relation Age of Onset   Arthritis Mother    Stroke Mother    Depression Mother    Hypertension Father    Prostate cancer Father 68   Breast cancer Maternal Grandmother    Cancer Maternal Grandfather        unknown type   Cancer Paternal Grandmother        unknown type   Lung cancer Maternal Aunt    Prostate cancer Paternal Uncle  Prostate cancer Paternal Uncle    Colon cancer Neg Hx    Rectal cancer Neg Hx    Stomach cancer Neg Hx    Esophageal cancer Neg Hx    Pancreatic cancer Neg Hx     Prior to Admission medications   Medication Sig Start Date End Date Taking? Authorizing Provider  aspirin 81 MG EC tablet Take 81 mg by mouth daily. Swallow whole.   Yes [provider]  budesonide (ENTOCORT EC) 3 MG 24 hr capsule TAKE 3 CAPSULES BY MOUTH EVERY DAY Patient taking differently: Take 9 mg by mouth daily. 04/11/21  Yes Ladene Artist, MD  busPIRone (BUSPAR) 30 MG tablet Take 1 tablet (30 mg total) by mouth 2 (two) times daily. 07/13/21  Yes Cottle, Billey Co., MD  fluvoxaMINE (LUVOX) 100 MG tablet TAKE 1 TABLET BY MOUTH EVERY MORNING  THEN TAKE 3 TABLETS BY MOUTH EVERY EVENING Patient taking differently: Take 100-300 mg by mouth See admin instructions. Take 100 mg by mouth in the morning and 300 mg in the evening 07/13/21  Yes Cottle, Billey Co., MD  lamoTRIgine (LAMICTAL) 200 MG tablet Take 1 tablet (200 mg total) by mouth daily. 07/13/21  Yes Cottle, Billey Co., MD  lithium carbonate 150 MG capsule TAKE 1 CAPSULE(150 MG) BY MOUTH DAILY Patient taking differently: Take 150 mg by mouth daily. 07/20/21  Yes Cottle, Billey Co., MD  mesalamine (APRISO) 0.375 g 24 hr capsule Take 4 capsules (1.5 g total) by mouth daily. 01/16/21 08/29/21 Yes Ladene Artist, MD  sodium chloride 1 g tablet TAKE 1 TABLET(1 GRAM) BY MOUTH THREE TIMES DAILY Patient taking differently: Take 1 g by mouth 3 (three) times daily. 04/17/21  Yes Cottle, Billey Co., MD  CREON [phone] units CPEP capsule TAKE 4 CAPSULES BY MOUTH BEFORE A MEAL& 2 WITH [facility] SNACK 11/14/20   Zehr, Janett Billow D, PA-C  donepezil (ARICEPT) 10 MG tablet Take 1 tablet (10 mg total) by mouth at bedtime. 07/13/21   Cottle, Billey Co., MD  levothyroxine (SYNTHROID) 75 MCG tablet TAKE 1 TABLET(75 MCG) BY MOUTH DAILY 07/31/21   Denita Lung, MD  loperamide (IMODIUM) 2 MG capsule Take 1 capsule (2 mg total) by mouth every 4 (four) hours. As needed 07/17/21   Levin Erp, PA  LORazepam (ATIVAN) 0.5 MG tablet 1-2 daily as needed for anxiety 07/13/21   Cottle, Billey Co., MD  losartan-hydrochlorothiazide (HYZAAR) 100-12.5 MG tablet Take 1 tablet by mouth daily. 03/31/21   [provider]  OLANZapine (ZYPREXA) 15 MG tablet Take 1 tablet (15 mg total) by mouth at bedtime. 07/13/21   Cottle, Billey Co., MD    Physical Exam: Vitals:   08/29/21 1141 08/29/21 1600  BP: 125/81 125/83  Pulse: 78 67  Resp: (!) 24 (!) 22  Temp: 98.9 F (37.2 C)   SpO2: 93% 100%   Physical Exam Vitals and nursing note reviewed.  Constitutional:      Appearance: Normal appearance.  HENT:      Head: Normocephalic and atraumatic.     Nose: Nose normal.     Mouth/Throat:     Mouth: Mucous membranes are dry.  Eyes:     Conjunctiva/sclera: Conjunctivae normal.  Cardiovascular:     Rate and Rhythm: Normal rate and regular rhythm.  Pulmonary:     Effort: Pulmonary effort is normal.     Breath sounds: Normal breath sounds.  Abdominal:     General: Abdomen  is flat. Bowel sounds are normal.     Palpations: Abdomen is soft.  Musculoskeletal:        General: Normal range of motion.     Cervical back: Normal range of motion and neck supple.  Skin:    General: Skin is warm and dry.  Neurological:     General: No focal deficit present.     Mental Status: He is alert.  Psychiatric:     Comments: Depressed mood, flat affect     Data Reviewed: Relevant notes from primary care and specialist visits, past discharge summaries as available in EHR, including Care Everywhere. Prior diagnostic testing as pertinent to current admission diagnoses Updated medications and problem lists for reconciliation ED course, including vitals, labs, imaging, treatment and response to treatment Triage notes, nursing and pharmacy notes and ED provider's notes Notable results as noted in HPI Labs reviewed.  Magnesium 2.1, sodium 135, potassium 2.2, chloride 100, bicarb 22, glucose 124, BUN 15, creatinine 1.51, calcium 8.7, total protein 7.0, albumin 4.0, AST 32, ALT 34, alkaline phosphatase 64, total bilirubin 0.8, alcohol level less than 10, salicylate level less than 7, white count 12.7, hemoglobin 15.3, hematocrit 44, MCV 91.2, platelet count 4 2 Urine drug screen is negative Respiratory viral panel is negative Twelve-lead EKG reviewed by me shows sinus rhythm with incomplete right bundle branch block and nonspecific ST changes in the anterior lateral leads. There are no new results to review at this time.  Assessment and Plan: * Suicide attempt by drug overdose [facility]) Patient presents to the ER for  evaluation after an intentional overdose. He admits to taking about 30 to 40 tablets of Luvox 100 mg Discussed with poison control who states that its been more than 24 hours since ingestion and nothing to monitor at this time Patient is currently involuntarily committed Place patient on one-to-one suicide precautions Discussed with psychiatrist, Dr Cloyde Reams and patient will be seen in consultation Continue supportive care  Major depression We will hold all patient's antipsychotic medications for now until seen and evaluated by psychiatry.  Hypokalemia Secondary to GI losses from diarrhea as well as from HCTZ use. Supplement potassium Check magnesium levels  Intentional overdose (HCC) Treatment as outlined in 1  Hypothyroidism Stable Continue Synthroid  Pancreatic insufficiency Stable Continue Creon  Essential hypertension Blood pressure is stable Continue HCTZ/losartan  OCD (obsessive compulsive disorder) Stable  History of Crohn's disease Stable Continue budesonide and mesalamine as maintenance therapy      Advance Care Planning:   Code Status: Full Code   Consults: Behavioral health  Family Communication: Greater than 50% of time was spent discussing patient's condition and plan of care with him at the bedside.  All questions and concerns have been addressed.  He verbalizes understanding and agrees with the plan.  Severity of Illness: The appropriate patient status for this patient is INPATIENT. Inpatient status is judged to be reasonable and necessary in order to provide the required intensity of service to ensure the patient's safety. The patient's presenting symptoms, physical exam findings, and initial radiographic and laboratory data in the context of their chronic comorbidities is felt to place them at high risk for further clinical deterioration. Furthermore, it is not anticipated that the patient will be medically stable for discharge from the hospital within 2  midnights of admission.   * I certify that at the point of admission it is my clinical judgment that the patient will require inpatient hospital care spanning beyond 2 midnights from the  point of admission due to high intensity of service, high risk for further deterioration and high frequency of surveillance required.*  Author: Collier Bullock, MD 08/29/2021 4:27 PM  For on call review [email or web address].

## 2021-08-30 ENCOUNTER — Encounter: Payer: Self-pay | Admitting: *Deleted

## 2021-08-30 DIAGNOSIS — E876 Hypokalemia: Secondary | ICD-10-CM | POA: Diagnosis present

## 2021-08-30 DIAGNOSIS — T43222A Poisoning by selective serotonin reuptake inhibitors, intentional self-harm, initial encounter: Secondary | ICD-10-CM | POA: Diagnosis present

## 2021-08-30 DIAGNOSIS — E785 Hyperlipidemia, unspecified: Secondary | ICD-10-CM | POA: Diagnosis present

## 2021-08-30 DIAGNOSIS — F423 Hoarding disorder: Secondary | ICD-10-CM | POA: Diagnosis present

## 2021-08-30 DIAGNOSIS — T50902A Poisoning by unspecified drugs, medicaments and biological substances, intentional self-harm, initial encounter: Secondary | ICD-10-CM | POA: Diagnosis not present

## 2021-08-30 DIAGNOSIS — Z880 Allergy status to penicillin: Secondary | ICD-10-CM | POA: Diagnosis not present

## 2021-08-30 DIAGNOSIS — Z20822 Contact with and (suspected) exposure to covid-19: Secondary | ICD-10-CM | POA: Diagnosis present

## 2021-08-30 DIAGNOSIS — I10 Essential (primary) hypertension: Secondary | ICD-10-CM

## 2021-08-30 DIAGNOSIS — R45851 Suicidal ideations: Secondary | ICD-10-CM | POA: Diagnosis present

## 2021-08-30 DIAGNOSIS — Z79890 Hormone replacement therapy (postmenopausal): Secondary | ICD-10-CM | POA: Diagnosis not present

## 2021-08-30 DIAGNOSIS — Z9189 Other specified personal risk factors, not elsewhere classified: Secondary | ICD-10-CM | POA: Diagnosis present

## 2021-08-30 DIAGNOSIS — Y929 Unspecified place or not applicable: Secondary | ICD-10-CM | POA: Diagnosis not present

## 2021-08-30 DIAGNOSIS — Z79899 Other long term (current) drug therapy: Secondary | ICD-10-CM | POA: Diagnosis not present

## 2021-08-30 DIAGNOSIS — T50901A Poisoning by unspecified drugs, medicaments and biological substances, accidental (unintentional), initial encounter: Secondary | ICD-10-CM | POA: Diagnosis present

## 2021-08-30 DIAGNOSIS — Z87891 Personal history of nicotine dependence: Secondary | ICD-10-CM | POA: Diagnosis not present

## 2021-08-30 DIAGNOSIS — Z8249 Family history of ischemic heart disease and other diseases of the circulatory system: Secondary | ICD-10-CM | POA: Diagnosis not present

## 2021-08-30 DIAGNOSIS — Z823 Family history of stroke: Secondary | ICD-10-CM | POA: Diagnosis not present

## 2021-08-30 DIAGNOSIS — K219 Gastro-esophageal reflux disease without esophagitis: Secondary | ICD-10-CM | POA: Diagnosis present

## 2021-08-30 DIAGNOSIS — K8689 Other specified diseases of pancreas: Secondary | ICD-10-CM | POA: Diagnosis present

## 2021-08-30 DIAGNOSIS — F419 Anxiety disorder, unspecified: Secondary | ICD-10-CM | POA: Diagnosis present

## 2021-08-30 DIAGNOSIS — K5090 Crohn's disease, unspecified, without complications: Secondary | ICD-10-CM | POA: Diagnosis present

## 2021-08-30 DIAGNOSIS — Z818 Family history of other mental and behavioral disorders: Secondary | ICD-10-CM | POA: Diagnosis not present

## 2021-08-30 DIAGNOSIS — E039 Hypothyroidism, unspecified: Secondary | ICD-10-CM | POA: Diagnosis present

## 2021-08-30 DIAGNOSIS — Z888 Allergy status to other drugs, medicaments and biological substances status: Secondary | ICD-10-CM | POA: Diagnosis not present

## 2021-08-30 DIAGNOSIS — Z8261 Family history of arthritis: Secondary | ICD-10-CM | POA: Diagnosis not present

## 2021-08-30 DIAGNOSIS — F329 Major depressive disorder, single episode, unspecified: Secondary | ICD-10-CM | POA: Diagnosis present

## 2021-08-30 DIAGNOSIS — N182 Chronic kidney disease, stage 2 (mild): Secondary | ICD-10-CM | POA: Diagnosis present

## 2021-08-30 DIAGNOSIS — I129 Hypertensive chronic kidney disease with stage 1 through stage 4 chronic kidney disease, or unspecified chronic kidney disease: Secondary | ICD-10-CM | POA: Diagnosis present

## 2021-08-30 DIAGNOSIS — K580 Irritable bowel syndrome with diarrhea: Secondary | ICD-10-CM | POA: Diagnosis present

## 2021-08-30 LAB — CBC
HCT: 38.7 % — ABNORMAL LOW (ref 39.0–52.0)
Hemoglobin: 13.4 g/dL (ref 13.0–17.0)
MCH: 31.9 pg (ref 26.0–34.0)
MCHC: 34.6 g/dL (ref 30.0–36.0)
MCV: 92.1 fL (ref 80.0–100.0)
Platelets: 331 10*3/uL (ref 150–400)
RBC: 4.2 MIL/uL — ABNORMAL LOW (ref 4.22–5.81)
RDW: 12.4 % (ref 11.5–15.5)
WBC: 11.1 10*3/uL — ABNORMAL HIGH (ref 4.0–10.5)
nRBC: 0 % (ref 0.0–0.2)

## 2021-08-30 LAB — BASIC METABOLIC PANEL
Anion gap: 5 (ref 5–15)
BUN: 10 mg/dL (ref 6–20)
CO2: 24 mmol/L (ref 22–32)
Calcium: 8.1 mg/dL — ABNORMAL LOW (ref 8.9–10.3)
Chloride: 107 mmol/L (ref 98–111)
Creatinine, Ser: 0.81 mg/dL (ref 0.61–1.24)
GFR, Estimated: >60 mL/min (ref >60)
Glucose, Bld: 101 mg/dL — ABNORMAL HIGH (ref 70–99)
Potassium: 2.9 mmol/L — ABNORMAL LOW (ref 3.5–5.1)
Sodium: 136 mmol/L (ref 135–145)

## 2021-08-30 LAB — HIV ANTIBODY (ROUTINE TESTING W REFLEX): HIV Screen 4th Generation wRfx: NONREACTIVE

## 2021-08-30 LAB — POTASSIUM: Potassium: 3.5 mmol/L (ref 3.5–5.1)

## 2021-08-30 LAB — LITHIUM LEVEL: Lithium Lvl: <0.06 mmol/L — ABNORMAL LOW (ref 0.60–1.20)

## 2021-08-30 MED ORDER — POTASSIUM CHLORIDE CRYS ER 20 MEQ PO TBCR
40.0000 meq | EXTENDED_RELEASE_TABLET | ORAL | Status: AC
  Administered 2021-08-30 (×2): 40 meq via ORAL
  Filled 2021-08-30 (×2): qty 2

## 2021-08-30 MED ORDER — SODIUM CHLORIDE 0.9 % IV SOLN
12.5000 mg | Freq: Once | INTRAVENOUS | Status: AC
  Administered 2021-08-30: 12.5 mg via INTRAVENOUS
  Filled 2021-08-30: qty 12.5

## 2021-08-30 MED ORDER — PANTOPRAZOLE SODIUM 40 MG PO TBEC
40.0000 mg | DELAYED_RELEASE_TABLET | Freq: Every day | ORAL | Status: DC
  Administered 2021-08-30 – 2021-08-31 (×2): 40 mg via ORAL
  Filled 2021-08-30: qty 1

## 2021-08-30 MED ORDER — OLANZAPINE 5 MG PO TABS
15.0000 mg | ORAL_TABLET | Freq: Every day | ORAL | Status: DC
  Administered 2021-08-30: 15 mg via ORAL
  Filled 2021-08-30 (×2): qty 1

## 2021-08-30 MED ORDER — BUSPIRONE HCL 10 MG PO TABS
30.0000 mg | ORAL_TABLET | Freq: Two times a day (BID) | ORAL | Status: DC
  Administered 2021-08-30 – 2021-08-31 (×3): 30 mg via ORAL
  Filled 2021-08-30 (×3): qty 3

## 2021-08-30 MED ORDER — POTASSIUM CHLORIDE CRYS ER 20 MEQ PO TBCR
40.0000 meq | EXTENDED_RELEASE_TABLET | Freq: Once | ORAL | Status: AC
  Administered 2021-08-30: 40 meq via ORAL
  Filled 2021-08-30: qty 2

## 2021-08-30 NOTE — Progress Notes (Signed)
[facility] PROGRESS NOTE   Zachary [name] OMV:[id number] DOB: Jan 16, 1960 DOA: 08/29/2021  PCP: Denita Lung, MD  Brief History/Interval Summary: 61 y.o. male with medical history significant for Crohn's disease, anxiety, depression, hoarding disorder, IBS who presented to the ED at the request of his PCP for evaluation. Patient had gone to see his primary care provider where he admitted to have overdosed on 30-40 Luvox tablets, 100 mg each.  Patient admitted to having suicidal ideations. IVC papers were taken out once patient arrived to the ER.  Consultants: Psychiatry  Procedures: None yet    Subjective/Interval History: Patient complains of nausea this morning.  Denies any abdominal pain.  No chest pain or shortness of breath.    Assessment/Plan:  Intentional drug overdose/suicidal attempt Patient admitted to taking 30 to 40 tablets of Luvox 100 mg each.  This was discussed with poison control.  Patient does not appear to have any long-term effects from taking these medications. Seen by psychiatry.  Waiting on medical clearance before patient can be sent over to behavioral health.  Hypokalemia Likely due to GI loss.  He does have a history of Crohn's disease and IBS.  Has diarrhea which is chronic for the most part. Potassium to be aggressively repleted.  Magnesium was 2.1 yesterday.  Potassium has improved from 2.2-2.9.  We will recheck later today and tomorrow.  Nausea Give antiemetics.  Abdomen is benign on examination.  LFTs were normal.  Major depression Discussed with psychiatry.  Have resumed Lamictal, olanzapine and BuSpar.  Lithium on hold.  Hypothyroidism Continue levothyroxine.  Essential hypertension Continue ARB.  Hold HCTZ since potassium remains low.  Pancreatic insufficiency Continue Creon.  History of Crohn's disease Continue budesonide and mesalamine as maintenance treatment.   DVT Prophylaxis: Lovenox Code Status: Full  code Family Communication: Discussed with the patient Disposition Plan: Will need to go to behavioral health after he is medically stabilized  Status is: Observation The patient will require care spanning > 2 midnights and should be moved to inpatient because: Intentional overdose, suicidal attempt, hypokalemia    Medications: Scheduled:  aspirin EC  81 mg Oral Daily   budesonide  9 mg Oral Daily   busPIRone  30 mg Oral BID   enoxaparin (LOVENOX) injection  40 mg Subcutaneous Q24H   lamoTRIgine  200 mg Oral Daily   levothyroxine  75 mcg Oral Q0600   lipase/protease/amylase  36,000 Units Oral TID AC   losartan  100 mg Oral Daily   mesalamine  1,500 mg Oral Daily   OLANZapine  15 mg Oral QHS   pantoprazole  40 mg Oral Q1200   potassium chloride  40 mEq Oral Q4H   sodium chloride flush  3 mL Intravenous Q12H   Continuous:  sodium chloride     sodium chloride 100 mL/hr at 08/29/21 1704   GGE:ZMOQHU chloride, acetaminophen **OR** acetaminophen, loperamide, LORazepam, ondansetron **OR** ondansetron (ZOFRAN) IV, sodium chloride flush  Antibiotics: Anti-infectives (From admission, onward)    None       Objective:  Vital Signs  Vitals:   08/29/21 1756 08/29/21 2107 08/30/21 0100 08/30/21 0615  BP:  (!) 149/90 (!) 159/87 (!) 162/98  Pulse:  64 68 65  Resp:  17 17 17   Temp:  98.2 F (36.8 C) 97.9 F (36.6 C) 97.7 F (36.5 C)  TempSrc:  Oral Oral Oral  SpO2:  98% 98% 97%  Weight: 108.4 kg     Height: 6' 3"  (1.905 m)  Intake/Output Summary (Last 24 hours) at 08/30/2021 1153 Last data filed at 08/30/2021 0930 Gross per 24 hour  Intake 1994.7 ml  Output --  Net 1994.7 ml   Filed Weights   08/29/21 1756  Weight: 108.4 kg    General appearance: Awake alert.  In no distress Resp: Clear to auscultation bilaterally.  Normal effort Cardio: S1-S2 is normal regular.  No S3-S4.  No rubs murmurs or bruit GI: Abdomen is soft.  Nontender nondistended.  Bowel sounds are  present normal.  No masses organomegaly Extremities: No edema.  Full range of motion of lower extremities. Neurologic: Alert and oriented x3.  No focal neurological deficits.    Lab Results:  Data Reviewed: I have personally reviewed following labs and reports of the imaging studies  CBC: Recent Labs  Lab 08/29/21 1300 08/30/21 0532  WBC 12.7* 11.1*  HGB 15.3 13.4  HCT 44.4 38.7*  MCV 91.2 92.1  PLT 402* 539    Basic Metabolic Panel: Recent Labs  Lab 08/25/21 1518 08/29/21 1300 08/30/21 0532  NA 139 135 136  K 3.5 2.2* 2.9*  CL 97 100 107  CO2 33* 22 24  GLUCOSE 131* 124* 101*  BUN 14 15 10   CREATININE 1.36 1.51* 0.81  CALCIUM 9.2 8.7* 8.1*  MG  --  2.1  --     GFR: Estimated Creatinine Clearance: 130.7 mL/min (by C-G formula based on SCr of 0.81 mg/dL).  Liver Function Tests: Recent Labs  Lab 08/29/21 1300  AST 32  ALT 34  ALKPHOS 64  BILITOT 0.8  PROT 7.0  ALBUMIN 4.0     Recent Results (from the past 240 hour(s))  Resp Panel by RT-PCR (Flu A&B, Covid) Urine, Clean Catch     Status: None   Collection Time: 08/29/21 12:33 PM   Specimen: Urine, Clean Catch; Nasal Swab  Result Value Ref Range Status   SARS Coronavirus 2 by RT PCR NEGATIVE NEGATIVE Final    Comment: (NOTE) SARS-CoV-2 target nucleic acids are NOT DETECTED.  The SARS-CoV-2 RNA is generally detectable in upper respiratory specimens during the acute phase of infection. The lowest concentration of SARS-CoV-2 viral copies this assay can detect is 138 copies/mL. A negative result does not preclude SARS-Cov-2 infection and should not be used as the sole basis for treatment or other patient management decisions. A negative result may occur with  improper specimen collection/handling, submission of specimen other than nasopharyngeal swab, presence of viral mutation(s) within the areas targeted by this assay, and inadequate number of viral copies(<138 copies/mL). A negative result must be  combined with clinical observations, patient history, and epidemiological information. The expected result is Negative.  Fact Sheet for Patients:  EntrepreneurPulse.com.au  Fact Sheet for Healthcare Providers:  IncredibleEmployment.be  This test is no t yet approved or cleared by the [place] FDA and  has been authorized for detection and/or diagnosis of SARS-CoV-2 by FDA under an Emergency Use Authorization (EUA). This EUA will remain  in effect (meaning this test can be used) for the duration of the COVID-19 declaration under Section 564(b)(1) of the Act, 21 U.S.C.section 360bbb-3(b)(1), unless the authorization is terminated  or revoked sooner.       Influenza A by PCR NEGATIVE NEGATIVE Final   Influenza B by PCR NEGATIVE NEGATIVE Final    Comment: (NOTE) The Xpert Xpress SARS-CoV-2/FLU/RSV plus assay is intended as an aid in the diagnosis of influenza from Nasopharyngeal swab specimens and should not be used as a sole basis for  treatment. Nasal washings and aspirates are unacceptable for Xpert Xpress SARS-CoV-2/FLU/RSV testing.  Fact Sheet for Patients: EntrepreneurPulse.com.au  Fact Sheet for Healthcare Providers: IncredibleEmployment.be  This test is not yet approved or cleared by the [place] FDA and has been authorized for detection and/or diagnosis of SARS-CoV-2 by FDA under an Emergency Use Authorization (EUA). This EUA will remain in effect (meaning this test can be used) for the duration of the COVID-19 declaration under Section 564(b)(1) of the Act, 21 U.S.C. section 360bbb-3(b)(1), unless the authorization is terminated or revoked.  Performed at [facility] [place] [address] [place], [place] [address]       Radiology Studies: No results found.     LOS: 0 days   Zachary [name] [employer] on [email or web address]  08/30/2021, 11:53 AM

## 2021-08-30 NOTE — Progress Notes (Signed)
Patients personal Home meds verified and sent to pharmacy

## 2021-08-31 ENCOUNTER — Inpatient Hospital Stay (HOSPITAL_COMMUNITY)
Admission: AD | Admit: 2021-08-31 | Discharge: 2021-09-06 | DRG: 885 | Disposition: A | Discharge status: Home / Self Care | Payer: 59 | Source: Intra-hospital | Attending: Emergency Medicine | Admitting: Emergency Medicine

## 2021-08-31 ENCOUNTER — Other Ambulatory Visit: Payer: Self-pay

## 2021-08-31 ENCOUNTER — Encounter (HOSPITAL_COMMUNITY): Payer: Self-pay | Admitting: Family

## 2021-08-31 DIAGNOSIS — R002 Palpitations: Secondary | ICD-10-CM | POA: Diagnosis present

## 2021-08-31 DIAGNOSIS — K5090 Crohn's disease, unspecified, without complications: Secondary | ICD-10-CM | POA: Diagnosis present

## 2021-08-31 DIAGNOSIS — F422 Mixed obsessional thoughts and acts: Secondary | ICD-10-CM | POA: Diagnosis present

## 2021-08-31 DIAGNOSIS — Z818 Family history of other mental and behavioral disorders: Secondary | ICD-10-CM

## 2021-08-31 DIAGNOSIS — Z79899 Other long term (current) drug therapy: Secondary | ICD-10-CM | POA: Diagnosis not present

## 2021-08-31 DIAGNOSIS — E039 Hypothyroidism, unspecified: Secondary | ICD-10-CM | POA: Diagnosis present

## 2021-08-31 DIAGNOSIS — Z880 Allergy status to penicillin: Secondary | ICD-10-CM | POA: Diagnosis not present

## 2021-08-31 DIAGNOSIS — F4010 Social phobia, unspecified: Secondary | ICD-10-CM | POA: Diagnosis present

## 2021-08-31 DIAGNOSIS — F333 Major depressive disorder, recurrent, severe with psychotic symptoms: Secondary | ICD-10-CM | POA: Diagnosis present

## 2021-08-31 DIAGNOSIS — K8681 Exocrine pancreatic insufficiency: Secondary | ICD-10-CM | POA: Diagnosis present

## 2021-08-31 DIAGNOSIS — Z881 Allergy status to other antibiotic agents status: Secondary | ICD-10-CM

## 2021-08-31 DIAGNOSIS — Z87891 Personal history of nicotine dependence: Secondary | ICD-10-CM | POA: Diagnosis not present

## 2021-08-31 DIAGNOSIS — Z8249 Family history of ischemic heart disease and other diseases of the circulatory system: Secondary | ICD-10-CM

## 2021-08-31 DIAGNOSIS — K8689 Other specified diseases of pancreas: Secondary | ICD-10-CM | POA: Diagnosis present

## 2021-08-31 DIAGNOSIS — R319 Hematuria, unspecified: Secondary | ICD-10-CM | POA: Diagnosis present

## 2021-08-31 DIAGNOSIS — T43222A Poisoning by selective serotonin reuptake inhibitors, intentional self-harm, initial encounter: Secondary | ICD-10-CM | POA: Diagnosis present

## 2021-08-31 DIAGNOSIS — E876 Hypokalemia: Secondary | ICD-10-CM | POA: Diagnosis present

## 2021-08-31 DIAGNOSIS — I10 Essential (primary) hypertension: Secondary | ICD-10-CM | POA: Diagnosis present

## 2021-08-31 DIAGNOSIS — F17210 Nicotine dependence, cigarettes, uncomplicated: Secondary | ICD-10-CM | POA: Diagnosis present

## 2021-08-31 DIAGNOSIS — Z888 Allergy status to other drugs, medicaments and biological substances status: Secondary | ICD-10-CM | POA: Diagnosis not present

## 2021-08-31 DIAGNOSIS — F0392 Unspecified dementia, unspecified severity, with psychotic disturbance: Secondary | ICD-10-CM | POA: Diagnosis present

## 2021-08-31 DIAGNOSIS — E785 Hyperlipidemia, unspecified: Secondary | ICD-10-CM | POA: Diagnosis present

## 2021-08-31 DIAGNOSIS — F423 Hoarding disorder: Secondary | ICD-10-CM | POA: Diagnosis present

## 2021-08-31 DIAGNOSIS — T50902A Poisoning by unspecified drugs, medicaments and biological substances, intentional self-harm, initial encounter: Secondary | ICD-10-CM | POA: Diagnosis present

## 2021-08-31 DIAGNOSIS — K219 Gastro-esophageal reflux disease without esophagitis: Secondary | ICD-10-CM | POA: Diagnosis present

## 2021-08-31 DIAGNOSIS — F332 Major depressive disorder, recurrent severe without psychotic features: Secondary | ICD-10-CM | POA: Diagnosis present

## 2021-08-31 DIAGNOSIS — F0393 Unspecified dementia, unspecified severity, with mood disturbance: Secondary | ICD-10-CM | POA: Diagnosis present

## 2021-08-31 DIAGNOSIS — Z79890 Hormone replacement therapy (postmenopausal): Secondary | ICD-10-CM

## 2021-08-31 DIAGNOSIS — R413 Other amnesia: Secondary | ICD-10-CM | POA: Diagnosis present

## 2021-08-31 DIAGNOSIS — Z7982 Long term (current) use of aspirin: Secondary | ICD-10-CM

## 2021-08-31 DIAGNOSIS — Z8719 Personal history of other diseases of the digestive system: Secondary | ICD-10-CM

## 2021-08-31 LAB — BASIC METABOLIC PANEL
Anion gap: 5 (ref 5–15)
BUN: 8 mg/dL (ref 6–20)
CO2: 28 mmol/L (ref 22–32)
Calcium: 8.4 mg/dL — ABNORMAL LOW (ref 8.9–10.3)
Chloride: 108 mmol/L (ref 98–111)
Creatinine, Ser: 1.46 mg/dL — ABNORMAL HIGH (ref 0.61–1.24)
GFR, Estimated: 55 mL/min — ABNORMAL LOW (ref >60)
Glucose, Bld: 97 mg/dL (ref 70–99)
Potassium: 3.6 mmol/L (ref 3.5–5.1)
Sodium: 141 mmol/L (ref 135–145)

## 2021-08-31 LAB — SARS CORONAVIRUS 2 BY RT PCR: SARS Coronavirus 2 by RT PCR: NEGATIVE

## 2021-08-31 LAB — LAMOTRIGINE LEVEL: Lamotrigine Lvl: 2.2 ug/mL (ref 2.0–20.0)

## 2021-08-31 LAB — MAGNESIUM: Magnesium: 2.2 mg/dL (ref 1.7–2.4)

## 2021-08-31 MED ORDER — ACETAMINOPHEN 650 MG RE SUPP: 650.0000 mg | Freq: Four times a day (QID) | RECTAL | Status: DC | PRN

## 2021-08-31 MED ORDER — ALUM & MAG HYDROXIDE-SIMETH 200-200-20 MG/5ML PO SUSP: 30.0000 mL | ORAL | Status: DC | PRN

## 2021-08-31 MED ORDER — LOPERAMIDE HCL 2 MG PO CAPS
2.0000 mg | ORAL_CAPSULE | ORAL | Status: DC | PRN
Start: 2021-08-31 — End: 2021-09-06

## 2021-08-31 MED ORDER — PANCRELIPASE (LIP-PROT-AMYL) 36000-114000 UNITS PO CPEP
36000.0000 [IU] | ORAL_CAPSULE | Freq: Three times a day (TID) | ORAL | Status: DC
  Administered 2021-08-31: 36000 [IU] via ORAL
  Filled 2021-08-31 (×4): qty 1

## 2021-08-31 MED ORDER — POTASSIUM CHLORIDE CRYS ER 20 MEQ PO TBCR
40.0000 meq | EXTENDED_RELEASE_TABLET | Freq: Once | ORAL | Status: AC
Start: 2021-08-31 — End: 2021-08-31
  Administered 2021-08-31: 40 meq via ORAL
  Filled 2021-08-31: qty 2

## 2021-08-31 MED ORDER — PANTOPRAZOLE SODIUM 40 MG PO TBEC: 40.0000 mg | DELAYED_RELEASE_TABLET | Freq: Every day | ORAL | Status: DC

## 2021-08-31 MED ORDER — ACETAMINOPHEN 325 MG PO TABS: 650.0000 mg | ORAL_TABLET | Freq: Four times a day (QID) | ORAL | Status: DC | PRN

## 2021-08-31 MED ORDER — MAGNESIUM HYDROXIDE 400 MG/5ML PO SUSP: 30.0000 mL | Freq: Every day | ORAL | Status: DC | PRN

## 2021-08-31 MED ORDER — LAMOTRIGINE 200 MG PO TABS
200.0000 mg | ORAL_TABLET | Freq: Every day | ORAL | Status: DC
  Administered 2021-09-01 – 2021-09-06 (×6): 200 mg via ORAL
  Filled 2021-08-31 (×9): qty 1

## 2021-08-31 MED ORDER — BUSPIRONE HCL 15 MG PO TABS
30.0000 mg | ORAL_TABLET | Freq: Two times a day (BID) | ORAL | Status: DC
  Administered 2021-08-31 – 2021-09-06 (×12): 30 mg via ORAL
  Filled 2021-08-31 (×19): qty 2

## 2021-08-31 MED ORDER — LOSARTAN POTASSIUM 100 MG PO TABS
100.0000 mg | ORAL_TABLET | Freq: Every day | ORAL | Status: DC
Start: 2021-09-01 — End: 2022-04-09

## 2021-08-31 MED ORDER — LORAZEPAM 0.5 MG PO TABS: 0.5000 mg | ORAL_TABLET | Freq: Two times a day (BID) | ORAL | Status: DC | PRN

## 2021-08-31 MED ORDER — LOSARTAN POTASSIUM 50 MG PO TABS
100.0000 mg | ORAL_TABLET | Freq: Every day | ORAL | Status: DC
  Administered 2021-09-01 – 2021-09-06 (×6): 100 mg via ORAL
  Filled 2021-08-31 (×9): qty 2

## 2021-08-31 MED ORDER — OLANZAPINE 7.5 MG PO TABS
15.0000 mg | ORAL_TABLET | Freq: Every day | ORAL | Status: DC
  Administered 2021-08-31 – 2021-09-05 (×6): 15 mg via ORAL
  Filled 2021-08-31 (×10): qty 2

## 2021-08-31 MED ORDER — LEVOTHYROXINE SODIUM 75 MCG PO TABS
75.0000 ug | ORAL_TABLET | Freq: Every day | ORAL | Status: DC
  Administered 2021-09-01 – 2021-09-06 (×6): 75 ug via ORAL
  Filled 2021-08-31 (×10): qty 1

## 2021-08-31 MED ORDER — MESALAMINE ER 250 MG PO CPCR
1500.0000 mg | ORAL_CAPSULE | Freq: Every day | ORAL | Status: DC
  Administered 2021-09-01 – 2021-09-05 (×5): 1500 mg via ORAL
  Filled 2021-08-31 (×9): qty 6

## 2021-08-31 MED ORDER — TRAZODONE HCL 50 MG PO TABS
50.0000 mg | ORAL_TABLET | Freq: Every evening | ORAL | Status: DC | PRN
  Administered 2021-08-31 – 2021-09-02 (×2): 50 mg via ORAL
  Filled 2021-08-31 (×2): qty 1

## 2021-08-31 MED ORDER — ASPIRIN 81 MG PO TBEC
81.0000 mg | DELAYED_RELEASE_TABLET | Freq: Every day | ORAL | Status: DC
  Administered 2021-09-01 – 2021-09-06 (×6): 81 mg via ORAL
  Filled 2021-08-31 (×10): qty 1

## 2021-08-31 MED ORDER — PANCRELIPASE (LIP-PROT-AMYL) 36000-114000 UNITS PO CPEP
36000.0000 [IU] | ORAL_CAPSULE | Freq: Three times a day (TID) | ORAL | Status: DC
  Filled 2021-08-31: qty 1

## 2021-08-31 MED ORDER — ENSURE ENLIVE PO LIQD
237.0000 mL | Freq: Two times a day (BID) | ORAL | Status: DC
  Administered 2021-09-01 – 2021-09-06 (×2): 237 mL via ORAL
  Filled 2021-08-31 (×17): qty 237

## 2021-08-31 MED ORDER — PANTOPRAZOLE SODIUM 40 MG PO TBEC
40.0000 mg | DELAYED_RELEASE_TABLET | Freq: Every day | ORAL | Status: DC
  Administered 2021-09-02 – 2021-09-06 (×5): 40 mg via ORAL
  Filled 2021-08-31 (×9): qty 1

## 2021-08-31 MED ORDER — BUDESONIDE 3 MG PO CPEP
9.0000 mg | ORAL_CAPSULE | Freq: Every day | ORAL | Status: DC
  Administered 2021-09-01 – 2021-09-02 (×2): 9 mg via ORAL
  Filled 2021-08-31 (×5): qty 3

## 2021-08-31 NOTE — TOC Transition Note (Signed)
Transition of Care [facility]) - CM/SW Discharge Note   Patient Details  Name: Zachary [name] MRN: [id number] Date of Birth: 26-Jan-1960  [facility] of [facility]) CM/SW Contact:  Vassie Moselle, LCSW Phone Number: 08/31/2021, 10:08 AM   Clinical Narrative:    Per Lynnda Shields [facility], pt has been accepted to [facility]. Bed number is 305-2. Accepting provider is Sheran Fava, NP. Attending provider will be Viann Fish, MD. Number for report is [phone]. The pt may arrive by 1400. Pt is under IVC and will need to be transported via Event organiser.     Final next level of care: Psychiatric Hospital Barriers to Discharge: No Barriers Identified   Patient Goals and CMS Choice        Discharge Placement              Patient chooses bed at: Other - please specify in the comment section below: [facility]) Patient to be transferred to facility by: Law Enforcement      Discharge Plan and Services                                     Social Determinants of Health (SDOH) Interventions     Readmission Risk Interventions    08/31/2021    9:50 AM  Readmission Risk Prevention Plan  Transportation Screening Complete  PCP or Specialist Appt within 5-7 Days Complete  Home Care Screening Complete  Medication Review (RN CM) Complete

## 2021-08-31 NOTE — Progress Notes (Signed)
Patient is a 61 year old male who presented to after referral from his PCP. Patient went to the PCP's office after ingesting 30-40 Luvox pills. He states that he was feeling guilty over something that he thought he did, but realized after hospital admission and interview that he's not sure if he did it or not. He decline to elaborate on the event. Patient states that he has lost more than 20lbs recently d/t no appetite. He also stated that he has not attended to hygiene or self care activities because he has a hard time getting out of bed.   Patient was concerned about group activities and sessions because he says that he's and introvert and "strangers make me uncomfortable." RN encouraged him to attend group activities and to speak with MD and LCSW about  concerns and interest in one on one therapy.   Patient's PMH is significant for Crohn's, HTN, Anxiety, Depression, OCD, and IBS. Patient stated that he was previously dx with sleep apnea but never obtained the CPAP. Patient declined use of alcohol and illicit substances.

## 2021-08-31 NOTE — TOC Initial Note (Signed)
Transition of Care [facility]) - Initial/Assessment Note    Patient Details  Name: Zachary [name] MRN: [id number] Date of Birth: 1960-12-23  [facility] CM/SW Contact:    Vassie Moselle, LCSW Phone Number: 08/31/2021, 9:52 AM  Clinical Narrative:                 Patient meets criteria for inpatient treatment per Sheran Fava, NP. No appropriate beds at [facility] currently. CSW faxed referrals to the following facilities for review:  [facility]   [facility]   [facility]   [facility]   [facility]   [facility]   [place] will continue to seek bed placement.       Expected Discharge Plan: Psychiatric Hospital Barriers to Discharge: Psych Bed not available   Patient Goals and CMS Choice        Expected Discharge Plan and Services Expected Discharge Plan: [facility]                                              Prior Living Arrangements/Services              Need for Family Participation in Patient Care: No (Comment) Care giver support system in place?: No (comment)   Criminal Activity/Legal Involvement Pertinent to Current  Situation/Hospitalization: No - Comment as needed  Activities of Daily Living Home Assistive Devices/Equipment: None ADL Screening (condition at time of admission) Patient's cognitive ability adequate to safely complete daily activities?: Yes Is the patient deaf or have difficulty hearing?: No Does the patient have difficulty seeing, even when wearing glasses/contacts?: No Does the patient have difficulty concentrating, remembering, or making decisions?: No Patient able to express need for assistance with ADLs?: Yes Does the patient have difficulty dressing or bathing?: No Independently performs ADLs?: Yes (appropriate for developmental age) Does the patient have difficulty walking or climbing stairs?: No Weakness of Legs: None Weakness of Arms/Hands: None  Permission Sought/Granted                  Emotional Assessment       Orientation: : Oriented to Situation, Oriented to  Time, Oriented to Place, Oriented to Self Alcohol / Substance Use: Not Applicable Psych Involvement: Yes (comment)  Admission diagnosis:  Hypokalemia [E87.6] Suicide attempt ([place]) [T14.91XA] Involuntary commitment [Z04.6] Intentional overdose ([place]) [T50.902A] Drug overdose [T50.901A] Patient Active Problem List   Diagnosis Date Noted   Drug overdose 08/30/2021   Suicide attempt by drug overdose ([place]) 08/29/2021   Intentional overdose ([place]) 08/29/2021   Hypokalemia 08/29/2021   Precordial pain 08/24/2021   Tachycardia 06/15/2021   SOB (shortness of breath) 06/15/2021   Snoring 06/15/2021   Closed  fracture of third metatarsal bone of left foot 06/08/2021   Family history of prostate cancer 09/13/2020   Absence of bladder continence 08/30/2020   Memory loss 08/30/2020   High risk medication use 08/30/2020   Ataxia 08/30/2020   Attention and concentration deficit 08/30/2020   Hypothyroidism 08/30/2020   Incontinence of feces 11/03/2019   OAB (overactive bladder) 10/26/2019   Pancreatic  insufficiency 10/26/2019   Spinal stenosis of lumbar region 03/03/2018   Essential hypertension 01/27/2018   OCD (obsessive compulsive disorder) 10/10/2017   Social anxiety disorder 10/10/2017   Seasonal allergies 06/26/2010   History of herpes labialis 06/26/2010   FLATULENCE ERUCTATION AND GAS PAIN 09/23/2007   Major depression 09/22/2007   CROHN'S DISEASE, LARGE AND SMALL INTESTINES 09/22/2007   COLONIC POLYPS, HYPERPLASTIC, HX OF 09/22/2007   History of Crohn's disease 09/22/2007   PCP:  Denita Lung, MD Pharmacy:   [facility] [phone] - [place], [place] [address] [place] [address] [place] [address] Phone: [phone] Fax: [phone]     Social Determinants of Health (SDOH) Interventions    Readmission Risk Interventions    08/31/2021    9:50 AM  Readmission Risk Prevention Plan  Transportation Screening Complete  PCP or Specialist Appt within 5-7 Days Complete  Home Care Screening Complete  Medication Review (RN CM) Complete

## 2021-08-31 NOTE — Progress Notes (Signed)
Law enforcement called for transport at [phone]. Dispatcher states they will come as soon as they can. AVS printed and reviewed with patient. [place] called at [phone] spoke with Gwyndolyn Saxon to give update about lab results and Gwyndolyn Saxon informed about patient having a bad of home medication that will be sent with [name].

## 2021-08-31 NOTE — Progress Notes (Signed)
[place] called for report at [phone], spoke with Lenna Sciara, RN. Per Melissa pt cannot go prior to COVID resulted.

## 2021-08-31 NOTE — Plan of Care (Signed)

## 2021-08-31 NOTE — Progress Notes (Signed)
Law enforcement arrived to unit for Zachary [name]. Pt going to [facility] via law enforcement. IVC paperwork given to officer as well as AVS and patients home medication bag.

## 2021-08-31 NOTE — Tx Team (Signed)
Initial Treatment Plan 08/31/2021 6:38 PM Zachary [name] [id number]:[id number]    PATIENT STRESSORS: Other: Patient experienced guilt about something he thought he had done, but after assessment at the ED, he realized he doesn't know if he did it or not.     PATIENT STRENGTHS: Active sense of humor  Average or above average intelligence  Capable of independent living  Motivation for treatment/growth  Physical Health  Supportive family/friends    PATIENT IDENTIFIED PROBLEMS: Anxiety  Guilt  Self-esteem                 DISCHARGE CRITERIA:  Improved stabilization in mood, thinking, and/or behavior Motivation to continue treatment in a less acute level of care Verbal commitment to aftercare and medication compliance  PRELIMINARY DISCHARGE PLAN: Attend aftercare/continuing care group Outpatient therapy  PATIENT/FAMILY INVOLVEMENT: This treatment plan has been presented to and reviewed with the patient, Zachary [name].  The patient has been given the opportunity to ask questions and make suggestions.  Lance Bosch, RN 08/31/2021, 6:38 PM

## 2021-08-31 NOTE — [facility] Group Notes (Signed)
Adult Psychoeducational Group Note  Date:  08/31/2021 Time:  9:48 PM  Group Topic/Focus:  Wrap-Up Group:   The focus of this group is to help patients review their daily goal of treatment and discuss progress on daily workbooks.  Participation Level:  Did Not Attend  Participation Quality:   Did not  attend  Affect:   Did not attend  Cognitive:   Did not attend  Insight: None  Engagement in Group:   Did not attend  Modes of Intervention:   Did not attend  Additional Comments:  Pt did not attend wrap up group due laying down in his room.  Viktor Philipp, Georgiann Mccoy 08/31/2021, 9:48 PM

## 2021-08-31 NOTE — Discharge Summary (Signed)
[facility]  Physician Discharge Summary   Patient ID: Zachary [name] MRN: [id number] DOB/AGE: 59-Aug-1962 59 y.o.  Admit date: 08/29/2021 Discharge date:   08/31/2021   PCP: Denita Lung, MD  DISCHARGE DIAGNOSES:  Principal Problem:   Suicide attempt by drug overdose [facility]) Active Problems:   Major depression   Hypokalemia   History of Crohn's disease   Essential hypertension   Pancreatic insufficiency   Hypothyroidism   Intentional overdose (Zachary [name])    RECOMMENDATIONS FOR OUTPATIENT FOLLOW UP: Patient to be transferred to inpatient psychiatric facility   CODE STATUS: Full code  DISCHARGE CONDITION: fair  Diet recommendation: Low-sodium  INITIAL HISTORY: 61 y.o. male with medical history significant for Crohn's disease, anxiety, depression, hoarding disorder, IBS who presented to the ED at the request of his PCP for evaluation. Patient had gone to see his primary care provider where he admitted to have overdosed on 30-40 Luvox tablets, 100 mg each.  Patient admitted to having suicidal ideations. IVC papers were taken out once patient arrived to the ER.   Consultants: Psychiatry   Procedures: None     HOSPITAL COURSE:   Intentional drug overdose/suicidal attempt Patient admitted to taking 30 to 40 tablets of Luvox 100 mg each.  This was discussed with poison control.  Patient does not appear to have any long-term effects from taking these medications. Seen by psychiatry.  They recommended resuming patient's olanzapine and Lamictal and BuSpar.  Lithium and Luvox remains on hold. Patient today is medically cleared for transfer to a psychiatric facility.   Hypokalemia Likely due to GI loss.  He does have a history of Crohn's disease and IBS.  Has diarrhea which is chronic for the most part. Potassium was aggressively repleted and noted to be normal today.   Nausea Resolved after he was given antiemetics.   Major depression Discussed with psychiatry.   Have resumed Lamictal, olanzapine and BuSpar.  Lithium and Lovenox on hold.   Hypothyroidism Continue levothyroxine.   Essential hypertension Continue ARB.  Hold HCTZ since potassium remains low.  Chronic kidney disease stage II-IIIa Creatinine stable for the most part.  He has been urinating.   Pancreatic insufficiency Continue Creon.   History of Crohn's disease Continue budesonide and mesalamine as maintenance treatment.     Patient is medically stable.  Okay for transfer to psychiatric facility when bed is available.   PERTINENT LABS:  The results of significant diagnostics from this hospitalization (including imaging, microbiology, ancillary and laboratory) are listed below for reference.    Microbiology: Recent Results (from the past 240 hour(s))  Resp Panel by RT-PCR (Flu A&B, Covid) Urine, Clean Catch     Status: None   Collection Time: 08/29/21 12:33 PM   Specimen: Urine, Clean Catch; Nasal Swab  Result Value Ref Range Status   SARS Coronavirus 2 by RT PCR NEGATIVE NEGATIVE Final    Comment: (NOTE) SARS-CoV-2 target nucleic acids are NOT DETECTED.  The SARS-CoV-2 RNA is generally detectable in upper respiratory specimens during the acute phase of infection. The lowest concentration of SARS-CoV-2 viral copies this assay can detect is 138 copies/mL. A negative result does not preclude SARS-Cov-2 infection and should not be used as the sole basis for treatment or other patient management decisions. A negative result may occur with  improper specimen collection/handling, submission of specimen other than nasopharyngeal swab, presence of viral mutation(s) within the areas targeted by this assay, and inadequate number of viral copies(<138 copies/mL). A negative result must be combined  with clinical observations, patient history, and epidemiological information. The expected result is Negative.  Fact Sheet for Patients:   EntrepreneurPulse.com.au  Fact Sheet for Healthcare Providers:  IncredibleEmployment.be  This test is no t yet approved or cleared by the [place] FDA and  has been authorized for detection and/or diagnosis of SARS-CoV-2 by FDA under an Emergency Use Authorization (EUA). This EUA will remain  in effect (meaning this test can be used) for the duration of the COVID-19 declaration under Section 564(b)(1) of the Act, 21 U.S.C.section 360bbb-3(b)(1), unless the authorization is terminated  or revoked sooner.       Influenza A by PCR NEGATIVE NEGATIVE Final   Influenza B by PCR NEGATIVE NEGATIVE Final    Comment: (NOTE) The Xpert Xpress SARS-CoV-2/FLU/RSV plus assay is intended as an aid in the diagnosis of influenza from Nasopharyngeal swab specimens and should not be used as a sole basis for treatment. Nasal washings and aspirates are unacceptable for Xpert Xpress SARS-CoV-2/FLU/RSV testing.  Fact Sheet for Patients: EntrepreneurPulse.com.au  Fact Sheet for Healthcare Providers: IncredibleEmployment.be  This test is not yet approved or cleared by the [place] FDA and has been authorized for detection and/or diagnosis of SARS-CoV-2 by FDA under an Emergency Use Authorization (EUA). This EUA will remain in effect (meaning this test can be used) for the duration of the COVID-19 declaration under Section 564(b)(1) of the Act, 21 U.S.C. section 360bbb-3(b)(1), unless the authorization is terminated or revoked.  Performed at [facility], [place] [address] [place], [place] [address]      Labs:   Basic Metabolic Panel: Recent Labs  Lab 08/25/21 1518 08/29/21 1300 08/30/21 0532 08/30/21 1330 08/31/21 0750  NA 139 135 136  --  141  K 3.5 2.2* 2.9* 3.5 3.6  CL 97 100 107  --  108  CO2 33* 22 24  --  28  GLUCOSE 131* 124* 101*  --  97  BUN 14 15 10   --  8  CREATININE 1.36  1.51* 0.81  --  1.46*  CALCIUM 9.2 8.7* 8.1*  --  8.4*  MG  --  2.1  --   --  2.2   Liver Function Tests: Recent Labs  Lab 08/29/21 1300  AST 32  ALT 34  ALKPHOS 64  BILITOT 0.8  PROT 7.0  ALBUMIN 4.0    CBC: Recent Labs  Lab 08/29/21 1300 08/30/21 0532  WBC 12.7* 11.1*  HGB 15.3 13.4  HCT 44.4 38.7*  MCV 91.2 92.1  PLT 402* 331     IMAGING STUDIES VAS US CAROTID  Result Date: 08/29/2021 Carotid Arterial Duplex Study Patient Name:  AASHIR UMHOLTZ  Date of Exam:   08/28/2021 Medical Rec #: [id number]           Accession #:    [id number] Date of Birth: [birth year]           Patient Gender: M Patient Age:   41 years Exam Location:  [facility] Procedure:      VAS US CAROTID Referring Phys: Minus Breeding --------------------------------------------------------------------------------  Indications:       Bilateral bruits and patient has a h/o ataxia and memory                    loss. He denies any other cerebrovascular symptoms. Risk Factors:      Hypertension, past history of smoking. Comparison Study:  NA Performing Technologist: Sharlett Iles RVT  Examination Guidelines: A complete evaluation includes B-mode imaging,  spectral Doppler, color Doppler, and power Doppler as needed of all accessible portions of each vessel. Bilateral testing is considered an integral part of a complete examination. Limited examinations for reoccurring indications may be performed as noted.  Right Carotid Findings: +----------+--------+--------+--------+------------------+--------+           PSV cm/sEDV cm/sStenosisPlaque DescriptionComments +----------+--------+--------+--------+------------------+--------+ CCA Prox  74      19                                         +----------+--------+--------+--------+------------------+--------+ CCA Distal67      24                                         +----------+--------+--------+--------+------------------+--------+ ICA Prox  73      21       Normal                             +----------+--------+--------+--------+------------------+--------+ ICA Mid   53      22                                tortuous +----------+--------+--------+--------+------------------+--------+ ICA Distal59      28                                tortuous +----------+--------+--------+--------+------------------+--------+ ECA       99      17                                         +----------+--------+--------+--------+------------------+--------+ +----------+--------+-------+----------------+-------------------+           PSV cm/sEDV cmsDescribe        Arm Pressure (mmHG) +----------+--------+-------+----------------+-------------------+ [id number]            Multiphasic, [id number]                 +----------+--------+-------+----------------+-------------------+ +---------+--------+--+--------+--+---------+ VertebralPSV cm/s45EDV cm/s19Antegrade +---------+--------+--+--------+--+---------+  Left Carotid Findings: +----------+--------+--------+--------+------------------+----------+           PSV cm/sEDV cm/sStenosisPlaque DescriptionComments   +----------+--------+--------+--------+------------------+----------+ CCA Prox  89      18                                           +----------+--------+--------+--------+------------------+----------+ CCA Distal65      21                                           +----------+--------+--------+--------+------------------+----------+ ICA Prox  55      22      Normal                               +----------+--------+--------+--------+------------------+----------+ ICA Mid   75      34  steep dive +----------+--------+--------+--------+------------------+----------+ ICA Distal67      31                                steep dive +----------+--------+--------+--------+------------------+----------+ ECA       57      17                                            +----------+--------+--------+--------+------------------+----------+ +----------+--------+--------+----------------+-------------------+           PSV cm/sEDV cm/sDescribe        Arm Pressure (mmHG) +----------+--------+--------+----------------+-------------------+ [id number]             Multiphasic, [id number]                 +----------+--------+--------+----------------+-------------------+ +---------+--------+--+--------+--+---------+ VertebralPSV cm/s50EDV cm/s16Antegrade +---------+--------+--+--------+--+---------+   Summary: Right Carotid: There is no evidence of stenosis in the right ICA. There was no                evidence of thrombus, dissection, atherosclerotic plaque or                stenosis in the cervical carotid system. Left Carotid: There is no evidence of stenosis in the left ICA. There was no               evidence of thrombus, dissection, atherosclerotic plaque or               stenosis in the cervical carotid system. Vertebrals:  Bilateral vertebral arteries demonstrate antegrade flow. Subclavians: Normal flow hemodynamics were seen in bilateral subclavian              arteries. *See table(s) above for measurements and observations.  Electronically signed by Carlyle Dolly MD on 08/29/2021 at 10:06:25 AM.    Final    Cardiac event monitor  Result Date: 08/11/2021 Normal sinus rhythm Sinus bradycardia No sustained arrhythmias   DISCHARGE EXAMINATION: Vitals:   08/30/21 0615 08/30/21 1430 08/30/21 2028 08/31/21 0440  BP: (!) 162/98 (!) 157/94 134/84 132/87  Pulse: 65 84 88 68  Resp: 17 18 18 18   Temp: 97.7 F (36.5 C) 98.4 F (36.9 C) 98.7 F (37.1 C) 98.3 F (36.8 C)  TempSrc: Oral Oral Oral Oral  SpO2: 97% 95% 98% 96%  Weight:      Height:       General appearance: Awake alert.  In no distress Resp: Clear to auscultation bilaterally.  Normal effort Cardio: S1-S2 is normal regular.  No S3-S4.  No rubs  murmurs or bruit GI: Abdomen is soft.  Nontender nondistended.  Bowel sounds are present normal.  No masses organomegaly   DISPOSITION: Psychiatric facility  Discharge Instructions     Diet - low sodium heart healthy   Complete by: As directed          Allergies as of 08/31/2021       Reactions   Erythromycin Other (See Comments)   Patient said he is "allergic," but the reaction was not cited   Penicillins Other (See Comments)   "It may kill me"   Tetracyclines & Related Hives        Medication List     STOP taking these medications    fluvoxaMINE 100 MG tablet Commonly known as: LUVOX   lithium carbonate 150 MG capsule   losartan-hydrochlorothiazide  100-12.5 MG tablet Commonly known as: HYZAAR       TAKE these medications    aspirin EC 81 MG tablet Take 81 mg by mouth daily. Swallow whole.   budesonide 3 MG 24 hr capsule Commonly known as: ENTOCORT EC TAKE 3 CAPSULES BY MOUTH EVERY DAY   busPIRone 30 MG tablet Commonly known as: BUSPAR Take 1 tablet (30 mg total) by mouth 2 (two) times daily.   Creon 36000 UNITS Cpep capsule Generic drug: lipase/protease/amylase TAKE 4 CAPSULES BY MOUTH BEFORE A MEAL& 2 WITH EACH SNACK   donepezil 10 MG tablet Commonly known as: ARICEPT Take 1 tablet (10 mg total) by mouth at bedtime.   lamoTRIgine 200 MG tablet Commonly known as: LAMICTAL Take 1 tablet (200 mg total) by mouth daily.   levothyroxine 75 MCG tablet Commonly known as: SYNTHROID TAKE 1 TABLET(75 MCG) BY MOUTH DAILY   loperamide 2 MG capsule Commonly known as: IMODIUM Take 1 capsule (2 mg total) by mouth every 4 (four) hours. As needed   LORazepam 0.5 MG tablet Commonly known as: ATIVAN 1-2 daily as needed for anxiety   losartan 100 MG tablet Commonly known as: COZAAR Take 1 tablet (100 mg total) by mouth daily. Start taking on: September 01, 2021   mesalamine 0.375 g 24 hr capsule Commonly known as: Apriso Take 4 capsules (1.5 g  total) by mouth daily.   OLANZapine 15 MG tablet Commonly known as: ZYPREXA Take 1 tablet (15 mg total) by mouth at bedtime.   pantoprazole 40 MG tablet Commonly known as: PROTONIX Take 1 tablet (40 mg total) by mouth daily at 12 noon.   sodium chloride 1 g tablet TAKE 1 TABLET(1 GRAM) BY MOUTH THREE TIMES DAILY What changed: See the new instructions.           TOTAL DISCHARGE TIME: 35 minutes  Sinclair Arrazola [employer] on [email or web address]  08/31/2021, 10:13 AM

## 2021-08-31 NOTE — Consult Note (Addendum)
  Patient is seen and assessed by the psychiatric nurse practitioner.  Writer from the introduced self and reason for visit.  Patient is advised that he currently continues to meet inpatient psychiatric criteria, and given the opportunity to answer any questions as this is his first inpatient psych admission.  While patient does not have any questions regarding his hospital stay, he does make mention of new onset" flashbacks.  I remember what I did now.  I committed a crime.  I need to know how much of the information I tell you will be used against me?"  Patient does inquire about his legal rights, in which I was able to inform him of protected information, consents, HIPAA compliance, and legal proceedings.  He does become very tearful during this discussion, citing his reason to continue to hold with information.  He is very apologetic as well, and chose not to release any additional information.  Prior to terminating the interview patient did inquire about certain hospital expectations; and he is encouraged to participate daily in all groups and be visible in the milieu.  He is advised that if he chooses to remain in his room throughout the duration of his high inpatient hospital stay it will result in extended length of stay.  He verbalizes understanding.  Patient did not divulge any additional information at this time regarding a crime he possibly committed.  Patient does deny any homicidal ideations or auditory visual hallucinations.  He further denies any history of violence, aggression, combative behavior, and or pending legal charges.  Writer did attempt to obtain additional information from patient regarding flashbacks and crime.  He would not release any information.  Will defer to inpatient behavioral health.   Patient continues to meet inpatient psychiatric criteria at this time.  He has been accepted to [facility] on August 31, 2021.  His room assignment is 3 0 5-2.  Accepting  physician Dr. Tyson Alias.  Will obtain repeat COVID test.  Transfer orders have been placed, home medications have been resumed and will be continued upon admission to behavioral health Hospital.  Psychiatry consult service to sign off at this time.  The above information has been communicated to Dr. Maryland Pink primary attending, Lynnda Shields behavioral health administration coordinator, Sheran Fava, PMHNP-BC, Dr. Lovette Cliche attending psychiatrist, Caren Macadam, medical social worker, and [facility] staff RN.

## 2021-09-01 ENCOUNTER — Encounter (HOSPITAL_COMMUNITY): Payer: Self-pay

## 2021-09-01 ENCOUNTER — Encounter (HOSPITAL_COMMUNITY): Payer: Self-pay | Admitting: Family

## 2021-09-01 ENCOUNTER — Inpatient Hospital Stay (HOSPITAL_COMMUNITY): Payer: 59

## 2021-09-01 ENCOUNTER — Other Ambulatory Visit: Payer: Self-pay

## 2021-09-01 LAB — CBC WITH DIFFERENTIAL/PLATELET
Abs Immature Granulocytes: 0.02 10*3/uL (ref 0.00–0.07)
Basophils Absolute: 0.1 10*3/uL (ref 0.0–0.1)
Basophils Relative: 1 %
Eosinophils Absolute: 0.2 10*3/uL (ref 0.0–0.5)
Eosinophils Relative: 2 %
HCT: 41.7 % (ref 39.0–52.0)
Hemoglobin: 13.7 g/dL (ref 13.0–17.0)
Immature Granulocytes: 0 %
Lymphocytes Relative: 20 %
Lymphs Abs: 2.1 10*3/uL (ref 0.7–4.0)
MCH: 31.6 pg (ref 26.0–34.0)
MCHC: 32.9 g/dL (ref 30.0–36.0)
MCV: 96.3 fL (ref 80.0–100.0)
Monocytes Absolute: 0.9 10*3/uL (ref 0.1–1.0)
Monocytes Relative: 9 %
Neutro Abs: 7.3 10*3/uL (ref 1.7–7.7)
Neutrophils Relative %: 68 %
Platelets: 349 10*3/uL (ref 150–400)
RBC: 4.33 MIL/uL (ref 4.22–5.81)
RDW: 12.8 % (ref 11.5–15.5)
WBC: 10.5 10*3/uL (ref 4.0–10.5)
nRBC: 0 % (ref 0.0–0.2)

## 2021-09-01 LAB — BASIC METABOLIC PANEL
Anion gap: 8 (ref 5–15)
BUN: 11 mg/dL (ref 6–20)
CO2: 29 mmol/L (ref 22–32)
Calcium: 8.7 mg/dL — ABNORMAL LOW (ref 8.9–10.3)
Chloride: 103 mmol/L (ref 98–111)
Creatinine, Ser: 1.33 mg/dL — ABNORMAL HIGH (ref 0.61–1.24)
GFR, Estimated: >60 mL/min (ref >60)
Glucose, Bld: 102 mg/dL — ABNORMAL HIGH (ref 70–99)
Potassium: 3.9 mmol/L (ref 3.5–5.1)
Sodium: 140 mmol/L (ref 135–145)

## 2021-09-01 LAB — URINALYSIS, ROUTINE W REFLEX MICROSCOPIC
Bacteria, UA: NONE SEEN
Bilirubin Urine: NEGATIVE
Glucose, UA: NEGATIVE mg/dL
Ketones, ur: NEGATIVE mg/dL
Leukocytes,Ua: NEGATIVE
Nitrite: NEGATIVE
Protein, ur: NEGATIVE mg/dL
Specific Gravity, Urine: 1.002 — ABNORMAL LOW (ref 1.005–1.030)
pH: 6 (ref 5.0–8.0)

## 2021-09-01 LAB — TROPONIN I (HIGH SENSITIVITY): Troponin I (High Sensitivity): 4 ng/L (ref <18)

## 2021-09-01 LAB — MAGNESIUM: Magnesium: 2.2 mg/dL (ref 1.7–2.4)

## 2021-09-01 MED ORDER — PANCRELIPASE (LIP-PROT-AMYL) 36000-114000 UNITS PO CPEP
72000.0000 [IU] | ORAL_CAPSULE | ORAL | Status: DC | PRN
  Administered 2021-09-01: 72000 [IU] via ORAL
  Filled 2021-09-01: qty 6

## 2021-09-01 MED ORDER — PANCRELIPASE (LIP-PROT-AMYL) 36000-114000 UNITS PO CPEP
36000.0000 [IU] | ORAL_CAPSULE | Freq: Three times a day (TID) | ORAL | Status: DC
  Administered 2021-09-01: 36000 [IU] via ORAL
  Filled 2021-09-01 (×4): qty 1

## 2021-09-01 MED ORDER — NICOTINE POLACRILEX 2 MG MT GUM: 2.0000 mg | CHEWING_GUM | OROMUCOSAL | Status: DC | PRN

## 2021-09-01 MED ORDER — FLUVOXAMINE MALEATE 100 MG PO TABS
100.0000 mg | ORAL_TABLET | Freq: Every day | ORAL | Status: DC
  Administered 2021-09-02 – 2021-09-03 (×2): 100 mg via ORAL
  Filled 2021-09-01 (×4): qty 1

## 2021-09-01 MED ORDER — PANCRELIPASE (LIP-PROT-AMYL) 36000-114000 UNITS PO CPEP
144000.0000 [IU] | ORAL_CAPSULE | Freq: Three times a day (TID) | ORAL | Status: DC
  Administered 2021-09-01: 48000 [IU] via ORAL
  Administered 2021-09-01 – 2021-09-02 (×2): 144000 [IU] via ORAL
  Filled 2021-09-01 (×7): qty 12

## 2021-09-01 MED ORDER — DONEPEZIL HCL 10 MG PO TABS
10.0000 mg | ORAL_TABLET | Freq: Every day | ORAL | Status: DC
  Administered 2021-09-02 – 2021-09-05 (×4): 10 mg via ORAL
  Filled 2021-09-01 (×7): qty 1

## 2021-09-01 NOTE — [facility] Suicide Risk Assessment (Signed)
Suicide Risk Assessment  Admission Assessment    [facility] Admission Suicide Risk Assessment   Nursing information obtained from:  Patient  Demographic factors:  Male, Caucasian, Living alone  Current Mental Status:  NA  Loss Factors:  NA  Historical Factors:  Impulsivity  Risk Reduction Factors:  Sense of responsibility to family  Total Time spent with patient: 1 hour  Principal Problem: MDD (major depressive disorder), recurrent episode, severe ([place])  Diagnosis:  Principal Problem:   MDD (major depressive disorder), recurrent episode, severe ([place]) Active Problems:   Suicide attempt by drug overdose ([place])  Subjective Data: See H&P.  Continued Clinical Symptoms:  Alcohol Use Disorder Identification Test Final Score (AUDIT): 0 The "Alcohol Use Disorders Identification Test", Guidelines for Use in Primary Care, Second Edition.  World Pharmacologist [facility]). Score between 0-7:  no or low risk or alcohol related problems. Score between 8-15:  moderate risk of alcohol related problems. Score between 16-19:  high risk of alcohol related problems. Score 20 or above:  warrants further diagnostic evaluation for alcohol dependence and treatment.  CLINICAL FACTORS:   Severe Anxiety and/or Agitation Depression:   Impulsivity Obsessive-Compulsive Disorder Previous Psychiatric Diagnoses and Treatments Medical Diagnoses and Treatments/Surgeries  Musculoskeletal: Strength & Muscle Tone: within normal limits Gait & Station: normal Patient leans: N/A  Psychiatric Specialty Exam:  Presentation  General Appearance: Appropriate for Environment; Disheveled; Casual  Eye Contact:Good  Speech:Clear and Coherent; Normal Rate  Speech Volume:Normal  Handedness:Right  Mood and Affect  Mood:Anxious (reports, "excessive guilt-feeling".)  Affect:Congruent  Thought Process  Thought Processes:Coherent; Goal Directed  Descriptions of Associations:Intact  Orientation:Full (Time,  Place and Person)  Thought Content:Logical  History of Schizophrenia/Schizoaffective disorder:No data recorded Duration of Psychotic Symptoms:No data recorded Hallucinations:Hallucinations: None  Ideas of Reference:None  Suicidal Thoughts:Suicidal Thoughts: No  Homicidal Thoughts:Homicidal Thoughts: No  Sensorium  Memory:Immediate Good; Recent Fair; Remote Poor  Judgment:Good  Insight:Lacking  Executive Functions  Concentration:Good  Attention Span:Good  [place] of Knowledge:Good  Language:Good  Psychomotor Activity  Psychomotor Activity:Psychomotor Activity: Normal  Assets  Assets:Communication Skills; Desire for Improvement; Financial Resources/Insurance; Housing; Resilience; Social Support; Physical Health  Sleep  Sleep:Sleep: Good Number of Hours of Sleep: 7.5  Physical Exam: Blood pressure 131/62, pulse 91, temperature 98.8 F (37.1 C), temperature source Oral, resp. rate 18, height 6' 3"  (1.905 m), weight 107.5 kg, SpO2 98 %. Body mass index is 29.62 kg/m.   COGNITIVE FEATURES THAT CONTRIBUTE TO RISK:  Closed-mindedness, Polarized thinking, and Thought constriction (tunnel vision)    SUICIDE RISK:   Severe:  Frequent, intense, and enduring suicidal ideation, specific plan, no subjective intent, but some objective markers of intent (i.e., choice of lethal method), the method is accessible, some limited preparatory behavior, evidence of impaired self-control, severe dysphoria/symptomatology, multiple risk factors present, and few if any protective factors, particularly a lack of social support.  PLAN OF CARE: See H&P for plan of care.  I certify that inpatient services furnished can reasonably be expected to improve the patient's condition.   Lindell Spar, NP, pmhnp. Fnp-bc. 09/01/2021, 2:35 PM

## 2021-09-01 NOTE — Progress Notes (Signed)
Pt affect anxious, mood depressed, pt constantly at the nursing station, tangential states he has not slept in several days, only watches "youtube all night." Pt at first refused zyprexa, states it doesn't help him, but then received it as requested later. Pt asking for "depends" due to incontinence of stool at times due to crohn's. Reports possibly a family member could bring in for him. Currently denies SI/HI or hallucinations (a) 15 min checks (r) Able to fall asleep quickly at bedtime, safety maintained.

## 2021-09-01 NOTE — [facility] Suicide Risk Assessment (Signed)
[facility] INPATIENT:  Family/Significant Other Suicide Prevention Education  Suicide Prevention Education:  Education Completed; Quintrell Baze, brother [phone]   (name of family member/significant other) has been identified by the patient as the family member/significant other with whom the patient will be residing, and identified as the person(s) who will aid the patient in the event of a mental health crisis (suicidal ideations/suicide attempt).  With written consent from the patient, the family member/significant other has been provided the following suicide prevention education, prior to the and/or following the discharge of the patient.  The suicide prevention education provided includes the following: Suicide risk factors Suicide prevention and interventions National Suicide Hotline telephone number [facility] assessment telephone number [facility] Emergency Assistance [place] and/or Residential Mobile Crisis Unit telephone number  Request made of family/significant other to: Remove weapons (e.g., guns, rifles, knives), all items previously/currently identified as safety concern.   Remove drugs/medications (over-the-counter, prescriptions, illicit drugs), all items previously/currently identified as a safety concern. CSW received verbal consent to speak with pt in regards to safety precautions upon discharge and any pertinent information pertaining to pt's admission. Pt's brother reported pt has been somewhat confused and appetite has decreased within the last couple of days. Pt's brother reported, pt wrote and mailed a suicide note to his address. Pt wrote in the note, " I think I have done something wrong, I do not know if I actually did the awful thing, unsure if its perception or realty" Pt's brother reported he could not get his brother to share "the awful thing". Pt's brother reported pt does not have any guns in the home, has limited amount of forks/knives in  the home. Pt's brother reported that he will remain in contact with pt to assist with discharge.  The family member/significant other verbalizes understanding of the suicide prevention education information provided.  The family member/significant other agrees to remove the items of safety concern listed above.  Carie Caddy 09/01/2021, 12:52 PM

## 2021-09-01 NOTE — Group Note (Signed)
  LCSW Group Therapy Note  09/01/2021    1:00 - 2:00 PM               Type of Therapy and Topic:  Group Therapy: Understanding the differences between fear and anxiety / Fear Ladder & Examining the Evidence  Participation Level:  Active   Description of Group:   In this group session, patients learned how to define and recognize the similarities differences between fear and anxiety. Patients will explore reactions we have to fear and anxiety. Patients identified a fear or something they feel anxious about. Patients analyzed scenarios and discussed both the positive and negative aspects to them. Patients were asked to identify if it is a fear or anxiety as well.  Patients were asked to provide their own examples. Patients will learn what to do for both fear and anxiety. CSW provided tools such as breaking things up into smaller steps, challenging automatic negative thinking / questions to ask, fear ladder and how to use gradual exposure. Patients will be asked to practice each tool with situations and to complete a fear ladder for their personal gain.   Therapeutic Goals: Patients will learn the difference between fear versus anxiety and the definitions of both. Patients will utilize scenarios and be able to identify if this is an example of a fear or anxiety. Patients will learn tools to handle both their fears and anxieties as well as discuss breaking it into smaller steps and exposure.  Patients will be asked to provide examples of their own and discuss how things have both positive and negative aspects.  Patients were provided questions to ask to challenge automatic negative thinking for anxiety.  Patients were provided a sample form of a fear ladder and asked to complete one for a fear.   Summary of Patient Progress:  Patient was engaged and participated in the group session. Patient shared their understanding of fear and anxiety . Discussed scenarios concerning "unhealthy fear and appropriate  situational awareness and vigilance. Patient shared one fear and one anxiety. Patient reports their plan to help with fear/anxiety to be .   Therapeutic Modalities:   Cognitive Behavioral Therapy Motivational Interviewing  Brief Therapy  Windle Guard, [place]  09/01/2021 2:51 PM

## 2021-09-01 NOTE — Progress Notes (Signed)
Pt arrived back to unit from [facility] ED. Pt in stable mood. Denied any complaints. Pt given snack and bedtime meds.

## 2021-09-01 NOTE — BH IP Treatment Plan (Signed)
Interdisciplinary Treatment and Diagnostic Plan Update  09/01/2021 Time of Session: [place] MRN: [id number]  Principal Diagnosis: MDD (major depressive disorder), recurrent episode, severe ([place])  Secondary Diagnoses: Principal Problem:   MDD (major depressive disorder), recurrent episode, severe ([place]) Active Problems:   Suicide attempt by drug overdose ([place])   Current Medications:  Current Facility-Administered Medications  Medication Dose Route Frequency Provider Last Rate Last Admin   acetaminophen (TYLENOL) tablet 650 mg  650 mg Oral Q6H PRN Suella Broad, FNP       Or   acetaminophen (TYLENOL) suppository 650 mg  650 mg Rectal Q6H PRN Starkes-Perry, Gayland Curry, FNP       alum & mag hydroxide-simeth (MAALOX/MYLANTA) 200-200-20 MG/5ML suspension 30 mL  30 mL Oral Q4H PRN Starkes-Perry, Gayland Curry, FNP       aspirin EC tablet 81 mg  81 mg Oral Daily Suella Broad, FNP   81 mg at 09/01/21 0819   budesonide (ENTOCORT EC) 24 hr capsule 9 mg  9 mg Oral Daily Suella Broad, FNP   9 mg at 09/01/21 0855   busPIRone (BUSPAR) tablet 30 mg  30 mg Oral BID Suella Broad, FNP   30 mg at 09/01/21 0819   feeding supplement (ENSURE ENLIVE / ENSURE PLUS) liquid 237 mL  237 mL Oral BID BM Nelda Marseille, Amy E, MD   237 mL at 09/01/21 1017   lamoTRIgine (LAMICTAL) tablet 200 mg  200 mg Oral Daily Suella Broad, FNP   200 mg at 09/01/21 0817   levothyroxine (SYNTHROID) tablet 75 mcg  75 mcg Oral Q0600 Suella Broad, FNP   75 mcg at 09/01/21 0620   lipase/protease/amylase (CREON) capsule 36,000 Units  36,000 Units Oral TID Imagene Riches, MD   36,000 Units at 09/01/21 0819   loperamide (IMODIUM) capsule 2 mg  2 mg Oral Q4H PRN Suella Broad, FNP       LORazepam (ATIVAN) tablet 0.5 mg  0.5 mg Oral BID PRN Suella Broad, FNP       losartan (COZAAR) tablet 100 mg  100 mg Oral Daily Suella Broad, FNP   100 mg at  09/01/21 0818   magnesium hydroxide (MILK OF MAGNESIA) suspension 30 mL  30 mL Oral Daily PRN Suella Broad, FNP       mesalamine (PENTASA) CR capsule 1,500 mg  1,500 mg Oral Daily Suella Broad, FNP   1,500 mg at 09/01/21 0856   OLANZapine (ZYPREXA) tablet 15 mg  15 mg Oral QHS Suella Broad, FNP   15 mg at 08/31/21 2151   pantoprazole (PROTONIX) EC tablet 40 mg  40 mg Oral Daily Starkes-Perry, Gayland Curry, FNP       traZODone (DESYREL) tablet 50 mg  50 mg Oral QHS PRN Evette Georges, NP   50 mg at 08/31/21 2112   PTA Medications: Medications Prior to Admission  Medication Sig Dispense Refill Last Dose   aspirin 81 MG EC tablet Take 81 mg by mouth daily. Swallow whole.      budesonide (ENTOCORT EC) 3 MG 24 hr capsule TAKE 3 CAPSULES BY MOUTH EVERY DAY (Patient taking differently: Take 9 mg by mouth daily.) 90 capsule 5    busPIRone (BUSPAR) 30 MG tablet Take 1 tablet (30 mg total) by mouth 2 (two) times daily. 180 tablet 0    CREON 36000-114000 units CPEP capsule TAKE 4 CAPSULES BY MOUTH BEFORE A MEAL& 2 WITH EACH SNACK 1440 capsule  3    donepezil (ARICEPT) 10 MG tablet Take 1 tablet (10 mg total) by mouth at bedtime. 90 tablet 3    lamoTRIgine (LAMICTAL) 200 MG tablet Take 1 tablet (200 mg total) by mouth daily. 90 tablet 0    levothyroxine (SYNTHROID) 75 MCG tablet TAKE 1 TABLET(75 MCG) BY MOUTH DAILY 90 tablet 0    loperamide (IMODIUM) 2 MG capsule Take 1 capsule (2 mg total) by mouth every 4 (four) hours. As needed 60 capsule 2    LORazepam (ATIVAN) 0.5 MG tablet 1-2 daily as needed for anxiety 30 tablet 0    losartan (COZAAR) 100 MG tablet Take 1 tablet (100 mg total) by mouth daily.      mesalamine (APRISO) 0.375 g 24 hr capsule Take 4 capsules (1.5 g total) by mouth daily. 120 capsule 11    OLANZapine (ZYPREXA) 15 MG tablet Take 1 tablet (15 mg total) by mouth at bedtime. 90 tablet 0    pantoprazole (PROTONIX) 40 MG tablet Take 1 tablet (40 mg total) by mouth daily  at 12 noon.      sodium chloride 1 g tablet TAKE 1 TABLET(1 GRAM) BY MOUTH THREE TIMES DAILY (Patient taking differently: Take 1 g by mouth 3 (three) times daily.) 90 tablet 0     Patient Stressors: Other: Patient experienced guilt about something he thought he had done, but after assessment at the ED, he realized he doesn't know if he did it or not.    Patient Strengths: Active sense of humor  Average or above average intelligence  Capable of independent living  Motivation for treatment/growth  Physical Health  Supportive family/friends   Treatment Modalities: Medication Management, Group therapy, Case management,  1 to 1 session with clinician, Psychoeducation, Recreational therapy.   Physician Treatment Plan for Primary Diagnosis: MDD (major depressive disorder), recurrent episode, severe ([place]) Long Term Goal(s): Improvement in symptoms so as ready for discharge   Short Term Goals: Ability to identify and develop effective coping behaviors will improve Ability to maintain clinical measurements within normal limits will improve Compliance with prescribed medications will improve Ability to identify changes in lifestyle to reduce recurrence of condition will improve Ability to verbalize feelings will improve Ability to disclose and discuss suicidal ideas Ability to demonstrate self-control will improve  Medication Management: Evaluate patient's response, side effects, and tolerance of medication regimen.  Therapeutic Interventions: 1 to 1 sessions, Unit Group sessions and Medication administration.  Evaluation of Outcomes: Not Met  Physician Treatment Plan for Secondary Diagnosis: Principal Problem:   MDD (major depressive disorder), recurrent episode, severe ([place]) Active Problems:   Suicide attempt by drug overdose ([place])  Long Term Goal(s): Improvement in symptoms so as ready for discharge   Short Term Goals: Ability to identify and develop effective coping behaviors will  improve Ability to maintain clinical measurements within normal limits will improve Compliance with prescribed medications will improve Ability to identify changes in lifestyle to reduce recurrence of condition will improve Ability to verbalize feelings will improve Ability to disclose and discuss suicidal ideas Ability to demonstrate self-control will improve     Medication Management: Evaluate patient's response, side effects, and tolerance of medication regimen.  Therapeutic Interventions: 1 to 1 sessions, Unit Group sessions and Medication administration.  Evaluation of Outcomes: Not Met   RN Treatment Plan for Primary Diagnosis: MDD (major depressive disorder), recurrent episode, severe ([place]) Long Term Goal(s): Knowledge of disease and therapeutic regimen to maintain health will improve  Short Term Goals: Ability  to remain free from injury will improve, Ability to verbalize frustration and anger appropriately will improve, Ability to demonstrate self-control, Ability to participate in decision making will improve, Ability to verbalize feelings will improve, Ability to disclose and discuss suicidal ideas, Ability to identify and develop effective coping behaviors will improve, and Compliance with prescribed medications will improve  Medication Management: RN will administer medications as ordered by provider, will assess and evaluate patient's response and provide education to patient for prescribed medication. RN will report any adverse and/or side effects to prescribing provider.  Therapeutic Interventions: 1 on 1 counseling sessions, Psychoeducation, Medication administration, Evaluate responses to treatment, Monitor vital signs and CBGs as ordered, Perform/monitor CIWA, COWS, AIMS and Fall Risk screenings as ordered, Perform wound care treatments as ordered.  Evaluation of Outcomes: Not Met   LCSW Treatment Plan for Primary Diagnosis: MDD (major depressive disorder), recurrent  episode, severe ([place]) Long Term Goal(s): Safe transition to appropriate next level of care at discharge, Engage patient in therapeutic group addressing interpersonal concerns.  Short Term Goals: Engage patient in aftercare planning with referrals and resources, Increase social support, Increase ability to appropriately verbalize feelings, Increase emotional regulation, Facilitate acceptance of mental health diagnosis and concerns, Facilitate patient progression through stages of change regarding substance use diagnoses and concerns, Identify triggers associated with mental health/substance abuse issues, and Increase skills for wellness and recovery  Therapeutic Interventions: Assess for all discharge needs, 1 to 1 time with Social worker, Explore available resources and support systems, Assess for adequacy in community support network, Educate family and significant other(s) on suicide prevention, Complete Psychosocial Assessment, Interpersonal group therapy.  Evaluation of Outcomes: Not Met   Progress in Treatment: Attending groups: Yes. Participating in groups: Yes. Taking medication as prescribed: Yes. Toleration medication: Yes. Family/Significant other contact made: No, will contact:  CSM will assess Patient understands diagnosis: Yes. Discussing patient identified problems/goals with staff: Yes. Medical problems stabilized or resolved: No patient c/o stool leakage and has requested a room by himself Denies suicidal/homicidal ideation: Yes. Issues/concerns per patient self-inventory: No. Other:   New problem(s) identified: No, Describe:  None reported  New Short Term/Long Term Goal(s): detox, medication management for mood stabilization; elimination of SI thoughts; development of comprehensive mental wellness/sobriety plan  Patient Goals:  Patient asserts that he wants to distinguish between perception and reality   Discharge Plan or Barriers: Patient recently admitted. CSW will  continue to follow and assess for appropriate referrals and possible discharge planning.    Reason for Continuation of Hospitalization: Depression Medication stabilization Suicidal ideation Withdrawal symptoms  Estimated Length of Stay: 3-7  Last Guys Suicide Severity Risk Score: [place] Admission (Current) from 08/31/2021 in [facility] ED to Hosp-Admission (Discharged) from 08/29/2021 in [place] ED from 07/21/2021 in [place] DEPT  C-SSRS RISK CATEGORY No Risk High Risk No Risk       Last PHQ 2/9 Scores:    07/03/2021    9:05 AM 01/17/2021    9:39 AM 09/13/2020    8:37 AM  Depression screen PHQ 2/9  Decreased Interest 1 0 0  Down, Depressed, Hopeless 1 0 0  PHQ - 2 Score 2 0 0  Altered sleeping 0 0 0  Tired, decreased energy 0 3 0  Change in appetite  0 0  Feeling bad or failure about yourself  1 0 0  Trouble concentrating 1 0 2  Moving slowly or fidgety/restless  1 0 2  Suicidal thoughts 1 0 0  PHQ-9 Score 6 3 4   Difficult doing work/chores Very difficult Not difficult at all Very difficult    Scribe for Treatment Team: Windle Guard, [place] 09/01/2021 10:43 AM
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
## 2021-09-01 NOTE — BHH Counselor (Signed)
Adult Comprehensive Assessment  Patient ID: Zachary [name], male   DOB: August 24, 1960, 61 y.o.   MRN: [id number]  Information Source: Information source: Patient  Current Stressors:  Patient states their primary concerns and needs for treatment are:: " i need to understand the difference between perception and reality- I really don't know" Patient states their goals for this hospitilization and ongoing recovery are:: " i want skills to know the difference perception and reality and how to deal with guilt" Educational / Learning stressors: Pt denies Employment / Job issues: Pt denies Family Relationships: Pt denies Museum/gallery curator / Lack of resources (include bankruptcy): Pt denies Housing / Lack of housing: Pt denies Physical health (include injuries & life threatening diseases): Pt denies Social relationships: Pt denies Substance abuse: Pt denies Bereavement / Loss: " my parents  and grandparents are deceased"  Living/Environment/Situation:  Living Arrangements: Alone Living conditions (as described by patient or guardian): " I live in a 2 bdrm apt" Who else lives in the home?: " I live alone" How long has patient lived in current situation?: " 15 yrs"" What is atmosphere in current home: Comfortable, Supportive  Family History:  Marital status: Single Are you sexually active?: Yes What is your sexual orientation?: " I am gay" Has your sexual activity been affected by drugs, alcohol, medication, or emotional stress?: "no" Does patient have children?: No  Childhood History:  By whom was/is the patient raised?: Both parents Additional childhood history information: na Description of patient's relationship with caregiver when they were a child: " very good" Patient's description of current relationship with people who raised him/her: " they are deceased" How were you disciplined when you got in trouble as a child/adolescent?: " iwas grounded" Does patient have siblings?: Yes Number of  Siblings: 1 Description of patient's current relationship with siblings: " good" Did patient suffer any verbal/emotional/physical/sexual abuse as a child?: No Did patient suffer from severe childhood neglect?: No Has patient ever been sexually abused/assaulted/raped as an adolescent or adult?: No Was the patient ever a victim of a crime or a disaster?: No Witnessed domestic violence?: No Has patient been affected by domestic violence as an adult?: No  Education:  Highest grade of school patient has completed: Therapist, nutritional at [employer] Currently a Ship broker?: No Learning disability?: No  Employment/Work Situation:   Employment Situation: Employed 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 How Long has Patient Been Employed?: 15 yrs Are You Satisfied With Your Job?: Yes Do You Work More Than One Job?: No Work Stressors: "none Patient's Job has Been Impacted by Current Illness: No What is the Longest Time Patient has Held a Job?: 15 yrs Where was the Patient Employed at that Time?: current job Has Patient ever Been in the [employer]?: No  Financial Resources:   Financial resources: Income from employment Does patient have a representative payee or guardian?: No  Alcohol/Substance Abuse:   What has been your use of drugs/alcohol within the last 12 months?: " none" If attempted suicide, did drugs/alcohol play a role in this?: No Alcohol/Substance Abuse Treatment Hx: Denies past history If yes, describe treatment: na Has alcohol/substance abuse ever caused legal problems?: No  Social Support System:   Patient's Community Support System: Good Describe Community Support System: Pharmacologist at [employer]" Type of faith/religion: Christian How does patient's faith help to cope with current illness?: " I pray"  Leisure/Recreation:   Do You Have Hobbies?: Yes Leisure and Hobbies: watch Youtube  Strengths/Needs:   What is the  patient's perception of their  strengths?: " I am fair,nice,perfectionist" Patient states they can use these personal strengths during their treatment to contribute to their recovery: " I don't know maybe I can find it out while here" Patient states these barriers may affect/interfere with their treatment: " no barriers" Patient states these barriers may affect their return to the community: " no barriers" Other important information patient would like considered in planning for their treatment: na  Discharge Plan:   Currently receiving community mental health services: Yes (From Whom) ([facility]) Patient states concerns and preferences for aftercare planning are: " I want to continue to Dr. Clovis Pu at [facility]" Patient states they will know when they are safe and ready for discharge when: " I want to get verification that I did not do what I think I did" Does patient have access to transportation?: Yes (" I have friends and brother") Does patient have financial barriers related to discharge medications?: No (active coverage) Patient description of barriers related to discharge medications: " no barriers' Will patient be returning to same living situation after discharge?: Yes  Summary/Recommendations:   Summary and Recommendations (to be completed by the evaluator): Zachary [name] "[name]" is a 61 y.o. male admitted to [facility] from [facility] due to suicidal ideations and overdosed on Luvox. Pt reported that he does not know if he was in realty or false perception but believes he did something "bad/wrong" CSW inquired what was the "bad/wrong" thing pt would not share information. Pt wanted to know if the police would be involved if he would share information. This is pt's first psychiatric.  Pt has a history of major depressive disorder. Pt reported stressors as the terrible thing that he may or not have done. Pt reports no use of alcohol or drugs. Pt lives in alone in a two bedroom apartment in [place], [place]. Pt is followed by [facility] for  medication management. Patient will benefit from crisis stabilization, medication evaluation, group therapy and psychoeducation, in addition to case management for discharge planning. At discharge it is recommended that Patient adhere to the established discharge plan and continue in treatment.  Jimi Giza R. 09/01/2021

## 2021-09-01 NOTE — Group Note (Signed)
Recreation Therapy Group Note   Group Topic:Team Building  Group Date: 09/01/2021 Start Time: 0935 End Time: 1010 Facilitators: Victorino Sparrow, LRT,CTRS Location: [address] Dayroom   Goal Area(s) Addresses:  Patient will effectively work with peer towards shared goal.  Patient will identify skills used to make activity successful.  Patient will identify how skills used during activity can be used to reach post d/c goals.   Group Description: Landing Pad. In teams of 3-5, patients were given 12 plastic drinking straws and an equal length of masking tape. Using the materials provided, patients were asked to build a landing pad to catch a golf ball dropped from approximately 5 feet in the air. All materials were required to be used by the team in their design. LRT facilitated post-activity discussion.   Affect/Mood: Appropriate   Participation Level: Active   Participation Quality: Independent   Behavior: Appropriate   Speech/Thought Process: Focused   Insight: Good   Judgement: Good   Modes of Intervention: STEM Activity   Patient Response to Interventions:  Attentive   Education Outcome:  Acknowledges education and In group clarification offered    Clinical Observations/Individualized Feedback: Pt attended and participated in group activity.    Plan: Continue to engage patient in RT group sessions 2-3x/week.   Victorino Sparrow, LRT,CTRS 09/01/2021 11:47 AM

## 2021-09-01 NOTE — ED Provider Notes (Signed)
[facility] DEPT Provider Note   CSN: [id number] Arrival date & time: 09/01/21  1921     History Chief Complaint  Patient presents with   Irregular Heart Beat    HPI Zachary [name] is a 61 y.o. male presenting for abnormal lab.  Patient was at a behavioral health hospital tonight when they noticed that his EKG from admission had nonspecific abnormalities.  Patient was sent over to the emergency department for medical clearance.  He denies fevers or chills nausea or vomiting, syncope shortness of breath.  He has had no symptoms since he is been in the facility for 4 days.  Patient otherwise healthy.  He does have history of hypertension, no history of CAD or family history of CAD.   Patient's recorded medical, surgical, social, medication list and allergies were reviewed in the Snapshot window as part of the initial history.   Review of Systems   Review of Systems  Constitutional:  Negative for chills and fever.  HENT:  Negative for ear pain and sore throat.   Eyes:  Negative for pain and visual disturbance.  Respiratory:  Negative for cough and shortness of breath.   Cardiovascular:  Negative for chest pain and palpitations.  Gastrointestinal:  Negative for abdominal pain and vomiting.  Genitourinary:  Negative for dysuria and hematuria.  Musculoskeletal:  Negative for arthralgias and back pain.  Skin:  Negative for color change and rash.  Neurological:  Negative for seizures and syncope.  All other systems reviewed and are negative.   Physical Exam Updated Vital Signs BP (!) 173/103   Pulse 73   Temp 98 F (36.7 C) (Oral)   Resp 16   Ht 6' 3"  (1.905 m)   Wt 107.5 kg   SpO2 98%   BMI 29.62 kg/m  Physical Exam Vitals and nursing note reviewed.  Constitutional:      General: He is not in acute distress.    Appearance: He is well-developed.  HENT:     Head: Normocephalic and atraumatic.     Nose: No congestion or rhinorrhea.      Mouth/Throat:     Mouth: Mucous membranes are moist.     Pharynx: Oropharynx is clear. No oropharyngeal exudate.  Eyes:     Conjunctiva/sclera: Conjunctivae normal.     Pupils: Pupils are equal, round, and reactive to light.  Cardiovascular:     Rate and Rhythm: Normal rate and regular rhythm.     Heart sounds: No murmur heard. Pulmonary:     Effort: Pulmonary effort is normal. No respiratory distress.     Breath sounds: Normal breath sounds.  Abdominal:     Palpations: Abdomen is soft.     Tenderness: There is no abdominal tenderness.  Musculoskeletal:        General: No swelling, tenderness, deformity or signs of injury. Normal range of motion.     Cervical back: Neck supple. No rigidity or tenderness.  Skin:    General: Skin is warm and dry.     Capillary Refill: Capillary refill takes less than 2 seconds.  Neurological:     General: No focal deficit present.     Mental Status: He is alert and oriented to person, place, and time. Mental status is at baseline.     Cranial Nerves: No cranial nerve deficit.     Motor: No weakness.  Psychiatric:        Mood and Affect: Mood normal.      ED Course/ Medical  Decision Making/ A&P    Procedures Procedures   Medications Ordered in ED Medications  acetaminophen (TYLENOL) tablet 650 mg (has no administration in time range)    Or  acetaminophen (TYLENOL) suppository 650 mg (has no administration in time range)  aspirin EC tablet 81 mg (81 mg Oral Given 09/01/21 0819)  budesonide (ENTOCORT EC) 24 hr capsule 9 mg (9 mg Oral Given 09/01/21 0855)  busPIRone (BUSPAR) tablet 30 mg (30 mg Oral Given 09/01/21 2032)  lamoTRIgine (LAMICTAL) tablet 200 mg (200 mg Oral Given 09/01/21 0817)  levothyroxine (SYNTHROID) tablet 75 mcg (75 mcg Oral Given 09/01/21 0620)  loperamide (IMODIUM) capsule 2 mg (has no administration in time range)  LORazepam (ATIVAN) tablet 0.5 mg (has no administration in time range)  losartan (COZAAR) tablet 100 mg (100 mg  Oral Given 09/01/21 0818)  mesalamine (PENTASA) CR capsule 1,500 mg (1,500 mg Oral Given 09/01/21 0856)  OLANZapine (ZYPREXA) tablet 15 mg (15 mg Oral Given 09/01/21 2257)  pantoprazole (PROTONIX) EC tablet 40 mg (has no administration in time range)  alum & mag hydroxide-simeth (MAALOX/MYLANTA) 200-200-20 MG/5ML suspension 30 mL (has no administration in time range)  magnesium hydroxide (MILK OF MAGNESIA) suspension 30 mL (has no administration in time range)  feeding supplement (ENSURE ENLIVE / ENSURE PLUS) liquid 237 mL (237 mLs Oral Patient Refused/Not Given 09/01/21 1725)  traZODone (DESYREL) tablet 50 mg (50 mg Oral Given 08/31/21 2112)  lipase/protease/amylase (CREON) capsule 144,000 Units (144,000 Units Oral Given 09/01/21 1725)  lipase/protease/amylase (CREON) capsule 72,000 Units (72,000 Units Oral Given 09/01/21 2255)  donepezil (ARICEPT) tablet 10 mg (10 mg Oral Not Given 09/01/21 2304)  fluvoxaMINE (LUVOX) tablet 100 mg (100 mg Oral Not Given 09/01/21 2304)  nicotine polacrilex (NICORETTE) gum 2 mg (has no administration in time range)    Medical Decision Making:    Zachary [name] is a 61 y.o. male who presented to the ED today with abnormal EKG detailed above.     Patient placed on continuous vitals and telemetry monitoring while in ED which was reviewed periodically.   Complete initial physical exam performed, notably the patient  was hemodynamically stable in no acute distress.      Reviewed and confirmed nursing documentation for past medical history, family history, social history.    Initial Assessment:   Patient has low risk for ACS based on his overall risk factors.  He does not smoke he does have hypertension and he does have a slightly elevated BMI to less than 30.  He is 61. EKG here reviewed without evidence of acute ischemia.  Troponin performed without evidence of subacute ischemia. Patient otherwise ambulatory tolerating p.o. intake in no acute distress.  Ultimately  uncertain etiology of patient's symptoms though patient remained stable for close outpatient care and management.  Patient going back to safe environment with psychiatric team.  Clinical Impression:  1. Palpitations      Discharge   Final Clinical Impression(s) / ED Diagnoses Final diagnoses:  Palpitations    Rx / DC Orders ED Discharge Orders     None         Tretha Sciara, MD 09/02/21 0001

## 2021-09-01 NOTE — Progress Notes (Signed)
Per physician order EKG performed stat. Physician reviewed, ordered send to ER for evaluation of cardiac status. Report called to charge nurse at [facility] long.

## 2021-09-01 NOTE — Group Note (Signed)
Date:  09/01/2021 Time:  9:55 AM  Group Topic/Focus:  Orientation:   The focus of this group is to educate the patient on the purpose and policies of crisis stabilization and provide a format to answer questions about their admission.  The group details unit policies and expectations of patients while admitted.    Participation Level:  Active  Participation Quality:  Appropriate  Affect:  Appropriate  Cognitive:  Appropriate  Insight: Appropriate  Engagement in Group:  Engaged  Modes of Intervention:  Discussion  Additional Comments:    Jerrye Beavers 09/01/2021, 9:55 AM

## 2021-09-01 NOTE — Significant Event (Signed)
In review of records, patient had ECG completed on 08/29/2021 which showed new cardiac changes with ST depression.  Patient denies chest pain.  Patient states that he had a 30-day cardiac monitor that he was told was normal.  Twelve-lead ECG performed on unit.  Patient denies symptoms of chest pain or heart palpitations, however he does continue to show ECG changes with nonspecific T wave abnormality in the inferior lateral leads.  We will plan to send patient to ED for cardiac evaluation.  Lavella Hammock, MD

## 2021-09-01 NOTE — ED Notes (Signed)
Called safe trans for xport.

## 2021-09-01 NOTE — ED Triage Notes (Addendum)
Patient sent over from [facility] due to an irregular heart beat. He was evaluated last month for his heart. He said they found nothing wrong. [facility] did an EKG and they said "there was a dip in the pattern" therefore they wanted him to be sent over here for more tests to clear him. Patient is not short of breath, no chest pain, said he does not feel anything. They have a room for him held once he is cleared.

## 2021-09-01 NOTE — ED Notes (Signed)
Pt and sitter left w/ safe trans en route [facility].

## 2021-09-01 NOTE — Progress Notes (Signed)
   09/01/21 0500  Sleep  Number of Hours 7.5

## 2021-09-02 LAB — HEMOGLOBIN A1C
Hgb A1c MFr Bld: 5.7 % — ABNORMAL HIGH (ref 4.8–5.6)
Mean Plasma Glucose: 116.89 mg/dL

## 2021-09-02 LAB — BASIC METABOLIC PANEL
Anion gap: 8 (ref 5–15)
BUN: 10 mg/dL (ref 6–20)
CO2: 29 mmol/L (ref 22–32)
Calcium: 8.9 mg/dL (ref 8.9–10.3)
Chloride: 107 mmol/L (ref 98–111)
Creatinine, Ser: 1.25 mg/dL — ABNORMAL HIGH (ref 0.61–1.24)
GFR, Estimated: >60 mL/min (ref >60)
Glucose, Bld: 116 mg/dL — ABNORMAL HIGH (ref 70–99)
Potassium: 3.3 mmol/L — ABNORMAL LOW (ref 3.5–5.1)
Sodium: 144 mmol/L (ref 135–145)

## 2021-09-02 MED ORDER — PANCRELIPASE (LIP-PROT-AMYL) 36000-114000 UNITS PO CPEP
144000.0000 [IU] | ORAL_CAPSULE | Freq: Three times a day (TID) | ORAL | Status: DC
  Administered 2021-09-02 – 2021-09-06 (×12): 144000 [IU] via ORAL

## 2021-09-02 MED ORDER — POTASSIUM CHLORIDE CRYS ER 20 MEQ PO TBCR
40.0000 meq | EXTENDED_RELEASE_TABLET | Freq: Once | ORAL | Status: AC
  Administered 2021-09-02: 40 meq via ORAL
  Filled 2021-09-02: qty 2

## 2021-09-02 MED ORDER — PANCRELIPASE (LIP-PROT-AMYL) 36000-114000 UNITS PO CPEP
72000.0000 [IU] | ORAL_CAPSULE | ORAL | Status: DC | PRN
  Administered 2021-09-02 – 2021-09-05 (×5): 72000 [IU] via ORAL

## 2021-09-02 NOTE — Progress Notes (Signed)
Patient denies SI, HI, and AVH. Patient reports tremors that he has not previously had. Patient engaged Probation officer in therapeutic conversation, attended groups and was compliant with medication.   Assess patient for safety, offer medications as prescribed, engage patient in 1:1 staff talks.   Continue to monitor as planned. Patient able to contract for safety.

## 2021-09-02 NOTE — Group Note (Signed)
LCSW Group Therapy Note  09/02/2021   10:00-11:00am   Type of Therapy and Topic:  Group Therapy: Anger Cues and Responses  Participation Level:  Active   Description of Group:   In this group, patients learned how to recognize the physical, cognitive, emotional, and behavioral responses they have to anger-provoking situations.  They identified a recent time they became angry and how they reacted.  They analyzed how their reaction was possibly beneficial and how it was possibly unhelpful.  The group discussed a variety of healthier coping skills that could help with such a situation in the future.  They also learned that anger is a second emotion fueled by other feelings and explored their own emotions that may frequently fuel their anger.  Focus was placed on how helpful it is to recognize the underlying emotions to our anger, because working on those can lead to a more permanent solution as well as our ability to focus on the important rather than the urgent.  Therapeutic Goals: Patients will remember their last incident of anger and how they felt emotionally and physically, what their thoughts were at the time, and how they behaved. Patients will identify how their behavior at that time worked for them, as well as how it worked against them. Patients will explore possible new behaviors to use in future anger situations. Patients will learn that anger itself is normal and cannot be eliminated, and that healthier reactions can assist with resolving conflict rather than worsening situations. Patients will learn that anger is a secondary emotion and worked to identify some of the underlying feelings that may lead to anger.  Summary of Patient Progress:  The patient shared that a recent source of anger was feeling his social worker broke his confidence, which made him feel disrespected and uncomfortable with being here.  He said he is non-confrontational and tends to "just forget it" but he also  acknowledged that the resentment does not just go away.  He stated after group that he could have used this information 2 weeks ago because he had a blow-up that would have been prevented if he had known then what was presented in this group.  Therapeutic Modalities:   Cognitive Behavioral Therapy  Maretta Los

## 2021-09-02 NOTE — Progress Notes (Signed)
   09/02/21 2150  Psych Admission Type (Psych Patients Only)  Admission Status Involuntary  Psychosocial Assessment  Patient Complaints Anxiety  Eye Contact Fair  Facial Expression Animated  Affect Appropriate to circumstance  Speech Logical/coherent  Interaction Assertive  Motor Activity Restless  Appearance/Hygiene Unremarkable  Behavior Characteristics Appropriate to situation  Mood Anxious;Pleasant  Thought Process  Coherency WDL  Content WDL  Delusions None reported or observed  Perception WDL  Hallucination None reported or observed  Judgment Poor  Confusion None  Danger to Self  Current suicidal ideation? Denies  Danger to Others  Danger to Others None reported or observed

## 2021-09-02 NOTE — Progress Notes (Signed)
[facility] Group Notes:  (Nursing/MHT/Case Management/Adjunct)  Date:  09/02/2021  Time:  2000  Type of Therapy:   wrap up group  Participation Level:  Active  Participation Quality:  Appropriate, Attentive, Sharing, and Supportive  Affect:  Appropriate  Cognitive:  Alert  Insight:  Improving  Engagement in Group:  Engaged  Modes of Intervention:  Clarification, Education, and Support  Summary of Progress/Problems: Positive thinking and positive change were discussed.   Shellia Cleverly 09/02/2021, 8:40 PM

## 2021-09-02 NOTE — [facility] Group Notes (Signed)
.  Psychoeducational Group Note    Date:  09/02/2021 Time: 1300-1400    Purpose of Group: . The group focus' on teaching patients on how to identify their needs and their Life Skills:  A group where two lists are made. What people need and what are things that we do that are unhealthy. The lists are developed by the patients and it is explained that we often do the actions that are not healthy to get our list of needs met.  Goal:: to develop the coping skills needed to get their needs met  Participation Level:  Active  Participation Quality:  Appropriate  Affect:  Appropriate  Cognitive:  Oriented  Insight: Improving  Engagement in Group:  Engaged  Modes of Intervention:  Activity, Discussion, Education, and Support  Additional Comments:  Rates his energy at a 7/10. Participated fully in the group.  Paulino Rily

## 2021-09-02 NOTE — Progress Notes (Addendum)
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  Of note, on chart review, patient reported suicidal ideation on 07/03/2021, which believed may have been due to discontinuation of budesonide.  Yesterday's recommendation per psychiatry team: -Restarted Luvox 100 mg po Q bedtime for depression/OCD.  Taper up as needed -Restarted Buspar 30 mg po bid for anxiety. -Restarted Trazodone 50 mg po Q bedtime prn for insomnia -Restarted Lamictal 200 mg daily for mood stabilization. -Restarted Olanzapine 15 mg po Q bedtime for mood control.  -Restarted Nicorette gum 2 mg prn for nicotine withdrawal.  -Restarted Aricept 10 mg hs for dementia  On evaluation today: Patient is seen at the bedside, and reports immediately that he feels uncomfortable discussing the circumstances as to which brought him in because he feels betrayed by the social worker with whom he worked who discussed his case with his brother.  In brief, he does acknowledge that he "misinterpreted someone else's actions then developed guilt and spiraled until it came to a head."  He reports that this morning, his mood is "not good" and that he had already made peace with the situation, but the social worker calls that confidence to now be cut in half.  However, he did note that the group this morning was beneficial for him and that he now knows that he may have jump to conclusions, there may have been other possibilities other than attempting to end his life, and he learned how to better control his emotions.  He realized that he was about to complete suicide over something that may or may not have happened.  He ruminates on the betrayal,  mentioning it multiple times throughout the assessment.  He reports that he previously had been preoccupied with "farting," obsessing even when not doing it, but denies compulsive behaviors.  In discussing medications, we informed him that the FDA approved maximal dose of Luvox is 300 mg, so he will not return to taking 400 mg during this admission, to which he responds "I am never going to do this again not because I am better but because I know I will not die from it."  He otherwise denies SI, HI, AVH, paranoia, and other psychotic symptoms including thought insertion/withdrawal, thought broadcasting, and special messages.  He does have the somatic complaint of tremor, that is only present in his left hand when outstretched but not present at rest.  He is advised that it is unlikely affect of his body detoxing from the Luvox.  He denies further acute concerns or complaints today.  Chart review: outpatient note per primary psychiatrist, Dr. Clovis Pu- July 2023 visit: Zachary [name] has been diagnosed with treatment resistant depression with psychotic features, OCD and social anxiety but schizoaffective may be more appropriate diagnosis given this chronic paranoid thinking.  Specifically he is chronically  suspicious that he has dementia despite neuropsychological testing which tells him he does not.  He also has chronic thoughts that other people find his odor offensive because of passing gas.  There is very little evidence to support this.  He has been under my psychiatric care for many many years and never owed noticed body odor and appointments at any time.  He also suspects that people  are thinking negatively of him regularly.  Overall his paranoia and anxiety and depression are unchanged with reduction in olanzapine to 67m daily months ago .  Cognitve complaints could be related to untreated OSA. Pending sleep study results. Hoarding worsened since Luvox reduced to 300 mg daily so increased to 400 mg daily in 2022.  Buspirone 30 mg twice daily, Aricept 10 mg daily, fluvoxamine 400 mg daily, lamotrigine 200 mg daily, lithium 150 nightly, olanzapine 15 mg nightly. OK prn lorazepam usually less sedating than X anax 0.5-1.0 mg BID prn  Past psych med: Abilify, Zyprexa, Seroquel 600, olanzapine 30 Wellbutrin, nortriptyline, clomipramine, mirtazapine,  fluvoxamine 400, Lamotrigine, lithium no response,  stimulants, Aricept  buspirone 60,   Xanax Naltrexone for compulsive buying NR  Principal Problem: MDD (major depressive disorder), recurrent episode, severe (HCC) Diagnosis: Principal Problem:   MDD (major depressive disorder), recurrent episode, severe ([facility] Active Problems:   Suicide attempt by drug overdose ([place]   Total Time spent with patient: 30 minutes  Past Psychiatric History: See H&P  Past Medical History:  Past Medical History:  Diagnosis Date   Anxiety    [place] disease [place]    pt reports subsequent testing was normal   Depression    GERD (gastroesophageal reflux disease)    Herpes simplex    Hoarding disorder    Hyperlipidemia    Hypertension    IBS (irritable bowel syndrome)    Incontinence     Past Surgical History:  Procedure Laterality Date   APPENDECTOMY     COLONOSCOPY     POLYPECTOMY     TWISTED TESTICLES  1970   [place]    UMBILICAL HERNIA REPAIR     Family History:  Family History  Problem Relation Age of Onset   Arthritis Mother    Stroke Mother    Depression Mother    Hypertension Father    Prostate cancer Father 655  Breast cancer Maternal Grandmother    Cancer Maternal Grandfather        unknown type   Cancer Paternal Grandmother        unknown type   Lung cancer Maternal Aunt    Prostate cancer Paternal Uncle    Prostate cancer Paternal Uncle    Colon cancer Neg Hx    Rectal cancer Neg Hx    Stomach cancer Neg Hx    Esophageal cancer Neg Hx    Pancreatic cancer Neg Hx    Family Psychiatric  History: See H&P Social History:  Social  History   Substance and Sexual Activity  Alcohol Use No     Social History   Substance and Sexual Activity  Drug Use No    Social History   Socioeconomic History   Marital status: Single    Spouse name: Not on file   Number of children: Not on file   Years of education: Not on file   Highest education level: Not on file  Occupational History   Not on file  Tobacco Use   Smoking status: Former    Types: Cigarettes    Quit date: 1980    Years since quitting: 43.6   Smokeless tobacco: Never  Vaping Use   Vaping Use: Never used  Substance and Sexual Activity   Alcohol use: No   Drug use: No   Sexual activity: Not Currently  Other Topics Concern   Not on file  Social History Narrative   Lives alone.     Social Determinants of Health  Financial Resource Strain: Not on file  Food Insecurity: Not on file  Transportation Needs: Not on file  Physical Activity: Not on file  Stress: Not on file  Social Connections: Not on file   Sleep: Fair  Appetite:  Fair  Current Medications: Current Facility-Administered Medications  Medication Dose Route Frequency Provider Last Rate Last Admin   acetaminophen (TYLENOL) tablet 650 mg  650 mg Oral Q6H PRN Suella Broad, FNP       Or   acetaminophen (TYLENOL) suppository 650 mg  650 mg Rectal Q6H PRN Starkes-Perry, Gayland Curry, FNP       alum & mag hydroxide-simeth (MAALOX/MYLANTA) 200-200-20 MG/5ML suspension 30 mL  30 mL Oral Q4H PRN Starkes-Perry, Gayland Curry, FNP       aspirin EC tablet 81 mg  81 mg Oral Daily Suella Broad, FNP   81 mg at 09/02/21 0840   busPIRone (BUSPAR) tablet 30 mg  30 mg Oral BID Suella Broad, FNP   30 mg at 09/02/21 0840   donepezil (ARICEPT) tablet 10 mg  10 mg Oral QHS Lavella Hammock, MD       feeding supplement (ENSURE ENLIVE / ENSURE PLUS) liquid 237 mL  237 mL Oral BID BM Nelda Marseille, Shylin Keizer E, MD   237 mL at 09/01/21 1017   fluvoxaMINE (LUVOX) tablet 100 mg  100 mg Oral QHS Lavella Hammock, MD       lamoTRIgine (LAMICTAL) tablet 200 mg  200 mg Oral Daily Suella Broad, FNP   200 mg at 09/02/21 0840   levothyroxine (SYNTHROID) tablet 75 mcg  75 mcg Oral Q0600 Suella Broad, FNP   75 mcg at 09/02/21 0622   lipase/protease/amylase (CREON) capsule 144,000 Units  144,000 Units Oral TID WC Harlow Asa, MD   144,000 Units at 09/02/21 1207   lipase/protease/amylase (CREON) capsule 72,000 Units  72,000 Units Oral PRN Harlow Asa, MD   72,000 Units at 09/02/21 1425   loperamide (IMODIUM) capsule 2 mg  2 mg Oral Q4H PRN Starkes-Perry, Gayland Curry, FNP       LORazepam (ATIVAN) tablet 0.5 mg  0.5 mg Oral BID PRN Suella Broad, FNP       losartan (COZAAR) tablet 100 mg  100 mg Oral Daily Suella Broad, FNP   100 mg at 09/02/21 0840   magnesium hydroxide (MILK OF MAGNESIA) suspension 30 mL  30 mL Oral Daily PRN Suella Broad, FNP       mesalamine (PENTASA) CR capsule 1,500 mg  1,500 mg Oral Daily Suella Broad, FNP   1,500 mg at 09/02/21 0840   nicotine polacrilex (NICORETTE) gum 2 mg  2 mg Oral PRN Lavella Hammock, MD       OLANZapine (ZYPREXA) tablet 15 mg  15 mg Oral QHS Suella Broad, FNP   15 mg at 09/01/21 2257   pantoprazole (PROTONIX) EC tablet 40 mg  40 mg Oral Daily Suella Broad, FNP   40 mg at 09/02/21 1208   traZODone (DESYREL) tablet 50 mg  50 mg Oral QHS PRN Evette Georges, NP   50 mg at 08/31/21 2112    Lab Results:  Results for orders placed or performed during the hospital encounter of 08/31/21 (from the past 48 hour(s))  Urinalysis, Routine w reflex microscopic Urine, Clean Catch     Status: Abnormal   Collection Time: 09/01/21  5:30 PM  Result Value Ref Range   Color, Urine COLORLESS (A)  YELLOW   APPearance CLEAR CLEAR   Specific Gravity, Urine 1.002 (L) 1.005 - 1.030   pH 6.0 5.0 - 8.0   Glucose, UA NEGATIVE NEGATIVE mg/dL   Hgb urine dipstick SMALL (A) NEGATIVE   Bilirubin Urine  NEGATIVE NEGATIVE   Ketones, ur NEGATIVE NEGATIVE mg/dL   Protein, ur NEGATIVE NEGATIVE mg/dL   Nitrite NEGATIVE NEGATIVE   Leukocytes,Ua NEGATIVE NEGATIVE   Bacteria, UA NONE SEEN NONE SEEN    Comment: Performed at [facility] [address], [place], [place] [address]  Troponin I (High Sensitivity)     Status: None   Collection Time: 09/01/21  8:44 PM  Result Value Ref Range   Troponin I (High Sensitivity) 4 <18 ng/L    Comment: (NOTE) Elevated high sensitivity troponin I (hsTnI) values and significant  changes across serial measurements may suggest ACS but many other  chronic and acute conditions are known to elevate hsTnI results.  Refer to the "Links" section for chest pain algorithms and additional  guidance. Performed at [facility], [place] [address], [place], [place] [address]   CBC with Differential     Status: None   Collection Time: 09/01/21  8:44 PM  Result Value Ref Range   WBC 10.5 4.0 - 10.5 K/uL   RBC 4.33 4.22 - 5.81 MIL/uL   Hemoglobin 13.7 13.0 - 17.0 g/dL   HCT 41.7 39.0 - 52.0 %   MCV 96.3 80.0 - 100.0 fL   MCH 31.6 26.0 - 34.0 pg   MCHC 32.9 30.0 - 36.0 g/dL   RDW 12.8 11.5 - 15.5 %   Platelets 349 150 - 400 K/uL   nRBC 0.0 0.0 - 0.2 %   Neutrophils Relative % 68 %   Neutro Abs 7.3 1.7 - 7.7 K/uL   Lymphocytes Relative 20 %   Lymphs Abs 2.1 0.7 - 4.0 K/uL   Monocytes Relative 9 %   Monocytes Absolute 0.9 0.1 - 1.0 K/uL   Eosinophils Relative 2 %   Eosinophils Absolute 0.2 0.0 - 0.5 K/uL   Basophils Relative 1 %   Basophils Absolute 0.1 0.0 - 0.1 K/uL   Immature Granulocytes 0 %   Abs Immature Granulocytes 0.02 0.00 - 0.07 K/uL    Comment: Performed at [facility], [place] [address], [place], [place] [address]  Basic metabolic panel     Status: Abnormal   Collection Time: 09/01/21  8:44 PM  Result Value Ref Range   Sodium 140 135 - 145 mmol/L   Potassium 3.9 3.5 - 5.1 mmol/L   Chloride 103  98 - 111 mmol/L   CO2 29 22 - 32 mmol/L   Glucose, Bld 102 (H) 70 - 99 mg/dL    Comment: Glucose reference range applies only to samples taken after fasting for at least 8 hours.   BUN 11 6 - 20 mg/dL   Creatinine, Ser 1.33 (H) 0.61 - 1.24 mg/dL   Calcium 8.7 (L) 8.9 - 10.3 mg/dL   GFR, Estimated >60 >60 mL/min    Comment: (NOTE) Calculated using the CKD-EPI Creatinine Equation (2021)    Anion gap 8 5 - 15    Comment: Performed at [facility], [place] [address], [place], [place] [address]  Magnesium     Status: None   Collection Time: 09/01/21  8:44 PM  Result Value Ref Range   Magnesium 2.2 1.7 - 2.4 mg/dL    Comment: Performed at [facility], [address] [place], [place] [address]  Hemoglobin A1c     Status: Abnormal   Collection Time: 09/02/21  6:46 AM  Result Value Ref Range   Hgb A1c MFr Bld 5.7 (H) 4.8 - 5.6 %    Comment: (NOTE) Pre diabetes:          5.7%-6.4%  Diabetes:              >6.4%  Glycemic control for   <7.0% adults with diabetes    Mean Plasma Glucose 116.89 mg/dL    Comment: Performed at [facility] [address], [place], [place] [address]  Basic metabolic panel     Status: Abnormal   Collection Time: 09/02/21  6:46 AM  Result Value Ref Range   Sodium 144 135 - 145 mmol/L   Potassium 3.3 (L) 3.5 - 5.1 mmol/L   Chloride 107 98 - 111 mmol/L   CO2 29 22 - 32 mmol/L   Glucose, Bld 116 (H) 70 - 99 mg/dL    Comment: Glucose reference range applies only to samples taken after fasting for at least 8 hours.   BUN 10 6 - 20 mg/dL   Creatinine, Ser 1.25 (H) 0.61 - 1.24 mg/dL   Calcium 8.9 8.9 - 10.3 mg/dL   GFR, Estimated >60 >60 mL/min    Comment: (NOTE) Calculated using the CKD-EPI Creatinine Equation (2021)    Anion gap 8 5 - 15    Comment: Performed at [facility], [place] [address] [place],  [address]    Blood Alcohol level:  Lab Results  Component Value Date   ETH <10  21/30/8657    Metabolic Disorder Labs: Lab Results  Component Value Date   HGBA1C 5.7 (H) 09/02/2021   MPG 116.89 09/02/2021   No results found for: "PROLACTIN" Lab Results  Component Value Date   CHOL 186 07/16/2018   TRIG 161 (H) 07/16/2018   HDL 53 07/16/2018   CHOLHDL 3.5 07/16/2018   VLDL 56 (H) 07/30/2013   LDLCALC 101 (H) 07/16/2018   [place] 95 07/30/2013    Physical Findings:  Musculoskeletal: Strength & Muscle Tone: within normal limits Gait & Station: normal Patient leans: N/A  Psychiatric Specialty Exam:  Presentation  General Appearance: appears older than stated age, fair hygiene, casually dressed   Eye Contact:Good   Speech:Clear and Coherent; Normal Rate   Speech Volume:Normal   Handedness:Right    Mood and Affect  Mood: described as feeling betrayed - appears anxious  Affect:Labile    Thought Process  Thought Processes:evasive at times, circumstantial, loses train of thought at times   Descriptions of Associations:Intact   Orientation:Full (Time, Place and Person)   Thought Content:Rumination; Perseveration (Perseverating and ruminative about Education officer, museum betraying him and not trusting our staff with information) Denies SI, HI, AVH, ideas of reference or first rank symptoms  History of Schizophrenia/Schizoaffective disorder:No  denies  Duration of Psychotic Symptoms:N/A  denies  Hallucinations:Hallucinations: None  Ideas of Reference:None   Suicidal Thoughts:Suicidal Thoughts: No  Homicidal Thoughts:Homicidal Thoughts: No   Sensorium  Memory: Fair   Judgment:Fair   Insight:Shallow; Lacking    Executive Functions  Concentration:Fair   Attention Span:Fair   [place] of Knowledge:Good   Language:Good    Psychomotor Activity  Psychomotor Activity:Psychomotor Activity: Tremor (Only on outstretched left hand)   Assets  Assets:Communication Skills; Desire for Improvement; Financial  Resources/Insurance; Housing; Social Support    Sleep  Total time unrecorded   Physical Exam  Vitals reviewed.  Constitutional:      General: He is not in acute distress. HENT:     Head: Normocephalic and atraumatic.     Mouth/Throat:     Mouth: Mucous membranes are moist.     Pharynx: Oropharynx is clear.  Skin:    Comments: Dried blood around multiple nailbeds; patient observed picking at nailbeds throughout assessment  Neurological:     General: No focal deficit present.     Mental Status: He is alert.     Gait: Gait normal.     Comments: Fine tremor of outstretched left hand noted, but not present at rest    Review of Systems  Constitutional:  Negative for malaise/fatigue.  Respiratory:  Negative for shortness of breath.   Cardiovascular:  Negative for chest pain and palpitations.  Gastrointestinal: Negative.   Genitourinary: Negative.   Neurological:  Positive for tremors. Negative for dizziness and headaches.   Blood pressure (!) 138/97, pulse 92, temperature (!) 97.4 F (36.3 C), temperature source Oral, resp. rate 16, height 6' 3"  (1.905 m), weight 107.5 kg, SpO2 99 %. Body mass index is 29.62 kg/m.   Treatment Plan Summary: ASSESSMENT: Principal Problem: Major depressive disorder, recurrent, severe currently without psychotic features Mixed obsessional thoughts and acts by hx (r/o OCD) Social anxiety d/o by hx Hoarding d/o by hx Nicotine use disorder Suicide attempt by drug overdose Memory loss (mild cognitive impairment by hx) Hypothyroidism Hypertension GERD History of Crohn's disease  Pancreatic insufficiency H/o hyponatremia  PLAN: Safety and Monitoring:             -- Involuntary admission to inpatient psychiatric unit for safety, stabilization and treatment             -- Daily contact with patient to assess and evaluate symptoms and progress in treatment             -- Patient's case to be discussed in multi-disciplinary team meeting              -- Observation Level : q15 minute checks             -- Vital signs:  q12 hours             -- Precautions: suicide, elopement, and assault   2. Psychiatric Diagnoses and Treatment:  #Major depressive disorder-recurrent, severe (currently without psychotic features) #Mixed obsessional thoughts and acts by hx (r/o OCD) #Social anxiety d/o by hx #Hoarding d/o by hx -Continue home BuSpar 30 mg twice daily - Restart home Luvox at 100 mg nightly.  Discussed with patient FDA approved maximal dose is 300 mg, and we will not go beyond that this admission, to which she voiced understanding - restarting slow titration given recent OD - Continue home Lamictal 200 mg daily (verifies he has not missed doses and Lamictal level 2.2 on admission) - Holding Lithium - level less than 0.06 on admission and give OD history will not restart - unclear if he was taking prior to admission - Continue home Zyprexa 15 mg nightly A1c 5.7; Lipids pending; QTC 450m  #Memory loss (mild cognitive impairment by hx) -Continue home Aricept 10 mg nightly  #Nicotine use disorder - Continue Nicorette 2 mg as needed   3. Medical Diagnoses and Treatment: #Hypothyroidism -Continue home Synthroid 75 mcg daily  # Hypertension -Continue home losartan 100 mg daily  # GERD -Continue home Protonix 40 mg daily  # History of Crohn's disease c/b fecal incontinence -Continue home mesalamine 1500 mg  daily -Discontinue budesonide 9 mg, as PCP note from 7/17 reported discontinuation as fecal incontinence seemed to worsen on this medication.  # Pancreatic insufficiency -Continue home Creon 144,000 units 3 times daily with meals  #CVA prophylaxis 2/2 history of arrhythmia - Continue home enteric-coated aspirin 81 mg daily  #Elevated creatinine 1.25 (down from 1.51 4 days ago) - Encouraged po hydration and will repeat BMP for trending  #Elevated WBC - resolved - Repeat CBC shows WBC 10.5 on 9/1  #Nonspecific EKG  changes/Incomplete RBBB - Sent to ED and cleared from cardiac standpoint on 9/1 (troponin I 4) - f/u with outpatient PCP after discharge  #Hypokalemia - Received Klor 61mq today X1 - repeating BMP tomorrow  #h/o hyponatremia - current Na+ 144 - holding Na+ tab at this time  #small blood in urine - currently asymptomatic - will need PCP f/u after discharge   4. Discharge Planning:              -- Social work and case management to assist with discharge planning and identification of hospital follow-up needs prior to discharge             -- Estimated LOS 5 to 7 days             -- Discharge Concerns: Need to establish a safety plan             -- Discharge Goals: Return home with outpatient referrals for mental health follow-up including medication management/psychotherapy    CRosezetta Schlatter MD 09/02/2021, 4:16 PM

## 2021-09-02 NOTE — [facility] Group Notes (Signed)
Goals Group 09/02/2021   Group Focus: affirmation, clarity of thought, and goals/reality orientation Treatment Modality:  Psychoeducation Interventions utilized were assignment, group exercise, and support Purpose: To be able to understand and verbalize the reason for their admission to the hospital. To understand that the medication helps with their chemical imbalance but they also need to work on their choices in life. To be challenged to develop a list of 30 positives about themselves. Also introduce the concept that "feelings" are not reality.  Participation Level:  Active  Participation Quality:  Appropriate  Affect:  Appropriate  Cognitive:  Appropriate  Insight:  Improving  Engagement in Group:  Engaged  Additional Comments. Rates his energy at a 4/10. Participated in the group.  Paulino Rily

## 2021-09-03 LAB — BASIC METABOLIC PANEL
Anion gap: 8 (ref 5–15)
BUN: 9 mg/dL (ref 6–20)
CO2: 29 mmol/L (ref 22–32)
Calcium: 8.9 mg/dL (ref 8.9–10.3)
Chloride: 108 mmol/L (ref 98–111)
Creatinine, Ser: 1.12 mg/dL (ref 0.61–1.24)
GFR, Estimated: >60 mL/min (ref >60)
Glucose, Bld: 96 mg/dL (ref 70–99)
Potassium: 3.7 mmol/L (ref 3.5–5.1)
Sodium: 145 mmol/L (ref 135–145)

## 2021-09-03 LAB — LIPID PANEL
Cholesterol: 171 mg/dL (ref 0–200)
HDL: 45 mg/dL (ref >40)
LDL Cholesterol: 87 mg/dL (ref 0–99)
Total CHOL/HDL Ratio: 3.8 RATIO
Triglycerides: 194 mg/dL — ABNORMAL HIGH (ref <150)
VLDL: 39 mg/dL (ref 0–40)

## 2021-09-03 MED ORDER — MELATONIN 3 MG PO TABS: 3.0000 mg | ORAL_TABLET | Freq: Every evening | ORAL | Status: DC | PRN

## 2021-09-03 MED ORDER — TRAZODONE HCL 50 MG PO TABS
50.0000 mg | ORAL_TABLET | Freq: Every evening | ORAL | Status: DC | PRN
  Administered 2021-09-03 – 2021-09-06 (×4): 50 mg via ORAL
  Filled 2021-09-03 (×5): qty 1

## 2021-09-03 NOTE — [facility] Group Notes (Signed)
Adult Psychoeducational Group Note Date:  09/02/2021 Time:  0900-1045 Group Topic/Focus: PROGRESSIVE RELAXATION. A group where deep breathing is taught and tensing and relaxation muscle groups is used. Imagery is used as well.  Pts are asked to imagine 3 pillars that hold them up when they are not able to hold themselves up and to share that with the group.  Participation Level:  Active  Participation Quality:  Appropriate  Affect:  Appropriate  Cognitive:  Oriented  Insight: Improving  Engagement in Group:  Engaged  Modes of Intervention:  Activity, Discussion, Education, and Support  Additional Comments:  Pt rates his energy at a 4/10. States his brother and his two friends hold him up.  Zachary [name]

## 2021-09-03 NOTE — [facility] Group Notes (Signed)
Adult Psychoeducational Group  Date:  09/03/2021 Time:  1300-1400  Group Topic/Focus: Continuation of the group from Saturday. Looking at the lists that were created and talking about what needs to be done with the homework of 30 positives about themselves.                                     Talking about taking their power back and helping themselves to develop a positive self esteem.      Participation Quality:  Appropriate  Affect:  Appropriate  Cognitive:  Oriented  Insight: Improving  Engagement in Group:  Engaged  Modes of Intervention:  Activity, Discussion, Education, and Support  Additional Comments:  Rates his energy at an 8/10 Partiipated fully in the group.  Paulino Rily

## 2021-09-03 NOTE — Progress Notes (Signed)
Adult Psychoeducational Group Note  Date:  09/03/2021 Time:  8:29 PM  Group Topic/Focus:  Wrap-Up Group:   The focus of this group is to help patients review their daily goal of treatment and discuss progress on daily workbooks.  Participation Level:  Active  Participation Quality:  Appropriate  Affect:  Appropriate  Cognitive:  Oriented  Insight: Limited  Engagement in Group:  Engaged  Modes of Intervention:  Education and Exploration  Additional Comments:  Patient attended and participated in group tonight. He reports that he have learnt no to let other people body language or words affect you.  Salley Scar[facility] 09/03/2021, 8:29 PM

## 2021-09-03 NOTE — Group Note (Addendum)
[facility] LCSW Group Therapy Note  Date/Time:  09/03/2021  10:00AM-11:00AM  Type of Therapy and Topic:  Group Therapy:  Obstacles at Discharge  Participation Level:  Active   Description of Group: In this process group, members discussed their anticipated obstacles to wellness when they discharge.  Patients were asked to share what their goal is at discharge.  We talked in detail about the importance of setting boundaries in our lives and how to set such boundaries.  It was discussed how sometimes we set boundaries with others and sometimes we set those boundaries with ourselves.  We then listened to different songs that dealt with addiction, depression, and anxiety.  The group discussed how they could relate to each song and how each could help them to stay focused on their wellness.    Therapeutic Goals: Patients will determine one major goal that they wish to pursue at discharge, in terms of remaining well Patients will think about and acknowledge the obstacles they think they will face at hospital discharge Patients will be able to realize that they are not alone and others are actually facing similar obstacles Patients will be introduced to ways in which to set boundaries in a healthy, productive fashion Patients will identify how music can help or harm their recovery efforts Patients will explore the use of music as a coping skill  Summary of Patient Progress:  At the beginning of group, patient expressed his goal at discharge is "peace of mind" while his obstacles at discharge will likely be no longer having a job because he told his boss what was going on and she fired him.  His participation in group included speaking about the impact of songs on him.  Therapeutic Modalities: Activity Processing   Selmer Dominion, LCSW

## 2021-09-03 NOTE — Progress Notes (Addendum)
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  Of note, on chart review, patient reported suicidal ideation on 07/03/2021, which believed may have been due to discontinuation of budesonide.  Yesterday's recommendation per psychiatry team: -Continue home BuSpar 30 mg twice daily - Restart home Luvox at 100 mg nightly.  Discussed with patient FDA approved maximal dose is 300 mg, and we will not go beyond that this admission, to which she voiced understanding - restarting slow titration given recent OD - Continue home Lamictal 200 mg daily (verifies he has not missed doses and Lamictal level 2.2 on admission) - Holding Lithium - level less than 0.06 on admission and give OD history will not restart - unclear if he was taking prior to admission - Continue home Zyprexa 15 mg nightly -Restarted Nicorette gum 2 mg prn for nicotine withdrawal.  -Continue Aricept 10 mg hs for dementia  On evaluation today: Patient is seen at the bedside, and reports feeling well, as he just confided in his CEO about what is going on, and was let go from his job.  He begins rationalizing about why this is a good thing, which he later recants and states is a stressor.  He continues to report difficult interactions with staff, which make this a less than ideal hospitalization.  He is very fixated today on the team not disclosing to his brother any of his thoughts or actions that precipitated his suicide attempt. He does disclose the situation that led him to attempt suicide, as he has had intrusive thoughts about "potentially being  a criminal." We discussed reality testing, that he does not have a warrant, no charges have been filed against him, and the other party does not know his name.  He reports feeling better after that discussion.  He again mentions the situation with the social worker, and his brother knowing that he is "a suspected criminal."  Despite that being the only information that was given,  he feels betrayed because the conversation was supposed to be in confidentiality between he and his Education officer, museum.  He becomes frustrated and labile during this conversation, yelling "goddamnit you are not listening."  He does quickly apologize for his frustration, and becomes hyper fixated on the fact that he actually became angry despite being reassured that it was not an issue.  Patient does report some difficulty falling asleep last night, as his bed is uncomfortable.  He reports an intact appetite, but says that the food here is poor.  Of past symptoms, patient reported that he "now realizes that there was a problem because he had not cleaned his home, his appetite was decreased by half, and he let his hygiene go." He does not endorse tremors today nor any other somatic complaints.  He otherwise denies SI, HI, AVH, paranoia, and other psychotic symptoms including thought insertion/withdrawal, thought broadcasting, and special messages.     Chart review: outpatient note per primary psychiatrist, Dr. Clovis Pu- July 2023 visit: Zachary [name] has been diagnosed with treatment resistant depression with psychotic features, OCD and social anxiety but schizoaffective may be more appropriate diagnosis given this chronic paranoid thinking.  Specifically he is chronically  suspicious that he has dementia despite neuropsychological testing which tells him he does not.  He also has chronic thoughts that other people find his odor offensive because of passing gas.  There is very little evidence to support this.  He has been under my psychiatric care  for many many years and never owed noticed body odor and appointments at any time.  He also suspects that people are thinking negatively of him regularly.  Overall his paranoia and anxiety and depression are unchanged with reduction in olanzapine to 48m daily months ago .  Cognitve complaints could be related to untreated OSA. Pending sleep study results. Hoarding worsened since Luvox reduced to 300 mg daily so increased to 400 mg daily in 2022. Buspirone 30 mg twice daily, Aricept 10 mg daily, fluvoxamine 400 mg daily, lamotrigine 200 mg daily, lithium 150 nightly, olanzapine 15 mg nightly. OK prn lorazepam usually less sedating than X anax 0.5-1.0 mg BID prn  Past psych med: Abilify, Zyprexa, Seroquel 600, olanzapine 30 Wellbutrin, nortriptyline, clomipramine, mirtazapine,  fluvoxamine 400, Lamotrigine, lithium no response,  stimulants, Aricept  buspirone 60,   Xanax Naltrexone for compulsive buying NR  Principal Problem: MDD (major depressive disorder), recurrent episode, severe (HCC) Diagnosis: Principal Problem:   MDD (major depressive disorder), recurrent episode, severe ([place] Active Problems:   [place] drug overdose ([place]   Total Time spent with patient: 30 minutes  Past Psychiatric History: See H&P  Past Medical History:  Past Medical History:  Diagnosis Date   Anxiety    Crohn disease ([place]    pt reports subsequent testing was normal   Depression    GERD (gastroesophageal reflux disease)    Herpes simplex    Hoarding disorder    Hyperlipidemia    Hypertension    IBS (irritable bowel syndrome)    Incontinence     Past Surgical History:  Procedure Laterality Date   APPENDECTOMY     COLONOSCOPY     POLYPECTOMY     TWISTED TESTICLES  1970   [place]    UMBILICAL HERNIA REPAIR     Family History:  Family History  Problem Relation Age of Onset   Arthritis Mother    Stroke Mother    Depression Mother    Hypertension Father    Prostate cancer Father  671  Breast cancer Maternal Grandmother    Cancer Maternal Grandfather        unknown type   Cancer Paternal Grandmother        unknown type   Lung cancer Maternal Aunt    Prostate cancer Paternal Uncle    Prostate cancer Paternal Uncle    Colon cancer Neg Hx    Rectal cancer Neg Hx    Stomach cancer Neg Hx    Esophageal cancer Neg Hx    Pancreatic cancer Neg Hx    Family Psychiatric  History: See H&P Social History:  Social History   Substance and Sexual Activity  Alcohol Use No     Social History   Substance and Sexual Activity  Drug Use No    Social History   Socioeconomic History   Marital status: Single    Spouse name: Not on file   Number of children: Not on file   Years of education: Not on file   Highest education level: Not on file  Occupational History   Not on file  Tobacco Use   Smoking status: Former    Types: Cigarettes  Quit date: 21    Years since quitting: 43.7   Smokeless tobacco: Never  Vaping Use   Vaping Use: Never used  Substance and Sexual Activity   Alcohol use: No   Drug use: No   Sexual activity: Not Currently  Other Topics Concern   Not on file  Social History Narrative   Lives alone.     Social Determinants of Health   Financial Resource Strain: Not on file  Food Insecurity: Not on file  Transportation Needs: Not on file  Physical Activity: Not on file  Stress: Not on file  Social Connections: Not on file   Sleep: Fair  Appetite:  Fair  Current Medications: Current Facility-Administered Medications  Medication Dose Route Frequency Provider Last Rate Last Admin   alum & mag hydroxide-simeth (MAALOX/MYLANTA) 200-200-20 MG/5ML suspension 30 mL  30 mL Oral Q4H PRN Starkes-Perry, Gayland Curry, FNP       aspirin EC tablet 81 mg  81 mg Oral Daily Suella Broad, FNP   81 mg at 09/03/21 0738   busPIRone (BUSPAR) tablet 30 mg  30 mg Oral BID Suella Broad, FNP   30 mg at 09/03/21 0738   donepezil (ARICEPT)  tablet 10 mg  10 mg Oral QHS Lavella Hammock, MD   10 mg at 09/02/21 2110   feeding supplement (ENSURE ENLIVE / ENSURE PLUS) liquid 237 mL  237 mL Oral BID BM Nelda Marseille, Mari Battaglia E, MD   237 mL at 09/01/21 1017   fluvoxaMINE (LUVOX) tablet 100 mg  100 mg Oral QHS Lavella Hammock, MD   100 mg at 09/02/21 2110   lamoTRIgine (LAMICTAL) tablet 200 mg  200 mg Oral Daily Suella Broad, FNP   200 mg at 09/03/21 0737   levothyroxine (SYNTHROID) tablet 75 mcg  75 mcg Oral Q0600 Suella Broad, FNP   75 mcg at 09/03/21 2751   lipase/protease/amylase (CREON) capsule 144,000 Units  144,000 Units Oral TID WC Harlow Asa, MD   144,000 Units at 09/03/21 0626   lipase/protease/amylase (CREON) capsule 72,000 Units  72,000 Units Oral PRN Harlow Asa, MD   72,000 Units at 09/02/21 1425   loperamide (IMODIUM) capsule 2 mg  2 mg Oral Q4H PRN Suella Broad, FNP       losartan (COZAAR) tablet 100 mg  100 mg Oral Daily Suella Broad, FNP   100 mg at 09/03/21 7001   magnesium hydroxide (MILK OF MAGNESIA) suspension 30 mL  30 mL Oral Daily PRN Suella Broad, FNP       mesalamine (PENTASA) CR capsule 1,500 mg  1,500 mg Oral Daily Suella Broad, FNP   1,500 mg at 09/03/21 7494   nicotine polacrilex (NICORETTE) gum 2 mg  2 mg Oral PRN Lavella Hammock, MD       OLANZapine [facility]) tablet 15 mg  15 mg Oral QHS Suella Broad, FNP   15 mg at 09/02/21 2110   pantoprazole (PROTONIX) EC tablet 40 mg  40 mg Oral Daily Suella Broad, FNP   40 mg at 09/03/21 4967   traZODone (DESYREL) tablet 50 mg  50 mg Oral QHS PRN,MR X 1 Ajibola, Ene A, NP        Lab Results:  Results for orders placed or performed during the hospital encounter of 08/31/21 (from the past 48 hour(s))  Urinalysis, Routine w reflex microscopic Urine, Clean Catch     Status: Abnormal   Collection Time: 09/01/21  5:30  PM  Result Value Ref Range   Color, Urine COLORLESS (A) YELLOW    APPearance CLEAR CLEAR   Specific Gravity, Urine 1.002 (L) 1.005 - 1.030   pH 6.0 5.0 - 8.0   Glucose, UA NEGATIVE NEGATIVE mg/dL   Hgb urine dipstick SMALL (A) NEGATIVE   Bilirubin Urine NEGATIVE NEGATIVE   Ketones, ur NEGATIVE NEGATIVE mg/dL   Protein, ur NEGATIVE NEGATIVE mg/dL   Nitrite NEGATIVE NEGATIVE   Leukocytes,Ua NEGATIVE NEGATIVE   Bacteria, UA NONE SEEN NONE SEEN    Comment: Performed at [facility] [address], [place], [place] [address]  Troponin I (High Sensitivity)     Status: None   Collection Time: 09/01/21  8:44 PM  Result Value Ref Range   Troponin I (High Sensitivity) 4 <18 ng/L    Comment: (NOTE) Elevated high sensitivity troponin I (hsTnI) values and significant  changes across serial measurements may suggest ACS but many other  chronic and acute conditions are known to elevate hsTnI results.  Refer to the "Links" section for chest pain algorithms and additional  guidance. Performed at [facility], [place] [address], [place], [place] [address]   CBC with Differential     Status: None   Collection Time: 09/01/21  8:44 PM  Result Value Ref Range   WBC 10.5 4.0 - 10.5 K/uL   RBC 4.33 4.22 - 5.81 MIL/uL   Hemoglobin 13.7 13.0 - 17.0 g/dL   HCT 41.7 39.0 - 52.0 %   MCV 96.3 80.0 - 100.0 fL   MCH 31.6 26.0 - 34.0 pg   MCHC 32.9 30.0 - 36.0 g/dL   RDW 12.8 11.5 - 15.5 %   Platelets 349 150 - 400 K/uL   nRBC 0.0 0.0 - 0.2 %   Neutrophils Relative % 68 %   Neutro Abs 7.3 1.7 - 7.7 K/uL   Lymphocytes Relative 20 %   Lymphs Abs 2.1 0.7 - 4.0 K/uL   Monocytes Relative 9 %   Monocytes Absolute 0.9 0.1 - 1.0 K/uL   Eosinophils Relative 2 %   Eosinophils Absolute 0.2 0.0 - 0.5 K/uL   Basophils Relative 1 %   Basophils Absolute 0.1 0.0 - 0.1 K/uL   Immature Granulocytes 0 %   Abs Immature Granulocytes 0.02 0.00 - 0.07 K/uL    Comment: Performed at [facility], [place] [address] [place], [place]  [address]  Basic metabolic panel     Status: Abnormal   Collection Time: 09/01/21  8:44 PM  Result Value Ref Range   Sodium 140 135 - 145 mmol/L   Potassium 3.9 3.5 - 5.1 mmol/L   Chloride 103 98 - 111 mmol/L   CO2 29 22 - 32 mmol/L   Glucose, Bld 102 (H) 70 - 99 mg/dL    Comment: Glucose reference range applies only to samples taken after fasting for at least 8 hours.   BUN 11 6 - 20 mg/dL   Creatinine, Ser 1.33 (H) 0.61 - 1.24 mg/dL   Calcium 8.7 (L) 8.9 - 10.3 mg/dL   GFR, Estimated >60 >60 mL/min    Comment: (NOTE) Calculated using the CKD-EPI Creatinine Equation (2021)    Anion gap 8 5 - 15    Comment: Performed at [facility], [place] [address], [place], [place] [address]  Magnesium     Status: None   Collection Time: 09/01/21  8:44 PM  Result Value Ref Range   Magnesium 2.2 1.7 - 2.4  mg/dL    Comment: Performed at [facility], [place] [address], [place], [place] [address]  Hemoglobin A1c     Status: Abnormal   Collection Time: 09/02/21  6:46 AM  Result Value Ref Range   Hgb A1c MFr Bld 5.7 (H) 4.8 - 5.6 %    Comment: (NOTE) Pre diabetes:          5.7%-6.4%  Diabetes:              >6.4%  Glycemic control for   <7.0% adults with diabetes    Mean Plasma Glucose 116.89 mg/dL    Comment: Performed at [facility] [address], [place], [place] [address]  Basic metabolic panel     Status: Abnormal   Collection Time: 09/02/21  6:46 AM  Result Value Ref Range   Sodium 144 135 - 145 mmol/L   Potassium 3.3 (L) 3.5 - 5.1 mmol/L   Chloride 107 98 - 111 mmol/L   CO2 29 22 - 32 mmol/L   Glucose, Bld 116 (H) 70 - 99 mg/dL    Comment: Glucose reference range applies only to samples taken after fasting for at least 8 hours.   BUN 10 6 - 20 mg/dL   Creatinine, Ser 1.25 (H) 0.61 - 1.24 mg/dL   Calcium 8.9 8.9 - 10.3 mg/dL   GFR, Estimated >60 >60 mL/min    Comment: (NOTE) Calculated using the CKD-EPI Creatinine Equation (2021)    Anion  gap 8 5 - 15    Comment: Performed at [facility], [place] [address] [place], [place] [address]  Basic metabolic panel     Status: None   Collection Time: 09/03/21  6:45 AM  Result Value Ref Range   Sodium 145 135 - 145 mmol/L   Potassium 3.7 3.5 - 5.1 mmol/L   Chloride 108 98 - 111 mmol/L   CO2 29 22 - 32 mmol/L   Glucose, Bld 96 70 - 99 mg/dL    Comment: Glucose reference range applies only to samples taken after fasting for at least 8 hours.   BUN 9 6 - 20 mg/dL   Creatinine, Ser 1.12 0.61 - 1.24 mg/dL   Calcium 8.9 8.9 - 10.3 mg/dL   GFR, Estimated >60 >60 mL/min    Comment: (NOTE) Calculated using the CKD-EPI Creatinine Equation (2021)    Anion gap 8 5 - 15    Comment: Performed at [facility], [place] [address] [place], [place] [address]  Lipid panel     Status: Abnormal   Collection Time: 09/03/21  6:45 AM  Result Value Ref Range   Cholesterol 171 0 - 200 mg/dL   Triglycerides 194 (H) <150 mg/dL   HDL 45 >40 mg/dL   Total CHOL/HDL Ratio 3.8 RATIO   VLDL 39 0 - 40 mg/dL   LDL Cholesterol 87 0 - 99 mg/dL    Comment:        Total Cholesterol/HDL:CHD Risk Coronary Heart Disease Risk Table                     Men   Women  1/2 Average Risk   3.4   3.3  Average Risk       5.0   4.4  2 X Average Risk   9.6   7.1  3 X Average Risk  23.4   11.0        Use the calculated Patient Ratio above and the CHD  Risk Table to determine the patient's CHD Risk.        ATP III CLASSIFICATION (LDL):  <100     mg/dL   Optimal  100-129  mg/dL   Near or Above                    Optimal  130-159  mg/dL   Borderline  160-189  mg/dL   High  >190     mg/dL   Very High Performed at [facility] [address], [place], [place] [address]     Blood Alcohol level:  Lab Results  Component Value Date   ETH <10 40/08/6759    Metabolic Disorder Labs: Lab Results  Component Value Date   HGBA1C 5.7 (H) 09/02/2021   MPG 116.89  09/02/2021   No results found for: "PROLACTIN" Lab Results  Component Value Date   CHOL 171 09/03/2021   TRIG 194 (H) 09/03/2021   HDL 45 09/03/2021   CHOLHDL 3.8 09/03/2021   VLDL 39 09/03/2021   LDLCALC 87 09/03/2021   LDLCALC 101 (H) 07/16/2018    Physical Findings:  Musculoskeletal: Strength & Muscle Tone: within normal limits Gait & Station: normal Patient leans: N/A  Psychiatric Specialty Exam:  Presentation  General Appearance: appears older than stated age, fair hygiene, dressed in scrubs   Eye Contact:Good   Speech:Clear and Coherent; Normal Rate   Speech Volume:Normal   Handedness:Right    Mood and Affect  Mood: described as "not good due to staff interactions"- appears anxious  Affect:Labile    Thought Process  Thought Processes:evasive at times, circumstantial, loses train of thought at times   Descriptions of Associations:Intact   Orientation:Full (Time, Place and Person)   Thought Content:Rumination; Perseveration (Perseverating and ruminative about Education officer, museum betraying him and about possibly being a criminal ) Denies SI, HI, AVH, ideas of reference or first rank symptoms; no compulsions noted on exam; denies paranoia but is ruminative and obsessive about confidentially of his care  Hallucinations:Hallucinations: None  Ideas of Reference:None   Suicidal Thoughts:Suicidal Thoughts: No  Homicidal Thoughts:Homicidal Thoughts: No   Sensorium  Memory: Some difficulty remembering what he has previously told the team in terms of history or concerns with some repeating of details  Judgment:Fair   Insight:Shallow; Lacking    Executive Functions  Concentration:Fair   Attention Span:Fair   [place] of Knowledge:Good   Language:Good    Psychomotor Activity  Psychomotor Activity:No visible tremors noted today; no akathisias   Assets  Assets:Communication Skills; Desire for Improvement; Financial  Resources/Insurance; Housing; Social Support    Sleep  Total time unrecorded   Physical Exam Vitals reviewed.  Constitutional:      General: He is not in acute distress. HENT:     Head: Normocephalic and atraumatic.     Mouth/Throat:     Mouth: Mucous membranes are moist.     Pharynx: Oropharynx is clear.  Neurological:     General: No focal deficit present.     Mental Status: He is alert.     Gait: Gait normal.    Review of Systems  Respiratory:  Negative for shortness of breath.   Cardiovascular:  Negative for chest pain.  Gastrointestinal:  Negative for nausea and vomiting.  Neurological:  Negative for tremors and headaches.   Blood pressure (!) 120/93, pulse 96, temperature (!) 97.4 F (36.3 C), temperature source Oral, resp. rate 16, height 6' 3"  (1.905 m), weight 107.5 kg, SpO2 96 %.  Body mass index is 29.62 kg/m.   Treatment Plan Summary: ASSESSMENT: Principal Problem: Major depressive disorder, recurrent, severe currently without psychotic features Mixed obsessional thoughts and acts by hx (r/o OCD) Social anxiety d/o by hx Hoarding d/o by hx Nicotine use disorder Suicide attempt by drug overdose Memory loss (mild cognitive impairment by hx) Hypothyroidism Hypertension GERD History of Crohn's disease  Pancreatic insufficiency H/o hyponatremia  PLAN: Safety and Monitoring:             -- Involuntary admission to inpatient psychiatric unit for safety, stabilization and treatment             -- Daily contact with patient to assess and evaluate symptoms and progress in treatment             -- Patient's case to be discussed in multi-disciplinary team meeting             -- Observation Level : q15 minute checks             -- Vital signs:  q12 hours             -- Precautions: suicide, elopement, and assault   2. Psychiatric Diagnoses and Treatment:  #Major depressive disorder-recurrent, severe (currently without psychotic features) #Mixed obsessional  thoughts and acts by hx (r/o OCD) #Social anxiety d/o by hx #Hoarding d/o by hx -Continue home BuSpar 30 mg twice daily - Continue home Luvox at 100 mg nightly; will increase to 150 mg tomorrow.  Discussed with patient FDA approved maximal dose is 300 mg, and we will not go beyond that this admission, to which he voiced understanding - restarting slow titration given recent OD - Continue home Lamictal 200 mg daily (verifies he has not missed doses and Lamictal level 2.2 on admission) - Holding Lithium - level less than 0.06 on admission and given OD history will not restart -patient confirmed that he was taking prior to admission, but for "dementia prophylaxis" and not mania - Continue home Zyprexa 15 mg nightly A1c 5.7; Lipids with TG 194; QTC 443m  #Memory loss (mild cognitive impairment by hx) -Continue home Aricept 10 mg nightly  #Nicotine use disorder - Continue Nicorette 2 mg as needed   3. Medical Diagnoses and Treatment: #Hypothyroidism -Continue home Synthroid 75 mcg daily  # Hypertension -Continue home losartan 100 mg daily  # GERD -Continue home Protonix 40 mg daily  # History of Crohn's disease c/b fecal incontinence -Continue home mesalamine 1500 mg daily -Discontinue budesonide 9 mg, as PCP note from 7/17 reported discontinuation as fecal incontinence seemed to worsen on this medication.  # Pancreatic insufficiency -Continue home Creon 144,000 units 3 times daily with meals  #CVA prophylaxis 2/2 history of arrhythmia - Continue home enteric-coated aspirin 81 mg daily   #Nonspecific EKG changes/Incomplete RBBB - Sent to ED and cleared from cardiac standpoint on 9/1 (troponin I 4) - f/u with outpatient PCP after discharge  #h/o hyponatremia - current Na+ 144 - holding Na+ tab at this time  #small blood in urine - currently asymptomatic - will need PCP f/u after discharge  #Elevated creatinine, resolved 1.12 (down from 1.51 4 days ago and 1.25 1-day ago) -  Encouraged po hydration  #Hypokalemia, resolved - Received Klor 466m 9/2 X1 -repeat potassium 3.7    4. Discharge Planning:              -- Social work and case management to assist with discharge planning and identification of hospital follow-up needs  prior to discharge             -- Estimated LOS 5 to 7 days             -- Discharge Concerns: Need to establish a safety plan             -- Discharge Goals: Return home with outpatient referrals for mental health follow-up including medication management/psychotherapy    Rosezetta Schlatter, MD 09/03/2021, 8:33 AM  Attestation signed by Harlow Asa, MD at 09/03/2021  3:52 PM   I have independently evaluated the patient during a face-to-face assessment on 09/03/21. I reviewed the patient's chart, and I participated in key portions of the service. I discussed the case with the [employer], and I agree with the assessment and plan of care as documented in the [employer] note with edits.    I personally spent 40 minutes on the unit in direct patient care. The direct patient care time included face-to-face time with the patient, reviewing the patient's chart, communicating with other professionals, and coordinating care. Greater than 50% of this time was spent in counseling or coordinating care with the patient regarding goals of hospitalization, psycho-education, and discharge planning needs.   On exam, the patient is ruminative about perceived difficult interactions with several staff and about intrusive worry he may be a criminal based on actions prior to admission. He remains reluctant and somewhat paranoid about discussing events that led to his suicide attempt. Extensive time was spent discussing the need to challenge negative cognitive distortions and to work on reality testing. He became labile and argumentative at points during the interview and required redirection. He requires multiple reassurances during exam that team accepts his  apology for his frustration intolerance and repeated reassurances that he has no evidence to support idea that he is a criminal or is going to jail. His previous diagnoses as noted in his outpatient records with his primary psychiatrist were discussed, and he agrees he has trouble with ruminating thoughts, obsessive thinking, and hoarding. He again denies having OCD rituals. He denies current SI or HI. He denies AVH, ideas of reference, or first rank symptoms. He states he was previously on Lithium low dose for "prevention of Alzheimer's" but we discussed that given his recent OD we would not restart that medication. He understands that we are titrating up gradually on his Luvox after recent overdose on this medication. He feels his home doses of Lamictal, Zyprexa, and Buspar have helped manage his anxiety, ruminations, and mood instability and wishes to remain on these medications. He was made aware that his creatinine and K+ have normalized but that he does have mildly elevated A1c, incomplete RBBB on EKG, and small blood in urine that need to be managed/monitored by his outpatient PCP after discharge. He requests Melatonin for insomnia.      Viann Fish, MD, Alda Ponder

## 2021-09-03 NOTE — Progress Notes (Signed)
   09/03/21 1030  Psych Admission Type (Psych Patients Only)  Admission Status Involuntary  Psychosocial Assessment  Patient Complaints Anxiety  Eye Contact Fair  Facial Expression Animated  Affect Appropriate to circumstance  Speech Logical/coherent  Interaction Assertive  Motor Activity Restless  Appearance/Hygiene Unremarkable  Behavior Characteristics Appropriate to situation  Mood Anxious;Pleasant  Thought Process  Coherency WDL  Content WDL  Delusions None reported or observed  Perception WDL  Hallucination None reported or observed  Judgment Poor  Confusion None  Danger to Self  Current suicidal ideation? Denies  Danger to Others  Danger to Others None reported or observed

## 2021-09-04 DIAGNOSIS — F422 Mixed obsessional thoughts and acts: Secondary | ICD-10-CM | POA: Diagnosis present

## 2021-09-04 MED ORDER — FLUVOXAMINE MALEATE 50 MG PO TABS
150.0000 mg | ORAL_TABLET | Freq: Every day | ORAL | Status: DC
  Administered 2021-09-04 – 2021-09-05 (×2): 150 mg via ORAL
  Filled 2021-09-04 (×4): qty 3

## 2021-09-04 MED ORDER — CLONIDINE HCL 0.1 MG PO TABS: 0.1000 mg | ORAL_TABLET | Freq: Three times a day (TID) | ORAL | Status: DC | PRN

## 2021-09-04 NOTE — Progress Notes (Signed)
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  I called my company's CEO and she has some questions for you. 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 Can I lift up to fifty pounds? Can I locate items on shelving? Please email Celso Amy at [email or web address].  (You have my permission. to answer work related questions.)   [place] note is in patient's chart.

## 2021-09-04 NOTE — Progress Notes (Signed)
   09/03/21 2105  Psych Admission Type (Psych Patients Only)  Admission Status Involuntary  Psychosocial Assessment  Patient Complaints Anxiety  Eye Contact Fair  Facial Expression Animated  Affect Anxious  Speech Logical/coherent  Interaction Assertive  Motor Activity Fidgety  Appearance/Hygiene Improved  Behavior Characteristics Anxious  Mood Pleasant  Thought Process  Coherency WDL  Content WDL  Delusions None reported or observed  Perception WDL  Hallucination None reported or observed  Judgment WDL  Confusion None  Danger to Self  Current suicidal ideation? Denies  Danger to Others  Danger to Others None reported or observed

## 2021-09-04 NOTE — [facility] Group Notes (Signed)
Adult Psychoeducational Group Note  Date:  09/04/2021 Time:  9:55 AM  Group Topic/Focus:  Goals Group:   The focus of this group is to help patients establish daily goals to achieve during treatment and discuss how the patient can incorporate goal setting into their daily lives to aide in recovery.  Participation Level:  Did Not Attend  Kern Reap 09/04/2021, 9:55 AM

## 2021-09-04 NOTE — Plan of Care (Signed)
Nurse discussed anxiety, depression and coping skills with patient.  

## 2021-09-04 NOTE — Group Note (Signed)
LCSW Group Therapy  Type of Therapy and Topic:  Group Therapy: Thoughts, Feelings, and Actions  Participation Level:  Did Not Attend   Description of Group:   In this group, each patient discussed their previous experiencing and understanding of overthinking, identifying the harmful impact on their lives. As a group, each patient was introduced to the basic concepts of Cognitive Behavioral Therapy: that thoughts, feelings, and actions are all connected and influence one another. They were given examples of how overthinking can affect our feelings, actions, and vise versa. The group was then asked to analyze how overthinking was harmful and brainstorm alternative thinking patterns/reactions to the example situation. Then, each group member filled out and identified their own example situation in which a problem situation caused their thoughts, feelings, and actions to be negatively impacted; they were asked to come up with 3 new (more adaptive/positive) thoughts that led to 3 new feelings and actions.  Therapeutic Goals: Patients will review and discuss their past experience with overthinking. Patients will learn the basics of the CBT model through group-led examples.. Patients will identify situations where they may have negative thoughts, feelings, or actions and will then reframe the situation using more positive thoughts to react differently.  Summary of Patient Progress:  Did not attend  Therapeutic Modalities:   Cognitive Cade, LCSW 09/04/2021  2:25 PM

## 2021-09-04 NOTE — Progress Notes (Signed)
D:  Zachary [name] was visible on the unit.  He did not attend evening AA group.  He denied SI/HI or AVH.  He denied any depression but did report anxiety 2/10 (10 the worst).  He had considerable difficulty falling asleep and finally fell asleep around 0115.  He reported that he is excited at the possibility of being discharged Wednesday.   A:  1:1 with RN for support and encouragement.  Medications given as ordered.  PRN trazodone given for sleep.  Q 15 minute checks maintained for safety.  Encouraged participation in group and unit activities.   R:  He is currently resting with his eyes closed and appears to be asleep.  He remains safe on the unit.      09/04/21 2139  Psych Admission Type (Psych Patients Only)  Admission Status Involuntary  Psychosocial Assessment  Patient Complaints Anxiety  Eye Contact Fair  Facial Expression Anxious  Affect Anxious  Speech Logical/coherent  Interaction Assertive  Motor Activity Slow  Appearance/Hygiene Unremarkable  Behavior Characteristics Anxious  Mood Pleasant  Thought Process  Coherency WDL  Content WDL  Delusions None reported or observed  Perception WDL  Hallucination None reported or observed  Judgment Impaired  Confusion Mild  Danger to Self  Current suicidal ideation? Denies  Danger to Others  Danger to Others None reported or observed

## 2021-09-04 NOTE — Progress Notes (Addendum)
Patient denied SI and HI, contracts for safety.  Denied A/V hallucinations.  Patient stated he had upset stomach this morning.   Ginger ale given to patient.  Had one bad dream last night.  After taking morning medications, patient has been resting in bed.  Respirations even and unlabored.  No signs/symptoms of pain/distress noted on patient's face/body movements.  Safety maintained with 15 minute checks.

## 2021-09-04 NOTE — Progress Notes (Signed)
Patient did not attend the evening speaker [place] meeting.

## 2021-09-04 NOTE — Progress Notes (Addendum)
[facility] MD Progress Note  09/04/2021 1:40 PM Zachary [name]  MRN:  [id number] Subjective: Zachary [name] is a 61 year old male with a past psychiatric history of major depressive disorder with psychotic features, mixed obsessional thoughts (r/o OCD) social anxiety, hoarding d/o, and mild cognitive impairment, and an extensive past medical history, who presented from [name] after a suicide attempt in which he took 30-40 Zachary [name] 100 mg tablets.  Of note, on chart review, patient reported suicidal ideation on 07/03/2021, which believed may have been due to discontinuation of budesonide.  On evaluation today: Patient is seen at the bedside, and reports feeling well. Apologized for the "smell" but I did not notice any malodorous smells on assessment. Reiterates multiple of his concerns this hospitalization (social worker telling brother about getting medication from patient home, knocking on door of patient room, not having a PRN med he needed) and how he had a vivid dream last night. Patient asked me to read into his dream and discussed his theory about what his dream meant but I redirected as he felt it was representing his anxiety. He was able to be redirected and seemed less ruminative regarding his concerns. Patient offered a reasonable solution regarding his concerns including discussing his concerns rather than ruminating on it. Patient reports doing "fine". Was initially fixated on me reading into his complaints as signs of depression but was redirectable.  Patient does report some difficulty falling asleep last night, as his bed is uncomfortable.  He reports an intact appetite, but says that the food here is poor.  Of past symptoms, patient reported that he "now realizes that there was a problem because he had not cleaned his home, his appetite was decreased by half, and he let his hygiene go." He does not endorse tremors today nor any other somatic complaints.  He otherwise denies SI, HI, AVH,  paranoia, and other psychotic symptoms including thought insertion/withdrawal, thought broadcasting, and ideas of reference. Discussed discharge planning and he was open to discharging Wednesday if doing well. Had initially expressed concern he would have to stay till Zachary [name] titrated back to 300 mg but told him that could be done as an outpatient and we were titrating based on mood symptoms. Patient understood and was amenable to increasing Zachary [name] to 150 mg for continued rumination and anxiety.   Chart review: outpatient note per primary psychiatrist, Dr. Clovis [name]- July 2023 visit: Zachary [name] has been diagnosed with treatment resistant depression with psychotic features, OCD and social anxiety but schizoaffective may be more appropriate diagnosis given this chronic paranoid thinking.  Specifically he is chronically  suspicious that he has dementia despite neuropsychological testing which tells him he does not.  He also has chronic thoughts that other people find his odor offensive because of passing gas.  There is very little evidence to support this.  He has been under my psychiatric care for many many years and never owed noticed body odor and appointments at any time.  He also suspects that people are thinking negatively of him regularly.  Overall his paranoia and anxiety and depression are unchanged with reduction in olanzapine to 51m daily months ago .  Cognitve complaints could be related to untreated OSA. Pending sleep study results. Hoarding worsened since Zachary [name] reduced to 300 mg daily so increased to 400 mg daily in 2022. Buspirone 30 mg twice daily, Aricept 10 mg daily, fluvoxamine 400 mg daily, lamotrigine 200 mg daily, lithium 150 nightly, olanzapine 15 mg nightly. OK prn lorazepam usually less sedating  than X anax 0.5-1.0 mg BID prn  Past psych med: Abilify, Zyprexa, Seroquel 600, olanzapine 30 Wellbutrin, nortriptyline, clomipramine, mirtazapine,  fluvoxamine 400, Lamotrigine, lithium no response,   stimulants, Aricept  buspirone 60,   Xanax Naltrexone for compulsive buying NR  Principal Problem: MDD (major depressive disorder), recurrent episode, severe (HCC) Diagnosis: Principal Problem:   MDD (major depressive disorder), recurrent episode, severe ([place]) Active Problems:   History of Crohn's disease   Social anxiety disorder   Essential hypertension   Pancreatic insufficiency   Memory loss   Mixed obsessional thoughts and acts   Total Time spent with patient: 30 minutes  Past Psychiatric History: See H&P  Past Medical History:  Past Medical History:  Diagnosis Date   Anxiety    Crohn disease ([place])    pt reports subsequent testing was normal   Depression    GERD (gastroesophageal reflux disease)    Herpes simplex    Hoarding disorder    Hyperlipidemia    Hypertension    IBS (irritable bowel syndrome)    Incontinence     Past Surgical History:  Procedure Laterality Date   APPENDECTOMY     COLONOSCOPY     POLYPECTOMY     TWISTED TESTICLES  1970   [place]    UMBILICAL HERNIA REPAIR     Family History:  Family History  Problem Relation Age of Onset   Arthritis Mother    Stroke Mother    Depression Mother    Hypertension Father    Prostate cancer Father 32   Breast cancer Maternal Grandmother    Cancer Maternal Grandfather        unknown type   Cancer Paternal Grandmother        unknown type   Lung cancer Maternal Aunt    Prostate cancer Paternal Uncle    Prostate cancer Paternal Uncle    Colon cancer Neg Hx    Rectal cancer Neg Hx    Stomach cancer Neg Hx    Esophageal cancer Neg Hx    Pancreatic cancer Neg Hx    Family Psychiatric  History: See H&P Social History:  Social History   Substance and Sexual Activity  Alcohol Use No     Social History   Substance and Sexual Activity  Drug Use No    Social History   Socioeconomic History   Marital status: Single    Spouse name: Not on file   Number of children: Not on file   Years of  education: Not on file   Highest education level: Not on file  Occupational History   Not on file  Tobacco Use   Smoking status: Former    Types: Cigarettes    Quit date: 1980    Years since quitting: 43.7   Smokeless tobacco: Never  Vaping Use   Vaping Use: Never used  Substance and Sexual Activity   Alcohol use: No   Drug use: No   Sexual activity: Not Currently  Other Topics Concern   Not on file  Social History Narrative   Lives alone.     Social Determinants of Health   Financial Resource Strain: Not on file  Food Insecurity: Not on file  Transportation Needs: Not on file  Physical Activity: Not on file  Stress: Not on file  Social Connections: Not on file   Sleep: Fair  Appetite:  Fair  Current Medications: Current Facility-Administered Medications  Medication Dose Route Frequency Provider Last Rate Last Admin   alum &  mag hydroxide-simeth (MAALOX/MYLANTA) 200-200-20 MG/5ML suspension 30 mL  30 mL Oral Q4H PRN Suella Broad, FNP       aspirin EC tablet 81 mg  81 mg Oral Daily Suella Broad, FNP   81 mg at 09/04/21 0758   busPIRone (BUSPAR) tablet 30 mg  30 mg Oral BID Suella Broad, FNP   30 mg at 09/04/21 0758   cloNIDine (CATAPRES) tablet 0.1 mg  0.1 mg Oral Q8H PRN Harlow Asa, MD       donepezil (ARICEPT) tablet 10 mg  10 mg Oral QHS Lavella Hammock, MD   10 mg at 09/03/21 2105   feeding supplement (ENSURE ENLIVE / ENSURE PLUS) liquid 237 mL  237 mL Oral BID BM Nelda Marseille, Mareena Cavan E, MD   237 mL at 09/01/21 1017   fluvoxaMINE (Zachary [name]) tablet 150 mg  150 mg Oral Aliene Altes, MD       lamoTRIgine (LAMICTAL) tablet 200 mg  200 mg Oral Daily Suella Broad, FNP   200 mg at 09/04/21 0757   levothyroxine (SYNTHROID) tablet 75 mcg  75 mcg Oral Q0600 Suella Broad, FNP   75 mcg at 09/04/21 0604   lipase/protease/amylase (CREON) capsule 144,000 Units  144,000 Units Oral TID WC Harlow Asa, MD   144,000 Units at  09/04/21 1210   lipase/protease/amylase (CREON) capsule 72,000 Units  72,000 Units Oral PRN Harlow Asa, MD   72,000 Units at 09/03/21 2027   loperamide (IMODIUM) capsule 2 mg  2 mg Oral Q4H PRN Suella Broad, FNP       losartan (COZAAR) tablet 100 mg  100 mg Oral Daily Suella Broad, FNP   100 mg at 09/04/21 0758   magnesium hydroxide (MILK OF MAGNESIA) suspension 30 mL  30 mL Oral Daily PRN Starkes-Perry, Gayland Curry, FNP       melatonin tablet 3 mg  3 mg Oral QHS PRN Harlow Asa, MD       mesalamine (PENTASA) CR capsule 1,500 mg  1,500 mg Oral Daily Suella Broad, FNP   1,500 mg at 09/04/21 0756   nicotine polacrilex (NICORETTE) gum 2 mg  2 mg Oral PRN Lavella Hammock, MD       OLANZapine [facility]) tablet 15 mg  15 mg Oral QHS Suella Broad, FNP   15 mg at 09/03/21 2105   pantoprazole (PROTONIX) EC tablet 40 mg  40 mg Oral Daily Suella Broad, FNP   40 mg at 09/04/21 0758   traZODone (DESYREL) tablet 50 mg  50 mg Oral QHS PRN,MR X 1 Ajibola, Ene A, NP   50 mg at 09/03/21 2106    Lab Results:  Results for orders placed or performed during the hospital encounter of 08/31/21 (from the past 48 hour(s))  Basic metabolic panel     Status: None   Collection Time: 09/03/21  6:45 AM  Result Value Ref Range   Sodium 145 135 - 145 mmol/L   Potassium 3.7 3.5 - 5.1 mmol/L   Chloride 108 98 - 111 mmol/L   CO2 29 22 - 32 mmol/L   Glucose, Bld 96 70 - 99 mg/dL    Comment: Glucose reference range applies only to samples taken after fasting for at least 8 hours.   BUN 9 6 - 20 mg/dL   Creatinine, Ser 1.12 0.61 - 1.24 mg/dL   Calcium 8.9 8.9 - 10.3 mg/dL   GFR, Estimated >60 >60 mL/min  Comment: (NOTE) Calculated using the CKD-EPI Creatinine Equation (2021)    Anion gap 8 5 - 15    Comment: Performed at [facility], [place] [address] [place], [place] [address]  Lipid panel     Status: Abnormal   Collection Time: 09/03/21  6:45  AM  Result Value Ref Range   Cholesterol 171 0 - 200 mg/dL   Triglycerides 194 (H) <150 mg/dL   HDL 45 >40 mg/dL   Total CHOL/HDL Ratio 3.8 RATIO   VLDL 39 0 - 40 mg/dL   LDL Cholesterol 87 0 - 99 mg/dL    Comment:        Total Cholesterol/HDL:CHD Risk Coronary Heart Disease Risk Table                     Men   Women  1/2 Average Risk   3.4   3.3  Average Risk       5.0   4.4  2 X Average Risk   9.6   7.1  3 X Average Risk  23.4   11.0        Use the calculated Patient Ratio above and the CHD Risk Table to determine the patient's CHD Risk.        ATP III CLASSIFICATION (LDL):  <100     mg/dL   Optimal  100-129  mg/dL   Near or Above                    Optimal  130-159  mg/dL   Borderline  160-189  mg/dL   High  >190     mg/dL   Very High Performed at [facility] [address] [place], [place] [address]     Blood Alcohol level:  Lab Results  Component Value Date   ETH <10 91/69/4503    Metabolic Disorder Labs: Lab Results  Component Value Date   HGBA1C 5.7 (H) 09/02/2021   MPG 116.89 09/02/2021   No results found for: "PROLACTIN" Lab Results  Component Value Date   CHOL 171 09/03/2021   TRIG 194 (H) 09/03/2021   HDL 45 09/03/2021   CHOLHDL 3.8 09/03/2021   VLDL 39 09/03/2021   LDLCALC 87 09/03/2021   LDLCALC 101 (H) 07/16/2018    Physical Findings:  Musculoskeletal: Strength & Muscle Tone: within normal limits Gait & Station: normal Patient leans: N/A  Psychiatric Specialty Exam:  Presentation  General Appearance: appears older than stated age, unkempt appearing but not malodorous   Eye Contact:Good   Speech:Clear and Coherent; Normal Rate   Speech Volume:Normal   Handedness:Right    Mood and Affect  Mood: described as "fine"- appears anxious  Affect: anxious   Thought Process  Thought Processes: circumstantial but redirectable   Descriptions of Associations:Intact   Orientation:Full (Time, Place and  Person)   Thought Content:Rumination; Perseveration (Perseverating and ruminative about dream content, possible body odor) Denies SI, HI, AVH, ideas of reference or first rank symptoms; no compulsions noted on exam; denies paranoia   Hallucinations:denies  Ideas of Reference:None   Suicidal Thoughts:denies  Homicidal Thoughts:denies   Sensorium  Memory: Some difficulty remembering what he has previously told the team in terms of history or concerns with some repeating of details  Judgment:Fair   Insight:Shallow; Lacking    Executive Functions  Concentration:Fair   Attention Span:Fair   [place] of Knowledge:Good   Language:Good    Psychomotor Activity  Psychomotor Activity:No visible  tremors noted today; no akathisias   Assets  Assets:Communication Skills; Desire for Improvement; Financial Resources/Insurance; Housing; Social Support    Sleep  Total time unrecorded   Physical Exam Vitals reviewed.  Constitutional:      General: He is not in acute distress. HENT:     Head: Normocephalic and atraumatic.     Mouth/Throat:     Mouth: Mucous membranes are moist.     Pharynx: Oropharynx is clear.  Neurological:     General: No focal deficit present.     Mental Status: He is alert.     Gait: Gait normal.    Review of Systems  Respiratory:  Negative for shortness of breath.   Cardiovascular:  Negative for chest pain.  Gastrointestinal:  Negative for nausea and vomiting.  Neurological:  Negative for tremors and headaches.   Blood pressure (!) 150/87, pulse 93, temperature 98 F (36.7 C), temperature source Oral, resp. rate 18, height 6' 3"  (1.905 m), weight 107.5 kg, SpO2 97 %. Body mass index is 29.62 kg/m.   Treatment Plan Summary: ASSESSMENT: Principal Problem: Major depressive disorder, recurrent, severe currently without psychotic features Mixed obsessional thoughts and acts by hx (r/o OCD) Social anxiety d/o by hx Hoarding  d/o by hx Nicotine use disorder Suicide attempt by drug overdose Memory loss (mild cognitive impairment by hx) Hypothyroidism Hypertension GERD History of Crohn's disease  Pancreatic insufficiency H/o hyponatremia  PLAN: Safety and Monitoring:             -- Involuntary admission to inpatient psychiatric unit for safety, stabilization and treatment             -- Daily contact with patient to assess and evaluate symptoms and progress in treatment             -- Patient's case to be discussed in multi-disciplinary team meeting             -- Observation Level : q15 minute checks             -- Vital signs:  q12 hours             -- Precautions: suicide, elopement, and assault   2. Psychiatric Diagnoses and Treatment:  #Major depressive disorder-recurrent, severe (currently without psychotic features) #Mixed obsessional thoughts and acts by hx (r/o OCD) #Social anxiety d/o by hx #Hoarding d/o by hx -Continue home BuSpar 30 mg twice daily - Increase Zachary [name] to 150 mg qhs and will likely discharge on this dosage should mood and lability stabilize until discharge - Continue home Lamictal 200 mg daily (verifies he has not missed doses and Lamictal level 2.2 on admission) - Holding Lithium - level less than 0.06 on admission and given OD history will not restart -patient confirmed that he was taking prior to admission, but for "dementia prophylaxis" and not mania - Continue home Zyprexa 15 mg nightly A1c 5.7; Lipids with TG 194; QTC 414m  #Memory loss (mild cognitive impairment by hx) -Continue home Aricept 10 mg nightly  #Nicotine use disorder - Continue Nicorette 2 mg as needed   3. Medical Diagnoses and Treatment: #Hypothyroidism -Continue home Synthroid 75 mcg daily  # Hypertension BP 150/87 -Continue home losartan 100 mg daily -- Clonidine 0.172mq8 hours PRN ABP >160 or DBP >110  # GERD -Continue home Protonix 40 mg daily  # History of Crohn's disease c/b fecal  incontinence -Continue home mesalamine 1500 mg daily -Discontinue budesonide 9 mg, as PCP note from 7/17 reported discontinuation  as fecal incontinence seemed to worsen on this medication.  # Pancreatic insufficiency -Continue home Creon 144,000 units 3 times daily with meals  #CVA prophylaxis 2/2 history of arrhythmia - Continue home enteric-coated aspirin 81 mg daily   #Nonspecific EKG changes/Incomplete RBBB - Sent to [name] and cleared from cardiac standpoint on 9/1 (troponin I 4) - f/u with outpatient PCP after discharge  #h/o hyponatremia - current Na+ 144 - holding Na+ tab at this time  #small blood in urine - currently asymptomatic - will need PCP f/u after discharge  #Elevated creatinine, resolved 1.12 (down from 1.51 4 days ago and 1.25 1-day ago) - Encouraged po hydration  #Hypokalemia, resolved - Received Klor 64mq 9/2 X1 -repeat potassium 3.7    4. Discharge Planning:              -- Social work and case management to assist with discharge planning and identification of hospital follow-up needs prior to discharge             -- Estimated LOS 5 to 7 days             -- Discharge Concerns: Need to establish a safety plan             -- Discharge Goals: Return home with outpatient referrals for mental health follow-up including medication management/psychotherapy    AFrance Ravens MD 09/04/2021, 1:40 PM

## 2021-09-04 NOTE — B[facility] Group Notes (Signed)
Spiritual care group on grief and loss facilitated by chaplain Janne Napoleon, [facility]   Group Goal:   Support / Education around grief and loss   Members engage in facilitated group support and psycho-social education.   Group Description:   Following introductions and group rules, group members engaged in facilitated group dialog and support around topic of loss, with particular support around experiences of loss in their lives. Group Identified types of loss (relationships / self / things) and identified patterns, circumstances, and changes that precipitate losses. Reflected on thoughts / feelings around loss, normalized grief responses, and recognized variety in grief experience. Group noted Worden's four tasks of grief in discussion.   Group drew on Adlerian / Rogerian, narrative, MI,   Patient Progress: Did not attend.

## 2021-09-04 NOTE — Group Note (Signed)
Recreation Therapy Group Note   Group Topic:Stress Management  Group Date: 09/04/2021 Start Time: 0930 End Time: 0945 Facilitators: Victorino Sparrow, LRT,CTRS Location: [address] Dayroom   Goal Area(s) Addresses:  Patient will actively participate in stress management techniques presented during session.  Patient will successfully identify benefit of practicing stress management post d/c.   Group Description:  Guided Imagery. LRT provided education, instruction, and demonstration on practice of visualization via guided imagery. Patient was asked to participate in the technique introduced during session. LRT debriefed including topics of mindfulness, stress management and specific scenarios each patient could use these techniques.  Patients were focused on the activity until the doctor interrupted the flow and mood of the session.    Affect/Mood: N/A   Participation Level: Did not attend    Clinical Observations/Individualized Feedback:     Plan: Continue to engage patient in RT group sessions 2-3x/week.   Victorino Sparrow, LRT,CTRS 09/04/2021 10:32 AM

## 2021-09-05 NOTE — Progress Notes (Signed)
D:  Patient's self inventory sheet, patient has fair sleep, did not  receive sleep medication.  Good appetite, normal energy level, good concentration.  Denied withdrawals. Stated he has had diarrhea, cramping, runny nose.  Denied SI.  Denied physical problems.  Physical pain, slight stomach cramping.  Goal is not to assume what the facial expressions/body language of others mean.  Be mindful of other people's expectations.  No discharge plans. A:  Medications administered per MD orders.  Emotional support and encouragement given patient. R:  Denied SI and HI, contracts for safety.  Denied A/V hallucination.

## 2021-09-05 NOTE — Progress Notes (Signed)
The patient's positive event for the day is that he found out that he will be discharged tomorrow. He rated his day as an 8 out of 10 and states that he enjoyed breakfast.

## 2021-09-05 NOTE — BHH Suicide Risk Assessment (Signed)
[facility] INPATIENT:  Family/Significant Other Suicide Prevention Education  Suicide Prevention Education:  Education Completed; Rudi Heap, friend, [phone] has been identified by the patient as the family member/significant other with whom the patient will be residing, and identified as the person(s) who will aid the patient in the event of a mental health crisis (suicidal ideations/suicide attempt).  With written consent from the patient, the family member/significant other has been provided the following suicide prevention education, prior to the and/or following the discharge of the patient.  In addition to the precautions below, patient's support agrees to monitor patient for safety, ensure treatment compliance, and alert emergency services should patient require such services.   Pilar Plate agrees to dispense medications on a 7 day basis, check in with patient by phone on a daily basis, and ensure all weapons are removed from the home when he takes the patient home from the hospital.   The suicide prevention education provided includes the following: Suicide risk factors Suicide prevention and interventions National Suicide Hotline telephone number [facility] assessment telephone number [facility] Emergency Assistance [place] and/or Residential Mobile Crisis Unit telephone number  Request made of family/significant other to: Remove weapons (e.g., guns, rifles, knives), all items previously/currently identified as safety concern.   Remove drugs/medications (over-the-counter, prescriptions, illicit drugs), all items previously/currently identified as a safety concern.  The family member/significant other verbalizes understanding of the suicide prevention education information provided.  The family member/significant other agrees to remove the items of safety concern listed above.  Durenda Hurt 09/05/2021, 4:08 PM

## 2021-09-05 NOTE — Progress Notes (Signed)
[facility] MD Progress Note  09/05/2021 9:06 AM THOR NANNINI  MRN:  [id number] Subjective: Zachary [name] is a 60 year old male with a past psychiatric history of major depressive disorder with psychotic features, mixed obsessional thoughts (r/o OCD) social anxiety, hoarding d/o, and mild cognitive impairment, and an extensive past medical history, who presented from [facility], ED after a suicide attempt in which he took 30-40 Luvox 100 mg tablets.  Of note, on chart review, patient reported suicidal ideation on 07/03/2021, which believed may have been due to discontinuation of budesonide.  On evaluation today: Patient is seen at the bedside, and reports feeling well. Denies SI/HI/AVH. Less ruminative today. Reports having no further concerns beyond discharge planning. Denies problems with sleep and appetite. Reports GI cramping and diarrhea but this is due to his Crohn's disease. Reports feeling ready for discharge should safety plan be established. Friend will be assisting patient with safety planning and patient agreeable to only medication administration.   Chart review: outpatient note per primary psychiatrist, Dr. Clovis Pu- July 2023 visit: [name] has been diagnosed with treatment resistant depression with psychotic features, OCD and social anxiety but schizoaffective may be more appropriate diagnosis given this chronic paranoid thinking.  Specifically he is chronically  suspicious that he has dementia despite neuropsychological testing which tells him he does not.  He also has chronic thoughts that other people find his odor offensive because of passing gas.  There is very little evidence to support this.  He has been under my psychiatric care for many many years and never owed noticed body odor and appointments at any time.  He also suspects that people are thinking negatively of him regularly.  Overall his paranoia and anxiety and depression are unchanged with reduction in olanzapine to 25m daily  months ago .  Cognitve complaints could be related to untreated OSA. Pending sleep study results. Hoarding worsened since Luvox reduced to 300 mg daily so increased to 400 mg daily in 2022. Buspirone 30 mg twice daily, Aricept 10 mg daily, fluvoxamine 400 mg daily, lamotrigine 200 mg daily, lithium 150 nightly, olanzapine 15 mg nightly. OK prn lorazepam usually less sedating than X anax 0.5-1.0 mg BID prn  Past psych med: Abilify, Zyprexa, Seroquel 600, olanzapine 30 Wellbutrin, nortriptyline, clomipramine, mirtazapine,  fluvoxamine 400, Lamotrigine, lithium no response,  stimulants, Aricept  buspirone 60,   Xanax Naltrexone for compulsive buying NR  Principal Problem: MDD (major depressive disorder), recurrent episode, severe ([place] Diagnosis: Principal Problem:   MDD (major depressive disorder), recurrent episode, severe ([place] Active Problems:   History of Crohn's disease   Social anxiety disorder   Essential hypertension   Pancreatic insufficiency   Memory loss   Mixed obsessional thoughts and acts   Total Time spent with patient: 30 minutes  Past Psychiatric History: See H&P  Past Medical History:  Past Medical History:  Diagnosis Date   Anxiety    Crohn disease ([place]    pt reports subsequent testing was normal   Depression    GERD (gastroesophageal reflux disease)    Herpes simplex    Hoarding disorder    Hyperlipidemia    Hypertension    IBS (irritable bowel syndrome)    Incontinence     Past Surgical History:  Procedure Laterality Date   APPENDECTOMY     COLONOSCOPY     POLYPECTOMY     TWISTED TESTICLES  1970   [place]    UMBILICAL HERNIA REPAIR     Family History:  Family History  Problem Relation Age of Onset   Arthritis Mother    Stroke Mother    Depression Mother    Hypertension Father    Prostate cancer Father 71   Breast cancer Maternal Grandmother    Cancer Maternal Grandfather        unknown type   Cancer Paternal Grandmother         unknown type   Lung cancer Maternal Aunt    Prostate cancer Paternal Uncle    Prostate cancer Paternal Uncle    Colon cancer Neg Hx    Rectal cancer Neg Hx    Stomach cancer Neg Hx    Esophageal cancer Neg Hx    Pancreatic cancer Neg Hx    Family Psychiatric  History: See H&P Social History:  Social History   Substance and Sexual Activity  Alcohol Use No     Social History   Substance and Sexual Activity  Drug Use No    Social History   Socioeconomic History   Marital status: Single    Spouse name: Not on file   Number of children: Not on file   Years of education: Not on file   Highest education level: Not on file  Occupational History   Not on file  Tobacco Use   Smoking status: Former    Types: Cigarettes    Quit date: 1980    Years since quitting: 43.7   Smokeless tobacco: Never  Vaping Use   Vaping Use: Never used  Substance and Sexual Activity   Alcohol use: No   Drug use: No   Sexual activity: Not Currently  Other Topics Concern   Not on file  Social History Narrative   Lives alone.     Social Determinants of Health   Financial Resource Strain: Not on file  Food Insecurity: Not on file  Transportation Needs: Not on file  Physical Activity: Not on file  Stress: Not on file  Social Connections: Not on file   Sleep: Fair  Appetite:  Fair  Current Medications: Current Facility-Administered Medications  Medication Dose Route Frequency Provider Last Rate Last Admin   alum & mag hydroxide-simeth (MAALOX/MYLANTA) 200-200-20 MG/5ML suspension 30 mL  30 mL Oral Q4H PRN Starkes-Perry, Gayland Curry, FNP       aspirin EC tablet 81 mg  81 mg Oral Daily Suella Broad, FNP   81 mg at 09/05/21 0729   busPIRone (BUSPAR) tablet 30 mg  30 mg Oral BID Suella Broad, FNP   30 mg at 09/05/21 0730   cloNIDine (CATAPRES) tablet 0.1 mg  0.1 mg Oral Q8H PRN Nelda Marseille, Amy E, MD       donepezil (ARICEPT) tablet 10 mg  10 mg Oral QHS Lavella Hammock, MD    10 mg at 09/04/21 2139   feeding supplement (ENSURE ENLIVE / ENSURE PLUS) liquid 237 mL  237 mL Oral BID BM Nelda Marseille, Amy E, MD   237 mL at 09/01/21 1017   fluvoxaMINE (LUVOX) tablet 150 mg  150 mg Oral QHS France Ravens, MD   150 mg at 09/04/21 2139   lamoTRIgine (LAMICTAL) tablet 200 mg  200 mg Oral Daily Suella Broad, FNP   200 mg at 09/05/21 0730   levothyroxine (SYNTHROID) tablet 75 mcg  75 mcg Oral Q0600 Suella Broad, FNP   75 mcg at 09/05/21 0614   lipase/protease/amylase (CREON) capsule 144,000 Units  144,000 Units Oral TID WC Harlow Asa, MD   144,000 Units at 09/05/21  0637   lipase/protease/amylase (CREON) capsule 72,000 Units  72,000 Units Oral PRN Harlow Asa, MD   72,000 Units at 09/04/21 1414   loperamide (IMODIUM) capsule 2 mg  2 mg Oral Q4H PRN Suella Broad, FNP       losartan (COZAAR) tablet 100 mg  100 mg Oral Daily Suella Broad, FNP   100 mg at 09/05/21 7564   magnesium hydroxide (MILK OF MAGNESIA) suspension 30 mL  30 mL Oral Daily PRN Starkes-Perry, Gayland Curry, FNP       melatonin tablet 3 mg  3 mg Oral QHS PRN Nelda Marseille, Amy E, MD       mesalamine (PENTASA) CR capsule 1,500 mg  1,500 mg Oral Daily Suella Broad, FNP   1,500 mg at 09/05/21 3329   nicotine polacrilex (NICORETTE) gum 2 mg  2 mg Oral PRN Lavella Hammock, MD       OLANZapine [facility]) tablet 15 mg  15 mg Oral QHS Suella Broad, FNP   15 mg at 09/04/21 2139   pantoprazole (PROTONIX) EC tablet 40 mg  40 mg Oral Daily Suella Broad, FNP   40 mg at 09/05/21 0731   traZODone (DESYREL) tablet 50 mg  50 mg Oral QHS PRN,MR X 1 Ajibola, Ene A, NP   50 mg at 09/04/21 2139    Lab Results:  No results found for this or any previous visit (from the past 48 hour(s)).   Blood Alcohol level:  Lab Results  Component Value Date   ETH <10 51/88/4166    Metabolic Disorder Labs: Lab Results  Component Value Date   HGBA1C 5.7 (H) 09/02/2021   MPG  116.89 09/02/2021   No results found for: "PROLACTIN" Lab Results  Component Value Date   CHOL 171 09/03/2021   TRIG 194 (H) 09/03/2021   HDL 45 09/03/2021   CHOLHDL 3.8 09/03/2021   VLDL 39 09/03/2021   LDLCALC 87 09/03/2021   LDLCALC 101 (H) 07/16/2018    Physical Findings:  Musculoskeletal: Strength & Muscle Tone: within normal limits Gait & Station: normal Patient leans: N/A  Psychiatric Specialty Exam:  Presentation  General Appearance: appears older than stated age, unkempt appearing but not malodorous   Eye Contact:Good   Speech:Clear and Coherent; Normal Rate   Speech Volume:Normal   Handedness:Right    Mood and Affect  Mood: described as "fine"- appears anxious  Affect: anxious   Thought Process  Thought Processes: circumstantial but redirectable   Descriptions of Associations:Intact   Orientation:Full (Time, Place and Person)   Thought Content:Rumination; Perseveration (Perseverating and ruminative about dream content, possible body odor) Denies SI, HI, AVH, ideas of reference or first rank symptoms; no compulsions noted on exam; denies paranoia   Hallucinations:denies  Ideas of Reference:None   Suicidal Thoughts:denies  Homicidal Thoughts:denies   Sensorium  Memory: Some difficulty remembering what he has previously told the team in terms of history or concerns with some repeating of details  Judgment:Fair   Insight:Shallow; Lacking    Executive Functions  Concentration:Fair   Attention Span:Fair   [place] of Knowledge:Good   Language:Good    Psychomotor Activity  Psychomotor Activity:No visible tremors noted today; no akathisias   Assets  Assets:Communication Skills; Desire for Improvement; Financial Resources/Insurance; Housing; Social Support    Sleep  Total time unrecorded   Physical Exam Vitals reviewed.  Constitutional:      General: He is not in acute distress. HENT:      Head:  Normocephalic and atraumatic.     Mouth/Throat:     Mouth: Mucous membranes are moist.     Pharynx: Oropharynx is clear.  Neurological:     General: No focal deficit present.     Mental Status: He is alert.     Gait: Gait normal.    Review of Systems  Respiratory:  Negative for shortness of breath.   Cardiovascular:  Negative for chest pain.  Gastrointestinal:  Negative for nausea and vomiting.  Neurological:  Negative for tremors and headaches.   Blood pressure (!) 127/94, pulse (!) 121, temperature 98.1 F (36.7 C), temperature source Oral, resp. rate 18, height 6' 3"  (1.905 m), weight 107.5 kg, SpO2 97 %. Body mass index is 29.62 kg/m.   Treatment Plan Summary: ASSESSMENT: Principal Problem: Major depressive disorder, recurrent, severe currently without psychotic features Mixed obsessional thoughts and acts by hx (r/o OCD) Social anxiety d/o by hx Hoarding d/o by hx Nicotine use disorder Suicide attempt by drug overdose Memory loss (mild cognitive impairment by hx) Hypothyroidism Hypertension GERD History of Crohn's disease  Pancreatic insufficiency H/o hyponatremia  PLAN: Safety and Monitoring:             -- Involuntary admission to inpatient psychiatric unit for safety, stabilization and treatment             -- Daily contact with patient to assess and evaluate symptoms and progress in treatment             -- Patient's case to be discussed in multi-disciplinary team meeting             -- Observation Level : q15 minute checks             -- Vital signs:  q12 hours             -- Precautions: suicide, elopement, and assault   2. Psychiatric Diagnoses and Treatment:  #Major depressive disorder-recurrent, severe (currently without psychotic features) #Mixed obsessional thoughts and acts by hx (r/o OCD) #Social anxiety d/o by hx #Hoarding d/o by hx -Continue home BuSpar 30 mg twice daily -Continue Luvox to 150 mg qhs and will likely discharge on this  dosage should mood and lability stabilize until discharge -Continue home Lamictal 200 mg daily (verifies he has not missed doses and Lamictal level 2.2 on admission) --Holding Lithium - level less than 0.06 on admission and given OD history will not restart -patient confirmed that he was taking prior to admission, but for "dementia prophylaxis" and not mania -Continue home Zyprexa 15 mg nightly A1c 5.7; Lipids with TG 194; QTC 422m  #Memory loss (mild cognitive impairment by hx) -Continue home Aricept 10 mg nightly  #Nicotine use disorder - Continue Nicorette 2 mg as needed   3. Medical Diagnoses and Treatment: #Hypothyroidism -Continue home Synthroid 75 mcg daily  # Hypertension BP 127/94 -Continue home losartan 100 mg daily -- Clonidine 0.124mq8 hours PRN ABP >160 or DBP >110  # GERD -Continue home Protonix 40 mg daily  # History of Crohn's disease c/b fecal incontinence -Continue home mesalamine 1500 mg daily -Discontinue budesonide 9 mg, as PCP note from 7/17 reported discontinuation as fecal incontinence seemed to worsen on this medication.  # Pancreatic insufficiency -Continue home Creon 144,000 units 3 times daily with meals  #CVA prophylaxis 2/2 history of arrhythmia - Continue home enteric-coated aspirin 81 mg daily   #Nonspecific EKG changes/Incomplete RBBB - Sent to ED and cleared from cardiac standpoint on 9/1 (troponin I  4) - f/u with outpatient PCP after discharge  #h/o hyponatremia - current Na+ 144 - holding Na+ tab at this time  #small blood in urine - currently asymptomatic - will need PCP f/u after discharge  #Elevated creatinine, resolved 1.12 (down from 1.51 4 days ago and 1.25 1-day ago) - Encouraged po hydration  #Hypokalemia, resolved - Received Klor 41mq 9/2 X1 -repeat potassium 3.7   4. Discharge Planning:              -- Social work and case management to assist with discharge planning and identification of hospital follow-up needs  prior to discharge             -- Estimated LOS 5 to 7 days             -- Discharge Concerns: Need to establish a safety plan             -- Discharge Goals: Return home with outpatient referrals for mental health follow-up including medication management/psychotherapy    AFrance Ravens MD 09/05/2021, 9:06 AM

## 2021-09-05 NOTE — BHH Group Notes (Signed)
Adult Psychoeducational Group Note  Date:  09/05/2021 Time:  9:53 AM  Group Topic/Focus:  Goals Group:   The focus of this group is to help patients establish daily goals to achieve during treatment and discuss how the patient can incorporate goal setting into their daily lives to aide in recovery.  Participation Level:  Did Not Attend  Kern Reap 09/05/2021, 9:53 AM

## 2021-09-05 NOTE — Group Note (Signed)
Recreation Therapy Group Note   Group Topic:Animal Assisted Therapy   Group Date: 09/05/2021 Start Time: 1430 End Time: 1515 Facilitators: Victorino Sparrow, LRT,CTRS Location: [address] Dayroom   Animal-Assisted Activity (AAA) Program Checklist/Progress Notes Patient Eligibility Criteria Checklist & Daily Group note for Rec Tx Intervention  AAA/T Program Assumption of Risk Form signed by Patient/ or Parent Legal Guardian Yes  Patient is free of allergies or severe asthma Yes  Patient reports no fear of animals Yes  Patient reports no history of cruelty to animals Yes  Patient understands his/her participation is voluntary Yes  Patient washes hands before animal contact Yes  Patient washes hands after animal contact Yes   Affect/Mood: Appropriate   Participation Level: Moderate   Participation Quality: Independent   Behavior: Appropriate    Clinical Observations/Individualized Feedback: Pt stopped by the dayroom for a few minutes to engage with the therapy dog.    Plan: Continue to engage patient in RT group sessions 2-3x/week.   Victorino Sparrow, Glennis Brink 09/05/2021 3:49 PM

## 2021-09-05 NOTE — Plan of Care (Signed)
Nurse discussed anxiety, depression and coping skills with patient.  

## 2021-09-05 NOTE — Progress Notes (Signed)
    09/05/21 2200  Psych Admission Type (Psych Patients Only)  Admission Status Involuntary  Psychosocial Assessment  Patient Complaints Anxiety  Eye Contact Fair  Facial Expression Anxious  Affect Anxious  Speech Logical/coherent  Interaction Assertive;Needy  Motor Activity Slow  Appearance/Hygiene Unremarkable  Behavior Characteristics Anxious;Irritable  Mood Irritable  Thought Process  Coherency WDL  Content WDL  Delusions None reported or observed  Perception WDL  Hallucination None reported or observed  Judgment Impaired  Confusion Mild  Danger to Self  Current suicidal ideation? Denies  Danger to Others  Danger to Others None reported or observed

## 2021-09-06 ENCOUNTER — Encounter (HOSPITAL_COMMUNITY): Payer: Self-pay

## 2021-09-06 DIAGNOSIS — F332 Major depressive disorder, recurrent severe without psychotic features: Secondary | ICD-10-CM

## 2021-09-06 MED ORDER — FLUVOXAMINE MALEATE 50 MG PO TABS: 150.0000 mg | ORAL_TABLET | Freq: Every day | ORAL | 0 refills | Status: DC

## 2021-09-06 NOTE — Progress Notes (Signed)
  [facility] Adult Case Management Discharge Plan :  Will you be returning to the same living situation after discharge:  Yes,  Patient to return to place of residence.  At discharge, do you have transportation home?: Yes,  Patient's friend to provide transportation from hospital.  Do you have the ability to pay for your medications: Yes,  [place] of information consent forms completed and in the chart;  Patient's signature needed at discharge.  Patient to Follow up at:  Follow-up Information     [facility]. Go on 10/02/2021.   Specialty: Behavioral Health Why: You have an appointment with [facility] on 10/02/21 at 4:30 pm for medication management services. Contact information: [address], [place], [place]. Go on 09/07/2021.   Why: You have an appointment for therapy services on 09/07/21 at 11:00 am.  This appointment will be held in person. Contact information: [address], [place], [place] [address] [phone]                 Next level of care provider has access to [place] and Suicide Prevention discussed: Yes,  SPE complted with patient and Rudi Heap, friend, [phone]. CSW completed SPE with patient. Discussed potential triggers leading to suicidal ideation in addition to coping skills one might use in order to delay and distract self from self harming behaviors. CSW encouraged patient to utilize emergency services if they felt unable to maintain their safety. SPE flyer provided to patient at this time. Pilar Plate agrees to dispense medications on a 7 day basis, check in with patient by phone on a daily basis, and ensure all weapons are removed from the home when he takes the patient home from the hospital   Has patient been referred to the Quitline?: Patient refused referral Tobacco  Use: Medium Risk (09/01/2021)   Patient History    Smoking Tobacco Use: Former    Smokeless Tobacco Use: Never    Passive Exposure: Not on file   Patient has been referred for addiction treatment: N/A Patient denies active substance use, UDS Negative for all, screened low risk during nursing admission (see [facility] quick tab).  Social History   Substance and Sexual Activity  Drug Use No   Social History   Substance and Sexual Activity  Alcohol Use No    Durenda Hurt, LCSWA 09/06/2021, 9:25 AM

## 2021-09-06 NOTE — BH IP Treatment Plan (Signed)
Interdisciplinary Treatment and Diagnostic Plan Update  09/06/2021 Time of Session: 9:30am  Zachary [name] MRN: [id number]  Principal Diagnosis: MDD (major depressive disorder), recurrent episode, severe ([place])  Secondary Diagnoses: Principal Problem:   MDD (major depressive disorder), recurrent episode, severe ([place]) Active Problems:   History of Crohn's disease   Social anxiety disorder   Essential hypertension   Pancreatic insufficiency   Memory loss   Mixed obsessional thoughts and acts   Current Medications:  Current Facility-Administered Medications  Medication Dose Route Frequency Provider Last Rate Last Admin   alum & mag hydroxide-simeth (MAALOX/MYLANTA) 200-200-20 MG/5ML suspension 30 mL  30 mL Oral Q4H PRN Starkes-Perry, Gayland Curry, FNP       aspirin EC tablet 81 mg  81 mg Oral Daily Suella Broad, FNP   81 mg at 09/06/21 0815   busPIRone (BUSPAR) tablet 30 mg  30 mg Oral BID Suella Broad, FNP   30 mg at 09/06/21 0815   cloNIDine (CATAPRES) tablet 0.1 mg  0.1 mg Oral Q8H PRN Viann Fish E, MD       donepezil (ARICEPT) tablet 10 mg  10 mg Oral QHS Lavella Hammock, MD   10 mg at 09/05/21 2103   feeding supplement (ENSURE ENLIVE / ENSURE PLUS) liquid 237 mL  237 mL Oral BID BM Nelda Marseille, Amy E, MD   237 mL at 09/01/21 1017   fluvoxaMINE (LUVOX) tablet 150 mg  150 mg Oral Aliene Altes, MD   150 mg at 09/05/21 2103   lamoTRIgine (LAMICTAL) tablet 200 mg  200 mg Oral Daily Suella Broad, FNP   200 mg at 09/06/21 0815   levothyroxine (SYNTHROID) tablet 75 mcg  75 mcg Oral Q0600 Suella Broad, FNP   75 mcg at 09/06/21 0616   lipase/protease/amylase (CREON) capsule 144,000 Units  144,000 Units Oral TID WC Harlow Asa, MD   144,000 Units at 09/06/21 5638   lipase/protease/amylase (CREON) capsule 72,000 Units  72,000 Units Oral PRN Harlow Asa, MD   72,000 Units at 09/05/21 2019   loperamide (IMODIUM) capsule 2 mg  2 mg Oral Q4H PRN  Suella Broad, FNP       losartan (COZAAR) tablet 100 mg  100 mg Oral Daily Suella Broad, FNP   100 mg at 09/06/21 0815   magnesium hydroxide (MILK OF MAGNESIA) suspension 30 mL  30 mL Oral Daily PRN Starkes-Perry, Gayland Curry, FNP       melatonin tablet 3 mg  3 mg Oral QHS PRN Nelda Marseille, Amy E, MD       mesalamine (PENTASA) CR capsule 1,500 mg  1,500 mg Oral Daily Suella Broad, FNP   1,500 mg at 09/05/21 0731   nicotine polacrilex (NICORETTE) gum 2 mg  2 mg Oral PRN Lavella Hammock, MD       OLANZapine [facility]) tablet 15 mg  15 mg Oral QHS Suella Broad, FNP   15 mg at 09/05/21 2102   pantoprazole (PROTONIX) EC tablet 40 mg  40 mg Oral Daily Suella Broad, FNP   40 mg at 09/06/21 0815   traZODone (DESYREL) tablet 50 mg  50 mg Oral QHS PRN,MR X 1 [place], Ene A, NP   50 mg at 09/06/21 0251   PTA Medications: Medications Prior to Admission  Medication Sig Dispense Refill Last Dose   aspirin 81 MG EC tablet Take 81 mg by mouth daily. Swallow whole.      budesonide (ENTOCORT EC) 3  MG 24 hr capsule TAKE 3 CAPSULES BY MOUTH EVERY DAY (Patient taking differently: Take 9 mg by mouth daily.) 90 capsule 5    busPIRone (BUSPAR) 30 MG tablet Take 1 tablet (30 mg total) by mouth 2 (two) times daily. 180 tablet 0    CREON 36000-114000 units CPEP capsule TAKE 4 CAPSULES BY MOUTH BEFORE A MEAL& 2 WITH EACH SNACK 1440 capsule 3    donepezil (ARICEPT) 10 MG tablet Take 1 tablet (10 mg total) by mouth at bedtime. 90 tablet 3    lamoTRIgine (LAMICTAL) 200 MG tablet Take 1 tablet (200 mg total) by mouth daily. 90 tablet 0    levothyroxine (SYNTHROID) 75 MCG tablet TAKE 1 TABLET(75 MCG) BY MOUTH DAILY 90 tablet 0    loperamide (IMODIUM) 2 MG capsule Take 1 capsule (2 mg total) by mouth every 4 (four) hours. As needed 60 capsule 2    LORazepam (ATIVAN) 0.5 MG tablet 1-2 daily as needed for anxiety 30 tablet 0    losartan (COZAAR) 100 MG tablet Take 1 tablet (100 mg total)  by mouth daily.      mesalamine (APRISO) 0.375 g 24 hr capsule Take 4 capsules (1.5 g total) by mouth daily. 120 capsule 11    OLANZapine (ZYPREXA) 15 MG tablet Take 1 tablet (15 mg total) by mouth at bedtime. 90 tablet 0    pantoprazole (PROTONIX) 40 MG tablet Take 1 tablet (40 mg total) by mouth daily at 12 noon.      sodium chloride 1 g tablet TAKE 1 TABLET(1 GRAM) BY MOUTH THREE TIMES DAILY (Patient taking differently: Take 1 g by mouth 3 (three) times daily.) 90 tablet 0     Patient Stressors: Other: Patient experienced guilt about something he thought he had done, but after assessment at the ED, he realized he doesn't know if he did it or not.    Patient Strengths: Active sense of humor  Average or above average intelligence  Capable of independent living  Motivation for treatment/growth  Physical Health  Supportive family/friends   Treatment Modalities: Medication Management, Group therapy, Case management,  1 to 1 session with clinician, Psychoeducation, Recreational therapy.   Physician Treatment Plan for Primary Diagnosis: MDD (major depressive disorder), recurrent episode, severe ([place]) Long Term Goal(s): Improvement in symptoms so as ready for discharge   Short Term Goals: Ability to identify and develop effective coping behaviors will improve Ability to maintain clinical measurements within normal limits will improve Compliance with prescribed medications will improve Ability to identify changes in lifestyle to reduce recurrence of condition will improve Ability to verbalize feelings will improve Ability to disclose and discuss suicidal ideas Ability to demonstrate self-control will improve  Medication Management: Evaluate patient's response, side effects, and tolerance of medication regimen.  Therapeutic Interventions: 1 to 1 sessions, Unit Group sessions and Medication administration.  Evaluation of Outcomes: Adequate for Discharge  Physician Treatment Plan for  Secondary Diagnosis: Principal Problem:   MDD (major depressive disorder), recurrent episode, severe ([place]) Active Problems:   History of Crohn's disease   Social anxiety disorder   Essential hypertension   Pancreatic insufficiency   Memory loss   Mixed obsessional thoughts and acts  Long Term Goal(s): Improvement in symptoms so as ready for discharge   Short Term Goals: Ability to identify and develop effective coping behaviors will improve Ability to maintain clinical measurements within normal limits will improve Compliance with prescribed medications will improve Ability to identify changes in lifestyle to reduce recurrence of condition  will improve Ability to verbalize feelings will improve Ability to disclose and discuss suicidal ideas Ability to demonstrate self-control will improve     Medication Management: Evaluate patient's response, side effects, and tolerance of medication regimen.  Therapeutic Interventions: 1 to 1 sessions, Unit Group sessions and Medication administration.  Evaluation of Outcomes: Adequate for Discharge   RN Treatment Plan for Primary Diagnosis: MDD (major depressive disorder), recurrent episode, severe ([place]) Long Term Goal(s): Knowledge of disease and therapeutic regimen to maintain health will improve  Short Term Goals: Ability to remain free from injury will improve, Ability to participate in decision making will improve, Ability to verbalize feelings will improve, Ability to disclose and discuss suicidal ideas, and Ability to identify and develop effective coping behaviors will improve  Medication Management: RN will administer medications as ordered by provider, will assess and evaluate patient's response and provide education to patient for prescribed medication. RN will report any adverse and/or side effects to prescribing provider.  Therapeutic Interventions: 1 on 1 counseling sessions, Psychoeducation, Medication administration, Evaluate  responses to treatment, Monitor vital signs and CBGs as ordered, Perform/monitor CIWA, COWS, AIMS and Fall Risk screenings as ordered, Perform wound care treatments as ordered.  Evaluation of Outcomes: Adequate for Discharge   LCSW Treatment Plan for Primary Diagnosis: MDD (major depressive disorder), recurrent episode, severe ([place]) Long Term Goal(s): Safe transition to appropriate next level of care at discharge, Engage patient in therapeutic group addressing interpersonal concerns.  Short Term Goals: Engage patient in aftercare planning with referrals and resources, Increase social support, Increase emotional regulation, Facilitate acceptance of mental health diagnosis and concerns, Identify triggers associated with mental health/substance abuse issues, and Increase skills for wellness and recovery  Therapeutic Interventions: Assess for all discharge needs, 1 to 1 time with Social worker, Explore available resources and support systems, Assess for adequacy in community support network, Educate family and significant other(s) on suicide prevention, Complete Psychosocial Assessment, Interpersonal group therapy.  Evaluation of Outcomes: Adequate for Discharge   Progress in Treatment: Attending groups: Yes. Participating in groups: Yes. Taking medication as prescribed: Yes. Toleration medication: Yes. Family/Significant other contact made: No, will contact:  CSM will assess Patient understands diagnosis: Yes. Discussing patient identified problems/goals with staff: Yes. Medical problems stabilized or resolved: No patient c/o stool leakage and has requested a room by himself Denies suicidal/homicidal ideation: Yes. Issues/concerns per patient self-inventory: No. Other:    New problem(s) identified: No, Describe:  None reported   New Short Term/Long Term Goal(s): detox, medication management for mood stabilization; elimination of SI thoughts; development of comprehensive mental  wellness/sobriety plan   Patient Goals:  Patient asserts that he wants to distinguish between perception and reality    Discharge Plan or Barriers: Patient will return home and will follow up with  [facility] for medication management and with [facility] for therapy.    Reason for Continuation of Hospitalization:  Medication stabilization    Estimated Length of Stay: Adequate for Discharge    Last [facility] Suicide Severity Risk Score: Flowsheet Row ED to Hosp-Admission (Current) from 08/31/2021 in [facility] ED to Hosp-Admission (Discharged) from 08/29/2021 in [place] ED from 07/21/2021 in [place] DEPT  C-SSRS RISK CATEGORY No Risk High Risk No Risk       Last PHQ 2/9 Scores:    07/03/2021    9:05 AM 01/17/2021    9:39 AM 09/13/2020    8:37 AM  Depression screen PHQ 2/9  Decreased Interest 1 0 0  Down, Depressed, Hopeless 1 0 0  PHQ - 2 Score 2 0 0  Altered sleeping 0 0 0  Tired, decreased energy 0 3 0  Change in appetite  0 0  Feeling bad or failure about yourself  1 0 0  Trouble concentrating 1 0 2  Moving slowly or fidgety/restless 1 0 2  Suicidal thoughts 1 0 0  PHQ-9 Score 6 3 4   Difficult doing work/chores Very difficult Not difficult at all Very difficult    Scribe for Treatment Team: Darleen Crocker, Latanya Presser 09/06/2021 8:41 AM

## 2021-09-06 NOTE — [facility] Suicide Risk Assessment (Signed)
[facility] Discharge Suicide Risk Assessment   Principal Problem: MDD (major depressive disorder), recurrent episode, severe ([place]) Discharge Diagnoses: Principal Problem:   MDD (major depressive disorder), recurrent episode, severe ([place]) Active Problems:   History of Crohn's disease   Social anxiety disorder   Essential hypertension   Pancreatic insufficiency   Memory loss   Mixed obsessional thoughts and acts   Reason for Admission: Zachary [name] is a 61 year old male with a past psychiatric history of major depressive disorder with psychotic features, mixed obsessional thoughts (r/o OCD) social anxiety, hoarding d/o, and mild cognitive impairment, and an extensive past medical history, who presented from [place], ED after a suicide attempt in which he took 30-40 Luvox 100 mg tablets.  Of note, on chart review, patient reported suicidal ideation on 07/03/2021, which believed may have been due to discontinuation of budesonide.  Hospital Summary During the patient's hospitalization, patient had extensive initial psychiatric evaluation, and follow-up psychiatric evaluations every day.  Psychiatric diagnoses provided upon initial assessment:  #Major depressive disorder-recurrent, severe (currently without psychotic features) #Mixed obsessional thoughts and acts by hx (r/o OCD) #Social anxiety d/o by hx #Hoarding d/o by hx #Memory loss (mild cognitive impairment by hx) #Nicotine use disorder  Patient's psychiatric medications were adjusted on admission:  -Restarted Luvox 100 mg po Q bedtime for depression/OCD.  Taper up as needed -Restarted Buspar 30 mg po bid for anxiety. -Restarted Trazodone 50 mg po Q bedtime prn for insomnia -Restarted Lamictal 200 mg daily for mood stabilization. -Restarted Olanzapine 15 mg po Q bedtime for mood control.  -Restarted Nicorette gum 2 mg prn for nicotine withdrawal.  -Restarted Aricept 10 mg hs for dementia  During the hospitalization, other  adjustments were made to the patient's psychiatric medication regimen:  -Increased Luvox to 150 mg qhs   Gradually, patient started adjusting to milieu.   Patient's care was discussed during the interdisciplinary team meeting every day during the hospitalization.  The patient denies having side effects to prescribed psychiatric medication.  The patient reports their target psychiatric symptoms of anxiety and depression responded well to the psychiatric medications, and the patient reports overall benefit other psychiatric hospitalization. Supportive psychotherapy was provided to the patient. The patient also participated in regular group therapy while admitted.   Labs were reviewed with the patient, and abnormal results were discussed with the patient.  The patient denied having suicidal thoughts more than 48 hours prior to discharge.  Patient denies having homicidal thoughts.  Patient denies having auditory hallucinations.  Patient denies any visual hallucinations.  Patient denies having paranoid thoughts.  The patient is able to verbalize their individual safety plan to this provider.  It is recommended to the patient to continue psychiatric medications as prescribed, after discharge from the hospital.    It is recommended to the patient to follow up with your outpatient psychiatric provider and PCP.  Discussed with the patient, the impact of alcohol, drugs, tobacco have been there overall psychiatric and medical wellbeing, and total abstinence from substance use was recommended the patient.   Total Time spent with patient: 45 minutes  Musculoskeletal: Strength & Muscle Tone: within normal limits Gait & Station: normal Patient leans: N/A  Psychiatric Specialty Exam  Presentation  General Appearance: Appropriate for Environment; Casual   Eye Contact:Good   Speech:Clear and Coherent; Normal Rate   Speech Volume:Normal   Handedness:Right    Mood and Affect   Mood:Euthymic   Duration of Depression Symptoms: No data recorded  Affect:Full Range; Congruent  Thought Process  Thought Processes:Coherent; Goal Directed   Descriptions of Associations:Intact   Orientation:Full (Time, Place and Person)   Thought Content:Logical   History of Schizophrenia/Schizoaffective disorder:No   Duration of Psychotic Symptoms:N/A   Hallucinations:Hallucinations: None  Ideas of Reference:None   Suicidal Thoughts:Suicidal Thoughts: No  Homicidal Thoughts:Homicidal Thoughts: No   Sensorium  Memory:Immediate Good; Recent Good; Remote Good   Judgment:Fair   Insight:Fair    Executive Functions  Concentration:Fair   Attention Span:Fair   [place]    Psychomotor Activity  Psychomotor Activity:Psychomotor Activity: Normal   Assets  Assets:Communication Skills; Desire for Improvement; Financial Resources/Insurance; Housing; Social Support    Sleep  Sleep:Sleep: Good   Physical Exam: Physical Exam Review of Systems  Respiratory:  Negative for shortness of breath.   Cardiovascular:  Negative for chest pain.  Gastrointestinal:  Positive for diarrhea. Negative for abdominal pain, constipation, heartburn, nausea and vomiting.  Neurological:  Negative for headaches.   Blood pressure (!) 131/98, pulse (!) 116, temperature 98.1 F (36.7 C), temperature source Oral, resp. rate 18, height 6' 3"  (1.905 m), weight 107.5 kg, SpO2 97 %. Body mass index is 29.62 kg/m.  Mental Status Per Nursing Assessment::   On Admission:  NA  Demographic Factors:  Male, Caucasian, and Living alone  Loss Factors: NA  Historical Factors: Impulsivity  Risk Reduction Factors:   Employed, Positive social support, Positive therapeutic relationship, and Positive coping skills or problem solving skills  Continued Clinical Symptoms:  Severe Anxiety and/or Agitation Depression:    Severe More than one psychiatric diagnosis  Cognitive Features That Contribute To Risk:  None    Suicide Risk:  Mild:  Suicidal ideation of limited frequency, intensity, duration, and specificity.  There are no identifiable plans, no associated intent, mild dysphoria and related symptoms, good self-control (both objective and subjective assessment), few other risk factors, and identifiable protective factors, including available and accessible social support.   Follow-up Information     [facility]. Go on 10/02/2021.   Specialty: Behavioral Health Why: You have an appointment with [facility] on 10/02/21 at 4:30 pm for medication management services. Contact information: [address] [place], [place]. Go on 09/07/2021.   Why: You have an appointment for therapy services on 09/07/21 at 11:00 am.  This appointment will be held in person. Contact information: [address] [place], [place] [address] [phone]                 Plan Of Care/Follow-up recommendations:  Activity: as tolerated  Diet: heart healthy  Other: -Follow-up with your outpatient psychiatric provider -instructions on appointment date, time, and address (location) are provided to you in discharge paperwork.  -Take your psychiatric medications as prescribed at discharge - instructions are provided to you in the discharge paperwork  -Follow-up with outpatient primary care doctor and other specialists -for management of chronic medical disease, including: hypothyroidism, hypertension, GERD, crohn's disease, pancreatic insufficiency, CVA prophylaxis, history of hyponatremia  -Testing: Follow-up with outpatient provider for abnormal lab results: repeat urinalysis (little hgb in urine), repeat bmp (Na 144 this admission, K 2.2, repleted)  -Recommend abstinence from alcohol,  tobacco, and other illicit drug use at discharge.   -If your psychiatric symptoms recur, worsen, or if you have side effects to your psychiatric medications, call your outpatient psychiatric provider, 911, [phone] or  go to the nearest emergency department.  -If suicidal thoughts recur, call your outpatient psychiatric provider, 911, [phone] or go to the nearest emergency department.   France Ravens, MD 09/06/2021, 7:29 AM

## 2021-09-06 NOTE — Progress Notes (Signed)
RN met with pt and reviewed pt's discharge instructions.  Pt verbalized understanding of discharge instructions and pt did not have any questions. RN reviewed and provided pt with a copy of SRA, AVS and Transition Record.  RN returned pt's belongings to pt.  Pt denied SI/HI/AVH and voiced no concerns.  Pt was appreciative of the care pt received at [facility].  Patient discharged to the lobby without incident. 

## 2021-09-06 NOTE — Discharge Summary (Signed)
Physician Discharge Summary Note  Patient:  Zachary [name] is an 61 y.o., male MRN:  [id number] DOB:  1960-09-01 Patient phone:  [phone] (home)  Patient address:   [place] [address],  Total Time spent with patient: 45 minutes  Date of Admission:  08/31/2021 Date of Discharge: 09/06/2021  Reason for Admission:  Zachary [name] is a 61 year old male with a past psychiatric history of major depressive disorder with psychotic features, mixed obsessional thoughts (r/o OCD) social anxiety, hoarding d/o, and mild cognitive impairment, and an extensive past medical history, who presented from [facility], ED after a suicide attempt in which he took 30-40 Luvox 100 mg tablets.  Of note, on chart review, patient reported suicidal ideation on 07/03/2021, which believed may have been due to discontinuation of budesonide.  Principal Problem: MDD (major depressive disorder), recurrent episode, severe ([place]) Discharge Diagnoses: Principal Problem:   MDD (major depressive disorder), recurrent episode, severe ([place]) Active Problems:   History of Crohn's disease   Social anxiety disorder   Essential hypertension   Pancreatic insufficiency   Memory loss   Mixed obsessional thoughts and acts    Past Psychiatric History: see H&P (and below)   Past Medical History:  Past Medical History:  Diagnosis Date   Anxiety    Crohn disease ([place])    pt reports subsequent testing was normal   Depression    GERD (gastroesophageal reflux disease)    Herpes simplex    Hoarding disorder    Hyperlipidemia    Hypertension    IBS (irritable bowel syndrome)    Incontinence     Past Surgical History:  Procedure Laterality Date   APPENDECTOMY     COLONOSCOPY     POLYPECTOMY     TWISTED TESTICLES  1970   [place]    UMBILICAL HERNIA REPAIR     Family History:  Family History  Problem Relation Age of Onset   Arthritis Mother    Stroke Mother    Depression Mother    Hypertension  Father    Prostate cancer Father 25   Breast cancer Maternal Grandmother    Cancer Maternal Grandfather        unknown type   Cancer Paternal Grandmother        unknown type   Lung cancer Maternal Aunt    Prostate cancer Paternal Uncle    Prostate cancer Paternal Uncle    Colon cancer Neg Hx    Rectal cancer Neg Hx    Stomach cancer Neg Hx    Esophageal cancer Neg Hx    Pancreatic cancer Neg Hx     Social History:  Social History   Substance and Sexual Activity  Alcohol Use No     Social History   Substance and Sexual Activity  Drug Use No    Social History   Socioeconomic History   Marital status: Single    Spouse name: Not on file   Number of children: Not on file   Years of education: Not on file   Highest education level: Not on file  Occupational History   Not on file  Tobacco Use   Smoking status: Former    Types: Cigarettes    Quit date: 1980    Years since quitting: 43.7   Smokeless tobacco: Never  Vaping Use   Vaping Use: Never used  Substance and Sexual Activity   Alcohol use: No   Drug use: No   Sexual activity: Not Currently  Other Topics  Concern   Not on file  Social History Narrative   Lives alone.     Social Determinants of Health   Financial Resource Strain: Not on file  Food Insecurity: Not on file  Transportation Needs: Not on file  Physical Activity: Not on file  Stress: Not on file  Social Connections: Not on file    Hospital Course:   During the patient's hospitalization, patient had extensive initial psychiatric evaluation, and follow-up psychiatric evaluations every day.   Psychiatric diagnoses provided upon initial assessment:  #Major depressive disorder-recurrent, severe (currently without psychotic features) #Mixed obsessional thoughts and acts by hx (r/o OCD) #Social anxiety d/o by hx #Hoarding d/o by hx #Memory loss (mild cognitive impairment by hx) #Nicotine use disorder   Patient's psychiatric medications were  adjusted on admission:  -Restarted Luvox 100 mg po Q bedtime for depression/OCD.  Taper up as needed -Restarted Buspar 30 mg po bid for anxiety. -Restarted Trazodone 50 mg po Q bedtime prn for insomnia -Restarted Lamictal 200 mg daily for mood stabilization. -Restarted Olanzapine 15 mg po Q bedtime for mood control.  -Restarted Nicorette gum 2 mg prn for nicotine withdrawal.  -Restarted Aricept 10 mg hs for dementia   During the hospitalization, other adjustments were made to the patient's psychiatric medication regimen:  -Increased Luvox to 150 mg qhs    Gradually, patient started adjusting to milieu.   Patient's care was discussed during the interdisciplinary team meeting every day during the hospitalization.   The patient denies having side effects to prescribed psychiatric medication.   The patient reports their target psychiatric symptoms of anxiety and depression responded well to the psychiatric medications, and the patient reports overall benefit other psychiatric hospitalization. Supportive psychotherapy was provided to the patient. The patient also participated in regular group therapy while admitted.    Labs were reviewed with the patient, and abnormal results were discussed with the patient.   The patient denied having suicidal thoughts more than 48 hours prior to discharge.  Patient denies having homicidal thoughts.  Patient denies having auditory hallucinations.  Patient denies any visual hallucinations.  Patient denies having paranoid thoughts.   The patient is able to verbalize their individual safety plan to this provider.   It is recommended to the patient to continue psychiatric medications as prescribed, after discharge from the hospital.     It is recommended to the patient to follow up with your outpatient psychiatric provider and PCP.   Discussed with the patient, the impact of alcohol, drugs, tobacco have been there overall psychiatric and medical wellbeing, and  total abstinence from substance use was recommended the patient.     Physical Findings:  Musculoskeletal: Strength & Muscle Tone: within normal limits Gait & Station: normal Patient leans: N/A   Psychiatric Specialty Exam:  Presentation  General Appearance: Appropriate for Environment; Casual   Eye Contact:Good   Speech:Clear and Coherent; Normal Rate   Speech Volume:Normal   Handedness:Right    Mood and Affect  Mood:Euthymic   Affect:Full Range; Congruent    Thought Process  Thought Processes:Coherent; Goal Directed   Descriptions of Associations:Intact   Orientation:Full (Time, Place and Person)   Thought Content:Logical   History of Schizophrenia/Schizoaffective disorder:No   Duration of Psychotic Symptoms:N/A   Hallucinations:Hallucinations: None   Ideas of Reference:None   Suicidal Thoughts:Suicidal Thoughts: No   Homicidal Thoughts:Homicidal Thoughts: No    Sensorium  Memory:Immediate Good; Recent Good; Remote Good   Judgment:Fair   Insight:Fair    Community education officer  Concentration:Fair   Attention Span:Fair   [place]    Psychomotor Activity  Psychomotor Activity:Psychomotor Activity: Normal    Assets  Assets:Communication Skills; Desire for Improvement; Financial Resources/Insurance; Housing; Social Support    Sleep  Sleep:Sleep: Good     Physical Exam: Physical Exam Review of Systems  Respiratory:  Negative for shortness of breath.   Cardiovascular:  Negative for chest pain.  Gastrointestinal:  Positive for diarrhea. Negative for abdominal pain, constipation, heartburn, nausea and vomiting.  Neurological:  Negative for headaches.   Blood pressure (!) 131/98, pulse (!) 116, temperature 98.1 F (36.7 C), temperature source Oral, resp. rate 18, height 6' 3"  (1.905 m), weight 107.5 kg, SpO2 97 %. Body mass index is 29.62 kg/m.   Social  History   Tobacco Use  Smoking Status Former   Types: Cigarettes   Quit date: 1980   Years since quitting: 43.7  Smokeless Tobacco Never   Tobacco Cessation:  N/A, patient does not currently use tobacco products   Blood Alcohol level:  Lab Results  Component Value Date   ETH <10 24/23/5361    Metabolic Disorder Labs:  Lab Results  Component Value Date   HGBA1C 5.7 (H) 09/02/2021   MPG 116.89 09/02/2021   No results found for: "PROLACTIN" Lab Results  Component Value Date   CHOL 171 09/03/2021   TRIG 194 (H) 09/03/2021   HDL 45 09/03/2021   CHOLHDL 3.8 09/03/2021   VLDL 39 09/03/2021   LDLCALC 87 09/03/2021   LDLCALC 101 (H) 07/16/2018    See Psychiatric Specialty Exam and Suicide Risk Assessment completed by Attending Physician prior to discharge.  Discharge destination:  Home  Is patient on multiple antipsychotic therapies at discharge:  No   Has Patient had three or more failed trials of antipsychotic monotherapy by history:  No  Recommended Plan for Multiple Antipsychotic Therapies: NA   Allergies as of 09/06/2021       Reactions   Erythromycin Other (See Comments)   Patient said he is "allergic," but the reaction was not cited   Penicillins Other (See Comments)   "It may kill me"   Tetracyclines & Related Hives        Medication List     STOP taking these medications    LORazepam 0.5 MG tablet Commonly known as: ATIVAN       TAKE these medications      Indication  aspirin EC 81 MG tablet Take 81 mg by mouth daily. Swallow whole.  Indication: Disease involving Lipid Deposits in the Arteries   budesonide 3 MG 24 hr capsule Commonly known as: ENTOCORT EC TAKE 3 CAPSULES BY MOUTH EVERY DAY  Indication: Crohn's Disease   busPIRone 30 MG tablet Commonly known as: BUSPAR Take 1 tablet (30 mg total) by mouth 2 (two) times daily.  Indication: Anxiety Disorder   Creon 36000 UNITS Cpep capsule Generic drug: lipase/protease/amylase TAKE 4  CAPSULES BY MOUTH BEFORE A MEAL& 2 WITH EACH SNACK  Indication: Pancreatic Insufficiency   donepezil 10 MG tablet Commonly known as: ARICEPT Take 1 tablet (10 mg total) by mouth at bedtime.  Indication: Dementia Prophylaxis   fluvoxaMINE 50 MG tablet Commonly known as: LUVOX Take 3 tablets (150 mg total) by mouth at bedtime.  Indication: Obsessive Compulsive Disorder   lamoTRIgine 200 MG tablet Commonly known as: LAMICTAL Take 1 tablet (200 mg total) by mouth daily.  Indication: Manic-Depression   levothyroxine 75 MCG tablet Commonly known  as: SYNTHROID TAKE 1 TABLET(75 MCG) BY MOUTH DAILY  Indication: Underactive Thyroid   loperamide 2 MG capsule Commonly known as: IMODIUM Take 1 capsule (2 mg total) by mouth every 4 (four) hours. As needed  Indication: Diarrhea   losartan 100 MG tablet Commonly known as: COZAAR Take 1 tablet (100 mg total) by mouth daily.  Indication: High Blood Pressure Disorder   mesalamine 0.375 g 24 hr capsule Commonly known as: Apriso Take 4 capsules (1.5 g total) by mouth daily.    OLANZapine 15 MG tablet Commonly known as: ZYPREXA Take 1 tablet (15 mg total) by mouth at bedtime.  Indication: Major Depressive Disorder   pantoprazole 40 MG tablet Commonly known as: PROTONIX Take 1 tablet (40 mg total) by mouth daily at 12 noon.  Indication: Heartburn   sodium chloride 1 g tablet TAKE 1 TABLET(1 GRAM) BY MOUTH THREE TIMES DAILY What changed: See the new instructions.  Indication: Electrolyte Disorder        Follow-up Information     [facility]. Go on 10/02/2021.   Specialty: Behavioral Health Why: You have an appointment with [facility] on 10/02/21 at 4:30 pm for medication management services. Contact information: [address], [place], [place]. Go on 09/07/2021.   Why: You have an appointment for therapy services  on 09/07/21 at 11:00 am.  This appointment will be held in person. Contact information: [place], [place], [place] [address] [phone]                 Follow-up recommendations:   Activity:  as tolerated Diet:  heart healthy   Comments:  Prescriptions were given at discharge.  Patient is agreeable with the discharge plan.  Patient was given an opportunity to ask questions.  Patient appears to feel comfortable with discharge and denies any current suicidal or homicidal thoughts.    Patient is instructed prior to discharge to: Take all medications as prescribed by mental healthcare provider. Report any adverse effects and or reactions from the medicines to outpatient provider promptly. In the event of worsening symptoms, patient is instructed to call the crisis hotline, 911 and or go to the nearest ED for appropriate evaluation and treatment of symptoms. Patient is to follow-up with primary care provider for other medical issues, concerns and or health care needs.   Signed: France Ravens, MD 09/06/2021, 8:51 AM

## 2021-09-07 ENCOUNTER — Telehealth: Payer: Self-pay | Admitting: Family Medicine

## 2021-09-07 MED FILL — Olanzapine Tab 15 MG: ORAL | Qty: 1 | Status: AC

## 2021-09-07 NOTE — Telephone Encounter (Signed)
Transition Care Management Follow-up Telephone Call Date of discharge and from where: 09/06/2021 [facility] How have you been since you were released from the hospital? Better but feeling faint Any questions or concerns? Yes Pt states that they found lots of health issues while in behavioral health Items Reviewed: Did the pt receive and understand the discharge instructions provided? Yes  Medications obtained and verified? Yes  Other? No  Any new allergies since your discharge? No  Dietary orders reviewed? No Do you have support at home? No   Follow up appointments reviewed: PCP Hospital f/u appt confirmed? Yes  Scheduled to see JCL on 09/08/2021 @ 11:15. [facility] f/u appt confirmed?[facility] Are transportation arrangements needed? No  If their condition worsens, is the pt aware to call PCP or go to the Emergency Dept.? Yes Was the patient provided with contact information for the PCP's office or ED? Yes Was to pt encouraged to call back with questions or concerns? Yes

## 2021-09-08 ENCOUNTER — Other Ambulatory Visit: Payer: Self-pay | Admitting: Family Medicine

## 2021-09-08 ENCOUNTER — Ambulatory Visit: Payer: 59 | Admitting: Family Medicine

## 2021-09-08 ENCOUNTER — Other Ambulatory Visit: Payer: Self-pay | Admitting: Psychiatry

## 2021-09-08 ENCOUNTER — Other Ambulatory Visit: Payer: Self-pay | Admitting: Gastroenterology

## 2021-09-08 ENCOUNTER — Encounter: Payer: Self-pay | Admitting: Family Medicine

## 2021-09-08 VITALS — Temp 97.5°F | Wt 242.6 lb

## 2021-09-08 DIAGNOSIS — F3341 Major depressive disorder, recurrent, in partial remission: Secondary | ICD-10-CM

## 2021-09-08 DIAGNOSIS — R42 Dizziness and giddiness: Secondary | ICD-10-CM | POA: Diagnosis not present

## 2021-09-08 DIAGNOSIS — E039 Hypothyroidism, unspecified: Secondary | ICD-10-CM

## 2021-09-08 DIAGNOSIS — R197 Diarrhea, unspecified: Secondary | ICD-10-CM

## 2021-09-08 DIAGNOSIS — K5080 Crohn's disease of both small and large intestine without complications: Secondary | ICD-10-CM

## 2021-09-08 NOTE — Progress Notes (Signed)
   Subjective:    Patient ID: Zachary [name], male    DOB: [birth year], 61 y.o.   MRN: [id number]  HPI He is here for consult concerning dizziness that usually occurs only when he stands and moves around and is noted that this occurs usually when he is dehydrated.  No blurred vision, double vision, nausea or vomiting.   Review of Systems     Objective:   Physical Exam Alert and in no distress.  Cardiac exam shows regular rhythm without murmurs or gallops.  Lungs clear to auscultation.  Blood pressure is normal.       Assessment & Plan:  I explained that I thought this was probably hydration related and make sure he keeps himself hydrated especially since this is only occurred twice in the last several weeks.

## 2021-09-08 NOTE — Telephone Encounter (Signed)
Pt has upcoming appt and was filled for 3 months back in July.

## 2021-09-09 ENCOUNTER — Other Ambulatory Visit: Payer: Self-pay | Admitting: Psychiatry

## 2021-09-09 DIAGNOSIS — G3184 Mild cognitive impairment, so stated: Secondary | ICD-10-CM

## 2021-09-18 ENCOUNTER — Ambulatory Visit: Payer: 59 | Admitting: Family Medicine

## 2021-09-18 VITALS — BP 110/70 | HR 102 | Temp 98.1°F | Wt 252.0 lb

## 2021-09-18 DIAGNOSIS — Z23 Encounter for immunization: Secondary | ICD-10-CM

## 2021-09-18 DIAGNOSIS — R8290 Unspecified abnormal findings in urine: Secondary | ICD-10-CM

## 2021-09-18 NOTE — Progress Notes (Signed)
   Subjective:    Patient ID: Zachary [name], male    DOB: 03-19-1960, 61 y.o.   MRN: [id number]  HPI He is here for recheck.  He was told that he had blood in his urine while he was in the psych hospital.  The record was reviewed and it did show possible hemoglobin in there but we could not get a urine before he left.  There was a misinterpretation of the results.  There was supposed to be a reflex microscopic but I did not see it.  He recently had evaluation for palpitations which turned out nothing.   Review of Systems     Objective:   Physical Exam Alert and in no distress otherwise not examined       Assessment & Plan:  Abnormal urine  Need for influenza vaccination - Plan: Flu Vaccine QUAD 25moIM (Fluarix, Fluzone & Alfiuria Quad PF) He got his flu shot and they contacted him to come back to give a specimen but the cell phone went right into voicemail.

## 2021-09-20 ENCOUNTER — Ambulatory Visit: Payer: 59 | Admitting: Family Medicine

## 2021-09-20 ENCOUNTER — Encounter: Payer: Self-pay | Admitting: Family Medicine

## 2021-09-20 VITALS — BP 142/90 | HR 81 | Temp 97.5°F | Ht 74.0 in | Wt 245.8 lb

## 2021-09-20 DIAGNOSIS — Z8042 Family history of malignant neoplasm of prostate: Secondary | ICD-10-CM

## 2021-09-20 DIAGNOSIS — K8689 Other specified diseases of pancreas: Secondary | ICD-10-CM

## 2021-09-20 DIAGNOSIS — K50818 Crohn's disease of both small and large intestine with other complication: Secondary | ICD-10-CM | POA: Diagnosis not present

## 2021-09-20 DIAGNOSIS — F429 Obsessive-compulsive disorder, unspecified: Secondary | ICD-10-CM | POA: Diagnosis not present

## 2021-09-20 DIAGNOSIS — Z209 Contact with and (suspected) exposure to unspecified communicable disease: Secondary | ICD-10-CM

## 2021-09-20 DIAGNOSIS — G4733 Obstructive sleep apnea (adult) (pediatric): Secondary | ICD-10-CM | POA: Diagnosis not present

## 2021-09-20 DIAGNOSIS — Z9189 Other specified personal risk factors, not elsewhere classified: Secondary | ICD-10-CM

## 2021-09-20 DIAGNOSIS — Z0000 Encounter for general adult medical examination without abnormal findings: Secondary | ICD-10-CM | POA: Diagnosis not present

## 2021-09-20 DIAGNOSIS — I10 Essential (primary) hypertension: Secondary | ICD-10-CM

## 2021-09-20 DIAGNOSIS — E669 Obesity, unspecified: Secondary | ICD-10-CM

## 2021-09-20 DIAGNOSIS — R8290 Unspecified abnormal findings in urine: Secondary | ICD-10-CM

## 2021-09-20 DIAGNOSIS — R159 Full incontinence of feces: Secondary | ICD-10-CM

## 2021-09-20 DIAGNOSIS — N3281 Overactive bladder: Secondary | ICD-10-CM

## 2021-09-20 DIAGNOSIS — F332 Major depressive disorder, recurrent severe without psychotic features: Secondary | ICD-10-CM

## 2021-09-20 DIAGNOSIS — J302 Other seasonal allergic rhinitis: Secondary | ICD-10-CM

## 2021-09-20 DIAGNOSIS — Z79899 Other long term (current) drug therapy: Secondary | ICD-10-CM

## 2021-09-20 DIAGNOSIS — E039 Hypothyroidism, unspecified: Secondary | ICD-10-CM

## 2021-09-20 LAB — POCT URINALYSIS DIP (PROADVANTAGE DEVICE)
Bilirubin, UA: NEGATIVE
Blood, UA: NEGATIVE
Glucose, UA: NEGATIVE mg/dL
Ketones, POC UA: NEGATIVE mg/dL
Leukocytes, UA: NEGATIVE
Nitrite, UA: NEGATIVE
Protein Ur, POC: NEGATIVE mg/dL
Specific Gravity, Urine: 1.005
Urobilinogen, Ur: 0.2
pH, UA: 6.5 (ref 5.0–8.0)

## 2021-09-20 NOTE — Patient Instructions (Signed)
Health Maintenance, Male Adopting a healthy lifestyle and getting preventive care are important in promoting health and wellness. Ask your health care provider about: The right schedule for you to have regular tests and exams. Things you can do on your own to prevent diseases and keep yourself healthy. What should I know about diet, weight, and exercise? Eat a healthy diet  Eat a diet that includes plenty of vegetables, fruits, low-fat dairy products, and lean protein. Do not eat a lot of foods that are high in solid fats, added sugars, or sodium. Maintain a healthy weight Body mass index (BMI) is a measurement that can be used to identify possible weight problems. It estimates body fat based on height and weight. Your health care provider can help determine your BMI and help you achieve or maintain a healthy weight. Get regular exercise Get regular exercise. This is one of the most important things you can do for your health. Most adults should: Exercise for at least 150 minutes each week. The exercise should increase your heart rate and make you sweat (moderate-intensity exercise). Do strengthening exercises at least twice a week. This is in addition to the moderate-intensity exercise. Spend less time sitting. Even light physical activity can be beneficial. Watch cholesterol and blood lipids Have your blood tested for lipids and cholesterol at 59 years of age, then have this test every 5 years. You may need to have your cholesterol levels checked more often if: Your lipid or cholesterol levels are high. You are older than 59 years of age. You are at high risk for heart disease. What should I know about cancer screening? Many types of cancers can be detected early and may often be prevented. Depending on your health history and family history, you may need to have cancer screening at various ages. This may include screening for: Colorectal cancer. Prostate cancer. Skin cancer. Lung  cancer. What should I know about heart disease, diabetes, and high blood pressure? Blood pressure and heart disease High blood pressure causes heart disease and increases the risk of stroke. This is more likely to develop in people who have high blood pressure readings or are overweight. Talk with your health care provider about your target blood pressure readings. Have your blood pressure checked: Every 3-5 years if you are 18-39 years of age. Every year if you are 40 years old or older. If you are between the ages of 65 and 75 and are a current or former smoker, ask your health care provider if you should have a one-time screening for abdominal aortic aneurysm (AAA). Diabetes Have regular diabetes screenings. This checks your fasting blood sugar level. Have the screening done: Once every three years after age 45 if you are at a normal weight and have a low risk for diabetes. More often and at a younger age if you are overweight or have a high risk for diabetes. What should I know about preventing infection? Hepatitis B If you have a higher risk for hepatitis B, you should be screened for this virus. Talk with your health care provider to find out if you are at risk for hepatitis B infection. Hepatitis C Blood testing is recommended for: Everyone born from 1945 through 1965. Anyone with known risk factors for hepatitis C. Sexually transmitted infections (STIs) You should be screened each year for STIs, including gonorrhea and chlamydia, if: You are sexually active and are younger than 59 years of age. You are older than 59 years of age and your   health care provider tells you that you are at risk for this type of infection. Your sexual activity has changed since you were last screened, and you are at increased risk for chlamydia or gonorrhea. Ask your health care provider if you are at risk. Ask your health care provider about whether you are at high risk for HIV. Your health care provider  may recommend a prescription medicine to help prevent HIV infection. If you choose to take medicine to prevent HIV, you should first get tested for HIV. You should then be tested every 3 months for as long as you are taking the medicine. Follow these instructions at home: Alcohol use Do not drink alcohol if your health care provider tells you not to drink. If you drink alcohol: Limit how much you have to 0-2 drinks a day. Know how much alcohol is in your drink. In the [place] one drink equals one 12 oz bottle of beer (355 mL), one 5 oz glass of wine (148 mL), or one 1 oz glass of hard liquor (44 mL). Lifestyle Do not use any products that contain nicotine or tobacco. These products include cigarettes, chewing tobacco, and vaping devices, such as e-cigarettes. If you need help quitting, ask your health care provider. Do not use street drugs. Do not share needles. Ask your health care provider for help if you need support or information about quitting drugs. General instructions Schedule regular health, dental, and eye exams. Stay current with your vaccines. Tell your health care provider if: You often feel depressed. You have ever been abused or do not feel safe at home. Summary Adopting a healthy lifestyle and getting preventive care are important in promoting health and wellness. Follow your health care provider's instructions about healthy diet, exercising, and getting tested or screened for diseases. Follow your health care provider's instructions on monitoring your cholesterol and blood pressure. This information is not intended to replace advice given to you by your health care provider. Make sure you discuss any questions you have with your health care provider. Document Revised: 05/09/2020 Document Reviewed: 05/09/2020 Elsevier Patient Education  2023 [employer]  

## 2021-09-20 NOTE — Progress Notes (Addendum)
Complete physical exam  Patient: Zachary [name]   DOB: Sep 10, 1960   61 y.o. Male  MRN: [id number]  Subjective:    Chief Complaint  Patient presents with   Annual Exam    Fasting     Zachary [name] is a 61 y.o. male who presents today for a complete physical exam. He reports consuming a  one meal a day and snack   . The patient does not participate in regular exercise at present. He generally feels fairly well. He reports sleeping poorly.  He does have OSA and is in the process of getting CPAP machine.  He was recently in the hospital for treatment of a drug overdose.  He is being followed by psychiatry and is on medication for this.  He also sees GI for his underlying Crohn's disease, pancreatic insufficiency and fecal incontinence problems.  He does have a history of colonic polyps.  He does wear a diaper to help with his OAB.  Urinalysis while in the hospital did show question of red cells.  Continues on his thyroid medication and is having no difficulty with that.  Continues on his blood pressure medication.  There is a family history of prostate cancer.  He does have reflux symptoms and is using pantoprazole and followed by Dr. Fuller Plan.  Does complain of sneezing, rhinorrhea.  He is interested in starting on PrEP.  Otherwise his family and social history as well as health maintenance and immunizations was reviewed.  Most recent fall risk assessment:     No data to display           Most recent depression screenings:    07/03/2021    9:05 AM 01/17/2021    9:39 AM  PHQ 2/9 Scores  PHQ - 2 Score 2 0  PHQ- 9 Score 6 3       Patient Active Problem List   Diagnosis Date Noted   Mixed obsessional thoughts and acts 09/04/2021   MDD (major depressive disorder), recurrent episode, severe ([place]) 08/31/2021   History of drug overdose 08/30/2021   Tachycardia 06/15/2021   OSA (obstructive sleep apnea) 06/15/2021   Family history of prostate cancer 09/13/2020   Absence of bladder  continence 08/30/2020   High risk medication use 08/30/2020   Hypothyroidism 08/30/2020   Incontinence of feces 11/03/2019   OAB (overactive bladder) 10/26/2019   Pancreatic insufficiency 10/26/2019   Spinal stenosis of lumbar region 03/03/2018   Essential hypertension 01/27/2018   OCD (obsessive compulsive disorder) 10/10/2017   Social anxiety disorder 10/10/2017   Seasonal allergies 06/26/2010   History of herpes labialis 06/26/2010   FLATULENCE ERUCTATION AND GAS PAIN 09/23/2007   Major depression 09/22/2007   CROHN'S DISEASE, LARGE AND SMALL INTESTINES 09/22/2007   COLONIC POLYPS, HYPERPLASTIC, HX OF 09/22/2007   Past Medical History:  Diagnosis Date   Anxiety    Crohn disease ([place])    pt reports subsequent testing was normal   Depression    GERD (gastroesophageal reflux disease)    Herpes simplex    Hoarding disorder    Hyperlipidemia    Hypertension    IBS (irritable bowel syndrome)    Incontinence    Past Surgical History:  Procedure Laterality Date   APPENDECTOMY     COLONOSCOPY     POLYPECTOMY     TWISTED TESTICLES  1970   [place]    UMBILICAL HERNIA REPAIR     Social History   Tobacco Use   Smoking status: Former  Types: Cigarettes    Quit date: 1980    Years since quitting: 43.7   Smokeless tobacco: Never  Vaping Use   Vaping Use: Never used  Substance Use Topics   Alcohol use: No   Drug use: No   Family History  Problem Relation Age of Onset   Arthritis Mother    Stroke Mother    Depression Mother    Hypertension Father    Prostate cancer Father 80   Breast cancer Maternal Grandmother    Cancer Maternal Grandfather        unknown type   Cancer Paternal Grandmother        unknown type   Lung cancer Maternal Aunt    Prostate cancer Paternal Uncle    Prostate cancer Paternal Uncle    Colon cancer Neg Hx    Rectal cancer Neg Hx    Stomach cancer Neg Hx    Esophageal cancer Neg Hx    Pancreatic cancer Neg Hx    Allergies  Allergen  Reactions   Erythromycin Other (See Comments)    Patient said he is "allergic," but the reaction was not cited   Penicillins Other (See Comments)    "It may kill me"   Tetracyclines & Related Hives      Patient Care Team: Denita Lung, MD as PCP - General (Family Medicine)   Outpatient Medications Prior to Visit  Medication Sig   aspirin 81 MG EC tablet Take 81 mg by mouth daily. Swallow whole.   budesonide (ENTOCORT EC) 3 MG 24 hr capsule TAKE 3 CAPSULES BY MOUTH EVERY DAY (Patient taking differently: Take 9 mg by mouth daily.)   busPIRone (BUSPAR) 30 MG tablet Take 1 tablet (30 mg total) by mouth 2 (two) times daily.   CREON 36000-114000 units CPEP capsule TAKE 4 CAPSULES BY MOUTH BEFORE A MEAL& 2 WITH EACH SNACK   donepezil (ARICEPT) 10 MG tablet Take 1 tablet (10 mg total) by mouth at bedtime.   fluvoxaMINE (LUVOX) 50 MG tablet Take 3 tablets (150 mg total) by mouth at bedtime.   lamoTRIgine (LAMICTAL) 200 MG tablet Take 1 tablet (200 mg total) by mouth daily.   levothyroxine (SYNTHROID) 75 MCG tablet TAKE 1 TABLET(75 MCG) BY MOUTH DAILY   losartan (COZAAR) 100 MG tablet Take 1 tablet (100 mg total) by mouth daily.   mesalamine (APRISO) 0.375 g 24 hr capsule Take 4 capsules (1.5 g total) by mouth daily.   OLANZapine (ZYPREXA) 15 MG tablet Take 1 tablet (15 mg total) by mouth at bedtime.   pantoprazole (PROTONIX) 40 MG tablet Take 1 tablet (40 mg total) by mouth daily at 12 noon.   sodium chloride 1 g tablet TAKE 1 TABLET(1 GRAM) BY MOUTH THREE TIMES DAILY (Patient taking differently: Take 1 g by mouth 3 (three) times daily.)   loperamide (IMODIUM) 2 MG capsule Take 1 capsule (2 mg total) by mouth every 4 (four) hours. As needed (Patient not taking: Reported on 09/08/2021)   No facility-administered medications prior to visit.    Review of Systems  All other systems reviewed and are negative.         Objective:     BP (!) 142/90   Pulse 81   Temp (!) 97.5 F (36.4 C)    Ht 6' 2"  (1.88 m)   Wt 245 lb 12.8 oz (111.5 kg)   SpO2 94%   BMI 31.56 kg/m  BP Readings from Last 3 Encounters:  09/20/21 (!) 142/90  09/18/21 110/70  08/31/21 119/71   Wt Readings from Last 3 Encounters:  09/20/21 245 lb 12.8 oz (111.5 kg)  09/18/21 252 lb (114.3 kg)  09/08/21 242 lb 9.6 oz (110 kg)      Physical Exam  Alert and in no distress. Tympanic membranes and canals are normal. Pharyngeal area is normal. Neck is supple without adenopathy or thyromegaly. Cardiac exam shows a regular sinus rhythm without murmurs or gallops. Lungs are clear to auscultation.  Abdominal exam shows no masses or tenderness with normal bowel sounds  Results for orders placed or performed in visit on 09/20/21  POCT Urinalysis DIP (Proadvantage Device)  Result Value Ref Range   Color, UA yellow yellow   Clarity, UA clear clear   Glucose, UA negative negative mg/dL   Bilirubin, UA negative negative   Ketones, POC UA negative negative mg/dL   Specific Gravity, Urine 1.005    Blood, UA negative negative   pH, UA 6.5 5.0 - 8.0   Protein Ur, POC negative negative mg/dL   Urobilinogen, Ur 0.2    Nitrite, UA Negative Negative   Leukocytes, UA Negative Negative   Last CBC Lab Results  Component Value Date   WBC 10.5 09/01/2021   HGB 13.7 09/01/2021   HCT 41.7 09/01/2021   MCV 96.3 09/01/2021   MCH 31.6 09/01/2021   RDW 12.8 09/01/2021   PLT 349 02/40/9735   Last metabolic panel Lab Results  Component Value Date   GLUCOSE 96 09/03/2021   NA 145 09/03/2021   K 3.7 09/03/2021   CL 108 09/03/2021   CO2 29 09/03/2021   BUN 9 09/03/2021   CREATININE 1.12 09/03/2021   GFRNONAA >60 09/03/2021   CALCIUM 8.9 09/03/2021   PROT 7.0 08/29/2021   ALBUMIN 4.0 08/29/2021   LABGLOB 2.1 08/30/2020   AGRATIO 2.1 08/30/2020   BILITOT 0.8 08/29/2021   ALKPHOS 64 08/29/2021   AST 32 08/29/2021   ALT 34 08/29/2021   ANIONGAP 8 09/03/2021   Last lipids Lab Results  Component Value Date    CHOL 171 09/03/2021   HDL 45 09/03/2021   LDLCALC 87 09/03/2021   TRIG 194 (H) 09/03/2021   CHOLHDL 3.8 09/03/2021        Assessment & Plan:    Routine general medical examination at a health care facility  Abnormal urine - Plan: POCT Urinalysis DIP (Proadvantage Device)  OSA (obstructive sleep apnea)  Obsessive-compulsive disorder, unspecified type  Crohn's disease of both small and large intestine with other complication (HCC)  Acquired hypothyroidism  Severe episode of recurrent major depressive disorder, without psychotic features (HCC)  Seasonal allergies  Essential hypertension  OAB (overactive bladder)  Pancreatic insufficiency  Incontinence of feces, unspecified fecal incontinence type  High risk medication use  History of drug overdose  Contact with or exposure to communicable disease - Plan: RPR+HIV+GC+CT Panel  Family history of prostate cancer - Plan: PSA  Obesity (BMI 30.0-34.9)  Immunization History  Administered Date(s) Administered   DTaP 04/25/2010   Influenza Split 10/29/2005, 09/25/2011, 09/01/2013   Influenza Whole 10/06/2008, 09/21/2009, 09/15/2012   Influenza,inj,Quad PF,6+ Mos 10/22/2017, 10/22/2017, 10/01/2018, 09/13/2020, 09/18/2021   Influenza-Unspecified 08/18/2014, 08/04/2015, 10/10/2019   Moderna Covid-19 Vaccine Bivalent Booster 64yr & up 01/17/2021   Moderna Sars-Covid-2 Vaccination 03/31/2019, 05/08/2019, 11/27/2019   Tdap 08/04/2015   Zoster Recombinat (Shingrix) 06/08/2016, 08/08/2016    Health Maintenance  Topic Date Due   COVID-19 Vaccine (5 - Moderna risk series) 03/14/2021   COLONOSCOPY (Pts 45-463yrInsurance  coverage will need to be confirmed)  01/24/2025   TETANUS/TDAP  08/03/2025   INFLUENZA VACCINE  Completed   Hepatitis C Screening  Completed   HIV Screening  Completed   Zoster Vaccines- Shingrix  Completed   HPV VACCINES  Aged Out  He will continue on his present medication regimen and follow-up with his  psychiatrist, gastroenterologist.  He will let me know when he gets his CPAP so we can follow-up.  Discussed prep with him and also Doxy prep.  Explained the need for recheck every 3 months with sexual activity.  Also explained the need for doxycycline prior to sexual activity.  He did mention some symptoms that could be ulnar nerve issue on the left and I explained what to look for in terms of whether this is the case mainly with position of elbow.   Problem List Items Addressed This Visit     RESOLVED: Attention and concentration deficit   CROHN'S DISEASE, LARGE AND SMALL INTESTINES   Essential hypertension   Family history of prostate cancer   Relevant Orders   PSA   High risk medication use   History of drug overdose   Hypothyroidism   Incontinence of feces   MDD (major depressive disorder), recurrent episode, severe (HCC)   Mixed obsessional thoughts and acts   OAB (overactive bladder)   OCD (obsessive compulsive disorder)   OSA (obstructive sleep apnea)   Pancreatic insufficiency   Seasonal allergies   Other Visit Diagnoses     Routine general medical examination at a health care facility    -  Primary   Abnormal urine       Relevant Orders   POCT Urinalysis DIP (Proadvantage Device) (Completed)   Contact with or exposure to communicable disease       Relevant Orders   RPR+HIV+GC+CT Panel      Follow-up pending blood work and starting on PrEP.  [facility] was called in and he is to return here in 3 months.  Jill Alexanders, MD

## 2021-09-23 LAB — RPR+HIV+GC+CT PANEL
Chlamydia trachomatis, NAA: NEGATIVE
HIV Screen 4th Generation wRfx: NONREACTIVE
Neisseria Gonorrhoeae by PCR: NEGATIVE
RPR Ser Ql: NONREACTIVE

## 2021-09-23 LAB — PSA: Prostate Specific Ag, Serum: 0.8 ng/mL (ref 0.0–4.0)

## 2021-09-25 MED ORDER — EMTRICITABINE-TENOFOVIR DF 200-300 MG PO TABS: 1.0000 | ORAL_TABLET | Freq: Every day | ORAL | 0 refills | Status: DC

## 2021-09-25 NOTE — Addendum Note (Signed)
Addended by: Denita Lung on: 09/25/2021 04:56 PM   Modules accepted: Orders

## 2021-09-26 NOTE — Progress Notes (Signed)
Lvm for pt to call back for labs . [place]

## 2021-09-29 ENCOUNTER — Other Ambulatory Visit: Payer: Self-pay | Admitting: Family Medicine

## 2021-09-29 DIAGNOSIS — E039 Hypothyroidism, unspecified: Secondary | ICD-10-CM

## 2021-10-02 ENCOUNTER — Ambulatory Visit (INDEPENDENT_AMBULATORY_CARE_PROVIDER_SITE_OTHER): Payer: 59 | Admitting: Psychiatry

## 2021-10-02 ENCOUNTER — Encounter: Payer: Self-pay | Admitting: Psychiatry

## 2021-10-02 DIAGNOSIS — F4010 Social phobia, unspecified: Secondary | ICD-10-CM | POA: Diagnosis not present

## 2021-10-02 DIAGNOSIS — F331 Major depressive disorder, recurrent, moderate: Secondary | ICD-10-CM

## 2021-10-02 DIAGNOSIS — F422 Mixed obsessional thoughts and acts: Secondary | ICD-10-CM

## 2021-10-02 DIAGNOSIS — F423 Hoarding disorder: Secondary | ICD-10-CM

## 2021-10-02 DIAGNOSIS — F5105 Insomnia due to other mental disorder: Secondary | ICD-10-CM | POA: Diagnosis not present

## 2021-10-02 DIAGNOSIS — G3184 Mild cognitive impairment, so stated: Secondary | ICD-10-CM

## 2021-10-02 MED ORDER — FLUVOXAMINE MALEATE 50 MG PO TABS: 150.0000 mg | ORAL_TABLET | Freq: Every day | ORAL | 1 refills | Status: DC

## 2021-10-02 MED ORDER — BUSPIRONE HCL 30 MG PO TABS: 30.0000 mg | ORAL_TABLET | Freq: Two times a day (BID) | ORAL | 0 refills | Status: DC

## 2021-10-02 MED ORDER — TRAZODONE HCL 100 MG PO TABS: ORAL_TABLET | ORAL | 0 refills | Status: DC

## 2021-10-02 MED ORDER — LAMOTRIGINE 200 MG PO TABS: 200.0000 mg | ORAL_TABLET | Freq: Every day | ORAL | 0 refills | Status: DC

## 2021-10-02 MED ORDER — OLANZAPINE 15 MG PO TABS: 15.0000 mg | ORAL_TABLET | Freq: Every day | ORAL | 0 refills | Status: DC

## 2021-10-02 NOTE — Progress Notes (Signed)
Zachary [name] [id number] Feb 07, 1960 61 y.o.  Subjective:   Patient ID:  Zachary [name] is a 61 y.o. (DOB 04/14/1960) male.  Chief Complaint:  Chief Complaint  Patient presents with   Follow-up   Depression   Anxiety   Sleeping Problem    Depression        Associated symptoms include decreased concentration.  Associated symptoms include no suicidal ideas.  Past medical history includes anxiety.   Anxiety Symptoms include decreased concentration and nervous/anxious behavior. Patient reports no confusion, dizziness, nausea or suicidal ideas.     Zachary [name] presents to the office today for follow-up of depression, obsessive anxiety and paranoia about body odor and getting dementia.  Did feel somewhat reassured about the fact that Zachary [name] said he does not have Alzheimer's dx.  visit October 16, 2018.  Initiated a trial of naltrexone off label 50 mg daily for impulse control purchasing.  01/16/2019 appointment with the following noted No benefit naltrexone.  Doesn't like his job but fears change in jobs bc "of the farting" and doesn't feel he'd be accepted in other places.  In this job for 16 years without a promotion.  There have bbeen layoffs and he hasn't gotten layoffs.   The same overall with mental health issues.  Nice new boss.   Worries over his thoughts of patient.  Mood stable.   Continued concerns over spending on things he doesn't need or even really want.  Books he'll never read or DVDs he'll never watch. Huge problem buying too much off the Internet.  Blew check from incentive on impulse purchase from the Internet.  Books and collectibles per usual.  Running up debt.  No other impulsivity.   Not hyper nor otherwise manic.    No history of bankruptcy.  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  Maybe out of boredom.  Needs to stop and can't control himself.Has not talked to anyone about it.  Doesn't get anxious around the spending.  Not depressed.  Busy at work.   Sleep fine.  Sometimes anxious about passing gas at work and not otherwise anxious there.  Rarely used Xanax.   Depression has remained under control. Overall he thinks the depression and anxiety levels have been "fine".  Friends notice his comprehension problems. Not sure he trusts the neuropsychological testing which was normal and did not show dementia as he fears.  Plan: No meds changed.  07/23/2019 appointment with the following noted: Concerns over concentration and memory.  Thinks he's having trouble with word-finding.  Bosses satisfied with work response and function. Plan: Check B12 and folated DT cognitive complaints. Trial reduction in olanzapine to see if cognition is better to 15 mg daily.  11/25/2019 appointment with the following noted: Thinks he had a lot of olanzapine 20 and kept taking it and never reduced it yet. No mental health changes since here.  Work very busy.  Regular anxiety thing but no depression.  Obsesses over farting and bothering people. Plan: Trial reduction in olanzapine to see if cognition is better to 15 mg daily.  03/22/2020 appointment with following noted: Might be a little better with cognition with the reduction in olanzapine.  No worse anxiety, fear, depression.  Worry is not worse.  Still sleeps well.   Overall things are fine in his mental health and function.  Still working.  Tired of the job and considering job at [name].  Got confused by the online app and dropped it.  Work pretty  busy.  Employee situation stable.   Plan: Trial reduction in olanzapine to see if cognition is better to 15 mg daily may be succcessful and nothing is worse so no change in dose..  08/26/20 TC:  Pt did not give me any info other thren he can't concentrate enough to count to 30.I asked him did he make any med changes lately and he said he stopped taking meds for incontinence but that's all.I will have admins add him to the cancellation list,but he wants to know if anything can  be done about the concentration.  09/22/20  appt noted: Hard time at work.  More trouble with concentration and memory.  Seemed worse and went to PCP and lab work up.  Normal B12, TSH, CBC, RPR. Head MRI mild atrophy. 08/30/20  Na 128 Was told to discuss psych meds and low sodium.   Sunday had a few hours of giddiness and then again yesterday and doesn't remember feeling that good as an adult.  Had not changed meds or used drugs.   Plan: Reduce fluvoxamine to 3 at night Stop lithium Add salt tablet 1 twice daily with food Repeat sodium level in 1 week. Sent message to patient's primary care physician regarding the possibility of stopping hydrochlorothiazide which could be contributing to the low sodium.  10/19/2020 appointment with the following noted: Just got sodium yesterday so hasn't repeated level.  HCTZ stopped. Didn't stop lithium. Says he still has concentration problems.  Had a hard time counting to 50.   Still working and goes slow but getting things done eventually.   Plan: Disc recentPatient had a recent episode of hyponatremia with some mental status changes.  The mental status changes have resolved.  We discussed the possibility that virtually every psychiatric medicine can cause low sodium as can hydrochlorothiazide.  Typically SSRIs are the most common cause.  We discussed the risk of reducing his psychiatric medication.  However the hyponatremia must be addressed. HCTZ was stopped, luvox reduced to 300 and salt added yesterday Repeat sodium in 1 week.  11/16/2020 phone call the patient from MD: He has a recent history of low sodium.  Tell him his sodium level is totally normal now at 137.  He can probably drop to 1 salt tablet daily at this point.  No further med changes are necessary.  12/29/2020 appointment with the following noted: Worrying about hoarding which has gotten really bad in the last few weeks.  Since father died have access to more money than I need.  Buy more  than 1 of the same item: [name] dolls, [name] and [name].   Got inheritance trust fund.   On med for Crohn's dz and believes he still smells and worries over it. Plan: Hoarding worsened since Luvox reduced to 300 mg daily so increase to 400 mg daily.  07/13/2021 appointment with the following noted: He continues buspirone 30 mg twice daily, Aricept 10 mg daily, fluvoxamine 400 mg daily, lamotrigine 200 mg daily, lithium 150 nightly, olanzapine 15 mg nightly. Not good.  3 mos ago fell off truck and landed on back and broke foot.  Laid up for few weeks and got depressed.  Since then has developed fecal incontinence.  Not sure how to deal with it.  Seeing GI doc. Had anxiety attack and had to leave work and go home.  Was triggered by obsessing on health issues. Asks for prn for anxiety.   apptetite is less but not sure about wt changes Had home  sleep study for OSA but no results yet. Generally worried about health. Patient denies difficulty with sleep initiation or maintenance over 8 hours.   Patient denies any suicidal ideation.  10/02/21 appt noted: Had suicide attempt 08/29/21 on OD 30-40 fluvoxamine after failed attempt to pursue relationship.  Triggered extreme guilt and illogical paranoia of being arrested.  Realizes now he did nothing wrong and fear of arrest were illogical.  Totally different now and feels much better.   Had been declining in mental health including before that  including not eating in 3 days befor eand neglecting hygiene.   Trouble sleeping with one good night per week.  Today slept 8P-1AM he thinks 5-6 hours of sleep lately. Has looked at YouTube all night but not doing it now.  Trying to make better decisions.   Glad he didn't die.  No SI since then.  Not depressed but tired.   Has been consistent with meds.  No SE. Less caffeine than in the past.  No tea or coffee.   No recreational drugs Fluvoxamine reduced to 150 mg after hospital stay and anxiety and  hoarding are not worse.  F has unknown cancer in chemo in early 50's.   Talk every other day.   Past psych med: Abilify, Zyprexa, Seroquel 600, olanzapine 30 Wellbutrin, nortriptyline, clomipramine, mirtazapine,  fluvoxamine 400, Lamotrigine, lithium no response,  stimulants, Aricept  buspirone 60,   Xanax Naltrexone for compulsive buying NR  Review of Systems:  Review of Systems  Respiratory:  Negative for cough.   Gastrointestinal:  Positive for diarrhea. Negative for abdominal distention, nausea and vomiting.       Complaining chronic gas issues he finds embarrassing.  Musculoskeletal:  Positive for back pain.  Neurological:  Negative for dizziness, tremors and weakness.  Psychiatric/Behavioral:  Positive for decreased concentration. Negative for agitation, behavioral problems, confusion, dysphoric mood, hallucinations, self-injury, sleep disturbance and suicidal ideas. The patient is nervous/anxious. The patient is not hyperactive.     Medications: I have reviewed the patient's current medications.  Current Outpatient Medications  Medication Sig Dispense Refill   aspirin 81 MG EC tablet Take 81 mg by mouth daily. Swallow whole.     budesonide (ENTOCORT EC) 3 MG 24 hr capsule TAKE 3 CAPSULES BY MOUTH EVERY DAY (Patient taking differently: Take 9 mg by mouth daily.) 90 capsule 5   CREON 36000-114000 units CPEP capsule TAKE 4 CAPSULES BY MOUTH BEFORE A MEAL& 2 WITH EACH SNACK 1440 capsule 3   donepezil (ARICEPT) 10 MG tablet Take 1 tablet (10 mg total) by mouth at bedtime. 90 tablet 3   emtricitabine-tenofovir (TRUVADA) 200-300 MG tablet Take 1 tablet by mouth daily. 90 tablet 0   levothyroxine (SYNTHROID) 75 MCG tablet TAKE 1 TABLET(75 MCG) BY MOUTH DAILY 90 tablet 0   losartan (COZAAR) 100 MG tablet Take 1 tablet (100 mg total) by mouth daily.     pantoprazole (PROTONIX) 40 MG tablet Take 1 tablet (40 mg total) by mouth daily at 12 noon.     sodium chloride 1 g tablet TAKE 1  TABLET(1 GRAM) BY MOUTH THREE TIMES DAILY (Patient taking differently: Take 1 g by mouth 3 (three) times daily. 1 daily) 90 tablet 0   traZODone (DESYREL) 100 MG tablet Take 1/2-1 tablet po QHS prn insomnia 30 tablet 0   busPIRone (BUSPAR) 30 MG tablet Take 1 tablet (30 mg total) by mouth 2 (two) times daily. 180 tablet 0   fluvoxaMINE (LUVOX) 50 MG tablet Take 3  tablets (150 mg total) by mouth at bedtime. 90 tablet 1   lamoTRIgine (LAMICTAL) 200 MG tablet Take 1 tablet (200 mg total) by mouth daily. 90 tablet 0   lithium carbonate 150 MG capsule TAKE 1 CAPSULE(150 MG) BY MOUTH DAILY (Patient not taking: Reported on 10/02/2021) 30 capsule 1   loperamide (IMODIUM) 2 MG capsule Take 1 capsule (2 mg total) by mouth every 4 (four) hours. As needed (Patient not taking: Reported on 09/08/2021) 60 capsule 2   mesalamine (APRISO) 0.375 g 24 hr capsule Take 4 capsules (1.5 g total) by mouth daily. 120 capsule 11   OLANZapine (ZYPREXA) 15 MG tablet Take 1 tablet (15 mg total) by mouth at bedtime. 90 tablet 0   No current facility-administered medications for this visit.    Medication Side Effects: None  Allergies:  Allergies  Allergen Reactions   Erythromycin Other (See Comments)    Patient said he is "allergic," but the reaction was not cited   Penicillins Other (See Comments)    "It may kill me"   Tetracyclines & Related Hives    Past Medical History:  Diagnosis Date   Anxiety    Crohn disease ([place])    pt reports subsequent testing was normal   Depression    GERD (gastroesophageal reflux disease)    Herpes simplex    Hoarding disorder    Hyperlipidemia    Hypertension    IBS (irritable bowel syndrome)    Incontinence     Family History  Problem Relation Age of Onset   Arthritis Mother    Stroke Mother    Depression Mother    Hypertension Father    Prostate cancer Father 22   Breast cancer Maternal Grandmother    Cancer Maternal Grandfather        unknown type   Cancer Paternal  Grandmother        unknown type   Lung cancer Maternal Aunt    Prostate cancer Paternal Uncle    Prostate cancer Paternal Uncle    Colon cancer Neg Hx    Rectal cancer Neg Hx    Stomach cancer Neg Hx    Esophageal cancer Neg Hx    Pancreatic cancer Neg Hx     Social History   Socioeconomic History   Marital status: Single    Spouse name: Not on file   Number of children: Not on file   Years of education: Not on file   Highest education level: Not on file  Occupational History   Not on file  Tobacco Use   Smoking status: Former    Types: Cigarettes    Quit date: 1980    Years since quitting: 43.7   Smokeless tobacco: Never  Vaping Use   Vaping Use: Never used  Substance and Sexual Activity   Alcohol use: No   Drug use: No   Sexual activity: Not Currently  Other Topics Concern   Not on file  Social History Narrative   Lives alone.     Social Determinants of Health   Financial Resource Strain: Not on file  Food Insecurity: Not on file  Transportation Needs: Not on file  Physical Activity: Not on file  Stress: Not on file  Social Connections: Not on file  Intimate Partner Violence: Not on file    Past Medical History, Surgical history, Social history, and Family history were reviewed and updated as appropriate.   Please see review of systems for further details on the patient's review from  today.   Objective:   Physical Exam:  There were no vitals taken for this visit.  Physical Exam Constitutional:      General: He is not in acute distress.    Appearance: He is well-developed.  Musculoskeletal:        General: No deformity.  Neurological:     Mental Status: He is alert and oriented to person, place, and time.     Motor: No tremor.     Coordination: Coordination normal. Heel to Shin Test normal.  Psychiatric:        Attention and Perception: Attention normal. He is attentive.        Mood and Affect: Mood is anxious. Mood is not depressed. Affect is  blunt. Affect is not labile, angry or inappropriate.        Speech: Speech is not rapid and pressured, delayed or slurred.        Behavior: Behavior normal. Behavior is not agitated or hyperactive.        Thought Content: Thought content is paranoid. Thought content does not include homicidal or suicidal ideation. Thought content does not include suicidal plan.        Cognition and Memory: Cognition normal.        Judgment: Judgment is not inappropriate.     Comments: Insight is fair. Not as depressed since back at work.  But still some Igained wt since here.     Lab Review:     Component Value Date/Time   NA 145 09/03/2021 0645   NA 139 06/15/2021 1236   K 3.7 09/03/2021 0645   CL 108 09/03/2021 0645   CO2 29 09/03/2021 0645   GLUCOSE 96 09/03/2021 0645   BUN 9 09/03/2021 0645   BUN 14 06/15/2021 1236   CREATININE 1.12 09/03/2021 0645   CREATININE 1.13 10/28/2020 1708   CALCIUM 8.9 09/03/2021 0645   PROT 7.0 08/29/2021 1300   PROT 6.5 08/30/2020 1325   ALBUMIN 4.0 08/29/2021 1300   ALBUMIN 4.4 08/30/2020 1325   AST 32 08/29/2021 1300   ALT 34 08/29/2021 1300   ALKPHOS 64 08/29/2021 1300   BILITOT 0.8 08/29/2021 1300   BILITOT 0.5 08/30/2020 1325   GFRNONAA >60 09/03/2021 0645   GFRAA 90 09/11/2018 1120       Component Value Date/Time   WBC 10.5 09/01/2021 2044   RBC 4.33 09/01/2021 2044   HGB 13.7 09/01/2021 2044   HGB 13.9 08/30/2020 1325   HCT 41.7 09/01/2021 2044   HCT 40.6 08/30/2020 1325   PLT 349 09/01/2021 2044   PLT 341 08/30/2020 1325   MCV 96.3 09/01/2021 2044   MCV 91 08/30/2020 1325   MCH 31.6 09/01/2021 2044   MCHC 32.9 09/01/2021 2044   RDW 12.8 09/01/2021 2044   RDW 12.1 08/30/2020 1325   LYMPHSABS 2.1 09/01/2021 2044   LYMPHSABS 1.6 08/30/2020 1325   MONOABS 0.9 09/01/2021 2044   EOSABS 0.2 09/01/2021 2044   EOSABS 0.3 08/30/2020 1325   BASOSABS 0.1 09/01/2021 2044   BASOSABS 0.1 08/30/2020 1325    Lithium Lvl  Date Value Ref Range  Status  08/30/2021 <0.06 (L) 0.60 - 1.20 mmol/L Final    Comment:    Performed at [facility], [place] [address], [place], [place] [address]     10/26/2019 B12 680  No results found for: "PHENYTOIN", "PHENOBARB", "VALPROATE", "CBMZ"   .res Assessment: Plan:    Zachary [name] was seen today for follow-up, depression, anxiety and sleeping problem.  Diagnoses and all orders for this visit:  Major depressive disorder, recurrent episode, moderate (HCC) -     lamoTRIgine (LAMICTAL) 200 MG tablet; Take 1 tablet (200 mg total) by mouth daily. -     OLANZapine (ZYPREXA) 15 MG tablet; Take 1 tablet (15 mg total) by mouth at bedtime. -     fluvoxaMINE (LUVOX) 50 MG tablet; Take 3 tablets (150 mg total) by mouth at bedtime.  Insomnia due to mental condition -     traZODone (DESYREL) 100 MG tablet; Take 1/2-1 tablet po QHS prn insomnia  Mixed obsessional thoughts and acts -     busPIRone (BUSPAR) 30 MG tablet; Take 1 tablet (30 mg total) by mouth 2 (two) times daily. -     OLANZapine (ZYPREXA) 15 MG tablet; Take 1 tablet (15 mg total) by mouth at bedtime. -     fluvoxaMINE (LUVOX) 50 MG tablet; Take 3 tablets (150 mg total) by mouth at bedtime.  Social anxiety disorder -     busPIRone (BUSPAR) 30 MG tablet; Take 1 tablet (30 mg total) by mouth 2 (two) times daily. -     OLANZapine (ZYPREXA) 15 MG tablet; Take 1 tablet (15 mg total) by mouth at bedtime.  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  Specifically he is chronically  suspicious that he has dementia despite neuropsychological testing which tells him he does not.  He also has chronic thoughts that other people find his odor offensive because of passing gas.  There is very little evidence to support this.  He has been  under my psychiatric care for many many years and never owed noticed body odor and appointments at any time.  He also suspects that people are thinking negatively of him regularly.  Overall his paranoia and anxiety and depression are unchanged with reduction in olanzapine to 82m daily months ago .    However he had an event which triggered embarrassment and then paranoia and a suicide attempt at the end of August.  He has had no further suicidal ideation and is not depressed.  We processed the event.  Cognitve complaints could be related to untreated OSA. Pending sleep study results.  Hx hyponatremia.  Hoarding and anxiety are not worse with less fluvoxamine to 150 mg so no change.  Discussed potential metabolic side effects associated with atypical antipsychotics, as well as potential risk for movement side effects. Advised pt to contact office if movement side effects occur.  No evidence of movement disorder. Still obsessing on farting and thinking others notice but it's never been noticeable in years of seeing him and he never gets complaints from other about it.  Discussed the risk of polypharmacy but again if appears medically necessary, helpful and well tolerated.  buspirone 30 mg twice daily, Aricept 10 mg daily, fluvoxamine 150 mg daily, lamotrigine 200 mg daily, lithium 150 nightly, olanzapine 15 mg nightly.  Schedule with therapist   FU 8 weeks  CLynder Parents MD, DFAPA  Please see After Visit Summary for patient specific instructions.  Future Appointments  Date Time Provider [place] 12/21/2021  2:00 PM Cottle, CBilley Co, MD CP-CP None  12/27/2021  4:15 PM LDenita Lung MD [facility] 09/26/2022  8:30 AM LDenita Lung MD PFM-PFM [facility]    No orders of the defined types  were placed in this encounter.       -------------------------------

## 2021-10-09 ENCOUNTER — Other Ambulatory Visit: Payer: Self-pay | Admitting: Family Medicine

## 2021-10-09 DIAGNOSIS — I10 Essential (primary) hypertension: Secondary | ICD-10-CM

## 2021-10-17 ENCOUNTER — Telehealth: Payer: Self-pay | Admitting: Family Medicine

## 2021-10-17 NOTE — Telephone Encounter (Signed)
Pt called and states that he missed one dose of Truvada. He only misses one day but states "I needed it that day". Pt wants to know if he has nothing to worry about or needs a Virtual to discuss what needs to be done. Please advise pt at [phone].

## 2021-10-18 ENCOUNTER — Telehealth: Payer: Self-pay | Admitting: Family Medicine

## 2021-10-18 NOTE — Telephone Encounter (Signed)
Pt called in and says he has been having some bleeding in his gums and wondering if it is a side effect of the new medication you prescribed.

## 2021-10-18 NOTE — Telephone Encounter (Signed)
Pt called and left message advising.

## 2021-11-08 ENCOUNTER — Other Ambulatory Visit: Payer: Self-pay | Admitting: Psychiatry

## 2021-11-08 DIAGNOSIS — F5105 Insomnia due to other mental disorder: Secondary | ICD-10-CM

## 2021-11-21 ENCOUNTER — Other Ambulatory Visit (INDEPENDENT_AMBULATORY_CARE_PROVIDER_SITE_OTHER): Payer: 59

## 2021-11-21 DIAGNOSIS — Z23 Encounter for immunization: Secondary | ICD-10-CM

## 2021-11-23 ENCOUNTER — Other Ambulatory Visit: Payer: Self-pay | Admitting: Psychiatry

## 2021-11-23 DIAGNOSIS — G3184 Mild cognitive impairment, so stated: Secondary | ICD-10-CM

## 2021-11-25 ENCOUNTER — Other Ambulatory Visit: Payer: Self-pay | Admitting: Family Medicine

## 2021-11-27 NOTE — Telephone Encounter (Signed)
Refill request last apt 09/20/21 next apt 12/27/21.

## 2021-11-27 NOTE — Telephone Encounter (Signed)
10/2 noted on visit that patient not taking lithium. LVM to [facility] to verify as note shows s/b taking.

## 2021-11-28 NOTE — Telephone Encounter (Signed)
Pt called at 1:21p to advise he is NOT taking Lithium.

## 2021-12-13 ENCOUNTER — Encounter: Payer: Self-pay | Admitting: Psychiatry

## 2021-12-13 ENCOUNTER — Ambulatory Visit (INDEPENDENT_AMBULATORY_CARE_PROVIDER_SITE_OTHER): Payer: 59 | Admitting: Psychiatry

## 2021-12-13 DIAGNOSIS — F422 Mixed obsessional thoughts and acts: Secondary | ICD-10-CM

## 2021-12-13 DIAGNOSIS — F5105 Insomnia due to other mental disorder: Secondary | ICD-10-CM

## 2021-12-13 DIAGNOSIS — F331 Major depressive disorder, recurrent, moderate: Secondary | ICD-10-CM

## 2021-12-13 DIAGNOSIS — F4010 Social phobia, unspecified: Secondary | ICD-10-CM

## 2021-12-13 DIAGNOSIS — F423 Hoarding disorder: Secondary | ICD-10-CM

## 2021-12-13 DIAGNOSIS — G4733 Obstructive sleep apnea (adult) (pediatric): Secondary | ICD-10-CM

## 2021-12-13 DIAGNOSIS — G3184 Mild cognitive impairment, so stated: Secondary | ICD-10-CM | POA: Diagnosis not present

## 2021-12-13 NOTE — Patient Instructions (Signed)
Call [facility] to get them to help you with CPAP machine: [place] [address] Open ? Closes 5?PM  [phone]

## 2021-12-13 NOTE — Progress Notes (Signed)
Done

## 2021-12-13 NOTE — Progress Notes (Signed)
[name] [id number] January 05, 1960 61 y.o.  Subjective:   Patient ID:  Zachary [name] is a 61 y.o. (DOB 03-17-1960) male.  Chief Complaint:  Chief Complaint  Patient presents with   Follow-up    Mixed obsessional thoughts and acts   Depression   Anxiety    Depression        Associated symptoms include decreased concentration.  Associated symptoms include no suicidal ideas.  Past medical history includes anxiety.   Anxiety Symptoms include decreased concentration and nervous/anxious behavior. Patient reports no confusion, dizziness, nausea or suicidal ideas.     Zachary [name] presents to the office today for follow-up of depression, obsessive anxiety and paranoia about body odor and getting dementia.  Did feel somewhat reassured about the fact that Dr. Marcos Eke said he does not have Alzheimer's dx.  visit October 16, 2018.  Initiated a trial of naltrexone off label 50 mg daily for impulse control purchasing.  01/16/2019 appointment with the following noted No benefit naltrexone.  Doesn't like his job but fears change in jobs bc "of the farting" and doesn't feel he'd be accepted in other places.  In this job for 16 years without a promotion.  There have bbeen layoffs and he hasn't gotten layoffs.   The same overall with mental health issues.  Nice new boss.   Worries over his thoughts of patient.  Mood stable.   Continued concerns over spending on things he doesn't need or even really want.  Books he'll never read or DVDs he'll never watch. Huge problem buying too much off the Internet.  Blew check from incentive on impulse purchase from the Internet.  Books and collectibles per usual.  Running up debt.  No other impulsivity.   Not hyper nor otherwise manic.    No history of bankruptcy.  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  Maybe out of boredom.  Needs to stop and can't control himself.Has not talked to anyone about it.  Doesn't get anxious around the spending.  Not  depressed.  Busy at work.  Sleep fine.  Sometimes anxious about passing gas at work and not otherwise anxious there.  Rarely used Xanax.   Depression has remained under control. Overall he thinks the depression and anxiety levels have been "fine".  Friends notice his comprehension problems. Not sure he trusts the neuropsychological testing which was normal and did not show dementia as he fears.  Plan: No meds changed.  07/23/2019 appointment with the following noted: Concerns over concentration and memory.  Thinks he's having trouble with word-finding.  Bosses satisfied with work response and function. Plan: Check B12 and folated DT cognitive complaints. Trial reduction in olanzapine to see if cognition is better to 15 mg daily.  11/25/2019 appointment with the following noted: Thinks he had a lot of olanzapine 20 and kept taking it and never reduced it yet. No mental health changes since here.  Work very busy.  Regular anxiety thing but no depression.  Obsesses over farting and bothering people. Plan: Trial reduction in olanzapine to see if cognition is better to 15 mg daily.  03/22/2020 appointment with following noted: Might be a little better with cognition with the reduction in olanzapine.  No worse anxiety, fear, depression.  Worry is not worse.  Still sleeps well.   Overall things are fine in his mental health and function.  Still working.  Tired of the job and considering job at [employer].  Got confused by the online app and dropped  it.  Work pretty busy.  Employee situation stable.   Plan: Trial reduction in olanzapine to see if cognition is better to 15 mg daily may be succcessful and nothing is worse so no change in dose..  08/26/20 TC:  Pt did not give me any info other thren he can't concentrate enough to count to 30.I asked him did he make any med changes lately and he said he stopped taking meds for incontinence but that's all.I will have admins add him to the cancellation list,but he  wants to know if anything can be done about the concentration.  09/22/20  appt noted: Hard time at work.  More trouble with concentration and memory.  Seemed worse and went to PCP and lab work up.  Normal B12, TSH, CBC, RPR. Head MRI mild atrophy. 08/30/20  Na 128 Was told to discuss psych meds and low sodium.   Sunday had a few hours of giddiness and then again yesterday and doesn't remember feeling that good as an adult.  Had not changed meds or used drugs.   Plan: Reduce fluvoxamine to 3 at night Stop lithium Add salt tablet 1 twice daily with food Repeat sodium level in 1 week. Sent message to patient's primary care physician regarding the possibility of stopping hydrochlorothiazide which could be contributing to the low sodium.  10/19/2020 appointment with the following noted: Just got sodium yesterday so hasn't repeated level.  HCTZ stopped. Didn't stop lithium. Says he still has concentration problems.  Had a hard time counting to 50.   Still working and goes slow but getting things done eventually.   Plan: Disc recentPatient had a recent episode of hyponatremia with some mental status changes.  The mental status changes have resolved.  We discussed the possibility that virtually every psychiatric medicine can cause low sodium as can hydrochlorothiazide.  Typically SSRIs are the most common cause.  We discussed the risk of reducing his psychiatric medication.  However the hyponatremia must be addressed. HCTZ was stopped, luvox reduced to 300 and salt added yesterday Repeat sodium in 1 week.  11/16/2020 phone call the patient from MD: He has a recent history of low sodium.  Tell him his sodium level is totally normal now at 137.  He can probably drop to 1 salt tablet daily at this point.  No further med changes are necessary.  12/29/2020 appointment with the following noted: Worrying about hoarding which has gotten really bad in the last few weeks.  Since father died have access to more  money than I need.  Buy more than 1 of the same item: Alfonso Patten dolls, [place] and glassware.   Got inheritance trust fund.   On med for Crohn's dz and believes he still smells and worries over it. Plan: Hoarding worsened since Luvox reduced to 300 mg daily so increase to 400 mg daily.  07/13/2021 appointment with the following noted: He continues buspirone 30 mg twice daily, Aricept 10 mg daily, fluvoxamine 400 mg daily, lamotrigine 200 mg daily, lithium 150 nightly, olanzapine 15 mg nightly. Not good.  3 mos ago fell off truck and landed on back and broke foot.  Laid up for few weeks and got depressed.  Since then has developed fecal incontinence.  Not sure how to deal with it.  Seeing GI doc. Had anxiety attack and had to leave work and go home.  Was triggered by obsessing on health issues. Asks for prn for anxiety.   apptetite is less but not sure about  wt changes Had home sleep study for OSA but no results yet. Generally worried about health. Patient denies difficulty with sleep initiation or maintenance over 8 hours.   Patient denies any suicidal ideation.  10/02/21 appt noted: Had suicide attempt 08/29/21 on OD 30-40 fluvoxamine after failed attempt to pursue relationship.  Triggered extreme guilt and illogical paranoia of being arrested.  Realizes now he did nothing wrong and fear of arrest were illogical.  Totally different now and feels much better.   Had been declining in mental health including before that  including not eating in 3 days befor eand neglecting hygiene.   Trouble sleeping with one good night per week.  Today slept 8P-1AM he thinks 5-6 hours of sleep lately. Has looked at YouTube all night but not doing it now.  Trying to make better decisions.   Glad he didn't die.  No SI since then.  Not depressed but tired.   Has been consistent with meds.  No SE. Less caffeine than in the past.  No tea or coffee.   No recreational drugs Fluvoxamine reduced to 150 mg after  hospital stay and anxiety and hoarding are not worse. Plan: buspirone 30 mg twice daily, Aricept 10 mg daily, fluvoxamine 150 mg daily, lamotrigine 200 mg daily, lithium 150 nightly, olanzapine 15 mg nightly. Schedule with therapist   12/13/21 appt noted:  appt moved eariler Pych meds: buspar 30 BID, fluvox 400, olanzapine 15, lamotrigine 200, donepezil 10.  No Abilify Before SA had previews of problems.  He's noticing that again including less attention to hygiene and care of apt.  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  Can afford what he is buying.  Taking fluvoxamine 400 mg since hosp stay and not just recently. Doesn't feel sad but tired all the time and has to foce himself to eat at times. Not markely anxious.  Not so much about worry about passing gas but about his rectal problem.   No other fear. Does worry over DM No SI.  Has not stockpiled meds. Work function is pretty good.  Not making mistakes.   Thinks boss values him.   Has sleep apnea but not using CPAP.  Enjoys his sleep.  A little drowsy daytime.  In be 9-6 AM. Doesn't know how to use the machine properly.  F has unknown cancer in chemo in early 92's.   Talk every other day.   Past psych med: Abilify, Zyprexa, Seroquel 600, olanzapine 30 Wellbutrin, nortriptyline, clomipramine, mirtazapine,  fluvoxamine 400, Lamotrigine,  lithium no response,  stimulants, Aricept  buspirone 60,   Xanax Naltrexone for compulsive buying NR  Review of Systems:  Review of Systems  Respiratory:  Negative for cough.   Gastrointestinal:  Positive for diarrhea. Negative for abdominal distention, nausea and vomiting.       Complaining chronic gas issues he finds embarrassing.  Musculoskeletal:  Positive for back pain.  Neurological:  Negative for dizziness, tremors and weakness.  Psychiatric/Behavioral:  Positive for decreased concentration. Negative for agitation, behavioral problems, confusion, dysphoric mood, hallucinations, self-injury, sleep  disturbance and suicidal ideas. The patient is nervous/anxious. The patient is not hyperactive.     Medications: I have reviewed the patient's current medications.  Current Outpatient Medications  Medication Sig Dispense Refill   aspirin 81 MG EC tablet Take 81 mg by mouth daily. Swallow whole.     budesonide (ENTOCORT EC) 3 MG 24 hr capsule TAKE 3 CAPSULES BY MOUTH EVERY DAY (Patient taking differently: Take 9 mg by mouth  daily.) 90 capsule 5   busPIRone (BUSPAR) 30 MG tablet Take 1 tablet (30 mg total) by mouth 2 (two) times daily. 180 tablet 0   CREON 36000-114000 units CPEP capsule TAKE 4 CAPSULES BY MOUTH BEFORE A MEAL& 2 WITH EACH SNACK 1440 capsule 3   donepezil (ARICEPT) 10 MG tablet Take 1 tablet (10 mg total) by mouth at bedtime. 90 tablet 3   emtricitabine-tenofovir (TRUVADA) 200-300 MG tablet TAKE 1 TABLET BY MOUTH DAILY 30 tablet 0   fluvoxaMINE (LUVOX) 50 MG tablet Take 3 tablets (150 mg total) by mouth at bedtime. (Patient taking differently: Take 150 mg by mouth at bedtime. 100 mg 4 tabs daily) 90 tablet 1   lamoTRIgine (LAMICTAL) 200 MG tablet Take 1 tablet (200 mg total) by mouth daily. 90 tablet 0   levothyroxine (SYNTHROID) 75 MCG tablet TAKE 1 TABLET(75 MCG) BY MOUTH DAILY 90 tablet 0   losartan (COZAAR) 100 MG tablet Take 1 tablet (100 mg total) by mouth daily.     OLANZapine (ZYPREXA) 15 MG tablet Take 1 tablet (15 mg total) by mouth at bedtime. 90 tablet 0   pantoprazole (PROTONIX) 40 MG tablet Take 1 tablet (40 mg total) by mouth daily at 12 noon.     sodium chloride 1 g tablet TAKE 1 TABLET(1 GRAM) BY MOUTH THREE TIMES DAILY (Patient taking differently: Take 1 g by mouth 3 (three) times daily. 1 daily) 90 tablet 0   traZODone (DESYREL) 100 MG tablet TAKE 1/2 TO 1 TABLET BY MOUTH EVERY NIGHT AT BEDTIME AS NEEDED FOR INSOMNIA (Patient not taking: Reported on 12/13/2021) 30 tablet 1   lithium carbonate 150 MG capsule TAKE 1 CAPSULE(150 MG) BY MOUTH DAILY (Patient not  taking: Reported on 10/02/2021) 30 capsule 1   loperamide (IMODIUM) 2 MG capsule Take 1 capsule (2 mg total) by mouth every 4 (four) hours. As needed (Patient not taking: Reported on 09/08/2021) 60 capsule 2   No current facility-administered medications for this visit.    Medication Side Effects: None  Allergies:  Allergies  Allergen Reactions   Erythromycin Other (See Comments)    Patient said he is "allergic," but the reaction was not cited   Penicillins Other (See Comments)    "It may kill me"   Tetracyclines & Related Hives    Past Medical History:  Diagnosis Date   Anxiety    Crohn disease ([place])    pt reports subsequent testing was normal   Depression    GERD (gastroesophageal reflux disease)    Herpes simplex    Hoarding disorder    Hyperlipidemia    Hypertension    IBS (irritable bowel syndrome)    Incontinence     Family History  Problem Relation Age of Onset   Arthritis Mother    Stroke Mother    Depression Mother    Hypertension Father    Prostate cancer Father 58   Breast cancer Maternal Grandmother    Cancer Maternal Grandfather        unknown type   Cancer Paternal Grandmother        unknown type   Lung cancer Maternal Aunt    Prostate cancer Paternal Uncle    Prostate cancer Paternal Uncle    Colon cancer Neg Hx    Rectal cancer Neg Hx    Stomach cancer Neg Hx    Esophageal cancer Neg Hx    Pancreatic cancer Neg Hx     Social History   Socioeconomic History  Marital status: Single    Spouse name: Not on file   Number of children: Not on file   Years of education: Not on file   Highest education level: Not on file  Occupational History   Not on file  Tobacco Use   Smoking status: Former    Types: Cigarettes    Quit date: 29    Years since quitting: 43.9   Smokeless tobacco: Never  Vaping Use   Vaping Use: Never used  Substance and Sexual Activity   Alcohol use: No   Drug use: No   Sexual activity: Not Currently  Other Topics  Concern   Not on file  Social History Narrative   Lives alone.     Social Determinants of Health   Financial Resource Strain: Not on file  Food Insecurity: Not on file  Transportation Needs: Not on file  Physical Activity: Not on file  Stress: Not on file  Social Connections: Not on file  Intimate Partner Violence: Not on file    Past Medical History, Surgical history, Social history, and Family history were reviewed and updated as appropriate.   Please see review of systems for further details on the patient's review from today.   Objective:   Physical Exam:  There were no vitals taken for this visit.  Physical Exam Constitutional:      General: He is not in acute distress.    Appearance: He is well-developed.  Musculoskeletal:        General: No deformity.  Neurological:     Mental Status: He is alert and oriented to person, place, and time.     Motor: No tremor.     Coordination: Coordination normal. Heel to Shin Test normal.  Psychiatric:        Attention and Perception: Attention normal. He is attentive.        Mood and Affect: Mood is anxious. Mood is not depressed. Affect is not labile, blunt, angry or inappropriate.        Speech: Speech is not rapid and pressured, delayed or slurred.        Behavior: Behavior normal. Behavior is not agitated or hyperactive.        Thought Content: Thought content is not paranoid or delusional. Thought content does not include homicidal or suicidal ideation. Thought content does not include suicidal plan.        Cognition and Memory: Cognition normal.        Judgment: Judgment is not inappropriate.     Comments: Insight is fair. Denies sadnes Igained wt since here. Looks sleep deprived.       Lab Review:     Component Value Date/Time   NA 145 09/03/2021 0645   NA 139 06/15/2021 1236   K 3.7 09/03/2021 0645   CL 108 09/03/2021 0645   CO2 29 09/03/2021 0645   GLUCOSE 96 09/03/2021 0645   BUN 9 09/03/2021 0645   BUN 14  06/15/2021 1236   CREATININE 1.12 09/03/2021 0645   CREATININE 1.13 10/28/2020 1708   CALCIUM 8.9 09/03/2021 0645   PROT 7.0 08/29/2021 1300   PROT 6.5 08/30/2020 1325   ALBUMIN 4.0 08/29/2021 1300   ALBUMIN 4.4 08/30/2020 1325   AST 32 08/29/2021 1300   ALT 34 08/29/2021 1300   ALKPHOS 64 08/29/2021 1300   BILITOT 0.8 08/29/2021 1300   BILITOT 0.5 08/30/2020 1325   GFRNONAA >60 09/03/2021 0645   GFRAA 90 09/11/2018 1120       Component  Value Date/Time   WBC 10.5 09/01/2021 2044   RBC 4.33 09/01/2021 2044   HGB 13.7 09/01/2021 2044   HGB 13.9 08/30/2020 1325   HCT 41.7 09/01/2021 2044   HCT 40.6 08/30/2020 1325   PLT 349 09/01/2021 2044   PLT 341 08/30/2020 1325   MCV 96.3 09/01/2021 2044   MCV 91 08/30/2020 1325   MCH 31.6 09/01/2021 2044   MCHC 32.9 09/01/2021 2044   RDW 12.8 09/01/2021 2044   RDW 12.1 08/30/2020 1325   LYMPHSABS 2.1 09/01/2021 2044   LYMPHSABS 1.6 08/30/2020 1325   MONOABS 0.9 09/01/2021 2044   EOSABS 0.2 09/01/2021 2044   EOSABS 0.3 08/30/2020 1325   BASOSABS 0.1 09/01/2021 2044   BASOSABS 0.1 08/30/2020 1325    Lithium Lvl  Date Value Ref Range Status  08/30/2021 <0.06 (L) 0.60 - 1.20 mmol/L Final    Comment:    Performed at [facility], [place] [address] [place], [place] [address]     10/26/2019 B12 680  07/01/21 sleep study: AHI 41  No results found for: "PHENYTOIN", "PHENOBARB", "VALPROATE", "CBMZ"   .res Assessment: Plan:    Daegan was seen today for follow-up, depression and anxiety.  Diagnoses and all orders for this visit:  Major depressive disorder, recurrent episode, moderate (HCC)  Mixed obsessional thoughts and acts  Social anxiety disorder  Mild cognitive impairment  Hoarding disorder  Insomnia due to mental condition  OSA (obstructive sleep apnea)   Greater than 50% of 30 min face to face time with patient was spent on counseling and coordination of care. We discussed [name] has been  diagnosed with treatment resistant depression with psychotic features, OCD and social anxiety but schizoaffective may be more appropriate diagnosis given this chronic paranoid thinking.  Specifically he is chronically  suspicious that he has dementia despite neuropsychological testing which tells him he does not.  He also has chronic thoughts that other people find his odor offensive because of passing gas.  There is very little evidence to support this.  He has been under my psychiatric care for many many years and never owed noticed body odor and appointments at any time.  He also suspects that people are thinking negatively of him regularly.  Overall his paranoia and anxiety and depression are unchanged with reduction in olanzapine to 67m daily months ago .    However he had an event which triggered embarrassment and then paranoia and a suicide attempt at the end of August.  He has had no further suicidal ideation and is not depressed.  We processed the event.  Cognitve complaints could be related to untreated OSA. 07/01/21 sleep study: AHI 41 This being untreated is likely causing current sx.  Desperately needs to start using CPAP.  Call [facility] and get help using the machine.   Uses [facility] ? Closes 5?PM  [phone] Hx hyponatremia. Normal 09/2021  Discussed potential metabolic side effects associated with atypical antipsychotics, as well as potential risk for movement side effects. Advised pt to contact office if movement side effects occur.  No evidence of movement disorder. Still obsessing on farting and thinking others notice but it's never been noticeable in years of seeing him and he never gets complaints from other about it.  Discussed the risk of polypharmacy but again if appears medically necessary, helpful and well tolerated.  buspirone 30 mg twice daily, , fluvoxamine 400 mg daily, lamotrigine 200 mg daily, lithium  150 nightly, olanzapine 15  mg nightl he stopped Aricept 10 mg dailyy.  Schedule with therapist   FU 8 weeks  Lynder Parents, MD, DFAPA  Please see After Visit Summary for patient specific instructions.  Future Appointments  Date Time Provider [place]  12/21/2021  2:00 PM Cottle, Billey Co., MD CP-CP None  12/27/2021  4:15 PM Denita Lung, MD [facility]  09/26/2022  8:30 AM Denita Lung, MD PFM-PFM [facility]    No orders of the defined types were placed in this encounter.       -------------------------------

## 2021-12-14 ENCOUNTER — Other Ambulatory Visit: Payer: Self-pay | Admitting: Psychiatry

## 2021-12-14 DIAGNOSIS — F422 Mixed obsessional thoughts and acts: Secondary | ICD-10-CM

## 2021-12-14 DIAGNOSIS — F331 Major depressive disorder, recurrent, moderate: Secondary | ICD-10-CM

## 2021-12-14 NOTE — Telephone Encounter (Signed)
Note from visit 12/13 -  fluvoxamine 400 mg daily.  RF request is for 150. LVM to [facility] and verify dosing.

## 2021-12-15 ENCOUNTER — Other Ambulatory Visit: Payer: Self-pay

## 2021-12-15 MED ORDER — FLUVOXAMINE MALEATE 100 MG PO TABS: 400.0000 mg | ORAL_TABLET | Freq: Every day | ORAL | 2 refills | Status: DC

## 2021-12-15 NOTE — Telephone Encounter (Signed)
Called patient again to verify dosing and mailbox is full.

## 2021-12-18 ENCOUNTER — Other Ambulatory Visit: Payer: Self-pay | Admitting: Family Medicine

## 2021-12-18 DIAGNOSIS — I10 Essential (primary) hypertension: Secondary | ICD-10-CM

## 2021-12-21 ENCOUNTER — Telehealth: Payer: Self-pay | Admitting: Family Medicine

## 2021-12-21 ENCOUNTER — Ambulatory Visit: Payer: 59 | Admitting: Psychiatry

## 2021-12-21 NOTE — Telephone Encounter (Signed)
Pt came in today for what he thought was an appt. Pt was advised appt was next week that JCL was out of that office. Pt was offered an appt in the afternoon with a different provider and pt declined. Pt handed me a bottle of medication, Emtricitabine/tenof 200-300 mg tabs and states he is no longer interested in taking this medication and wanted to give to [facility] for someone that couldn't afford it. Pt then cancelled his appt for next week and said he would have to reschedule for January due to work issues. Pt can be reached at [phone] and gets off from work at 4:00 pm.

## 2021-12-23 ENCOUNTER — Other Ambulatory Visit: Payer: Self-pay | Admitting: Psychiatry

## 2021-12-23 DIAGNOSIS — F331 Major depressive disorder, recurrent, moderate: Secondary | ICD-10-CM

## 2021-12-26 ENCOUNTER — Encounter: Payer: Self-pay | Admitting: Family Medicine

## 2021-12-26 ENCOUNTER — Ambulatory Visit: Payer: 59 | Admitting: Family Medicine

## 2021-12-26 ENCOUNTER — Other Ambulatory Visit: Payer: Self-pay | Admitting: Gastroenterology

## 2021-12-26 VITALS — BP 132/90 | HR 92 | Resp 18 | Wt 249.0 lb

## 2021-12-26 DIAGNOSIS — R350 Frequency of micturition: Secondary | ICD-10-CM

## 2021-12-26 LAB — POCT URINALYSIS DIP (PROADVANTAGE DEVICE)
Bilirubin, UA: NEGATIVE
Blood, UA: NEGATIVE
Glucose, UA: NEGATIVE mg/dL
Ketones, POC UA: NEGATIVE mg/dL
Nitrite, UA: NEGATIVE
Protein Ur, POC: NEGATIVE mg/dL
Specific Gravity, Urine: 1.01
Urobilinogen, Ur: 0.2
pH, UA: 6 (ref 5.0–8.0)

## 2021-12-26 NOTE — Progress Notes (Signed)
   Subjective:    Patient ID: Zachary [name], male    DOB: [birth year], 61 y.o.   MRN: [id number]  HPI He complains of a 3-week history of urinary frequency but no discharge, dysuria, abdominal or pelvic discomfort.  He does occasionally note some dribbling.  This does not occur at night.  He also has a question about possible testicular lesion.   Review of Systems     Objective:   Physical Exam Testicular exam does show the right testy to be larger than the left but no other abnormality noted.  Urine dipstick did show leukocytes however microscopic was negative.       Assessment & Plan:  Urinary frequency - Plan: POCT Urinalysis DIP (Proadvantage Device) Reassured him that I found nothing major concern.  Did recommend he cut down on fluids during the day a little bit and since he is not having trouble at night no further intervention needed.  Reassured him that testicular exam was essentially normal.

## 2021-12-27 ENCOUNTER — Ambulatory Visit: Payer: Self-pay | Admitting: Family Medicine

## 2021-12-28 ENCOUNTER — Other Ambulatory Visit: Payer: Self-pay | Admitting: Family Medicine

## 2021-12-28 ENCOUNTER — Ambulatory Visit: Payer: Self-pay | Admitting: Family Medicine

## 2021-12-28 DIAGNOSIS — E039 Hypothyroidism, unspecified: Secondary | ICD-10-CM

## 2022-01-02 ENCOUNTER — Telehealth: Payer: Self-pay | Admitting: Gastroenterology

## 2022-01-02 NOTE — Telephone Encounter (Signed)
Patient requesting refill for Apriso, says he has been unable to take medication for 4 days. Patient also states that he has had issues with rectal leakage, however since he has been off Apriso those issues have subsided. Patient is wondering if he should have medication changed or if leakage is a common side effect of the medication.

## 2022-01-02 NOTE — Telephone Encounter (Signed)
Returned call to patient. His phone goes straight to vm. Lm on vm for patient to return call.   Apriso was refilled this morning.

## 2022-01-03 NOTE — Telephone Encounter (Signed)
Given symptoms please discontinue Apriso. If incontinence continues then referral for pelvic floor PT. No other recommendations at this time.

## 2022-01-03 NOTE — Telephone Encounter (Signed)
Called and left patient a detailed vm with Dr. Lynne Leader recommendations. Pt has been advised to call us back if incontinence continues despite d/c the Apriso. Pt has been advised to call back if he has any questions or concerns.

## 2022-01-03 NOTE — Telephone Encounter (Signed)
Pt returned call. He has been advised that refill for Apriso was sent in yesterday. Pt states that he has noticed a correlation of anal leakage and taking Apriso. Pt states the leakage is feces, "pudding like". It does not happen all of the time, but when it does it's about 2 TBSP. Pt states that he has had about 2-3 episodes of diarrhea today, but he did have a solid stool yesterday. Pt feels like he is evacuating completely. He still takes Creon 4 capsules with meals and 2 capsules with snacks. Pt states that he noticed the leakage shortly after starting Apriso and when he recently stopped it he no longer had anal leakage. Pt also has flatulence, but that is not new. Pt is not sure how to proceed. Pt would like for me to leave a detailed vm with your recommendations if he is not available. Please advise, thanks.

## 2022-01-11 ENCOUNTER — Other Ambulatory Visit: Payer: Self-pay | Admitting: Gastroenterology

## 2022-01-17 ENCOUNTER — Other Ambulatory Visit: Payer: Self-pay | Admitting: Family Medicine

## 2022-01-17 ENCOUNTER — Telehealth: Payer: Self-pay | Admitting: Physician Assistant

## 2022-01-17 MED ORDER — DICYCLOMINE HCL 10 MG PO CAPS: 10.0000 mg | ORAL_CAPSULE | Freq: Three times a day (TID) | ORAL | 2 refills | Status: DC | PRN

## 2022-01-17 NOTE — Telephone Encounter (Signed)
Called and spoke with patient. His appt was rescheduled to see Dr. Fuller Plan on 01/30/22 at 10:10 am. Pt is aware that I will mail his appt information, he confirmed address on file.

## 2022-01-17 NOTE — Telephone Encounter (Signed)
Patient called stating the last medication he was given for his diarrhea and cramping did not work for him and he would like to try something different.  He is still having issues with the same problems.  He has also scheduled an OV with Jennifer for 02/05/22 at 3:00 p.m.  He asked that you please call and leave a detailed message about sending him in a new prescription so that he would not have to return a call.  Thank you.

## 2022-01-17 NOTE — Telephone Encounter (Signed)
Refill request last apt 12/26/21 next apt 09/26/22.

## 2022-01-17 NOTE — Telephone Encounter (Signed)
Called and spoke with patient regarding recommendations. Pt has been advised to take 1-2 tablets TID PRN for diarrhea. Pt has been advised that he will need to pick up RX for Dicyclomine and take 1 TID PRN for abdominal pain and cramping. Pt verbalized understanding and had no concerns at the end of the call.

## 2022-01-17 NOTE — Telephone Encounter (Signed)
Zachary [name], previously Dr. Fuller Plan recommended discontinuing Apriso and if ongoing incontinence symptoms refer patient to PT. Called and spoke with patient. He confirmed that he did stop the Apriso, he is no longer having incontinence but he is more often having diarrhea then solid stools. Pt had "horrible abdominal cramping" over the weekend. Pt states that he is still taking Creon and Budesonide as prescribed. Pt has not tried any Imodium. I told pt that I will leave him a detailed vm with additional recommendations if he does not answer. Please advise, thanks.

## 2022-01-19 ENCOUNTER — Ambulatory Visit: Payer: 59 | Admitting: Medical

## 2022-01-19 VITALS — BP 130/80 | HR 96 | Ht 74.0 in | Wt 247.0 lb

## 2022-01-19 DIAGNOSIS — G4733 Obstructive sleep apnea (adult) (pediatric): Secondary | ICD-10-CM | POA: Diagnosis not present

## 2022-01-19 DIAGNOSIS — R040 Epistaxis: Secondary | ICD-10-CM | POA: Diagnosis not present

## 2022-01-19 MED ORDER — MUPIROCIN 2 % EX OINT: 1.0000 | TOPICAL_OINTMENT | Freq: Two times a day (BID) | CUTANEOUS | 0 refills | Status: DC

## 2022-01-19 NOTE — Progress Notes (Signed)
Subjective:  Zachary [name] is a 62 y.o. male who presents for Chief Complaint  Patient presents with   Consult    Had a nosebleed Wed and one today. Wed during dinner and today when he woke up. Wears a CPAP at night but is waking up with unexplained quarter sized blood on his pillow for months.      Here for nosebleeds.  Had nosebleed today, one few days ago.  Was able to get it to stop quickly. Been waking with blood on pillow intermittne for months, jsut a few drops.  Doesn't think its coming from ears.  Not sure where the blood is coming from.    Uses CPAP reglarly, not coming.   No other aggravating or relieving factors.    No other c/o.  The following portions of the patient's history were reviewed and updated as appropriate: allergies, current medications, past family history, past medical history, past social history, past surgical history and problem list.  ROS Otherwise as in subjective above    Objective: BP 130/80   Pulse 96   Ht '6\' 2"'$  (1.88 m)   Wt 247 lb (112 kg)   BMI 31.71 kg/m   Wt Readings from Last 3 Encounters:  01/19/22 247 lb (112 kg)  12/26/21 249 lb (112.9 kg)  09/20/21 245 lb 12.8 oz (111.5 kg)   General appearance: alert, no distress, well developed, well nourished HEENT: normocephalic, sclerae anicteric, conjunctiva pink and moist, TMs pearly, nares patent, no discharge or erythema, pharynx normal Oral cavity: MMM, no lesions Neck: supple, no lymphadenopathy, no thyromegaly, no masses Heart: RRR, normal S1, S2, no murmurs   Assessment: Encounter Diagnoses  Name Primary?   Epistaxis Yes   OSA on CPAP      Plan: We discussed his symptoms.  There is no obvious dried blood or evidence of bleeding on exam in the mouth nose or ears.  No obvious skin wounds.  I advised the use some ointment as below into the nostrils the next several days to moisturize the nose.  Advised to maybe use a lower temperature heat setting at home.  I suspect this  is likely due to some dry heat with gas heat.  We discussed other serious causes of bleeding such as blood clotting disorder, low platelets or other.  Labs as below.  Zachary [name] was seen today for consult.  Diagnoses and all orders for this visit:  Epistaxis -     CBC with Differential/Platelet -     PT and PTT  OSA on CPAP  Other orders -     mupirocin ointment (BACTROBAN) 2 %; Apply 1 Application topically 2 (two) times daily.    Follow up: pending labs

## 2022-01-20 LAB — CBC WITH DIFFERENTIAL/PLATELET
Basophils Absolute: 0.1 10*3/uL (ref 0.0–0.2)
Basos: 1 %
EOS (ABSOLUTE): 0.3 10*3/uL (ref 0.0–0.4)
Eos: 4 %
Hematocrit: 45.9 % (ref 37.5–51.0)
Hemoglobin: 15.4 g/dL (ref 13.0–17.7)
Immature Grans (Abs): 0 10*3/uL (ref 0.0–0.1)
Immature Granulocytes: 0 %
Lymphocytes Absolute: 1.8 10*3/uL (ref 0.7–3.1)
Lymphs: 25 %
MCH: 30.3 pg (ref 26.6–33.0)
MCHC: 33.6 g/dL (ref 31.5–35.7)
MCV: 90 fL (ref 79–97)
Monocytes Absolute: 0.6 10*3/uL (ref 0.1–0.9)
Monocytes: 9 %
Neutrophils Absolute: 4.4 10*3/uL (ref 1.4–7.0)
Neutrophils: 61 %
Platelets: 347 10*3/uL (ref 150–450)
RBC: 5.08 x10E6/uL (ref 4.14–5.80)
RDW: 12.6 % (ref 11.6–15.4)
WBC: 7.2 10*3/uL (ref 3.4–10.8)

## 2022-01-20 LAB — PT AND PTT
INR: 1 (ref 0.9–1.2)
Prothrombin Time: 10.7 s (ref 9.1–12.0)
aPTT: 29 s (ref 24–33)

## 2022-01-21 NOTE — Progress Notes (Signed)
Blood counts, platelets and bleeding times normal.   Continue mupirocin ointment every few days in each nostril for a week or 2 to keep nostrils moisturized.  Consider using moisturizer in your house where you spend most of your time such as living room

## 2022-01-22 ENCOUNTER — Other Ambulatory Visit: Payer: Self-pay | Admitting: Psychiatry

## 2022-01-22 DIAGNOSIS — F331 Major depressive disorder, recurrent, moderate: Secondary | ICD-10-CM

## 2022-01-22 DIAGNOSIS — F422 Mixed obsessional thoughts and acts: Secondary | ICD-10-CM

## 2022-01-22 DIAGNOSIS — F4010 Social phobia, unspecified: Secondary | ICD-10-CM

## 2022-01-23 ENCOUNTER — Telehealth: Payer: Self-pay

## 2022-01-23 DIAGNOSIS — R159 Full incontinence of feces: Secondary | ICD-10-CM

## 2022-01-23 NOTE — Telephone Encounter (Signed)
Pt called into the office today due to having "seepage" today. Pt would like to proceed with offered referral. I told pt that I will place the referral for him but he should keep his follow up as scheduled for next week with Dr. Fuller Plan. Pt verbalized understanding and had no concerns at the end of the call.  Per 01/02/22 telephone note - Dr. Fuller Plan recommended referral to pelvic floor PT if pt had continued incontinence.  Ambulatory referral to PT in epic.

## 2022-01-25 ENCOUNTER — Encounter: Payer: Self-pay | Admitting: Family Medicine

## 2022-01-25 ENCOUNTER — Ambulatory Visit (INDEPENDENT_AMBULATORY_CARE_PROVIDER_SITE_OTHER): Payer: 59 | Admitting: Family Medicine

## 2022-01-25 VITALS — BP 106/70 | HR 78 | Temp 98.0°F | Resp 18 | Wt 254.0 lb

## 2022-01-25 DIAGNOSIS — G4733 Obstructive sleep apnea (adult) (pediatric): Secondary | ICD-10-CM

## 2022-01-25 DIAGNOSIS — E669 Obesity, unspecified: Secondary | ICD-10-CM | POA: Diagnosis not present

## 2022-01-25 NOTE — Progress Notes (Addendum)
   Subjective:    Patient ID: Zachary [name], male    DOB: 16-Jun-1960, 62 y.o.   MRN: [id number]  HPI He is here for weight check.  He did bring his CPAP machine him thinking we needed to see it and I explained to him that we need a readout from the machine.  He is actually stating that he feels worse since using the CPAP. He also has issues concerning potassium and I explained that his potassium levels recently were normal.  He then mentioned that he finally looked at himself in the mirror and realized how overweight he was.  He plans to work on reversing this.   Review of Systems     Objective:   Physical Exam Alert and in no distress otherwise not examined       Assessment & Plan:  OSA on CPAP  Obesity (BMI 30.0-34.9) Recommend he go to [facility] and get a CPAP readout and have them send it to me.  Explained that since he feels worse then we need to make sure that the machine is set up properly and that we are getting proper parameters from it. I then discussed diet and exercise losing 20 minutes of something physical daily and also cutting back on carbohydrates specifically bread, rice, pasta, potatoes and sugar. 02/01/2022 I reviewed the CPAP readout.  It indicates over the last 90 days only 54% usage and only 50% greater than 4 hours.  I explained this to him.  Encouraged him to use it on a daily basis for at least 4 hours over the next month and then get a readout and return to the office.

## 2022-01-29 ENCOUNTER — Other Ambulatory Visit: Payer: Self-pay | Admitting: Psychiatry

## 2022-01-29 DIAGNOSIS — F5105 Insomnia due to other mental disorder: Secondary | ICD-10-CM

## 2022-01-30 ENCOUNTER — Ambulatory Visit: Payer: 59 | Admitting: Gastroenterology

## 2022-01-30 ENCOUNTER — Encounter: Payer: Self-pay | Admitting: Gastroenterology

## 2022-01-30 VITALS — BP 130/78 | HR 70 | Ht 75.0 in | Wt 251.0 lb

## 2022-01-30 DIAGNOSIS — K5080 Crohn's disease of both small and large intestine without complications: Secondary | ICD-10-CM | POA: Diagnosis not present

## 2022-01-30 DIAGNOSIS — K8689 Other specified diseases of pancreas: Secondary | ICD-10-CM | POA: Diagnosis not present

## 2022-01-30 NOTE — Progress Notes (Signed)
    Assessment     Pancreatic insufficiency Crohn's ileocolitis, mild  GERD Fecal incontinence    Recommendations    Continue Creon as prescribed and fat modified diet Continue budesonide 9 mg qd per patient preference Continue Imodium 1-2 q4h prn - adjust dosing to control diarrhea without constipation Begin pelvic floor PT as recommended REV in 6 months   HPI    This is a 62 year old male with Crohn's ileocolitis, pancreatic insufficiency. Apriso was discontinued due to concern it was exacerbating his diarrhea. He remains on budesonide per his preference. His diarrhea has improved substantially on Imodium. He has ongoing incontinence, stool leakage however these symptoms have improved along with his diarrhea. He has been referred for pelvic floor PT but has not started. He complained of abdominal cramping and was prescribed dicyclomine 2 weeks ago however the cramping has improved as his diarrhea improved. He has not used dicyclomine yet. He continues on Creon.    Labs / Imaging       Latest Ref Rng & Units 08/29/2021    1:00 PM 08/02/2021    4:37 PM 07/17/2021    4:03 PM  Hepatic Function  Total Protein 6.5 - 8.1 g/dL 7.0  7.2  7.1   Albumin 3.5 - 5.0 g/dL 4.0  4.5  4.4   AST 15 - 41 U/L 32  19  20   ALT 0 - 44 U/L 34  27  35   Alk Phosphatase 38 - 126 U/L 64  62  57   Total Bilirubin 0.3 - 1.2 mg/dL 0.8  0.6  0.4        Latest Ref Rng & Units 01/19/2022   12:11 PM 09/01/2021    8:44 PM 08/30/2021    5:32 AM  CBC  WBC 3.4 - 10.8 x10E3/uL 7.2  10.5  11.1   Hemoglobin 13.0 - 17.7 g/dL 15.4  13.7  13.4   Hematocrit 37.5 - 51.0 % 45.9  41.7  38.7   Platelets 150 - 450 x10E3/uL 347  349  331     Current Medications, Allergies, Past Medical History, Past Surgical History, Family History and Social History were reviewed in [employer] record.   Physical Exam: General: Well developed, well nourished, no acute distress Head: Normocephalic and  atraumatic Eyes: Sclerae anicteric, EOMI Ears: Normal auditory acuity Mouth: No deformities or lesions noted Lungs: Clear throughout to auscultation Heart: Regular rate and rhythm; No murmurs, rubs or bruits Abdomen: Soft, non tender and non distended. No masses, hepatosplenomegaly or hernias noted. Normal Bowel sounds Rectal: Not done Musculoskeletal: Symmetrical with no gross deformities  Pulses:  Normal pulses noted Extremities: No edema or deformities noted Neurological: Alert oriented x 4, grossly nonfocal Psychological:  Alert and cooperative. Normal mood and affect   Gertie Broerman T. Fuller Plan, MD 01/30/2022, 10:09 AM

## 2022-01-30 NOTE — Patient Instructions (Addendum)
Adjust Imodium as needed to help control diarrhea.   Follow up pelvic floor therapy  Follow up in 6 months or sooner if needed.   Thank you for choosing me and [facility].  Pricilla Riffle. Dagoberto Ligas., MD., Marval Regal

## 2022-02-05 ENCOUNTER — Ambulatory Visit: Payer: Self-pay | Admitting: Physician Assistant

## 2022-02-07 ENCOUNTER — Institutional Professional Consult (permissible substitution): Payer: 59 | Admitting: Family Medicine

## 2022-02-09 ENCOUNTER — Telehealth: Payer: Self-pay | Admitting: Gastroenterology

## 2022-02-09 NOTE — Telephone Encounter (Signed)
Returned call to patient. He called because he has not been able to schedule an appt for pelvic floor therapy. I informed patient that based on the referral they tried to reach him several times. I provided patient with the phone # to the [facility] office [phone]). I informed patient that he can call and get scheduled at his convenience. Pt wanted to know if he would be doing rehab indefinitely, I informed patient that he would not, and they can teach him exercises to complete at home so he does not have to miss work. Pt was thankful for the prompt return call and had no concerns at the end of the call.

## 2022-02-12 ENCOUNTER — Other Ambulatory Visit: Payer: Self-pay | Admitting: Family Medicine

## 2022-02-13 ENCOUNTER — Encounter: Payer: Self-pay | Admitting: Family Medicine

## 2022-02-17 ENCOUNTER — Other Ambulatory Visit: Payer: Self-pay | Admitting: Psychiatry

## 2022-02-17 DIAGNOSIS — E871 Hypo-osmolality and hyponatremia: Secondary | ICD-10-CM

## 2022-02-18 ENCOUNTER — Other Ambulatory Visit: Payer: Self-pay

## 2022-02-21 ENCOUNTER — Encounter: Payer: Self-pay | Admitting: Psychiatry

## 2022-02-21 ENCOUNTER — Ambulatory Visit (INDEPENDENT_AMBULATORY_CARE_PROVIDER_SITE_OTHER): Payer: 59 | Admitting: Psychiatry

## 2022-02-21 DIAGNOSIS — F331 Major depressive disorder, recurrent, moderate: Secondary | ICD-10-CM

## 2022-02-21 DIAGNOSIS — F422 Mixed obsessional thoughts and acts: Secondary | ICD-10-CM | POA: Diagnosis not present

## 2022-02-21 DIAGNOSIS — F4010 Social phobia, unspecified: Secondary | ICD-10-CM

## 2022-02-21 DIAGNOSIS — F5105 Insomnia due to other mental disorder: Secondary | ICD-10-CM

## 2022-02-21 DIAGNOSIS — F423 Hoarding disorder: Secondary | ICD-10-CM

## 2022-02-21 DIAGNOSIS — G4733 Obstructive sleep apnea (adult) (pediatric): Secondary | ICD-10-CM | POA: Diagnosis not present

## 2022-02-21 DIAGNOSIS — G3184 Mild cognitive impairment, so stated: Secondary | ICD-10-CM

## 2022-02-21 NOTE — Progress Notes (Signed)
Zachary [name] [id number] 1960/07/23 62 y.o.  Subjective:   Patient ID:  Zachary [name] is a 62 y.o. (DOB 09-09-1960) male.  Chief Complaint:  Chief Complaint  Patient presents with   Follow-up   Depression   Anxiety    Depression        Associated symptoms include decreased concentration.  Associated symptoms include no suicidal ideas.  Past medical history includes anxiety.   Anxiety Symptoms include decreased concentration and nervous/anxious behavior. Patient reports no confusion, dizziness, nausea or suicidal ideas.     Zachary [name] presents to the office today for follow-up of depression, obsessive anxiety and paranoia about body odor and getting dementia.  Did feel somewhat reassured about the fact that Dr. Marcos Eke said he does not have Alzheimer's dx.  visit October 16, 2018.  Initiated a trial of naltrexone off label 50 mg daily for impulse control purchasing.  01/16/2019 appointment with the following noted No benefit naltrexone.  Doesn't like his job but fears change in jobs bc "of the farting" and doesn't feel he'd be accepted in other places.  In this job for 16 years without a promotion.  There have bbeen layoffs and he hasn't gotten layoffs.   The same overall with mental health issues.  Nice new boss.   Worries over his thoughts of patient.  Mood stable.   Continued concerns over spending on things he doesn't need or even really want.  Books he'll never read or DVDs he'll never watch. Huge problem buying too much off the Internet.  Blew check from incentive on impulse purchase from the Internet.  Books and collectibles per usual.  Running up debt.  No other impulsivity.   Not hyper nor otherwise manic.    No history of bankruptcy.  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  Maybe out of boredom.  Needs to stop and can't control himself.Has not talked to anyone about it.  Doesn't get anxious around the spending.  Not depressed.  Busy at work.  Sleep fine.   Sometimes anxious about passing gas at work and not otherwise anxious there.  Rarely used Xanax.   Depression has remained under control. Overall he thinks the depression and anxiety levels have been "fine".  Friends notice his comprehension problems. Not sure he trusts the neuropsychological testing which was normal and did not show dementia as he fears.  Plan: No meds changed.  07/23/2019 appointment with the following noted: Concerns over concentration and memory.  Thinks he's having trouble with word-finding.  Bosses satisfied with work response and function. Plan: Check B12 and folated DT cognitive complaints. Trial reduction in olanzapine to see if cognition is better to 15 mg daily.  11/25/2019 appointment with the following noted: Thinks he had a lot of olanzapine 20 and kept taking it and never reduced it yet. No mental health changes since here.  Work very busy.  Regular anxiety thing but no depression.  Obsesses over farting and bothering people. Plan: Trial reduction in olanzapine to see if cognition is better to 15 mg daily.  03/22/2020 appointment with following noted: Might be a little better with cognition with the reduction in olanzapine.  No worse anxiety, fear, depression.  Worry is not worse.  Still sleeps well.   Overall things are fine in his mental health and function.  Still working.  Tired of the job and considering job at [employer].  Got confused by the online app and dropped it.  Work pretty busy.  Employee situation  stable.   Plan: Trial reduction in olanzapine to see if cognition is better to 15 mg daily may be succcessful and nothing is worse so no change in dose..  08/26/20 TC:  Pt did not give me any info other thren he can't concentrate enough to count to 30.I asked him did he make any med changes lately and he said he stopped taking meds for incontinence but that's all.I will have admins add him to the cancellation list,but he wants to know if anything can be done about  the concentration.  09/22/20  appt noted: Hard time at work.  More trouble with concentration and memory.  Seemed worse and went to PCP and lab work up.  Normal B12, TSH, CBC, RPR. Head MRI mild atrophy. 08/30/20  Na 128 Was told to discuss psych meds and low sodium.   Sunday had a few hours of giddiness and then again yesterday and doesn't remember feeling that good as an adult.  Had not changed meds or used drugs.   Plan: Reduce fluvoxamine to 3 at night Stop lithium Add salt tablet 1 twice daily with food Repeat sodium level in 1 week. Sent message to patient's primary care physician regarding the possibility of stopping hydrochlorothiazide which could be contributing to the low sodium.  10/19/2020 appointment with the following noted: Just got sodium yesterday so hasn't repeated level.  HCTZ stopped. Didn't stop lithium. Says he still has concentration problems.  Had a hard time counting to 50.   Still working and goes slow but getting things done eventually.   Plan: Disc recentPatient had a recent episode of hyponatremia with some mental status changes.  The mental status changes have resolved.  We discussed the possibility that virtually every psychiatric medicine can cause low sodium as can hydrochlorothiazide.  Typically SSRIs are the most common cause.  We discussed the risk of reducing his psychiatric medication.  However the hyponatremia must be addressed. HCTZ was stopped, luvox reduced to 300 and salt added yesterday Repeat sodium in 1 week.  11/16/2020 phone call the patient from MD: He has a recent history of low sodium.  Tell him his sodium level is totally normal now at 137.  He can probably drop to 1 salt tablet daily at this point.  No further med changes are necessary.  12/29/2020 appointment with the following noted: Worrying about hoarding which has gotten really bad in the last few weeks.  Since father died have access to more money than I need.  Buy more than 1 of the  same item: Alfonso Patten dolls, [place] and glassware.   Got inheritance trust fund.   On med for Crohn's dz and believes he still smells and worries over it. Plan: Hoarding worsened since Luvox reduced to 300 mg daily so increase to 400 mg daily.  07/13/2021 appointment with the following noted: He continues buspirone 30 mg twice daily, Aricept 10 mg daily, fluvoxamine 400 mg daily, lamotrigine 200 mg daily, lithium 150 nightly, olanzapine 15 mg nightly. Not good.  3 mos ago fell off truck and landed on back and broke foot.  Laid up for few weeks and got depressed.  Since then has developed fecal incontinence.  Not sure how to deal with it.  Seeing GI doc. Had anxiety attack and had to leave work and go home.  Was triggered by obsessing on health issues. Asks for prn for anxiety.   apptetite is less but not sure about wt changes Had home sleep study for OSA  but no results yet. Generally worried about health. Patient denies difficulty with sleep initiation or maintenance over 8 hours.   Patient denies any suicidal ideation.  10/02/21 appt noted: Had suicide attempt 08/29/21 on OD 30-40 fluvoxamine after failed attempt to pursue relationship.  Triggered extreme guilt and illogical paranoia of being arrested.  Realizes now he did nothing wrong and fear of arrest were illogical.  Totally different now and feels much better.   Had been declining in mental health including before that  including not eating in 3 days befor eand neglecting hygiene.   Trouble sleeping with one good night per week.  Today slept 8P-1AM he thinks 5-6 hours of sleep lately. Has looked at YouTube all night but not doing it now.  Trying to make better decisions.   Glad he didn't die.  No SI since then.  Not depressed but tired.   Has been consistent with meds.  No SE. Less caffeine than in the past.  No tea or coffee.   No recreational drugs Fluvoxamine reduced to 150 mg after hospital stay and anxiety and hoarding are not  worse. Plan: buspirone 30 mg twice daily, Aricept 10 mg daily, fluvoxamine 150 mg daily, lamotrigine 200 mg daily, lithium 150 nightly, olanzapine 15 mg nightly. Schedule with therapist   12/13/21 appt noted:  appt moved eariler Pych meds: buspar 30 BID, fluvox 400, olanzapine 15, lamotrigine 200, donepezil 10.  No Abilify Before SA had previews of problems.  He's noticing that again including less attention to hygiene and care of apt.  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  Can afford what he is buying.  Taking fluvoxamine 400 mg since hosp stay and not just recently. Doesn't feel sad but tired all the time and has to foce himself to eat at times. Not markely anxious.  Not so much about worry about passing gas but about his rectal problem.   No other fear. Does worry over DM No SI.  Has not stockpiled meds. Work function is pretty good.  Not making mistakes.   Thinks boss values him.   Has sleep apnea but not using CPAP.  Enjoys his sleep.  A little drowsy daytime.  In be 9-6 AM. Doesn't know how to use the machine properly. Plan: no med changes Cognitve complaints could be related to untreated OSA. 07/01/21 sleep study: AHI 41 This being untreated is likely causing current sx.  Desperately needs to start using CPAP.  Call [facility] and get help using the machine.    02/21/22 appt noted: Sleep horrible.  On CPAP now.  Hard to get up in the AM.   Does drift off to sleep better. Not restful sleep.  More tired than ever.  Doctor is following up on this. Many mos since cleaned apt.  Not interested.   I think I might be a perfectionist and don't clean bc won't do it as well as he should so doesn't try.   Still buying things he doesn't need.  Haven't spent money he doesn't have.  Will lose interest in collections and then get rid of them.  Get joy out of buying them.   Some chronic depression but not severe and no SI Anxiety at baseline.  F has unknown cancer in chemo in early 81's.   Talk every other day.    Past psych med: Abilify, Zyprexa, Seroquel 600, olanzapine 30 Wellbutrin, nortriptyline, clomipramine, mirtazapine,  fluvoxamine 400, Lamotrigine,  lithium no response,  stimulants, Aricept  buspirone 60,   Xanax  Naltrexone for compulsive buying NR  Review of Systems:  Review of Systems  Respiratory:  Negative for cough.   Gastrointestinal:  Positive for diarrhea. Negative for abdominal distention, nausea and vomiting.       Complaining chronic gas issues he finds embarrassing.  Musculoskeletal:  Positive for back pain.  Neurological:  Negative for dizziness and tremors.  Psychiatric/Behavioral:  Positive for decreased concentration and dysphoric mood. Negative for agitation, behavioral problems, confusion, hallucinations, self-injury, sleep disturbance and suicidal ideas. The patient is nervous/anxious. The patient is not hyperactive.     Medications: I have reviewed the patient's current medications.  Current Outpatient Medications  Medication Sig Dispense Refill   aspirin 81 MG EC tablet Take 81 mg by mouth daily. Swallow whole.     busPIRone (BUSPAR) 30 MG tablet TAKE 1 TABLET(30 MG) BY MOUTH TWICE DAILY 180 tablet 0   dicyclomine (BENTYL) 10 MG capsule Take 1 capsule (10 mg total) by mouth 3 (three) times daily as needed (abdominal pain or cramping). 90 capsule 2   donepezil (ARICEPT) 10 MG tablet Take 1 tablet (10 mg total) by mouth at bedtime. 90 tablet 3   fluvoxaMINE (LUVOX) 100 MG tablet Take 4 tablets (400 mg total) by mouth at bedtime. 120 tablet 2   lamoTRIgine (LAMICTAL) 200 MG tablet TAKE 1 TABLET(200 MG) BY MOUTH DAILY 90 tablet 0   levothyroxine (SYNTHROID) 75 MCG tablet TAKE 1 TABLET(75 MCG) BY MOUTH DAILY 90 tablet 0   lipase/protease/amylase (CREON) 36000 UNITS CPEP capsule TAKE 4 CAPSULES BY MOUTH BEFORE A MEAL AND 2 WITH EACH SNACK 1440 capsule 1   loperamide (IMODIUM) 2 MG capsule Take 1 capsule (2 mg total) by mouth every 4 (four) hours. As needed 60 capsule 2    losartan (COZAAR) 100 MG tablet Take 1 tablet (100 mg total) by mouth daily.     mupirocin ointment (BACTROBAN) 2 % Apply 1 Application topically 2 (two) times daily. 22 g 0   OLANZapine (ZYPREXA) 15 MG tablet TAKE 1 TABLET(15 MG) BY MOUTH AT BEDTIME 90 tablet 0   pantoprazole (PROTONIX) 40 MG tablet Take 1 tablet (40 mg total) by mouth daily at 12 noon.     sodium chloride 1 g tablet TAKE 1 TABLET(1 GRAM) BY MOUTH DAILY 90 tablet 0   budesonide (ENTOCORT EC) 3 MG 24 hr capsule TAKE 3 CAPSULES BY MOUTH EVERY DAY (Patient not taking: Reported on 02/21/2022) 90 capsule 5   emtricitabine-tenofovir (TRUVADA) 200-300 MG tablet TAKE 1 TABLET BY MOUTH DAILY (Patient not taking: Reported on 02/21/2022) 30 tablet 0   No current facility-administered medications for this visit.    Medication Side Effects: None  Allergies:  Allergies  Allergen Reactions   Erythromycin Other (See Comments)    Patient said he is "allergic," but the reaction was not cited   Penicillins Other (See Comments)    "It may kill me"   Tetracyclines & Related Hives    Past Medical History:  Diagnosis Date   Anxiety    Crohn disease ([place])    pt reports subsequent testing was normal   Depression    GERD (gastroesophageal reflux disease)    Herpes simplex    Hoarding disorder    Hyperlipidemia    Hypertension    IBS (irritable bowel syndrome)    Incontinence     Family History  Problem Relation Age of Onset   Arthritis Mother    Stroke Mother    Depression Mother    Hypertension Father  Prostate cancer Father 95   Breast cancer Maternal Grandmother    Cancer Maternal Grandfather        unknown type   Cancer Paternal Grandmother        unknown type   Lung cancer Maternal Aunt    Prostate cancer Paternal Uncle    Prostate cancer Paternal Uncle    Colon cancer Neg Hx    Rectal cancer Neg Hx    Stomach cancer Neg Hx    Esophageal cancer Neg Hx    Pancreatic cancer Neg Hx     Social History    Socioeconomic History   Marital status: Single    Spouse name: Not on file   Number of children: Not on file   Years of education: Not on file   Highest education level: Not on file  Occupational History   Not on file  Tobacco Use   Smoking status: Former    Types: Cigarettes    Quit date: 1980    Years since quitting: 44.1   Smokeless tobacco: Never  Vaping Use   Vaping Use: Never used  Substance and Sexual Activity   Alcohol use: No   Drug use: No   Sexual activity: Not Currently  Other Topics Concern   Not on file  Social History Narrative   Lives alone.     Social Determinants of Health   Financial Resource Strain: Not on file  Food Insecurity: Not on file  Transportation Needs: Not on file  Physical Activity: Not on file  Stress: Not on file  Social Connections: Not on file  Intimate Partner Violence: Not on file    Past Medical History, Surgical history, Social history, and Family history were reviewed and updated as appropriate.   Please see review of systems for further details on the patient's review from today.   Objective:   Physical Exam:  There were no vitals taken for this visit.  Physical Exam Constitutional:      General: He is not in acute distress.    Appearance: He is well-developed.  Musculoskeletal:        General: No deformity.  Neurological:     Mental Status: He is alert and oriented to person, place, and time.     Motor: No tremor.     Coordination: Coordination normal. Heel to Shin Test normal.  Psychiatric:        Attention and Perception: Attention normal. He is attentive.        Mood and Affect: Mood is anxious and depressed. Affect is not labile, blunt, angry or inappropriate.        Speech: Speech is not rapid and pressured, delayed or slurred.        Behavior: Behavior normal. Behavior is not agitated or hyperactive.        Thought Content: Thought content is not paranoid or delusional. Thought content does not include  homicidal or suicidal ideation. Thought content does not include suicidal plan.        Cognition and Memory: Cognition normal.        Judgment: Judgment is not inappropriate.     Comments: Insight is fair. Denies sadness Igained wt since here. Looks sleep deprived.       Lab Review:     Component Value Date/Time   NA 145 09/03/2021 0645   NA 139 06/15/2021 1236   K 3.7 09/03/2021 0645   CL 108 09/03/2021 0645   CO2 29 09/03/2021 0645   GLUCOSE 96 09/03/2021  0645   BUN 9 09/03/2021 0645   BUN 14 06/15/2021 1236   CREATININE 1.12 09/03/2021 0645   CREATININE 1.13 10/28/2020 1708   CALCIUM 8.9 09/03/2021 0645   PROT 7.0 08/29/2021 1300   PROT 6.5 08/30/2020 1325   ALBUMIN 4.0 08/29/2021 1300   ALBUMIN 4.4 08/30/2020 1325   AST 32 08/29/2021 1300   ALT 34 08/29/2021 1300   ALKPHOS 64 08/29/2021 1300   BILITOT 0.8 08/29/2021 1300   BILITOT 0.5 08/30/2020 1325   GFRNONAA >60 09/03/2021 0645   GFRAA 90 09/11/2018 1120       Component Value Date/Time   WBC 7.2 01/19/2022 1211   WBC 10.5 09/01/2021 2044   RBC 5.08 01/19/2022 1211   RBC 4.33 09/01/2021 2044   HGB 15.4 01/19/2022 1211   HCT 45.9 01/19/2022 1211   PLT 347 01/19/2022 1211   MCV 90 01/19/2022 1211   MCH 30.3 01/19/2022 1211   MCH 31.6 09/01/2021 2044   MCHC 33.6 01/19/2022 1211   MCHC 32.9 09/01/2021 2044   RDW 12.6 01/19/2022 1211   LYMPHSABS 1.8 01/19/2022 1211   MONOABS 0.9 09/01/2021 2044   EOSABS 0.3 01/19/2022 1211   BASOSABS 0.1 01/19/2022 1211    Lithium Lvl  Date Value Ref Range Status  08/30/2021 <0.06 (L) 0.60 - 1.20 mmol/L Final    Comment:    Performed at [facility], [place] [address] [place], [place] [address]     10/26/2019 B12 680  07/01/21 sleep study: AHI 41  No results found for: "PHENYTOIN", "PHENOBARB", "VALPROATE", "CBMZ"   .res Assessment: Plan:    Dwaine was seen today for follow-up, depression and anxiety.  Diagnoses and all orders for this  visit:  Major depressive disorder, recurrent episode, moderate (HCC)  Mixed obsessional thoughts and acts  OSA (obstructive sleep apnea)  Social anxiety disorder  Mild cognitive impairment  Hoarding disorder  Insomnia due to mental condition   Greater than 50% of 30 min face to face time with patient was spent on counseling and coordination of care. We discussed [name] has been diagnosed with treatment resistant depression with psychotic features, OCD and social anxiety but schizoaffective may be more appropriate diagnosis given this chronic paranoid thinking.  Specifically he is chronically  suspicious that he has dementia despite neuropsychological testing which tells him he does not.  He also has chronic thoughts that other people find his odor offensive because of passing gas.  There is very little evidence to support this.  He has been under my psychiatric care for many many years and never owed noticed body odor and appointments at any time.  He also suspects that people are thinking negatively of him regularly.  Overall his paranoia and anxiety and depression are unchanged with reduction in olanzapine to 40m daily months ago .    However he had an event which triggered embarrassment and then paranoia and a suicide attempt at the end of August.  He has had no further suicidal ideation and is not depressed.  We processed the event.  Cognitve complaints could be related to untreated OSA. Disc risk factor for dementia.   07/01/21 sleep study: AHI 41 This being untreated is likely causing current sx.  Desperately needs to start using CPAP.  Call [facility] and get help using the machine.   [facility] ? Closes 5?PM  [phone] Hx hyponatremia. Normal 09/2021  Discussed potential metabolic side effects associated  with atypical antipsychotics, as well as potential risk for movement side effects. Advised pt to contact office if movement side effects  occur.  No evidence of movement disorder. Still obsessing on farting and thinking others notice but it's never been noticeable in years of seeing him and he never gets complaints from other about it.  Discussed the risk of polypharmacy but again if appears medically necessary, helpful and well tolerated.  No med changes. buspirone 30 mg twice daily, , fluvoxamine 400 mg daily, lamotrigine 200 mg daily, lithium 150 nightly, olanzapine 15 mg nightl he restarted Aricept 10 mg daily.  Schedule with therapist .  No good med options for hoarding left.  Disc 12 step program.  FU 8 weeks  Lynder Parents, MD, DFAPA  Please see After Visit Summary for patient specific instructions.  Future Appointments  Date Time Provider [place]  02/28/2022  4:15 PM Jule Economy, PT [facility] None  09/26/2022  8:30 AM Denita Lung, MD [facility]    No orders of the defined types were placed in this encounter.       -------------------------------

## 2022-02-28 ENCOUNTER — Ambulatory Visit: Payer: 59 | Attending: Gastroenterology

## 2022-02-28 ENCOUNTER — Other Ambulatory Visit: Payer: Self-pay

## 2022-02-28 DIAGNOSIS — R279 Unspecified lack of coordination: Secondary | ICD-10-CM | POA: Insufficient documentation

## 2022-02-28 DIAGNOSIS — M6281 Muscle weakness (generalized): Secondary | ICD-10-CM | POA: Insufficient documentation

## 2022-02-28 DIAGNOSIS — N3941 Urge incontinence: Secondary | ICD-10-CM | POA: Diagnosis present

## 2022-02-28 DIAGNOSIS — R151 Fecal smearing: Secondary | ICD-10-CM | POA: Diagnosis present

## 2022-02-28 DIAGNOSIS — R293 Abnormal posture: Secondary | ICD-10-CM | POA: Insufficient documentation

## 2022-02-28 DIAGNOSIS — R159 Full incontinence of feces: Secondary | ICD-10-CM | POA: Diagnosis not present

## 2022-02-28 NOTE — Patient Instructions (Signed)
Squatty potty: When your knees are level or below the level of your hips, pelvic floor muscles are pressed against rectum, preventing ease of bowel movement. By getting knees above the level of the hips, these pelvic floor muscles relax, allowing easier passage of bowel movement. Ways to get knees above hips: o Squatty Potty (7inch and 9inch versions) o Small stool o Roll of toilet paper under each foot o Hardback book or stack of magazines under each foot  Relaxed Toileting mechanics: Once in this position, make sure to lean forward with forearms on thighs, wide knees, relaxed stomach, and breathe.   Bowel massage: To assist with more regular and more comfortable bowel movements, try performing bowel massage nightly for 5-10 minutes. Place hands in the lower right side of your abdomen to start; in small circles, massage up, across, and down the left side of your abdomen. Pressure does not need to be hard, but just comfortable. You can use lotion or oil to make more comfortable.   Balloon Breathing: If you feel like you have to strain when having a bowel movement, blow out like you're trying to blow of a balloon.    [place] [address] [place], [place] [address] Phone # [phone] Fax [phone]

## 2022-02-28 NOTE — Therapy (Signed)
OUTPATIENT PHYSICAL THERAPY MALE PELVIC EVALUATION   Patient Name: Zachary [name] MRN: [id number] DOB:05/21/1960, 62 y.o., male Today's Date: 02/28/2022  END OF SESSION:  PT End of Session - 02/28/22 1719     Visit Number 1    Date for PT Re-Evaluation 08/15/22    Authorization Type [employer]    PT Start Time [id number]    PT Stop Time 1651    PT Time Calculation (min) 36 min    Activity Tolerance Patient tolerated treatment well    Behavior During Therapy WFL for tasks assessed/performed             Past Medical History:  Diagnosis Date   Anxiety    Crohn disease ([place])    pt reports subsequent testing was normal   Depression    GERD (gastroesophageal reflux disease)    Herpes simplex    Hoarding disorder    Hyperlipidemia    Hypertension    IBS (irritable bowel syndrome)    Incontinence    Past Surgical History:  Procedure Laterality Date   APPENDECTOMY     COLONOSCOPY     POLYPECTOMY     TWISTED TESTICLES  1970   [place]     Patient Active Problem List   Diagnosis Date Noted   Mixed obsessional thoughts and acts 09/04/2021   MDD (major depressive disorder), recurrent episode, severe ([place]) 08/31/2021   History of drug overdose 08/30/2021   Tachycardia 06/15/2021   OSA (obstructive sleep apnea) 06/15/2021   Family history of prostate cancer 09/13/2020   Absence of bladder continence 08/30/2020   High risk medication use 08/30/2020   Hypothyroidism 08/30/2020   Incontinence of feces 11/03/2019   OAB (overactive bladder) 10/26/2019   Pancreatic insufficiency 10/26/2019   Spinal stenosis of lumbar region 03/03/2018   Essential hypertension 01/27/2018   OCD (obsessive compulsive disorder) 10/10/2017   Social anxiety disorder 10/10/2017   Seasonal allergies 06/26/2010   History of herpes labialis 06/26/2010   FLATULENCE ERUCTATION AND GAS PAIN 09/23/2007   Major depression 09/22/2007   CROHN'S DISEASE, LARGE AND SMALL INTESTINES  09/22/2007   COLONIC POLYPS, HYPERPLASTIC, HX OF 09/22/2007    PCP: Denita Lung, MD  REFERRING PROVIDER: Ladene Artist, MD  REFERRING DIAG: R15.9 (ICD-10-CM) - Incontinence of feces, unspecified fecal incontinence type  THERAPY DIAG:  Abnormal posture - Plan: PT plan of care cert/re-cert  Muscle weakness (generalized) - Plan: PT plan of care cert/re-cert  Unspecified lack of coordination - Plan: PT plan of care cert/re-cert  Fecal smearing - Plan: PT plan of care cert/re-cert  Urge incontinence - Plan: PT plan of care cert/re-cert  Rationale for Evaluation and Treatment: Rehabilitation  ONSET DATE: 6 months  SUBJECTIVE:  02/28/22 SUBJECTIVE STATEMENT: For the last couple of years, patient reports full fecal incontinence. However, this is better, and now he is more concerned with small amount of fecal smearing. He was diagnosed with Crohn's disease in the 38s. He states that his diet is very poor. He has been on the imodium for 2 months now and feels like fecal smearing has improved.  Fluid intake: more than 8 glasses of water  PAIN:  Are you having pain? Yes NPRS scale: 7/10 at worst Pain location:  abdominal pain, Bil lower quadrant  Pain type: cramping Pain description: intermittent   Aggravating factors: unsure Relieving factors: sometimes mint  PRECAUTIONS: None  WEIGHT BEARING RESTRICTIONS: No  FALLS:  Has patient fallen in last 6 months? No  LIVING ENVIRONMENT: Lives with: lives with their family and lives alone Lives in: House/apartment   OCCUPATION: Geographical information systems officer   PLOF: Independent  PATIENT GOALS: avoid severe bowel incontinence when he gets older  PERTINENT HISTORY:  Crohn's disease; appendectomy, umbilical hernia repair   BOWEL MOVEMENT: Pain with  bowel movement: Yes Type of bowel movement:Type (Bristol Stool Scale) 7, Frequency 2x/week, and Strain Yes Fully empty rectum: Yes: unsure Leakage: Yes: fecal smearing Pads: No Fiber supplement: No  URINATION: Pain with urination: No Fully empty bladder: No, will leak after he is done urinating Stream: Strong Urgency: Yes: - Frequency: no idea Leakage: Urge to void Pads: No  INTERCOURSE: Pain with intercourse:  none History of pain with morning erection   OBJECTIVE:   02/28/22:  COGNITION: Overall cognitive status: Within functional limits for tasks assessed     SENSATION: Light touch: Appears intact Proprioception: Appears intact    GAIT: Comments: Flexed forward posture, decreased bil hip extension  POSTURE: rounded shoulders, forward head, increased thoracic kyphosis, and posterior pelvic tilt  PELVIC ALIGNMENT:  LUMBARAROM/PROM:    LOWER EXTREMITY AROM/PROM:    LOWER EXTREMITY MMT:   PALPATION: GENERAL NA              External Perineal Exam external hemorrhoids               Internal Pelvic Floor Tenderness and sensation of bowel movement with palpation of puborectalis, Lt>Rt Patient confirms identification and approves PT to assess internal pelvic floor and treatment Yes  PELVIC MMT:   MMT eval  Internal Anal Sphincter 3/5, >10 sec endurance, 12 repeat contraction with moderate level of coordination   External Anal Sphincter 3/5  Puborectalis 3/5  Diastasis Recti Abdominal distortion noted with bed mobility  (Blank rows = not tested)  TONE: High  TODAY'S TREATMENT:                                                                                                                              DATE:  02/28/22  EVAL  Therapeutic activities: Squatty potty, relaxed toilet mechanics Bowel massage Balloon Breathing    PATIENT EDUCATION:  Education details: see above Person educated: Patient Education  method: Explanation, Demonstration,  Tactile cues, Verbal cues, and Handouts Education comprehension: verbalized understanding  HOME EXERCISE PROGRAM: Written handout  ASSESSMENT:  CLINICAL IMPRESSION: Patient is a 62 y.o. male who was seen today for physical therapy evaluation and treatment for fecal smearing and urinary incontinence. Exam findings notable for abnormal posture, mild pelvic floor strength/endurance/coordination deficits, high pelvic floor tone, and abdominal weakness. Signs and symptoms are most consistent with constipation, poor evacuation habits, and lifestyle; believe fecal smearing and urinary incontinence will resolve if he can improve diet, be able to completely void/evacuate, and improve pelvic floor tone. Initial treatment included lifestyle modifications of squatty potty, relaxed toilet mechanics, balloon breathing, and self-bowel massage. He will benefit from skilled PT intervention in order to resolve fecal smearing and urinary incontinence.   OBJECTIVE IMPAIRMENTS: decreased activity tolerance, decreased coordination, decreased endurance, decreased mobility, decreased strength, increased fascial restrictions, increased muscle spasms, impaired tone, postural dysfunction, and pain.   ACTIVITY LIMITATIONS: continence  PARTICIPATION LIMITATIONS: community activity and occupation  PERSONAL FACTORS: Fitness and diet  are also affecting patient's functional outcome.   REHAB POTENTIAL: Good  CLINICAL DECISION MAKING: Stable/uncomplicated  EVALUATION COMPLEXITY: Low   GOALS: Goals reviewed with patient? Yes  SHORT TERM GOALS: Target date: 04/04/22  Pt will be independent with HEP.   Baseline: Goal status: INITIAL  2.  Pt will be independent with diaphragmatic breathing and down training activities in order to improve pelvic floor relaxation.  Baseline:  Goal status: INITIAL  3.  Pt will be independent with use of squatty potty, relaxed toileting mechanics, and improved bowel movement techniques  in order to increase ease of bowel movements and complete evacuation.   Baseline:  Goal status: INITIAL    LONG TERM GOALS: Target date: 08/15/22  Pt will be independent with advanced HEP.   Baseline:  Goal status: INITIAL  2.  Pt will report no episodes of urinary or fecal incontinence in order to improve confidence in community activities and personal hygiene.   Baseline:  Goal status: INITIAL  3.  Pt will report >4 bowel movements a week in order to reduce back up of stool that is causing fecal smearing.  Baseline:  Goal status: INITIAL  4.  Pt will demonstrate improved pelvic floor muscle tone in order to be able to completely void/evacuate without straining.  Baseline:  Goal status: INITIAL  5.  Pt will demonstrate normal pelvic floor muscle tone and A/ROM, able to achieve 4/5 strength with contractions and 10 sec endurance, in order to provide appropriate lumbopelvic support in functional activities.   Baseline:  Goal status: INITIAL  6.  Pt will report no greater than 3/10 bil abdominal pain.  Baseline:  Goal status: INITIAL   PLAN:  PT FREQUENCY: 1-2x/week  PT DURATION: 6 months  PLANNED INTERVENTIONS: Therapeutic exercises, Therapeutic activity, Neuromuscular re-education, Balance training, Gait training, Patient/Family education, Self Care, Joint mobilization, Dry Needling, Biofeedback, and Manual therapy  PLAN FOR NEXT SESSION: Down training/mobility exercises; bowel mobilization; pelvic floor release.    Heather Roberts, PT, DPT02/28/245:31 PM

## 2022-03-05 ENCOUNTER — Encounter: Payer: Self-pay | Admitting: Physician Assistant

## 2022-03-12 ENCOUNTER — Telehealth: Payer: Self-pay | Admitting: Family Medicine

## 2022-03-12 NOTE — Telephone Encounter (Signed)
Pt came in and dropped off cpap compliance report. Sending back to [place] to review.

## 2022-03-14 NOTE — Telephone Encounter (Signed)
I called patient to see how he felt since using CPAP. Patient says he feels worse. He says he can't sleep comfortably with the equipment. He says he often feels fatigue the next morning after sleeping with the CPAP.

## 2022-03-15 ENCOUNTER — Other Ambulatory Visit: Payer: Self-pay | Admitting: Family Medicine

## 2022-03-15 DIAGNOSIS — E039 Hypothyroidism, unspecified: Secondary | ICD-10-CM

## 2022-03-15 NOTE — Telephone Encounter (Signed)
Tried calling patient. Left message to call back. 

## 2022-03-16 NOTE — Telephone Encounter (Signed)
Left detailed message for pt to stop using it if it makes him feel bad

## 2022-03-21 ENCOUNTER — Encounter: Payer: Self-pay | Admitting: Family Medicine

## 2022-03-21 ENCOUNTER — Ambulatory Visit: Payer: 59 | Admitting: Family Medicine

## 2022-03-21 VITALS — BP 132/88 | HR 76 | Temp 97.5°F | Resp 18 | Wt 240.0 lb

## 2022-03-21 DIAGNOSIS — G4733 Obstructive sleep apnea (adult) (pediatric): Secondary | ICD-10-CM | POA: Diagnosis not present

## 2022-03-21 NOTE — Progress Notes (Signed)
   Subjective:    Patient ID: Zachary [name], male    DOB: 13-May-1960, 62 y.o.   MRN: [id number]  HPI He is here for consult concerning OSA.  He did use a CPAP and according to the readout satisfy the criteria based on that however he states that his sleeping patterns was actually worse.  He is here to discuss further options.   Review of Systems     Objective:   Physical Exam  Alert and in no distress otherwise not examined      Assessment & Plan:  OSA (obstructive sleep apnea) - Plan: Ambulatory referral to ENT Since he did not respond to CPAP, referral to ENT for the possibility of inspire is certainly reasonable.

## 2022-03-22 ENCOUNTER — Telehealth: Payer: Self-pay

## 2022-03-22 NOTE — Telephone Encounter (Signed)
Patient advised.

## 2022-03-22 NOTE — Telephone Encounter (Signed)
Pt called and advised he has been having trouble thinking. Specifically, sometimes he has trouble thinking of what he is about to say. Wants to know if you think it would be good for him to increase the sodium chloride dosage.

## 2022-03-23 ENCOUNTER — Other Ambulatory Visit: Payer: Self-pay | Admitting: Psychiatry

## 2022-03-23 DIAGNOSIS — F331 Major depressive disorder, recurrent, moderate: Secondary | ICD-10-CM

## 2022-04-06 ENCOUNTER — Other Ambulatory Visit: Payer: Self-pay | Admitting: Psychiatry

## 2022-04-06 DIAGNOSIS — F5105 Insomnia due to other mental disorder: Secondary | ICD-10-CM

## 2022-04-09 ENCOUNTER — Encounter: Payer: Self-pay | Admitting: Psychiatry

## 2022-04-09 ENCOUNTER — Telehealth: Payer: Self-pay | Admitting: Family Medicine

## 2022-04-09 ENCOUNTER — Ambulatory Visit: Payer: 59 | Admitting: Medical

## 2022-04-09 ENCOUNTER — Ambulatory Visit (INDEPENDENT_AMBULATORY_CARE_PROVIDER_SITE_OTHER): Payer: 59 | Admitting: Psychiatry

## 2022-04-09 VITALS — BP 124/82 | HR 70 | Wt 238.0 lb

## 2022-04-09 DIAGNOSIS — G3184 Mild cognitive impairment, so stated: Secondary | ICD-10-CM

## 2022-04-09 DIAGNOSIS — T148XXA Other injury of unspecified body region, initial encounter: Secondary | ICD-10-CM

## 2022-04-09 DIAGNOSIS — F4010 Social phobia, unspecified: Secondary | ICD-10-CM | POA: Diagnosis not present

## 2022-04-09 DIAGNOSIS — F423 Hoarding disorder: Secondary | ICD-10-CM

## 2022-04-09 DIAGNOSIS — G4733 Obstructive sleep apnea (adult) (pediatric): Secondary | ICD-10-CM

## 2022-04-09 DIAGNOSIS — F422 Mixed obsessional thoughts and acts: Secondary | ICD-10-CM | POA: Diagnosis not present

## 2022-04-09 DIAGNOSIS — F5105 Insomnia due to other mental disorder: Secondary | ICD-10-CM

## 2022-04-09 DIAGNOSIS — F331 Major depressive disorder, recurrent, moderate: Secondary | ICD-10-CM | POA: Diagnosis not present

## 2022-04-09 DIAGNOSIS — L84 Corns and callosities: Secondary | ICD-10-CM | POA: Diagnosis not present

## 2022-04-09 DIAGNOSIS — R002 Palpitations: Secondary | ICD-10-CM

## 2022-04-09 MED ORDER — MUPIROCIN 2 % EX OINT: 1.0000 | TOPICAL_OINTMENT | Freq: Two times a day (BID) | CUTANEOUS | 1 refills | Status: DC

## 2022-04-09 MED ORDER — LOSARTAN POTASSIUM 100 MG PO TABS: 100.0000 mg | ORAL_TABLET | Freq: Every day | ORAL | 1 refills | Status: DC

## 2022-04-09 NOTE — Telephone Encounter (Signed)
done
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
## 2022-04-09 NOTE — Progress Notes (Signed)
Subjective:  Zachary [name] is a 62 y.o. male who presents for Chief Complaint  Patient presents with   blister on foot    Blister on foot, noticed it last week     Here for blister of toe.  He first noticed this blister on his left great toe last week.  He is looks like he has blood up under the blister.  No pain, no trauma or injury.  Just wants it checked out.  He has not ruptured the blister.  No other aggravating or relieving factors.    No other c/o.  The following portions of the patient's history were reviewed and updated as appropriate: allergies, current medications, past family history, past medical history, past social history, past surgical history and problem list.  ROS Otherwise as in subjective above    Objective: BP 124/82   Pulse 70   Wt 238 lb (108 kg)   BMI 29.75 kg/m   General appearance: alert, no distress, well developed, well nourished Skin: The left medial great toe at the PIP with a thick callus inferiorly and above that is a round approximately 1.3 cm bolus erythematous lesion suggesting a blood blister, but nontender without induration or warmth, hypertrophic toenails in general MSK: Great toe and other toes with normal range of motion without tenderness.  No swelling  Pulses: 2+ radial pulses, 2+ pedal pulses, normal cap refill Ext: no edema   Assessment: Encounter Diagnoses  Name Primary?   Blood blister Yes   Callus      Plan: We discussed the blister.  He has a callus under the blister.  I suspect he is getting some frictional trauma to the toe shoe wear.  I advise he not rupture the blister.  Begin antibiotic ointment topically with a Band-Aid and gauze over top to protect or cushion the area.  Practice good hygiene with soap and water.  In general consider wearing some different shoes that are more supportive along with thicker socks for the time being.  Blister should resolve gradually on its own.  No concern for other more serious  etiology.  No need for imaging today.  If any changes such as redness, pain or other worrisome changes, then call or recheck   Zachary [name] was seen today for blister on foot.  Diagnoses and all orders for this visit:  Blood blister  Callus  Other orders -     mupirocin ointment (BACTROBAN) 2 %; Apply 1 Application topically 2 (two) times daily.   Follow up: prn

## 2022-04-09 NOTE — Telephone Encounter (Signed)
Pt called and is requesting a refill for his losartan Please send refill to a new pharmacy  [facility] [place] on [address]

## 2022-04-18 ENCOUNTER — Other Ambulatory Visit: Payer: Self-pay | Admitting: Psychiatry

## 2022-04-18 ENCOUNTER — Other Ambulatory Visit: Payer: Self-pay | Admitting: Gastroenterology

## 2022-04-18 DIAGNOSIS — F4010 Social phobia, unspecified: Secondary | ICD-10-CM

## 2022-04-18 DIAGNOSIS — F331 Major depressive disorder, recurrent, moderate: Secondary | ICD-10-CM

## 2022-04-18 DIAGNOSIS — F422 Mixed obsessional thoughts and acts: Secondary | ICD-10-CM

## 2022-04-19 ENCOUNTER — Ambulatory Visit: Payer: 59 | Attending: Gastroenterology

## 2022-04-19 DIAGNOSIS — R293 Abnormal posture: Secondary | ICD-10-CM | POA: Diagnosis present

## 2022-04-19 DIAGNOSIS — R279 Unspecified lack of coordination: Secondary | ICD-10-CM | POA: Diagnosis present

## 2022-04-19 DIAGNOSIS — R151 Fecal smearing: Secondary | ICD-10-CM | POA: Insufficient documentation

## 2022-04-19 DIAGNOSIS — N3941 Urge incontinence: Secondary | ICD-10-CM | POA: Insufficient documentation

## 2022-04-19 DIAGNOSIS — M6281 Muscle weakness (generalized): Secondary | ICD-10-CM | POA: Insufficient documentation

## 2022-04-19 NOTE — Patient Instructions (Signed)
Fiber: **Psyllium husk or metamucil  Examples of fiber rich foods: Soluble Fiber Insoluble Fiber  Strawberries Strawberries  Blueberries Blueberries  Apples Apples  Beans Peas  Nuts and Seeds Walnuts and Almonds  Oatmeal Whole grains  Avocados Avocados  Brussels sprouts Coconut  Sweet potatoes Radish  Broccoli Popcorn  Turnips Turnips  Apricots Passionfruit  Nectarines Cauliflower  Carrots Prune

## 2022-04-19 NOTE — Therapy (Signed)
OUTPATIENT PHYSICAL THERAPY TREATMENT NOTE   Patient Name: Zachary [name] MRN: [id number] DOB:1960/11/22, 62 y.o., male Today's Date: 04/19/2022  PCP: Ronnald Nian, MD REFERRING PROVIDER: Meryl Dare, MD  END OF SESSION:   PT End of Session - 04/19/22 1616     Visit Number 2    Date for PT Re-Evaluation 08/15/22    Authorization Type [employer]    PT Start Time 1615    PT Stop Time 1655    PT Time Calculation (min) 40 min    Activity Tolerance Patient tolerated treatment well    Behavior During Therapy WFL for tasks assessed/performed             Past Medical History:  Diagnosis Date   Anxiety    Crohn disease    pt reports subsequent testing was normal   Depression    GERD (gastroesophageal reflux disease)    Herpes simplex    Hoarding disorder    Hyperlipidemia    Hypertension    IBS (irritable bowel syndrome)    Incontinence    Past Surgical History:  Procedure Laterality Date   APPENDECTOMY     COLONOSCOPY     POLYPECTOMY     TWISTED TESTICLES  1970   [facility]    UMBILICAL HERNIA REPAIR     Patient Active Problem List   Diagnosis Date Noted   Mixed obsessional thoughts and acts 09/04/2021   MDD (major depressive disorder), recurrent episode, severe 08/31/2021   History of drug overdose 08/30/2021   Tachycardia 06/15/2021   OSA (obstructive sleep apnea) 06/15/2021   Family history of prostate cancer 09/13/2020   Absence of bladder continence 08/30/2020   High risk medication use 08/30/2020   Hypothyroidism 08/30/2020   Incontinence of feces 11/03/2019   OAB (overactive bladder) 10/26/2019   Pancreatic insufficiency 10/26/2019   Spinal stenosis of lumbar region 03/03/2018   Essential hypertension 01/27/2018   OCD (obsessive compulsive disorder) 10/10/2017   Social anxiety disorder 10/10/2017   Seasonal allergies 06/26/2010   History of herpes labialis 06/26/2010   FLATULENCE ERUCTATION AND GAS PAIN 09/23/2007   Major depression  09/22/2007   CROHN'S DISEASE, LARGE AND SMALL INTESTINES 09/22/2007   COLONIC POLYPS, HYPERPLASTIC, HX OF 09/22/2007    REFERRING DIAG: R15.9 (ICD-10-CM) - Incontinence of feces, unspecified fecal incontinence type  THERAPY DIAG:  Abnormal posture  Muscle weakness (generalized)  Unspecified lack of coordination  Fecal smearing  Urge incontinence  Rationale for Evaluation and Treatment Rehabilitation  PERTINENT HISTORY: Crohn's disease; appendectomy, umbilical hernia repair  PRECAUTIONS: NA  SUBJECTIVE:  SUBJECTIVE STATEMENT:  Pt states that he is having bowel movements more often - not daily, but more than 2x/week. He is performing bowel massage regularly and using squatty potty. He states that he has not had any lower abdominal.    PAIN:  Are you having pain? No   02/28/22 SUBJECTIVE STATEMENT: For the last couple of years, patient reports full fecal incontinence. However, this is better, and now he is more concerned with small amount of fecal smearing. He was diagnosed with Crohn's disease in the 64s. He states that his diet is very poor. He has been on the imodium for 2 months now and feels like fecal smearing has improved.  Fluid intake: more than 8 glasses of water  PAIN:  Are you having pain? Yes NPRS scale: 7/10 at worst Pain location: abdominal pain, Bil lower quadrant  Pain type: cramping Pain description: intermittent   Aggravating factors: unsure Relieving factors: sometimes mint  PRECAUTIONS: None  WEIGHT BEARING RESTRICTIONS: No  FALLS:  Has patient fallen in last 6 months? No  LIVING ENVIRONMENT: Lives with: lives with their family and lives alone Lives in: House/apartment   OCCUPATION: Marketing executive   PLOF: Independent  PATIENT GOALS: avoid severe bowel  incontinence when he gets older  PERTINENT HISTORY:  Crohn's disease; appendectomy, umbilical hernia repair   BOWEL MOVEMENT: Pain with bowel movement: Yes Type of bowel movement:Type (Bristol Stool Scale) 7, Frequency 2x/week, and Strain Yes Fully empty rectum: Yes: unsure Leakage: Yes: fecal smearing Pads: No Fiber supplement: No  URINATION: Pain with urination: No Fully empty bladder: No, will leak after he is done urinating Stream: Strong Urgency: Yes: - Frequency: no idea Leakage: Urge to void Pads: No  INTERCOURSE: Pain with intercourse: none History of pain with morning erection   OBJECTIVE:   02/28/22:  COGNITION: Overall cognitive status: Within functional limits for tasks assessed     SENSATION: Light touch: Appears intact Proprioception: Appears intact    GAIT: Comments: Flexed forward posture, decreased bil hip extension  POSTURE: rounded shoulders, forward head, increased thoracic kyphosis, and posterior pelvic tilt  PELVIC ALIGNMENT:  LUMBARAROM/PROM:    LOWER EXTREMITY AROM/PROM:    LOWER EXTREMITY MMT:   PALPATION: GENERAL NA              External Perineal Exam external hemorrhoids               Internal Pelvic Floor Tenderness and sensation of bowel movement with palpation of puborectalis, Lt>Rt Patient confirms identification and approves PT to assess internal pelvic floor and treatment Yes  PELVIC MMT:   MMT eval  Internal Anal Sphincter 3/5, >10 sec endurance, 12 repeat contraction with moderate level of coordination   External Anal Sphincter 3/5  Puborectalis 3/5  Diastasis Recti Abdominal distortion noted with bed mobility  (Blank rows = not tested)  TONE: High  TODAY'S TREATMENT:  DATE:  04/19/22 Exercises: Lower trunk rotation 4 x 10 Supine piriformis stretch 60 sec bil Bridge with hip  adduction 2 x 10 Seated hip adduction ball squeeze 2 x 10 Seated hip abduction yellow loop 2 x 10 Seated hip internal rotation 2 x 10 Therapeutic activities: Dietary changes, more fruits and vegetables; consider including fiber supplement Reviewed balloon breathing   02/28/22  EVAL  Therapeutic activities: [employer], relaxed toilet mechanics Bowel massage Balloon Breathing    PATIENT EDUCATION:  Education details: see above Person educated: Patient Education method: Programmer, multimedia, Demonstration, Tactile cues, Verbal cues, and Handouts Education comprehension: verbalized understanding  HOME EXERCISE PROGRAM: [id number]  ASSESSMENT:  CLINICAL IMPRESSION: Pt overall doing very well with increase in number of bowel movements each week (he is unsure of how many), no abdominal pain, and improved emptying. However, he is still having difficulty getting clean after bowel movements, indicating that he is still not emptying completely. He reports that he is not eating any fruits or vegetables throughout the day; we discussed that he will likely not achieve full continence without improving bulk of bowel movements. Therefore he was encouraged to begin intake of fruits and vegetables and consider a fiber supplement, following the instructions on the bottle. He will benefit from skilled PT intervention in order to resolve fecal smearing and urinary incontinence.   OBJECTIVE IMPAIRMENTS: decreased activity tolerance, decreased coordination, decreased endurance, decreased mobility, decreased strength, increased fascial restrictions, increased muscle spasms, impaired tone, postural dysfunction, and pain.   ACTIVITY LIMITATIONS: continence  PARTICIPATION LIMITATIONS: community activity and occupation  PERSONAL FACTORS: Fitness and diet are also affecting patient's functional outcome.   REHAB POTENTIAL: Good  CLINICAL DECISION MAKING: Stable/uncomplicated  EVALUATION COMPLEXITY:  Low   GOALS: Goals reviewed with patient? Yes  SHORT TERM GOALS: Target date: 04/04/22  Pt will be independent with HEP.   Baseline: Goal status: INITIAL  2.  Pt will be independent with diaphragmatic breathing and down training activities in order to improve pelvic floor relaxation.  Baseline:  Goal status: INITIAL  3.  Pt will be independent with use of squatty potty, relaxed toileting mechanics, and improved bowel movement techniques in order to increase ease of bowel movements and complete evacuation.   Baseline:  Goal status: INITIAL    LONG TERM GOALS: Target date: 08/15/22  Pt will be independent with advanced HEP.   Baseline:  Goal status: INITIAL  2.  Pt will report no episodes of urinary or fecal incontinence in order to improve confidence in community activities and personal hygiene.   Baseline:  Goal status: INITIAL  3.  Pt will report >4 bowel movements a week in order to reduce back up of stool that is causing fecal smearing.  Baseline:  Goal status: INITIAL  4.  Pt will demonstrate improved pelvic floor muscle tone in order to be able to completely void/evacuate without straining.  Baseline:  Goal status: INITIAL  5.  Pt will demonstrate normal pelvic floor muscle tone and A/ROM, able to achieve 4/5 strength with contractions and 10 sec endurance, in order to provide appropriate lumbopelvic support in functional activities.   Baseline:  Goal status: INITIAL  6.  Pt will report no greater than 3/10 bil abdominal pain.  Baseline:  Goal status: INITIAL   PLAN:  PT FREQUENCY: 1-2x/week  PT DURATION: 6 months  PLANNED INTERVENTIONS: Therapeutic exercises, Therapeutic activity, Neuromuscular re-education, Balance training, Gait training, Patient/Family education, Self Care, Joint mobilization, Dry Needling, Biofeedback, and Manual therapy  PLAN FOR  NEXT SESSION: Perform pelvic floor release if there are still no improvements; progress  mobility/strengthening activities; bowel massage.  Julio Alm, PT, DPT04/18/245:01 PM

## 2022-04-24 ENCOUNTER — Telehealth: Payer: Self-pay | Admitting: Family Medicine

## 2022-04-24 MED ORDER — EMTRICITABINE-TENOFOVIR DF 200-300 MG PO TABS: 1.0000 | ORAL_TABLET | Freq: Every day | ORAL | 3 refills | Status: DC

## 2022-04-24 NOTE — Telephone Encounter (Signed)
Pharmcy sent refill request for TRUVADA PLEASE SEND TO A NEW PHARMACY  [facility] [phone] [place], [place] - [address]

## 2022-04-25 ENCOUNTER — Telehealth: Payer: Self-pay | Admitting: Family Medicine

## 2022-04-25 MED ORDER — EMTRICITABINE-TENOFOVIR DF 200-300 MG PO TABS: 1.0000 | ORAL_TABLET | Freq: Every day | ORAL | 3 refills | Status: DC

## 2022-04-25 NOTE — Telephone Encounter (Signed)
Fax from [facility]  Pt requests new rx, pt changed pharmacies  Emtricitabine/Tenofovir 200/300 mg tab

## 2022-04-26 ENCOUNTER — Ambulatory Visit: Payer: 59

## 2022-04-26 DIAGNOSIS — R279 Unspecified lack of coordination: Secondary | ICD-10-CM

## 2022-04-26 DIAGNOSIS — R293 Abnormal posture: Secondary | ICD-10-CM | POA: Diagnosis not present

## 2022-04-26 DIAGNOSIS — N3941 Urge incontinence: Secondary | ICD-10-CM

## 2022-04-26 DIAGNOSIS — M6281 Muscle weakness (generalized): Secondary | ICD-10-CM

## 2022-04-26 DIAGNOSIS — R151 Fecal smearing: Secondary | ICD-10-CM

## 2022-04-26 NOTE — Therapy (Signed)
OUTPATIENT PHYSICAL THERAPY TREATMENT NOTE   Patient Name: Zachary [name] MRN: [id number] DOB:1960-03-09, 62 y.o., male Today's Date: 04/26/2022  PCP: Ronnald Nian, MD REFERRING PROVIDER: Meryl Dare, MD  END OF SESSION:   PT End of Session - 04/26/22 1615     Visit Number 3    Date for PT Re-Evaluation 08/15/22    Authorization Type [employer]    PT Start Time 1615    PT Stop Time 1653    PT Time Calculation (min) 38 min    Activity Tolerance Patient tolerated treatment well    Behavior During Therapy WFL for tasks assessed/performed              Past Medical History:  Diagnosis Date   Anxiety    Crohn disease    pt reports subsequent testing was normal   Depression    GERD (gastroesophageal reflux disease)    Herpes simplex    Hoarding disorder    Hyperlipidemia    Hypertension    IBS (irritable bowel syndrome)    Incontinence    Past Surgical History:  Procedure Laterality Date   APPENDECTOMY     COLONOSCOPY     POLYPECTOMY     TWISTED TESTICLES  1970   [facility]    UMBILICAL HERNIA REPAIR     Patient Active Problem List   Diagnosis Date Noted   Mixed obsessional thoughts and acts 09/04/2021   MDD (major depressive disorder), recurrent episode, severe 08/31/2021   History of drug overdose 08/30/2021   Tachycardia 06/15/2021   OSA (obstructive sleep apnea) 06/15/2021   Family history of prostate cancer 09/13/2020   Absence of bladder continence 08/30/2020   High risk medication use 08/30/2020   Hypothyroidism 08/30/2020   Incontinence of feces 11/03/2019   OAB (overactive bladder) 10/26/2019   Pancreatic insufficiency 10/26/2019   Spinal stenosis of lumbar region 03/03/2018   Essential hypertension 01/27/2018   OCD (obsessive compulsive disorder) 10/10/2017   Social anxiety disorder 10/10/2017   Seasonal allergies 06/26/2010   History of herpes labialis 06/26/2010   FLATULENCE ERUCTATION AND GAS PAIN 09/23/2007   Major depression  09/22/2007   CROHN'S DISEASE, LARGE AND SMALL INTESTINES 09/22/2007   COLONIC POLYPS, HYPERPLASTIC, HX OF 09/22/2007    REFERRING DIAG: R15.9 (ICD-10-CM) - Incontinence of feces, unspecified fecal incontinence type  THERAPY DIAG:  Abnormal posture  Muscle weakness (generalized)  Unspecified lack of coordination  Fecal smearing  Urge incontinence  Rationale for Evaluation and Treatment Rehabilitation  PERTINENT HISTORY: Crohn's disease; appendectomy, umbilical hernia repair  PRECAUTIONS: NA  SUBJECTIVE:  SUBJECTIVE STATEMENT:  Pt is having more regular bowel movements. He has started having a pint of blueberries each night. He has been getting salad when it comes with the meal each night. He has had leaking twice in the last week, and one time was just fluid which is abnormal.    PAIN:  Are you having pain? No   02/28/22 SUBJECTIVE STATEMENT: For the last couple of years, patient reports full fecal incontinence. However, this is better, and now he is more concerned with small amount of fecal smearing. He was diagnosed with Crohn's disease in the 50s. He states that his diet is very poor. He has been on the imodium for 2 months now and feels like fecal smearing has improved.  Fluid intake: more than 8 glasses of water  PAIN:  Are you having pain? Yes NPRS scale: 7/10 at worst Pain location: abdominal pain, Bil lower quadrant  Pain type: cramping Pain description: intermittent   Aggravating factors: unsure Relieving factors: sometimes mint  PRECAUTIONS: None  WEIGHT BEARING RESTRICTIONS: No  FALLS:  Has patient fallen in last 6 months? No  LIVING ENVIRONMENT: Lives with: lives with their family and lives alone Lives in: House/apartment   OCCUPATION: Marketing executive   PLOF:  Independent  PATIENT GOALS: avoid severe bowel incontinence when he gets older  PERTINENT HISTORY:  Crohn's disease; appendectomy, umbilical hernia repair   BOWEL MOVEMENT: Pain with bowel movement: Yes Type of bowel movement:Type (Bristol Stool Scale) 7, Frequency 2x/week, and Strain Yes Fully empty rectum: Yes: unsure Leakage: Yes: fecal smearing Pads: No Fiber supplement: No  URINATION: Pain with urination: No Fully empty bladder: No, will leak after he is done urinating Stream: Strong Urgency: Yes: - Frequency: no idea Leakage: Urge to void Pads: No  INTERCOURSE: Pain with intercourse: none History of pain with morning erection   OBJECTIVE:   02/28/22:  COGNITION: Overall cognitive status: Within functional limits for tasks assessed     SENSATION: Light touch: Appears intact Proprioception: Appears intact    GAIT: Comments: Flexed forward posture, decreased bil hip extension  POSTURE: rounded shoulders, forward head, increased thoracic kyphosis, and posterior pelvic tilt  PELVIC ALIGNMENT:  LUMBARAROM/PROM:    LOWER EXTREMITY AROM/PROM:    LOWER EXTREMITY MMT:   PALPATION: GENERAL NA              External Perineal Exam external hemorrhoids               Internal Pelvic Floor Tenderness and sensation of bowel movement with palpation of puborectalis, Lt>Rt Patient confirms identification and approves PT to assess internal pelvic floor and treatment Yes  PELVIC MMT:   MMT eval  Internal Anal Sphincter 3/5, >10 sec endurance, 12 repeat contraction with moderate level of coordination   External Anal Sphincter 3/5  Puborectalis 3/5  Diastasis Recti Abdominal distortion noted with bed mobility  (Blank rows = not tested)  TONE: High  TODAY'S TREATMENT:  DATE:  04/26/22 Manual: Abdominal soft tissue  mobilization Bowel mobilization Exercises: Bridge with hip adduction 2 x 10 Seated hip adduction ball squeeze 2 x 10 Seated hip abduction yellow loop 2 x 10 Seated hip internal rotation 2 x 10 Side lying clam shell 2 x 10 Reverse clam shell 2 x 10   04/19/22 Exercises: Lower trunk rotation 4 x 10 Supine piriformis stretch 60 sec bil Bridge with hip adduction 2 x 10 Seated hip adduction ball squeeze 2 x 10 Seated hip abduction yellow loop 2 x 10 Seated hip internal rotation 2 x 10 Therapeutic activities: Dietary changes, more fruits and vegetables; consider including fiber supplement Reviewed balloon breathing   02/28/22  EVAL  Therapeutic activities: [employer], relaxed toilet mechanics Bowel massage Balloon Breathing    PATIENT EDUCATION:  Education details: see above Person educated: Patient Education method: Programmer, multimedia, Demonstration, Tactile cues, Verbal cues, and Handouts Education comprehension: verbalized understanding  HOME EXERCISE PROGRAM: [id number]  ASSESSMENT:  CLINICAL IMPRESSION: Pt has done well with adding in more fiber to his diet with daily intake of blueberries. He is working on Programme researcher, broadcasting/film/video supplement. He has seen some improvement in increased frequency of bowel movements. He tolerated bowel mobilization/abdominal muscle mobilization well with mild discomfort in Lt lower quadrant. He has not been performing HEP regularly; therefore we reviewed exercises with addition of clam shells and reverse clam shells. He will benefit from skilled PT intervention in order to resolve fecal smearing and urinary incontinence.   OBJECTIVE IMPAIRMENTS: decreased activity tolerance, decreased coordination, decreased endurance, decreased mobility, decreased strength, increased fascial restrictions, increased muscle spasms, impaired tone, postural dysfunction, and pain.   ACTIVITY LIMITATIONS: continence  PARTICIPATION LIMITATIONS: community activity and  occupation  PERSONAL FACTORS: Fitness and diet are also affecting patient's functional outcome.   REHAB POTENTIAL: Good  CLINICAL DECISION MAKING: Stable/uncomplicated  EVALUATION COMPLEXITY: Low   GOALS: Goals reviewed with patient? Yes  SHORT TERM GOALS: Target date: 04/04/22 - updated 04/26/22  Pt will be independent with HEP.   Baseline: Goal status:  IN PROGRESS  2.  Pt will be independent with diaphragmatic breathing and down training activities in order to improve pelvic floor relaxation.  Baseline:  Goal status:  IN PROGRESS  3.  Pt will be independent with use of squatty potty, relaxed toileting mechanics, and improved bowel movement techniques in order to increase ease of bowel movements and complete evacuation.   Baseline:  Goal status:  IN PROGRESS    LONG TERM GOALS: Target date: 08/15/22 - updated 04/26/22  Pt will be independent with advanced HEP.   Baseline:  Goal status: IN PROGRESS  2.  Pt will report no episodes of urinary or fecal incontinence in order to improve confidence in community activities and personal hygiene.   Baseline:  Goal status:  IN PROGRESS  3.  Pt will report >4 bowel movements a week in order to reduce back up of stool that is causing fecal smearing.  Baseline:  Goal status:  IN PROGRESS  4.  Pt will demonstrate improved pelvic floor muscle tone in order to be able to completely void/evacuate without straining.  Baseline:  Goal status:  IN PROGRESS  5.  Pt will demonstrate normal pelvic floor muscle tone and A/ROM, able to achieve 4/5 strength with contractions and 10 sec endurance, in order to provide appropriate lumbopelvic support in functional activities.   Baseline:  Goal status:  IN PROGRESS  6.  Pt will report no greater than 3/10 bil abdominal  pain.  Baseline:  Goal status:  IN PROGRESS   PLAN:  PT FREQUENCY: 1-2x/week  PT DURATION: 6 months  PLANNED INTERVENTIONS: Therapeutic exercises, Therapeutic  activity, Neuromuscular re-education, Balance training, Gait training, Patient/Family education, Self Care, Joint mobilization, Dry Needling, Biofeedback, and Manual therapy  PLAN FOR NEXT SESSION: If pt is not beginning to see progress next session, believe he may require internal rectal exam; continue to progress strengthening and begin core facilitation.   Julio Alm, PT, DPT04/25/244:15 PM

## 2022-05-01 ENCOUNTER — Other Ambulatory Visit: Payer: Self-pay | Admitting: Family Medicine

## 2022-05-03 ENCOUNTER — Ambulatory Visit: Payer: 59 | Attending: Gastroenterology

## 2022-05-03 DIAGNOSIS — R293 Abnormal posture: Secondary | ICD-10-CM | POA: Diagnosis present

## 2022-05-03 DIAGNOSIS — R151 Fecal smearing: Secondary | ICD-10-CM

## 2022-05-03 DIAGNOSIS — R279 Unspecified lack of coordination: Secondary | ICD-10-CM

## 2022-05-03 DIAGNOSIS — M6281 Muscle weakness (generalized): Secondary | ICD-10-CM | POA: Diagnosis present

## 2022-05-03 DIAGNOSIS — N3941 Urge incontinence: Secondary | ICD-10-CM | POA: Diagnosis present

## 2022-05-03 NOTE — Therapy (Signed)
OUTPATIENT PHYSICAL THERAPY TREATMENT NOTE   Patient Name: Zachary [name] MRN: [id number] DOB:[birth year], 62 y.o., male Today's Date: 05/03/2022  PCP: Ronnald Nian, MD REFERRING PROVIDER: Meryl Dare, MD  END OF SESSION:   PT End of Session - 05/03/22 1616     Visit Number 4    Date for PT Re-Evaluation 08/15/22    Authorization Type [employer]    PT Start Time 1615    PT Stop Time 1653    PT Time Calculation (min) 38 min    Activity Tolerance Patient tolerated treatment well    Behavior During Therapy WFL for tasks assessed/performed               Past Medical History:  Diagnosis Date   Anxiety    Crohn disease (HCC)    pt reports subsequent testing was normal   Depression    GERD (gastroesophageal reflux disease)    Herpes simplex    Hoarding disorder    Hyperlipidemia    Hypertension    IBS (irritable bowel syndrome)    Incontinence    Past Surgical History:  Procedure Laterality Date   APPENDECTOMY     COLONOSCOPY     POLYPECTOMY     TWISTED TESTICLES  1970   [facility]    UMBILICAL HERNIA REPAIR     Patient Active Problem List   Diagnosis Date Noted   Mixed obsessional thoughts and acts 09/04/2021   MDD (major depressive disorder), recurrent episode, severe (HCC) 08/31/2021   History of drug overdose 08/30/2021   Tachycardia 06/15/2021   OSA (obstructive sleep apnea) 06/15/2021   Family history of prostate cancer 09/13/2020   Absence of bladder continence 08/30/2020   High risk medication use 08/30/2020   Hypothyroidism 08/30/2020   Incontinence of feces 11/03/2019   OAB (overactive bladder) 10/26/2019   Pancreatic insufficiency 10/26/2019   Spinal stenosis of lumbar region 03/03/2018   Essential hypertension 01/27/2018   OCD (obsessive compulsive disorder) 10/10/2017   Social anxiety disorder 10/10/2017   Seasonal allergies 06/26/2010   History of herpes labialis 06/26/2010   FLATULENCE ERUCTATION AND GAS PAIN 09/23/2007   Major  depression 09/22/2007   CROHN'S DISEASE, LARGE AND SMALL INTESTINES 09/22/2007   COLONIC POLYPS, HYPERPLASTIC, HX OF 09/22/2007    REFERRING DIAG: R15.9 (ICD-10-CM) - Incontinence of feces, unspecified fecal incontinence type  THERAPY DIAG:  Abnormal posture  Muscle weakness (generalized)  Unspecified lack of coordination  Fecal smearing  Urge incontinence  Rationale for Evaluation and Treatment Rehabilitation  PERTINENT HISTORY: Crohn's disease; appendectomy, umbilical hernia repair  PRECAUTIONS: NA  SUBJECTIVE:  SUBJECTIVE STATEMENT:  Pt states that he is not sleeping well and is not feeling well due to this. He has started taking fiber and is doing 1 capsule 1x/day. He is noticing improved frequency of bowel movements, but can't remember if they are daily. He feels like leaking continues to improve, and he is now finding just residue around anus instead of liquid in his underwear.    PAIN:  Are you having pain? No   02/28/22 SUBJECTIVE STATEMENT: For the last couple of years, patient reports full fecal incontinence. However, this is better, and now he is more concerned with small amount of fecal smearing. He was diagnosed with Crohn's disease in the 15s. He states that his diet is very poor. He has been on the imodium for 2 months now and feels like fecal smearing has improved.  Fluid intake: more than 8 glasses of water  PAIN:  Are you having pain? Yes NPRS scale: 7/10 at worst Pain location: abdominal pain, Bil lower quadrant  Pain type: cramping Pain description: intermittent   Aggravating factors: unsure Relieving factors: sometimes mint  PRECAUTIONS: None  WEIGHT BEARING RESTRICTIONS: No  FALLS:  Has patient fallen in last 6 months? No  LIVING ENVIRONMENT: Lives with: lives  with their family and lives alone Lives in: House/apartment   OCCUPATION: Marketing executive   PLOF: Independent  PATIENT GOALS: avoid severe bowel incontinence when he gets older  PERTINENT HISTORY:  Crohn's disease; appendectomy, umbilical hernia repair   BOWEL MOVEMENT: Pain with bowel movement: Yes Type of bowel movement:Type (Bristol Stool Scale) 7, Frequency 2x/week, and Strain Yes Fully empty rectum: Yes: unsure Leakage: Yes: fecal smearing Pads: No Fiber supplement: No  URINATION: Pain with urination: No Fully empty bladder: No, will leak after he is done urinating Stream: Strong Urgency: Yes: - Frequency: no idea Leakage: Urge to void Pads: No  INTERCOURSE: Pain with intercourse: none History of pain with morning erection   OBJECTIVE:   02/28/22:  COGNITION: Overall cognitive status: Within functional limits for tasks assessed     SENSATION: Light touch: Appears intact Proprioception: Appears intact    GAIT: Comments: Flexed forward posture, decreased bil hip extension  POSTURE: rounded shoulders, forward head, increased thoracic kyphosis, and posterior pelvic tilt  PELVIC ALIGNMENT:  LUMBARAROM/PROM:    LOWER EXTREMITY AROM/PROM:    LOWER EXTREMITY MMT:   PALPATION: GENERAL NA              External Perineal Exam external hemorrhoids               Internal Pelvic Floor Tenderness and sensation of bowel movement with palpation of puborectalis, Lt>Rt Patient confirms identification and approves PT to assess internal pelvic floor and treatment Yes  PELVIC MMT:   MMT eval  Internal Anal Sphincter 3/5, >10 sec endurance, 12 repeat contraction with moderate level of coordination   External Anal Sphincter 3/5  Puborectalis 3/5  Diastasis Recti Abdominal distortion noted with bed mobility  (Blank rows = not tested)  TONE: High  TODAY'S TREATMENT:  DATE:  05/03/22 Neuromuscular re-education: Transversus abdominus training with multimodal cues for improved motor control and breath coordination Bil UE ball press in supine 2 x 10 Hip adduction with core activation 2 x 10  Side lying UE ball press with core activation 10x bil Pelvic floor contractions Quick flicks 10x Long holds 5 x 10 seconds Therapeutic activities: Increasing fiber per instructions on bottle Consistency of bowel movements and leaking    04/26/22 Manual: Abdominal soft tissue mobilization Bowel mobilization Exercises: Bridge with hip adduction 2 x 10 Seated hip adduction ball squeeze 2 x 10 Seated hip abduction yellow loop 2 x 10 Seated hip internal rotation 2 x 10 Side lying clam shell 2 x 10 Reverse clam shell 2 x 10   04/19/22 Exercises: Lower trunk rotation 4 x 10 Supine piriformis stretch 60 sec bil Bridge with hip adduction 2 x 10 Seated hip adduction ball squeeze 2 x 10 Seated hip abduction yellow loop 2 x 10 Seated hip internal rotation 2 x 10 Therapeutic activities: Dietary changes, more fruits and vegetables; consider including fiber supplement Reviewed balloon breathing   PATIENT EDUCATION:  Education details: see above Person educated: Patient Education method: Explanation, Demonstration, Tactile cues, Verbal cues, and Handouts Education comprehension: verbalized understanding  HOME EXERCISE PROGRAM: [id number]  ASSESSMENT:  CLINICAL IMPRESSION: Pt seeing some good progress. Believe that fiber initially got things moving and helped him empty backed up stool, which is why he had multiple bowel movements the same day. Believe he may have continued to have regular bowel movements with less frequency if he continued to take the same dose of fiber instead of decreasing; therefore he was encouraged to return to this dose as long as it is within the recommended dose on the bottle. We discussed how  consistency of bowel movement and constipation and can leaking; it is encouraging that leaking has gone from liquid to residue in the last week with addition of fiber. He did well with starting pelvic floor strengthening program and core training. HEP updated. He will benefit from skilled PT intervention in order to resolve fecal smearing and urinary incontinence.   OBJECTIVE IMPAIRMENTS: decreased activity tolerance, decreased coordination, decreased endurance, decreased mobility, decreased strength, increased fascial restrictions, increased muscle spasms, impaired tone, postural dysfunction, and pain.   ACTIVITY LIMITATIONS: continence  PARTICIPATION LIMITATIONS: community activity and occupation  PERSONAL FACTORS: Fitness and diet are also affecting patient's functional outcome.   REHAB POTENTIAL: Good  CLINICAL DECISION MAKING: Stable/uncomplicated  EVALUATION COMPLEXITY: Low   GOALS: Goals reviewed with patient? Yes  SHORT TERM GOALS: Target date: 04/04/22 - updated 04/26/22  Pt will be independent with HEP.   Baseline: Goal status:  IN PROGRESS  2.  Pt will be independent with diaphragmatic breathing and down training activities in order to improve pelvic floor relaxation.  Baseline:  Goal status:  IN PROGRESS  3.  Pt will be independent with use of squatty potty, relaxed toileting mechanics, and improved bowel movement techniques in order to increase ease of bowel movements and complete evacuation.   Baseline:  Goal status:  IN PROGRESS    LONG TERM GOALS: Target date: 08/15/22 - updated 04/26/22  Pt will be independent with advanced HEP.   Baseline:  Goal status: IN PROGRESS  2.  Pt will report no episodes of urinary or fecal incontinence in order to improve confidence in community activities and personal hygiene.   Baseline:  Goal status:  IN PROGRESS  3.  Pt will report >4 bowel movements  a week in order to reduce back up of stool that is causing fecal  smearing.  Baseline:  Goal status:  IN PROGRESS  4.  Pt will demonstrate improved pelvic floor muscle tone in order to be able to completely void/evacuate without straining.  Baseline:  Goal status:  IN PROGRESS  5.  Pt will demonstrate normal pelvic floor muscle tone and A/ROM, able to achieve 4/5 strength with contractions and 10 sec endurance, in order to provide appropriate lumbopelvic support in functional activities.   Baseline:  Goal status:  IN PROGRESS  6.  Pt will report no greater than 3/10 bil abdominal pain.  Baseline:  Goal status:  IN PROGRESS   PLAN:  PT FREQUENCY: 1-2x/week  PT DURATION: 6 months  PLANNED INTERVENTIONS: Therapeutic exercises, Therapeutic activity, Neuromuscular re-education, Balance training, Gait training, Patient/Family education, Self Care, Joint mobilization, Dry Needling, Biofeedback, and Manual therapy  PLAN FOR NEXT SESSION:  Progress core training activities and pelvic floor contractions to standing positions.   Julio Alm, PT, DPT05/02/245:03 PM

## 2022-05-06 ENCOUNTER — Other Ambulatory Visit: Payer: Self-pay | Admitting: Psychiatry

## 2022-05-06 DIAGNOSIS — E871 Hypo-osmolality and hyponatremia: Secondary | ICD-10-CM

## 2022-05-10 ENCOUNTER — Ambulatory Visit: Payer: 59

## 2022-05-10 DIAGNOSIS — R279 Unspecified lack of coordination: Secondary | ICD-10-CM

## 2022-05-10 DIAGNOSIS — R293 Abnormal posture: Secondary | ICD-10-CM

## 2022-05-10 DIAGNOSIS — N3941 Urge incontinence: Secondary | ICD-10-CM

## 2022-05-10 DIAGNOSIS — M6281 Muscle weakness (generalized): Secondary | ICD-10-CM

## 2022-05-10 DIAGNOSIS — R151 Fecal smearing: Secondary | ICD-10-CM

## 2022-05-10 NOTE — Therapy (Addendum)
OUTPATIENT PHYSICAL THERAPY TREATMENT NOTE   Patient Name: Zachary [name] MRN: [id number] DOB:1960-08-04, 62 y.o., male Today's Date: 05/10/2022  PCP: Ronnald Nian, MD REFERRING PROVIDER: Meryl Dare, MD  END OF SESSION:   PT End of Session - 05/10/22 1618     Visit Number 5    Date for PT Re-Evaluation 08/15/22    Authorization Type [employer]    PT Start Time 1618    PT Stop Time 1655    PT Time Calculation (min) 37 min    Activity Tolerance Patient tolerated treatment well    Behavior During Therapy WFL for tasks assessed/performed               Past Medical History:  Diagnosis Date   Anxiety    Crohn disease (HCC)    pt reports subsequent testing was normal   Depression    GERD (gastroesophageal reflux disease)    Herpes simplex    Hoarding disorder    Hyperlipidemia    Hypertension    IBS (irritable bowel syndrome)    Incontinence    Past Surgical History:  Procedure Laterality Date   APPENDECTOMY     COLONOSCOPY     POLYPECTOMY     TWISTED TESTICLES  1970   [facility]    UMBILICAL HERNIA REPAIR     Patient Active Problem List   Diagnosis Date Noted   Mixed obsessional thoughts and acts 09/04/2021   MDD (major depressive disorder), recurrent episode, severe (HCC) 08/31/2021   History of drug overdose 08/30/2021   Tachycardia 06/15/2021   OSA (obstructive sleep apnea) 06/15/2021   Family history of prostate cancer 09/13/2020   Absence of bladder continence 08/30/2020   High risk medication use 08/30/2020   Hypothyroidism 08/30/2020   Incontinence of feces 11/03/2019   OAB (overactive bladder) 10/26/2019   Pancreatic insufficiency 10/26/2019   Spinal stenosis of lumbar region 03/03/2018   Essential hypertension 01/27/2018   OCD (obsessive compulsive disorder) 10/10/2017   Social anxiety disorder 10/10/2017   Seasonal allergies 06/26/2010   History of herpes labialis 06/26/2010   FLATULENCE ERUCTATION AND GAS PAIN 09/23/2007   Major  depression 09/22/2007   CROHN'S DISEASE, LARGE AND SMALL INTESTINES 09/22/2007   COLONIC POLYPS, HYPERPLASTIC, HX OF 09/22/2007    REFERRING DIAG: R15.9 (ICD-10-CM) - Incontinence of feces, unspecified fecal incontinence type  THERAPY DIAG:  Abnormal posture  Unspecified lack of coordination  Fecal smearing  Urge incontinence  Muscle weakness (generalized)  Rationale for Evaluation and Treatment Rehabilitation  PERTINENT HISTORY: Crohn's disease; appendectomy, umbilical hernia repair  PRECAUTIONS: NA  SUBJECTIVE:  SUBJECTIVE STATEMENT:  Pt states that he has not been doing as many exercises as he would like, but he's figured out that he does them in bed. Patient has had one bowel movement a day over the last week and no episodes of fecal smearing.    PAIN:  Are you having pain? No   02/28/22 SUBJECTIVE STATEMENT: For the last couple of years, patient reports full fecal incontinence. However, this is better, and now he is more concerned with small amount of fecal smearing. He was diagnosed with Crohn's disease in the 60s. He states that his diet is very poor. He has been on the imodium for 2 months now and feels like fecal smearing has improved.  Fluid intake: more than 8 glasses of water  PAIN:  Are you having pain? Yes NPRS scale: 7/10 at worst Pain location: abdominal pain, Bil lower quadrant  Pain type: cramping Pain description: intermittent   Aggravating factors: unsure Relieving factors: sometimes mint  PRECAUTIONS: None  WEIGHT BEARING RESTRICTIONS: No  FALLS:  Has patient fallen in last 6 months? No  LIVING ENVIRONMENT: Lives with: lives with their family and lives alone Lives in: House/apartment   OCCUPATION: Marketing executive   PLOF: Independent  PATIENT GOALS: avoid  severe bowel incontinence when he gets older  PERTINENT HISTORY:  Crohn's disease; appendectomy, umbilical hernia repair   BOWEL MOVEMENT: Pain with bowel movement: Yes Type of bowel movement:Type (Bristol Stool Scale) 7, Frequency 2x/week, and Strain Yes Fully empty rectum: Yes: unsure Leakage: Yes: fecal smearing Pads: No Fiber supplement: No  URINATION: Pain with urination: No Fully empty bladder: No, will leak after he is done urinating Stream: Strong Urgency: Yes: - Frequency: no idea Leakage: Urge to void Pads: No  INTERCOURSE: Pain with intercourse: none History of pain with morning erection   OBJECTIVE:   02/28/22:  COGNITION: Overall cognitive status: Within functional limits for tasks assessed     SENSATION: Light touch: Appears intact Proprioception: Appears intact    GAIT: Comments: Flexed forward posture, decreased bil hip extension  POSTURE: rounded shoulders, forward head, increased thoracic kyphosis, and posterior pelvic tilt  PELVIC ALIGNMENT:  LUMBARAROM/PROM:    LOWER EXTREMITY AROM/PROM:    LOWER EXTREMITY MMT:   PALPATION: GENERAL NA              External Perineal Exam external hemorrhoids               Internal Pelvic Floor Tenderness and sensation of bowel movement with palpation of puborectalis, Lt>Rt Patient confirms identification and approves PT to assess internal pelvic floor and treatment Yes  PELVIC MMT:   MMT eval  Internal Anal Sphincter 3/5, >10 sec endurance, 12 repeat contraction with moderate level of coordination   External Anal Sphincter 3/5  Puborectalis 3/5  Diastasis Recti Abdominal distortion noted with bed mobility  (Blank rows = not tested)  TONE: High  TODAY'S TREATMENT:  DATE:  05/10/22  Exercises: 3-way kick 10x each bil Sidestepping 5 laps blue loop Marching 3 x  10 Heel raises 3 x 10 Bridge with hip adduction 2 x 10    05/03/22 Neuromuscular re-education: Transversus abdominus training with multimodal cues for improved motor control and breath coordination Bil UE ball press in supine 2 x 10 Hip adduction with core activation 2 x 10  Side lying UE ball press with core activation 10x bil Pelvic floor contractions Quick flicks 10x Long holds 5 x 10 seconds Therapeutic activities: Increasing fiber per instructions on bottle Consistency of bowel movements and leaking    04/26/22 Manual: Abdominal soft tissue mobilization Bowel mobilization Exercises: Bridge with hip adduction 2 x 10 Seated hip adduction ball squeeze 2 x 10 Seated hip abduction yellow loop 2 x 10 Seated hip internal rotation 2 x 10 Side lying clam shell 2 x 10 Reverse clam shell 2 x 10   PATIENT EDUCATION:  Education details: see above Person educated: Patient Education method: Explanation, Demonstration, Tactile cues, Verbal cues, and Handouts Education comprehension: verbalized understanding  HOME EXERCISE PROGRAM: [id number]  ASSESSMENT:  CLINICAL IMPRESSION: Pt doing extremely well with bowel movements, having at least 1 a day without straining and no episodes of fecal smearing. Due to excellent progress, we discussed possible discharge next session. He tolerated progressions to standing exercises moderately well, but does demonstrate decrease balance and quickly fatigues. He will benefit from skilled PT intervention in order to resolve fecal smearing and urinary incontinence.   OBJECTIVE IMPAIRMENTS: decreased activity tolerance, decreased coordination, decreased endurance, decreased mobility, decreased strength, increased fascial restrictions, increased muscle spasms, impaired tone, postural dysfunction, and pain.   ACTIVITY LIMITATIONS: continence  PARTICIPATION LIMITATIONS: community activity and occupation  PERSONAL FACTORS: Fitness and diet are also  affecting patient's functional outcome.   REHAB POTENTIAL: Good  CLINICAL DECISION MAKING: Stable/uncomplicated  EVALUATION COMPLEXITY: Low   GOALS: Goals reviewed with patient? Yes  SHORT TERM GOALS: Target date: 04/04/22 - updated 04/26/22  Pt will be independent with HEP.   Baseline: Goal status:  IN PROGRESS  2.  Pt will be independent with diaphragmatic breathing and down training activities in order to improve pelvic floor relaxation.  Baseline:  Goal status:  IN PROGRESS  3.  Pt will be independent with use of squatty potty, relaxed toileting mechanics, and improved bowel movement techniques in order to increase ease of bowel movements and complete evacuation.   Baseline:  Goal status:  IN PROGRESS    LONG TERM GOALS: Target date: 08/15/22 - updated 04/26/22  Pt will be independent with advanced HEP.   Baseline:  Goal status: IN PROGRESS  2.  Pt will report no episodes of urinary or fecal incontinence in order to improve confidence in community activities and personal hygiene.   Baseline:  Goal status:  IN PROGRESS  3.  Pt will report >4 bowel movements a week in order to reduce back up of stool that is causing fecal smearing.  Baseline:  Goal status:  IN PROGRESS  4.  Pt will demonstrate improved pelvic floor muscle tone in order to be able to completely void/evacuate without straining.  Baseline:  Goal status:  IN PROGRESS  5.  Pt will demonstrate normal pelvic floor muscle tone and A/ROM, able to achieve 4/5 strength with contractions and 10 sec endurance, in order to provide appropriate lumbopelvic support in functional activities.   Baseline:  Goal status:  IN PROGRESS  6.  Pt will report no greater  than 3/10 bil abdominal pain.  Baseline:  Goal status:  IN PROGRESS   PLAN:  PT FREQUENCY: 1-2x/week  PT DURATION: 6 months  PLANNED INTERVENTIONS: Therapeutic exercises, Therapeutic activity, Neuromuscular re-education, Balance training, Gait  training, Patient/Family education, Self Care, Joint mobilization, Dry Needling, Biofeedback, and Manual therapy  PLAN FOR NEXT SESSION:  Possible D/C  Julio Alm, PT, DPT05/09/245:17 PM  PHYSICAL THERAPY DISCHARGE SUMMARY  Visits from Start of Care: 5  Current functional level related to goals / functional outcomes: Independent   Remaining deficits: See above   Education / Equipment: HEP   Patient agrees to discharge. Patient goals were partially met. Patient is being discharged due to not returning since the last visit.  Julio Alm, PT, DPT06/05/245:12 PM

## 2022-05-16 ENCOUNTER — Telehealth: Payer: Self-pay | Admitting: Psychiatry

## 2022-05-16 ENCOUNTER — Encounter: Payer: Self-pay | Admitting: Family Medicine

## 2022-05-16 ENCOUNTER — Ambulatory Visit: Payer: 59 | Admitting: Family Medicine

## 2022-05-16 VITALS — BP 132/90 | HR 80 | Temp 96.7°F | Ht 75.0 in | Wt 230.4 lb

## 2022-05-16 DIAGNOSIS — Z7251 High risk heterosexual behavior: Secondary | ICD-10-CM

## 2022-05-16 DIAGNOSIS — R110 Nausea: Secondary | ICD-10-CM

## 2022-05-16 DIAGNOSIS — E039 Hypothyroidism, unspecified: Secondary | ICD-10-CM

## 2022-05-16 DIAGNOSIS — R61 Generalized hyperhidrosis: Secondary | ICD-10-CM

## 2022-05-16 DIAGNOSIS — R42 Dizziness and giddiness: Secondary | ICD-10-CM

## 2022-05-16 DIAGNOSIS — R10813 Right lower quadrant abdominal tenderness: Secondary | ICD-10-CM | POA: Diagnosis not present

## 2022-05-16 NOTE — Telephone Encounter (Signed)
Patient reporting night sweats for several months.  He saw PCP today and was told that it could be some of his meds. I don't show that there have been any recent med changes. He can't pinpoint to a specific time period that might help narrow down it down. He said last night it was so bad he was worried about becoming dehydrated. I asked if he was still using the CPAP and he said PCP took him off of it.

## 2022-05-16 NOTE — Patient Instructions (Addendum)
We are testing your thyroid levels and for HIV. Be sure to stay well hydrated. If you have a lot of runny nose, you may use zyrtec to treat allergies (since claritin wasn't effective).  A nasal steroid spray such as flonase might also help.  Monitor your temperature if you aren't feeling well. If you develop a fever, please return for re-evaluation. I don't see anything concerning on today's exam. You were slightly tender at the right lower stomach.  If this pain increases significantly, return for re-evaluation (your appendix is on the right side of the lower stomach).  We will contact you with your blood tests within the next 1-2 days.

## 2022-05-16 NOTE — Progress Notes (Signed)
Chief Complaint  Patient presents with   Nausea    Monday at work he was dizzy and several times today. Having night sweats for months byt really bad last night. Now some nausea and stomach cramps and chills.    5/13 he felt lightheaded, felt like he was on the verge of fainting for 4 hours. 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  He reports he wasn't able to walk straight.  Denies any vertigo.  He has only slight faint sensation today (much better than Monday).  He felt like he might fall stepping on the curb to come into the office today. Today he woke up with nausea, lasted for a couple of hours. He had sausage biscuit and large orange juice at [employer]. He later developed stomach cramps (not immediately).. He has h/o Crohns, has been very well controlled.  He didn't have any diarrhea or any bowel changes.  Currently feels fine. He previously was prescribed bentyl, but never worked for his cramps, so he didn't take it with the cramps today. He later got chills, hands felt ice cold.  Last BM was yesterday, normal (was the first time in a few days).  No changes in bowel habits, this isn't unusual for him.  He reports that for the last few months he has been waking up with sweats at night, sheets are drenched.  It was bad last night. Lost 20# in 2 months. Skips lunch 1/2 the time, not hungry for it.  No sick contacts.  He is requesting HIV test today He missed one dose of PREP in January after high risk sex (but took the other days)   PMH, PSH, SH reviewed  Pt with Crohn's, hypothyroidism, HTN, mental health problems, under care of psych.  Recent labs reviewed: Lab Results  Component Value Date   WBC 7.2 01/19/2022   HGB 15.4 01/19/2022   HCT 45.9 01/19/2022   MCV 90 01/19/2022   PLT 347 01/19/2022   Lab Results  Component Value Date   TSH 0.756 02/03/2021    Outpatient Encounter Medications as of 05/16/2022  Medication Sig Note   aspirin 81  MG EC tablet Take 81 mg by mouth daily. Swallow whole.    budesonide (ENTOCORT EC) 3 MG 24 hr capsule TAKE 3 CAPSULES BY MOUTH EVERY DAY    busPIRone (BUSPAR) 30 MG tablet TAKE 1 TABLET BY MOUTH TWICE DAILY    donepezil (ARICEPT) 10 MG tablet Take 1 tablet (10 mg total) by mouth at bedtime.    fluvoxaMINE (LUVOX) 100 MG tablet TAKE 4 TABLETS(400 MG) BY MOUTH AT BEDTIME    lamoTRIgine (LAMICTAL) 200 MG tablet TAKE 1 TABLET(200 MG) BY MOUTH DAILY    levothyroxine (SYNTHROID) 75 MCG tablet TAKE 1 TABLET(75 MCG) BY MOUTH DAILY    lipase/protease/amylase (CREON) 36000 UNITS CPEP capsule TAKE 4 CAPSULES BY MOUTH BEFORE A MEAL AND 2 WITH EACH SNACK    loperamide (IMODIUM) 2 MG capsule Take 1 capsule (2 mg total) by mouth every 4 (four) hours. As needed 01/19/2022: TID   losartan (COZAAR) 100 MG tablet TAKE 1 TABLET BY MOUTH EVERY DAY    OLANZapine (ZYPREXA) 15 MG tablet TAKE 1 TABLET BY MOUTH DAILY AT BEDTIME    sodium chloride 1 g tablet TAKE 1 TABLET(1 GRAM) BY MOUTH DAILY    [DISCONTINUED] traZODone (DESYREL) 100 MG tablet Take 50-100 mg by mouth at bedtime as needed.    dicyclomine (BENTYL) 10 MG capsule TAKE 1  CAPSULE BY MOUTH 3 TIMES DAILY AS NEEDED FOR ABDOMINAL PAIN OR CRAMPING (Patient not taking: Reported on 05/16/2022) 05/16/2022: Stopped taking a month ago   emtricitabine-tenofovir (TRUVADA) 200-300 MG tablet Take 1 tablet by mouth daily. (Patient not taking: Reported on 05/16/2022)    [DISCONTINUED] mupirocin ointment (BACTROBAN) 2 % Apply 1 Application topically 2 (two) times daily.    No facility-administered encounter medications on file as of 05/16/2022.   Allergies  Allergen Reactions   Erythromycin Other (See Comments)    Patient said he is "allergic," but the reaction was not cited   Penicillins Other (See Comments)    "It may kill me"   Tetracyclines & Related Hives    Lab Results  Component Value Date   TSH 0.756 02/03/2021   ROS: No blood in stool. Bowels are irregular,  per HPI.  No fever.  Chills per HPI. Nausea earlier today, resolved, same with stomach cramps. Sweats and weight loss per HPI. Some runny nose. Claritin didn't help.  No sinus pain or cough No chest pain or shortness of breath   PHYSICAL EXAM:  BP 130/70   Pulse 80   Temp (!) 96.7 F (35.9 C) (Tympanic)   Ht 6\' 3"  (1.905 m)   Wt 230 lb 6.4 oz (104.5 kg)   BMI 28.80 kg/m   Wt Readings from Last 3 Encounters:  05/16/22 230 lb 6.4 oz (104.5 kg)  04/09/22 238 lb (108 kg)  03/21/22 240 lb (108.9 kg)   Pleasant male, appears somewhat older than stated age. HEENT: conjunctiva and sclera are clear, EOMI. OP clear, moist mucus membranes. Sinuses nontender Neck: no lymphadenopathy or mass Heart: regular rate and rhythm Lungs: clear bilaterally Abdomen: soft, normal bowel sounds. No organomegaly or mass. Mildly tender in RLQ, no rebound, guarding or mass Extremities: no edema Back: no CVA tenderness Neuro: alert and oriented, normal strength, gait.    ASSESSMENT/PLAN:  Hypothyroidism, unspecified type - last TSH >1 yr ago, with wt loss, sweats - Plan: TSH  High risk sexual behavior, unspecified type - pt requesting HIV test.  Uses PREP, but missed a pill in January. - Plan: HIV Antibody (routine testing w rflx), RPR  Night sweats - Plan: TSH  Right lower quadrant abdominal tenderness without rebound tenderness - very minimal.  Advised to seek f/u care if increasing pain, fever, vomiting. Ddx reviewed (has appendix)  Dizziness - poss LH, vs equilibrium. Encouraged increased water intake.  anithistamine to treat allergies. Not orthostatic (SBP dropped 10pts when sat up)  Nausea - this morning, resolved. pt with h/o GERD, discussed this as part of Ddx. Discussed OTC meds for prn use.  OK TO LM WITH RESULTS--[phone]  To f/u if sx persist or worsen.  I spent 38 minutes dedicated to the care of this patient, including pre-visit review of records, face to face time,  post-visit ordering of testing and documentation.

## 2022-05-16 NOTE — Telephone Encounter (Signed)
I find it difficult to understand that with severe OSA his doctor told him he doesn't need CPAP.  Untreated severe OSA can cause night sweats and worse.  I would recommend he use CPAP bc not treating severe OSA is dangerous.  However, re: his meds.  It is possible fluvoxamine is contributing to his night sweats so have him change the dosing to 200 mg in AM and HS and if it is related splitting the doses will help night sweats.

## 2022-05-16 NOTE — Telephone Encounter (Signed)
Patient lvm at 4:24 stating that for the past few months he has been experiencing very bad night sweats. He saw doctor today and was told that this could be due to a medication that he is currently taking. He asked if CC thinks that this could be related to the medication that he is taking. He is very concerned about this. Pls rtc [phone]

## 2022-05-17 ENCOUNTER — Telehealth: Payer: Self-pay | Admitting: Psychiatry

## 2022-05-17 ENCOUNTER — Ambulatory Visit: Payer: 59

## 2022-05-17 LAB — RPR: RPR Ser Ql: NONREACTIVE

## 2022-05-17 LAB — TSH: TSH: 4.08 u[IU]/mL (ref 0.450–4.500)

## 2022-05-17 LAB — HIV ANTIBODY (ROUTINE TESTING W REFLEX): HIV Screen 4th Generation wRfx: NONREACTIVE

## 2022-05-17 NOTE — Telephone Encounter (Signed)
Left message on VM per request.

## 2022-05-17 NOTE — Telephone Encounter (Signed)
Pt LVM @ 1:19p.  He said to tell Clarisse Gouge she can leave his test results on his voice mail.  Next appt 5/23

## 2022-05-17 NOTE — Telephone Encounter (Signed)
It was not test results, it was a response from Dr. Jennelle Human regarding a question yesterday. I did leave info on VM per request.

## 2022-05-17 NOTE — Telephone Encounter (Signed)
LVM to RC 

## 2022-05-18 ENCOUNTER — Telehealth: Payer: Self-pay | Admitting: Psychiatry

## 2022-05-18 NOTE — Telephone Encounter (Signed)
Patient had reported earlier this week that he was having night sweats. It was recommended to him yesterday that the fluvoxamine be split, 200 AM, 200 QHS to see if that would help with the sweats if they were related to a medication.

## 2022-05-18 NOTE — Telephone Encounter (Signed)
Addendum to attached message. Next visit is 2062/06/08. Zachary [name] called stating the Fluvoxamine that he takes AM and PM is not helping him. Could he possibly take 4/night and none in the daytime? Please call him at [phone].

## 2022-05-18 NOTE — Telephone Encounter (Signed)
LVM that patient c/o night sweats that he thought might be related to a medication. Dr. Jennelle Human made the suggestion to try splitting the dose and if it sweats were related to the medication it would improve. Patient was not notified until late yesterday afternoon. Left message that he would take 4 QHS as that was what was prescribed.

## 2022-05-21 ENCOUNTER — Ambulatory Visit: Payer: 59 | Admitting: Medical

## 2022-05-21 ENCOUNTER — Encounter: Payer: Self-pay | Admitting: Family Medicine

## 2022-05-21 VITALS — BP 122/80 | HR 83 | Wt 230.0 lb

## 2022-05-21 DIAGNOSIS — Z79899 Other long term (current) drug therapy: Secondary | ICD-10-CM

## 2022-05-21 DIAGNOSIS — R251 Tremor, unspecified: Secondary | ICD-10-CM | POA: Diagnosis not present

## 2022-05-21 DIAGNOSIS — Z136 Encounter for screening for cardiovascular disorders: Secondary | ICD-10-CM | POA: Diagnosis not present

## 2022-05-21 DIAGNOSIS — R208 Other disturbances of skin sensation: Secondary | ICD-10-CM | POA: Diagnosis not present

## 2022-05-21 NOTE — Progress Notes (Signed)
Subjective:  Zachary [name] is a 62 y.o. male who presents for Chief Complaint  Patient presents with   Shaking    Shakes- torso, arms, and hands will get cold and will start shaking. This has been going on about a month     Here for feeling cold in torso causing him to have shakes in torso arms and hands.  Doesn't recall legs doing this.   Happens several times per week for past month.  Has Crohns, and does get belly pain but doesn't think its related.  No recent fever.  No recent urinary changes.  No chest pain, no SOB.  No paresthesias, no new medications in past month or 2.  No other aggravating or relieving factors.    No other c/o.  Past Medical History:  Diagnosis Date   Anxiety    Crohn disease (HCC)    pt reports subsequent testing was normal   Depression    GERD (gastroesophageal reflux disease)    Herpes simplex    Hoarding disorder    Hyperlipidemia    Hypertension    IBS (irritable bowel syndrome)    Incontinence    Current Outpatient Medications on File Prior to Visit  Medication Sig Dispense Refill   aspirin 81 MG EC tablet Take 81 mg by mouth daily. Swallow whole.     budesonide (ENTOCORT EC) 3 MG 24 hr capsule TAKE 3 CAPSULES BY MOUTH EVERY DAY 90 capsule 5   busPIRone (BUSPAR) 30 MG tablet TAKE 1 TABLET BY MOUTH TWICE DAILY 60 tablet 1   donepezil (ARICEPT) 10 MG tablet Take 1 tablet (10 mg total) by mouth at bedtime. 90 tablet 3   fluvoxaMINE (LUVOX) 100 MG tablet TAKE 4 TABLETS(400 MG) BY MOUTH AT BEDTIME 120 tablet 2   lamoTRIgine (LAMICTAL) 200 MG tablet TAKE 1 TABLET(200 MG) BY MOUTH DAILY 90 tablet 0   levothyroxine (SYNTHROID) 75 MCG tablet TAKE 1 TABLET(75 MCG) BY MOUTH DAILY 90 tablet 1   lipase/protease/amylase (CREON) 36000 UNITS CPEP capsule TAKE 4 CAPSULES BY MOUTH BEFORE A MEAL AND 2 WITH EACH SNACK 1440 capsule 1   losartan (COZAAR) 100 MG tablet TAKE 1 TABLET BY MOUTH EVERY DAY 90 tablet 0   OLANZapine (ZYPREXA) 15 MG tablet TAKE 1 TABLET BY  MOUTH DAILY AT BEDTIME 30 tablet 1   sodium chloride 1 g tablet TAKE 1 TABLET(1 GRAM) BY MOUTH DAILY 90 tablet 0   dicyclomine (BENTYL) 10 MG capsule TAKE 1 CAPSULE BY MOUTH 3 TIMES DAILY AS NEEDED FOR ABDOMINAL PAIN OR CRAMPING (Patient not taking: Reported on 05/16/2022) 90 capsule 2   emtricitabine-tenofovir (TRUVADA) 200-300 MG tablet Take 1 tablet by mouth daily. (Patient not taking: Reported on 05/16/2022) 30 tablet 3   loperamide (IMODIUM) 2 MG capsule Take 1 capsule (2 mg total) by mouth every 4 (four) hours. As needed (Patient not taking: Reported on 05/21/2022) 60 capsule 2   No current facility-administered medications on file prior to visit.     The following portions of the patient's history were reviewed and updated as appropriate: allergies, current medications, past family history, past medical history, past social history, past surgical history and problem list.  ROS Otherwise as in subjective above    Objective: BP 122/80   Pulse 83   Wt 230 lb (104.3 kg)   BMI 28.75 kg/m   Wt Readings from Last 3 Encounters:  05/21/22 230 lb (104.3 kg)  05/16/22 230 lb 6.4 oz (104.5 kg)  04/09/22 238 lb (  108 kg)   General appearance: alert, no distress, well developed, well nourished Arms nontender, no obvious discoloration or swelling Heart: RRR, normal S1, S2, no murmurs Lungs: CTA bilaterally, no wheezes, rhonchi, or rales Abdomen: +bs, soft, non tender, non distended, no masses, no hepatomegaly, no splenomegaly, no bruits Pulses: 2+ radial pulses, 2+ pedal pulses, normal cap refill Ext: no edema    Assessment: Encounter Diagnoses  Name Primary?   Cold skin Yes   Shakiness    High risk medication use    Polypharmacy    Screening for heart disease      Plan: We discussed his symptoms which are kind of vague.  No clear etiology.  Labs as below.  If labs are normal, discussed him speaking with psychiatry about potential side effects of medication.  This is similar to  his discussion with Dr. Lynelle Doctor last week when he was here for dizziness just in the past week  We also discussed that he will follow-up with gastroenterology in the near future regarding Crohn's.  Consider scan of abdomen pelvis if he continues to have unusual symptoms  Consider CT coronary heart disease screening  [name] was seen today for shaking.  Diagnoses and all orders for this visit:  Cold skin -     Comprehensive metabolic panel -     CBC -     Magnesium  Shakiness -     Comprehensive metabolic panel -     CBC -     Magnesium  High risk medication use -     Comprehensive metabolic panel -     CBC -     Magnesium  Polypharmacy -     Comprehensive metabolic panel -     CBC -     Magnesium  Screening for heart disease    Follow up: pending labs

## 2022-05-22 LAB — COMPREHENSIVE METABOLIC PANEL
ALT: 19 IU/L (ref 0–44)
AST: 19 IU/L (ref 0–40)
Albumin/Globulin Ratio: 1.7 (ref 1.2–2.2)
Albumin: 4.2 g/dL (ref 3.8–4.9)
Alkaline Phosphatase: 126 IU/L — ABNORMAL HIGH (ref 44–121)
BUN/Creatinine Ratio: 13 (ref 10–24)
BUN: 14 mg/dL (ref 8–27)
Bilirubin Total: 0.3 mg/dL (ref 0.0–1.2)
CO2: 25 mmol/L (ref 20–29)
Calcium: 9.3 mg/dL (ref 8.6–10.2)
Chloride: 100 mmol/L (ref 96–106)
Creatinine, Ser: 1.09 mg/dL (ref 0.76–1.27)
Globulin, Total: 2.5 g/dL (ref 1.5–4.5)
Glucose: 98 mg/dL (ref 70–99)
Potassium: 5 mmol/L (ref 3.5–5.2)
Sodium: 136 mmol/L (ref 134–144)
Total Protein: 6.7 g/dL (ref 6.0–8.5)
eGFR: 78 mL/min/{1.73_m2} (ref >59)

## 2022-05-22 LAB — CBC
Hematocrit: 44 % (ref 37.5–51.0)
Hemoglobin: 15 g/dL (ref 13.0–17.7)
MCH: 31.5 pg (ref 26.6–33.0)
MCHC: 34.1 g/dL (ref 31.5–35.7)
MCV: 92 fL (ref 79–97)
Platelets: 349 10*3/uL (ref 150–450)
RBC: 4.76 x10E6/uL (ref 4.14–5.80)
RDW: 13.7 % (ref 11.6–15.4)
WBC: 9.2 10*3/uL (ref 3.4–10.8)

## 2022-05-22 LAB — MAGNESIUM: Magnesium: 2.4 mg/dL — ABNORMAL HIGH (ref 1.6–2.3)

## 2022-05-22 NOTE — Progress Notes (Signed)
Labs show alkaline phosphatase slightly elevated but glucose liver kidney and electrolytes are okay.  Magnesium was a  10th of a point elevated, so likely not an issue.  Blood count okay.  Alkaline phosphatase can be elevated for variety reasons but it was only slightly elevated.  This could be due to low vitamin D or other things.  If not already taking vitamin D supplement I would recommend he start taking 1000 units of vitamin D daily over-the-counter.  I would recommend he do a follow-up with his psychiatrist to review his current medicines and potential side effects given his 2 different visits recently for dizziness and shakes and cold torso symptoms.  Thyroid medicine dose is okay as his recent lab was okay.  Another consideration will be referral to neurology for further evaluation of his symptoms.  I would recommend though that he start with psychiatry consult about his medications

## 2022-05-24 ENCOUNTER — Other Ambulatory Visit: Payer: Self-pay | Admitting: Family Medicine

## 2022-05-24 ENCOUNTER — Ambulatory Visit (INDEPENDENT_AMBULATORY_CARE_PROVIDER_SITE_OTHER): Payer: 59 | Admitting: Psychiatry

## 2022-05-24 ENCOUNTER — Encounter: Payer: Self-pay | Admitting: Psychiatry

## 2022-05-24 DIAGNOSIS — F331 Major depressive disorder, recurrent, moderate: Secondary | ICD-10-CM

## 2022-05-24 DIAGNOSIS — G4733 Obstructive sleep apnea (adult) (pediatric): Secondary | ICD-10-CM

## 2022-05-24 DIAGNOSIS — F423 Hoarding disorder: Secondary | ICD-10-CM

## 2022-05-24 DIAGNOSIS — F4010 Social phobia, unspecified: Secondary | ICD-10-CM | POA: Diagnosis not present

## 2022-05-24 DIAGNOSIS — F422 Mixed obsessional thoughts and acts: Secondary | ICD-10-CM | POA: Diagnosis not present

## 2022-05-24 DIAGNOSIS — G3184 Mild cognitive impairment, so stated: Secondary | ICD-10-CM

## 2022-05-24 DIAGNOSIS — F5105 Insomnia due to other mental disorder: Secondary | ICD-10-CM

## 2022-05-24 MED ORDER — DONEPEZIL HCL 10 MG PO TABS: 10.0000 mg | ORAL_TABLET | Freq: Every day | ORAL | 3 refills | Status: DC

## 2022-05-24 MED ORDER — BUSPIRONE HCL 30 MG PO TABS
30.0000 mg | ORAL_TABLET | Freq: Two times a day (BID) | ORAL | 1 refills | Status: DC
Start: 2022-05-24 — End: 2022-08-28

## 2022-05-24 MED ORDER — OLANZAPINE 15 MG PO TABS
15.0000 mg | ORAL_TABLET | Freq: Every day | ORAL | 1 refills | Status: DC
Start: 2022-05-24 — End: 2022-11-27

## 2022-05-24 MED ORDER — LAMOTRIGINE 200 MG PO TABS: ORAL_TABLET | ORAL | 1 refills | Status: DC

## 2022-05-24 MED ORDER — FLUVOXAMINE MALEATE 100 MG PO TABS
ORAL_TABLET | ORAL | 1 refills | Status: DC
Start: 2022-05-24 — End: 2022-08-28
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
## 2022-06-07 ENCOUNTER — Ambulatory Visit: Payer: 59

## 2022-06-21 ENCOUNTER — Other Ambulatory Visit: Payer: Self-pay | Admitting: Psychiatry

## 2022-06-21 DIAGNOSIS — F331 Major depressive disorder, recurrent, moderate: Secondary | ICD-10-CM

## 2022-06-26 ENCOUNTER — Ambulatory Visit: Payer: 59

## 2022-07-04 ENCOUNTER — Other Ambulatory Visit: Payer: Self-pay | Admitting: Medical

## 2022-07-04 ENCOUNTER — Other Ambulatory Visit: Payer: Self-pay | Admitting: Family Medicine

## 2022-07-04 ENCOUNTER — Other Ambulatory Visit: Payer: Self-pay | Admitting: Gastroenterology

## 2022-07-04 DIAGNOSIS — E039 Hypothyroidism, unspecified: Secondary | ICD-10-CM

## 2022-07-06 NOTE — Telephone Encounter (Signed)
This was discontinued.

## 2022-07-12 ENCOUNTER — Encounter: Payer: Self-pay | Admitting: Family Medicine

## 2022-07-12 ENCOUNTER — Ambulatory Visit (INDEPENDENT_AMBULATORY_CARE_PROVIDER_SITE_OTHER): Payer: 59 | Admitting: Family Medicine

## 2022-07-12 VITALS — BP 110/80 | HR 57 | Temp 97.7°F | Resp 18 | Wt 231.6 lb

## 2022-07-12 DIAGNOSIS — K645 Perianal venous thrombosis: Secondary | ICD-10-CM | POA: Diagnosis not present

## 2022-07-12 NOTE — Progress Notes (Signed)
   Subjective:    Patient ID: Zachary [name], male    DOB: 03-11-1960, 62 y.o.   MRN: [id number]  HPI He notes swelling and minimal discomfort as well as a hard feeling in the rectal area and thinks this is a hemorrhoid.   Review of Systems     Objective:    Physical Exam Rectal exam does show a thrombosed hemorrhoid that is nontender to palpation at the 9 o'clock position.       Assessment & Plan:  Thrombosed hemorrhoids Discussed her treatment of the thrombosed hemorrhoid with possible surgery however he is would like to avoid that.  Recommend heat to the area as well as ensuring that he has softer BMs.  If the symptoms get worse especially in regard to pain, he is to return. He did mention to want to talk about OSA and I recommend that he set up an appointment so we can spend more time to talk about this.

## 2022-08-02 ENCOUNTER — Other Ambulatory Visit: Payer: Self-pay | Admitting: Gastroenterology

## 2022-08-16 ENCOUNTER — Other Ambulatory Visit: Payer: Self-pay | Admitting: Medical

## 2022-08-28 ENCOUNTER — Ambulatory Visit (INDEPENDENT_AMBULATORY_CARE_PROVIDER_SITE_OTHER): Payer: BC Managed Care – PPO | Admitting: Psychiatry

## 2022-08-28 ENCOUNTER — Encounter: Payer: Self-pay | Admitting: Psychiatry

## 2022-08-28 DIAGNOSIS — F4010 Social phobia, unspecified: Secondary | ICD-10-CM | POA: Diagnosis not present

## 2022-08-28 DIAGNOSIS — F331 Major depressive disorder, recurrent, moderate: Secondary | ICD-10-CM

## 2022-08-28 DIAGNOSIS — G3184 Mild cognitive impairment, so stated: Secondary | ICD-10-CM

## 2022-08-28 DIAGNOSIS — G4733 Obstructive sleep apnea (adult) (pediatric): Secondary | ICD-10-CM | POA: Diagnosis not present

## 2022-08-28 DIAGNOSIS — F422 Mixed obsessional thoughts and acts: Secondary | ICD-10-CM | POA: Diagnosis not present

## 2022-08-28 DIAGNOSIS — F5105 Insomnia due to other mental disorder: Secondary | ICD-10-CM

## 2022-08-28 DIAGNOSIS — F423 Hoarding disorder: Secondary | ICD-10-CM

## 2022-08-28 MED ORDER — BUSPIRONE HCL 30 MG PO TABS
30.0000 mg | ORAL_TABLET | Freq: Two times a day (BID) | ORAL | 1 refills | Status: DC
Start: 2022-08-28 — End: 2022-10-05

## 2022-08-28 MED ORDER — LAMOTRIGINE 200 MG PO TABS
ORAL_TABLET | ORAL | 1 refills | Status: DC
Start: 2022-08-28 — End: 2022-12-14

## 2022-08-28 MED ORDER — FLUVOXAMINE MALEATE 100 MG PO TABS: ORAL_TABLET | ORAL | 1 refills | Status: DC

## 2022-08-28 NOTE — Patient Instructions (Addendum)
Trial B1 vitamin for GI bloating 500 mg daily Call [facility] for counselor
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
## 2022-08-30 ENCOUNTER — Other Ambulatory Visit: Payer: Self-pay | Admitting: Medical

## 2022-08-30 ENCOUNTER — Other Ambulatory Visit: Payer: Self-pay | Admitting: Gastroenterology

## 2022-09-04 ENCOUNTER — Other Ambulatory Visit: Payer: Self-pay | Admitting: Psychiatry

## 2022-09-04 ENCOUNTER — Encounter: Payer: Self-pay | Admitting: Physician Assistant

## 2022-09-04 DIAGNOSIS — F422 Mixed obsessional thoughts and acts: Secondary | ICD-10-CM

## 2022-09-04 DIAGNOSIS — F4010 Social phobia, unspecified: Secondary | ICD-10-CM

## 2022-09-04 DIAGNOSIS — F331 Major depressive disorder, recurrent, moderate: Secondary | ICD-10-CM

## 2022-09-15 ENCOUNTER — Other Ambulatory Visit: Payer: Self-pay | Admitting: Medical

## 2022-09-20 ENCOUNTER — Ambulatory Visit (INDEPENDENT_AMBULATORY_CARE_PROVIDER_SITE_OTHER): Payer: BC Managed Care – PPO | Admitting: Family Medicine

## 2022-09-20 ENCOUNTER — Encounter: Payer: Self-pay | Admitting: Family Medicine

## 2022-09-20 VITALS — BP 120/78 | HR 66 | Ht 74.75 in | Wt 229.4 lb

## 2022-09-20 DIAGNOSIS — K5080 Crohn's disease of both small and large intestine without complications: Secondary | ICD-10-CM

## 2022-09-20 DIAGNOSIS — E782 Mixed hyperlipidemia: Secondary | ICD-10-CM

## 2022-09-20 DIAGNOSIS — Z23 Encounter for immunization: Secondary | ICD-10-CM | POA: Diagnosis not present

## 2022-09-20 DIAGNOSIS — R141 Gas pain: Secondary | ICD-10-CM

## 2022-09-20 DIAGNOSIS — M48061 Spinal stenosis, lumbar region without neurogenic claudication: Secondary | ICD-10-CM

## 2022-09-20 DIAGNOSIS — E039 Hypothyroidism, unspecified: Secondary | ICD-10-CM

## 2022-09-20 DIAGNOSIS — F4010 Social phobia, unspecified: Secondary | ICD-10-CM | POA: Diagnosis not present

## 2022-09-20 DIAGNOSIS — Z8042 Family history of malignant neoplasm of prostate: Secondary | ICD-10-CM

## 2022-09-20 DIAGNOSIS — K8689 Other specified diseases of pancreas: Secondary | ICD-10-CM | POA: Diagnosis not present

## 2022-09-20 DIAGNOSIS — N3281 Overactive bladder: Secondary | ICD-10-CM

## 2022-09-20 DIAGNOSIS — G4733 Obstructive sleep apnea (adult) (pediatric): Secondary | ICD-10-CM

## 2022-09-20 DIAGNOSIS — Z8619 Personal history of other infectious and parasitic diseases: Secondary | ICD-10-CM

## 2022-09-20 DIAGNOSIS — Z0000 Encounter for general adult medical examination without abnormal findings: Secondary | ICD-10-CM

## 2022-09-20 DIAGNOSIS — Z79899 Other long term (current) drug therapy: Secondary | ICD-10-CM

## 2022-09-20 DIAGNOSIS — E038 Other specified hypothyroidism: Secondary | ICD-10-CM

## 2022-09-20 DIAGNOSIS — F422 Mixed obsessional thoughts and acts: Secondary | ICD-10-CM

## 2022-09-20 DIAGNOSIS — J302 Other seasonal allergic rhinitis: Secondary | ICD-10-CM

## 2022-09-20 DIAGNOSIS — I10 Essential (primary) hypertension: Secondary | ICD-10-CM

## 2022-09-20 DIAGNOSIS — F429 Obsessive-compulsive disorder, unspecified: Secondary | ICD-10-CM

## 2022-09-20 MED ORDER — LOSARTAN POTASSIUM 100 MG PO TABS
100.0000 mg | ORAL_TABLET | Freq: Every day | ORAL | 3 refills | Status: DC
Start: 2022-09-20 — End: 2023-11-25

## 2022-09-20 MED ORDER — LEVOTHYROXINE SODIUM 75 MCG PO TABS: ORAL_TABLET | ORAL | 3 refills | Status: DC

## 2022-09-20 NOTE — Progress Notes (Signed)
Complete physical exam  Patient: Zachary [name]   DOB: 04-May-1960   62 y.o. Male  MRN: [id number]  Subjective:     Zachary [name] is a 62 y.o. male who presents today for a complete physical exam. He reports consuming a general diet. The patient does not participate in regular exercise at present. He generally feels fairly well. He reports sleeping poorly.  He does have a history of OSA and has stopped using CPAP because he states it really did not help.  He also complains of back pain.  He does have a job that requires lifting.  Does have a history of spinal stenosis but presently has not taking any medications for this.  He continues to be followed by psychiatry and he states his present medication regimen seems to be working.  His allergies are under good control.  He does have a history of pancreatic insufficiency and is using Creon.  He follows up regularly with gastroenterology for his underlying Crohn's disease as well as issues with flatulence.  He continues on his thyroid medication and is having no difficulty with that.   Most recent fall risk assessment:    09/20/2022    9:08 AM  Fall Risk   Falls in the past year? 1  Number falls in past yr: 1  Comment fell out of bed twice from nightmares  Injury with Fall? 0  Comment scrapped knee  Follow up Falls evaluation completed     Most recent depression screenings:    09/20/2022    9:08 AM 12/26/2021   11:12 AM  PHQ 2/9 Scores  PHQ - 2 Score 0 0  PHQ- 9 Score  5    Vision:Within last year and Dental: No current dental problems and Last dental visit: Within last year    Patient Care Team: Ronnald Nian, MD as PCP - General (Family Medicine)   Outpatient Medications Prior to Visit  Medication Sig   aspirin 81 MG EC tablet Take 81 mg by mouth daily. Swallow whole.   budesonide (ENTOCORT EC) 3 MG 24 hr capsule TAKE 3 CAPSULES BY MOUTH EVERY DAY   busPIRone (BUSPAR) 30 MG tablet Take 1 tablet (30 mg total) by  mouth 2 (two) times daily.   CREON 36000-114000 units CPEP capsule TAKE 4 CAPSULES BY MOUTH BEFORE A MEAL AND 2 CAPSULES WITH EACH SNACK   dicyclomine (BENTYL) 10 MG capsule TAKE 1 CAPSULE BY MOUTH 3 TIMES DAILY AS NEEDED FOR ABDOMINAL PAIN OR CRAMPING   donepezil (ARICEPT) 10 MG tablet Take 1 tablet (10 mg total) by mouth at bedtime.   fluvoxaMINE (LUVOX) 100 MG tablet TAKE 4 TABLETS(400 MG) BY MOUTH AT BEDTIME   lamoTRIgine (LAMICTAL) 200 MG tablet TAKE 1 TABLET(200 MG) BY MOUTH DAILY   loperamide (IMODIUM) 2 MG capsule Take 1 capsule (2 mg total) by mouth every 4 (four) hours. As needed   OLANZapine (ZYPREXA) 15 MG tablet Take 1 tablet (15 mg total) by mouth at bedtime.   sodium chloride 1 g tablet TAKE 1 TABLET(1 GRAM) BY MOUTH DAILY (Patient not taking: Reported on 09/20/2022)   [DISCONTINUED] levothyroxine (SYNTHROID) 75 MCG tablet TAKE 1 TABLET BY MOUTH ONCE DAILY   [DISCONTINUED] losartan (COZAAR) 100 MG tablet TAKE 1 TABLET BY MOUTH EVERY DAY   No facility-administered medications prior to visit.    Review of Systems  All other systems reviewed and are negative. Family and social history as well as health maintenance and immunizations was reviewed  Objective:       Physical Exam     Alert and in no distress. Tympanic membranes and canals are normal. Pharyngeal area is normal. Neck is supple without adenopathy or thyromegaly. Cardiac exam shows a regular sinus rhythm without murmurs or gallops. Lungs are clear to auscultation.  Assessment & Plan:    Routine general medical examination at a health care facility  Spinal stenosis of lumbar region without neurogenic claudication      Recommend Tylenol as needed for the back pain. Social anxiety disorder      Continue with psychiatry. Seasonal allergies      Continue with OTC medications. Pancreatic insufficiency       Continue on Creon OSA (obstructive sleep apnea)      ENT referral made Mixed obsessional  thoughts and acts  Other specified hypothyroidism - Plan: levothyroxine (SYNTHROID) 75 MCG tablet  Family history of prostate cancer - Plan: PSA  Essential hypertension - Plan: losartan (COZAAR) 100 MG tablet  Crohn's disease of both small and large intestine without complication (HCC)  FLATULENCE ERUCTATION AND GAS PAIN            Continue to be followed by GI High risk medication use  History of herpes labialis           Call when he has an outbreak Need for COVID-19 vaccine - Plan: Flu vaccine trivalent PF, 6mos and older(Flulaval,Afluria,Fluarix,Fluzone), Pfizer Comirnaty Covid -19 Vaccine 75yrs and older  Mixed hyperlipidemia - Plan: Lipid panel  Obsessive-compulsive disorder, unspecified type  Immunization History  Administered Date(s) Administered   COVID-19, mRNA, vaccine(Comirnaty)12 years and older 11/21/2021   DTaP 04/25/2010   Influenza Split 10/29/2005, 09/25/2011, 09/01/2013   Influenza Whole 10/06/2008, 09/21/2009, 09/15/2012   Influenza,inj,Quad PF,6+ Mos 10/22/2017, 10/22/2017, 10/01/2018, 09/13/2020, 09/18/2021   Influenza-Unspecified 08/18/2014, 08/04/2015, 10/10/2019   Moderna Covid-19 Vaccine Bivalent Booster 52yrs & up 01/17/2021   Moderna Sars-Covid-2 Vaccination 03/31/2019, 05/08/2019, 11/27/2019   Tdap 08/04/2015   Zoster Recombinant(Shingrix) 06/08/2016, 08/08/2016    Health Maintenance  Topic Date Due   INFLUENZA VACCINE  08/02/2022   COVID-19 Vaccine (6 - 2023-24 season) 09/02/2022   Colonoscopy  01/24/2025   DTaP/Tdap/Td (3 - Td or Tdap) 08/03/2025   Hepatitis C Screening  Completed   HIV Screening  Completed   Zoster Vaccines- Shingrix  Completed   HPV VACCINES  Aged Out     Problem List Items Addressed This Visit     CROHN'S DISEASE, LARGE AND SMALL INTESTINES   Essential hypertension   Relevant Medications   losartan (COZAAR) 100 MG tablet   Family history of prostate cancer   Relevant Orders   PSA   FLATULENCE ERUCTATION AND  GAS PAIN   High risk medication use   History of herpes labialis   Hypothyroidism   Relevant Medications   levothyroxine (SYNTHROID) 75 MCG tablet   Mixed hyperlipidemia   Relevant Medications   losartan (COZAAR) 100 MG tablet   Other Relevant Orders   Lipid panel   RESOLVED: Mixed obsessional thoughts and acts   OCD (obsessive compulsive disorder)   OSA (obstructive sleep apnea)   Relevant Orders   Ambulatory referral to ENT   Pancreatic insufficiency   Seasonal allergies   Social anxiety disorder   Spinal stenosis of lumbar region   Other Visit Diagnoses     Routine general medical examination at a health care facility    -  Primary   Need for COVID-19 vaccine  Relevant Orders   Flu vaccine trivalent PF, 6mos and older(Flulaval,Afluria,Fluarix,Fluzone)   Pfizer Comirnaty Covid -19 Vaccine 69yrs and older      Follow-up as needed or in 1 year    Sharlot Gowda, MD

## 2022-09-21 LAB — LIPID PANEL
Chol/HDL Ratio: 3.2 ratio (ref 0.0–5.0)
Cholesterol, Total: 156 mg/dL (ref 100–199)
HDL: 49 mg/dL (ref >39)
LDL Chol Calc (NIH): 89 mg/dL (ref 0–99)
Triglycerides: 100 mg/dL (ref 0–149)
VLDL Cholesterol Cal: 18 mg/dL (ref 5–40)

## 2022-09-21 LAB — PSA: Prostate Specific Ag, Serum: 0.7 ng/mL (ref 0.0–4.0)

## 2022-09-25 NOTE — Telephone Encounter (Signed)
Prior Authorization pending with [employer] for Fluvoxamine 100 mg #360/90 day

## 2022-09-26 ENCOUNTER — Encounter: Payer: 59 | Admitting: Family Medicine

## 2022-09-26 NOTE — Telephone Encounter (Signed)
Prior Approval received for quantity of 4/day #30 for Fluvoxamine 100 mg tablets with [employer] commercial effective through 09/24/2023

## 2022-09-26 NOTE — Telephone Encounter (Signed)
PA approved.

## 2022-10-04 ENCOUNTER — Other Ambulatory Visit: Payer: Self-pay | Admitting: Medical

## 2022-10-04 ENCOUNTER — Other Ambulatory Visit: Payer: Self-pay | Admitting: Gastroenterology

## 2022-10-05 ENCOUNTER — Other Ambulatory Visit: Payer: Self-pay | Admitting: Psychiatry

## 2022-10-05 DIAGNOSIS — F422 Mixed obsessional thoughts and acts: Secondary | ICD-10-CM

## 2022-10-05 DIAGNOSIS — F4010 Social phobia, unspecified: Secondary | ICD-10-CM

## 2022-10-09 ENCOUNTER — Encounter: Payer: Self-pay | Admitting: Family Medicine

## 2022-10-09 ENCOUNTER — Ambulatory Visit: Payer: BC Managed Care – PPO | Admitting: Family Medicine

## 2022-10-09 ENCOUNTER — Ambulatory Visit
Admission: RE | Admit: 2022-10-09 | Discharge: 2022-10-09 | Disposition: A | Discharge status: Home / Self Care | Payer: BC Managed Care – PPO | Source: Ambulatory Visit | Attending: Family Medicine | Admitting: Family Medicine

## 2022-10-09 VITALS — BP 130/80 | HR 76 | Ht 74.75 in | Wt 230.6 lb

## 2022-10-09 DIAGNOSIS — M549 Dorsalgia, unspecified: Secondary | ICD-10-CM

## 2022-10-09 DIAGNOSIS — M47814 Spondylosis without myelopathy or radiculopathy, thoracic region: Secondary | ICD-10-CM | POA: Diagnosis not present

## 2022-10-09 DIAGNOSIS — M5135 Other intervertebral disc degeneration, thoracolumbar region: Secondary | ICD-10-CM | POA: Diagnosis not present

## 2022-10-09 NOTE — Progress Notes (Signed)
Subjective:    Patient ID: Zachary [name], male    DOB: [birth year], 62 y.o.   MRN: [id number]  HPI He complains of a 19-month history of upper back pain that does tend to get worse with increased physical activity.  No numbness, tingling or weakness.  He points to the upper lumbar/thoracic area.  He states that Tylenol does help relieve the symptoms.   Review of Systems     Objective:    Physical Exam Alert and in no distress.  No palpable tenderness to his spine or paravertebral muscles.  Slight discomfort when he moves the spinal laterally and also with slight flexion.  Normal motor or sensory DTRs of his lower extremities.       Assessment & Plan:  Upper back pain - Plan: DG Lumbar Spine Complete, DG Thoracic Spine W/Swimmers Discussed that if the x-rays are essentially nondiagnostic, referral to physical therapy for good back rehab program.  Continue to use Tylenol. He also states that he has not heard from ENT concerning evaluation for [facility]

## 2022-10-22 ENCOUNTER — Encounter: Payer: Self-pay | Admitting: Family Medicine

## 2022-10-22 ENCOUNTER — Ambulatory Visit
Admission: RE | Admit: 2022-10-22 | Discharge: 2022-10-22 | Disposition: A | Discharge status: Home / Self Care | Payer: BC Managed Care – PPO | Source: Ambulatory Visit | Attending: Family Medicine | Admitting: Family Medicine

## 2022-10-22 ENCOUNTER — Ambulatory Visit: Payer: BC Managed Care – PPO | Admitting: Family Medicine

## 2022-10-22 VITALS — BP 118/70 | HR 72 | Ht 74.75 in | Wt 229.0 lb

## 2022-10-22 DIAGNOSIS — Z8781 Personal history of (healed) traumatic fracture: Secondary | ICD-10-CM | POA: Diagnosis not present

## 2022-10-22 DIAGNOSIS — E039 Hypothyroidism, unspecified: Secondary | ICD-10-CM | POA: Diagnosis not present

## 2022-10-22 DIAGNOSIS — M7732 Calcaneal spur, left foot: Secondary | ICD-10-CM | POA: Diagnosis not present

## 2022-10-22 DIAGNOSIS — M19072 Primary osteoarthritis, left ankle and foot: Secondary | ICD-10-CM | POA: Diagnosis not present

## 2022-10-22 DIAGNOSIS — M79672 Pain in left foot: Secondary | ICD-10-CM

## 2022-10-22 DIAGNOSIS — R143 Flatulence: Secondary | ICD-10-CM

## 2022-10-22 DIAGNOSIS — F332 Major depressive disorder, recurrent severe without psychotic features: Secondary | ICD-10-CM | POA: Diagnosis not present

## 2022-10-22 MED ORDER — MELOXICAM 15 MG PO TABS
15.0000 mg | ORAL_TABLET | Freq: Every day | ORAL | 0 refills | Status: DC
Start: 2022-10-22 — End: 2022-11-15

## 2022-10-22 NOTE — Progress Notes (Signed)
Chief Complaint  Patient presents with   Foot Pain    Left foot pain x several months. Thinks he fx small toe a few month ago, having pain on the top of his foot. Having some issues with depression but he can't get in until Christmas with Dr.Cottle. He is having suicidal ideation and needs address to [facility]. Has been having issues with increased flatulence, said he talked to [facility] about it but he didn't know what to do and wonders if this could be effecting his depression.     Thinks he broke his left 5th toe two a few months ago. This no longer hurts, but looks different than the one on the right.  He presents today for evaluation of pain across the top of his left foot, that started 1-2 months ago. No change in activity level or in his shoes. Denies any trauma. Denies any swelling. Hurts to stand, hurts to walk. No pain at night or at rest. Hasn't tried anything to treat this yet.  Depression:  He is under the care of Dr. Jennelle Human.  He reports he has been having suicidal thoughts.  When asked about plan, he states he knows his meds wouldn't kill him. He has thought about buying an extension cord, toaster, and sitting in the bathtub, but thought that might be painful. He has also considered slitting his wrists. He has no plans to follow through at this time. He states that Dr. Jennelle Human referred him to [facility] counseling, but he hasn't called to schedule an appointment. He tried to get a visit with Dr. Jennelle Human to discuss his worsening SI, states he didn't think he could get seen until December. Admits he just called to make appointment, without mentioning any specifics to the office.  Hypothyroidism--He has not taken his medication yet this morning. He may occasionally miss a dose, "pretty good" about taking it overall.  He denies changes to hair/skin/nails/energy, bowels.  Just worsening depression.  Lab Results  Component Value Date   TSH 4.080 05/16/2022    His next concern is that  of flatulence.  He has had chronic problems with this, but feels it has been worse in the last month.  He denies any changes to his diet (not eating beans/raw vegetables). Taking psyllium for >6 months. Some cheese (lasagna, chicken parmesan) weekly, but no different than usual. Has Crohn's disease, with some chronic diarrhea. States bowels are unchanged, denies blood/mucus, abdominal pain.   PMH, PSH, SH reviewed  Outpatient Encounter Medications as of 10/22/2022  Medication Sig Note   aspirin 81 MG EC tablet Take 81 mg by mouth daily. Swallow whole.    busPIRone (BUSPAR) 30 MG tablet TAKE 1 TABLET BY MOUTH 2 TIMES DAILY.    CREON 36000-114000 units CPEP capsule TAKE 4 CAPSULES BY MOUTH BEFORE A MEAL AND 2 CAPSULES WITH EACH SNACK    donepezil (ARICEPT) 10 MG tablet Take 1 tablet (10 mg total) by mouth at bedtime.    fluvoxaMINE (LUVOX) 100 MG tablet TAKE 4 TABLETS(400 MG) BY MOUTH AT BEDTIME    lamoTRIgine (LAMICTAL) 200 MG tablet TAKE 1 TABLET(200 MG) BY MOUTH DAILY    levothyroxine (SYNTHROID) 75 MCG tablet TAKE 1 TABLET BY MOUTH ONCE DAILY    loperamide (IMODIUM) 2 MG capsule Take 1 capsule (2 mg total) by mouth every 4 (four) hours. As needed 10/09/2022: Takes BID    losartan (COZAAR) 100 MG tablet Take 1 tablet (100 mg total) by mouth daily.    meloxicam (MOBIC)  15 MG tablet Take 1 tablet (15 mg total) by mouth daily. Take until your foot pain has completely resolved. Take with food.    OLANZapine (ZYPREXA) 15 MG tablet Take 1 tablet (15 mg total) by mouth at bedtime.    psyllium (REGULOID) 0.52 g capsule Take 0.52 g by mouth daily.    sodium chloride 1 g tablet TAKE 1 TABLET(1 GRAM) BY MOUTH DAILY    budesonide (ENTOCORT EC) 3 MG 24 hr capsule TAKE 3 CAPSULES BY MOUTH EVERY DAY (Patient not taking: Reported on 10/09/2022)    No facility-administered encounter medications on file as of 10/22/2022.   Allergies  Allergen Reactions   Erythromycin Other (See Comments)    Patient said  he is "allergic," but the reaction was not cited   Penicillins Other (See Comments)    "It may kill me"   Tetracyclines & Related Hives    ROS: No f/c, URI symptoms, CP, SOB, n/v. +diarrhea, no blood in the stool. No bleeding, bruising, rash. +flatulence per HPI Depression and SI per HPI L foot pain per HPI    PHYSICAL EXAM:  BP 118/70   Pulse 72   Ht 6' 2.75" (1.899 m)   Wt 229 lb (103.9 kg)   BMI 28.81 kg/m   Wt Readings from Last 3 Encounters:  10/22/22 229 lb (103.9 kg)  10/09/22 230 lb 9.6 oz (104.6 kg)  09/20/22 229 lb 6.4 oz (104.1 kg)   Pleasant, mildly depressed appearing male, in no distress HEENT: conjunctiva and sclera are clear, EOMI. Neck: no lymphadenopathy or thyromegaly Heart: regular rate and rhythm Lungs: clear bilaterally Abdomen: active bowel sounds, nondistended, soft, nontender, no mass Extremities: 2+ pulses, no edema. Left foot: Pain across proximal forefoot, laterally, with plantarflexion against resistance No pain with dorsiflexion against resistance. Mild STS, ?if slight yellow/bruise across center of foot. Tender along the mid 2nd and 3rd metatarsals 5th toe is nontender.     10/22/2022   11:18 AM 09/20/2022    9:08 AM 12/26/2021   11:12 AM 07/03/2021    9:05 AM 01/17/2021    9:39 AM  Depression screen PHQ 2/9  Decreased Interest 0 0 0 1 0  Down, Depressed, Hopeless 3 0 0 1 0  PHQ - 2 Score 3 0 0 2 0  Altered sleeping 3  1 0 0  Tired, decreased energy 0  3 0 3  Change in appetite 0  1  0  Feeling bad or failure about yourself  3  0 1 0  Trouble concentrating 0  0 1 0  Moving slowly or fidgety/restless 0  0 1 0  Suicidal thoughts 3  0 1 0  PHQ-9 Score 12  5 6 3   Difficult doing work/chores Not difficult at all  Very difficult Very difficult Not difficult at all     ASSESSMENT/PLAN:  Left foot pain - Ddx reviewed--tendonitis, stress fracture, strain. Trial of NSAID. Risks/SE reviewed. Consider podiatrist if not resolving.  Footwear discussed - Plan: DG Foot Complete Left, meloxicam (MOBIC) 15 MG tablet  Hypothyroidism, unspecified type - last TSH 4; given worsening depression, will recheck today - Plan: TSH  Severe episode of recurrent major depressive disorder, without psychotic features (HCC) - contracts for safety. Encouraged counseling. Given address for [facility] if not improving--they may be able to help get him counseling sooner.  Flatulence - worse x 1 mo, unclear reasons.  Trial dairy-free diet x 1-2 wks, and if better, Lactaid prn. To f/u with his GI if  persists/worsens. Limit gas-producing foods  I spent 46 minutes dedicated to the care of this patient, including pre-visit review of records, face to face time, post-visit ordering of testing and documentation.  Take meloxicam once daily with food for at least 7-10 days, until your left foot pain is completely better. You may take the full 15 days worth, if needed. Take it every day, until it is completely better. If it bothers your stomach, cut it in half. Do not take any ibuprofen/motrin/advil, or aleve/naproxen, or Goody/BC powder while taking the meloxicam. If you have any other pain (headache, fever), you MAY take tylenol.  Go to [address] to [facility] to get x-rays of the left foot. Remember this may take up to 2 weeks for the final result.  We will call with those results when they are back.  If you are getting much worse, let us know and we can see if they rush the reading of the x-ray.  988 is the suicide hotline.  I encourage you to go to the [facility] if you have worsening suicidal thoughts--sooner rather than later.  They may be able to help get you hooked up with a therapist sooner than calling [facility] yourself.  We are checking your thyroid, to ensure that this isn't contributing to worsening depression. Be sure to take your Synthroid on an empty stomach, first thing in the morning.  Separate it from food and  other medications by at least 30 minutes.  Take any vitamins at least 4 hours later.  Try a lactose-free diet for 1-2 weeks to see if your flatulence improves. If it does, you can try going back to your lasagna or chicken parmesan, but take a Lactaid tablet before the meal. If no difference with cutting out the dairy products/cheese, you can resume your normal diet.  Consider discussing your flatulence issues with Dr. Russella Dar. You can also consider mentioning the worsening of moods, and see if any medications can contribute (ie your Crohn's medication).  [facility] had tried to reach you regarding [facility]. Call them at [phone] option 1, option 1

## 2022-10-22 NOTE — Patient Instructions (Signed)
Take meloxicam once daily with food for at least 7-10 days, until your left foot pain is completely better. You may take the full 15 days worth, if needed. Take it every day, until it is completely better. If it bothers your stomach, cut it in half. Do not take any ibuprofen/motrin/advil, or aleve/naproxen, or Goody/BC powder while taking the meloxicam. If you have any other pain (headache, fever), you MAY take tylenol.  Go to [address] to [facility] to get x-rays of the left foot. Remember this may take up to 2 weeks for the final result.  We will call with those results when they are back.  If you are getting much worse, let us know and we can see if they rush the reading of the x-ray.  988 is the suicide hotline.  I encourage you to go to the [facility] if you have worsening suicidal thoughts--sooner rather than later.  They may be able to help get you hooked up with a therapist sooner than calling [facility] yourself.  We are checking your thyroid, to ensure that this isn't contributing to worsening depression. Be sure to take your Synthroid on an empty stomach, first thing in the morning.  Separate it from food and other medications by at least 30 minutes.  Take any vitamins at least 4 hours later.  Try a lactose-free diet for 1-2 weeks to see if your flatulence improves. If it does, you can try going back to your lasagna or chicken parmesan, but take a Lactaid tablet before the meal. If no difference with cutting out the dairy products/cheese, you can resume your normal diet.  Consider discussing your flatulence issues with Dr. Russella Dar. You can also consider mentioning the worsening of moods, and see if any medications can contribute (ie your Crohn's medication).

## 2022-10-23 LAB — TSH: TSH: 3.64 u[IU]/mL (ref 0.450–4.500)

## 2022-10-24 ENCOUNTER — Other Ambulatory Visit: Payer: Self-pay | Admitting: Family Medicine

## 2022-10-25 ENCOUNTER — Telehealth: Payer: Self-pay | Admitting: Family Medicine

## 2022-10-25 NOTE — Telephone Encounter (Signed)
Pt wants to make sure he can take generic benadryl on a regular basis with his current medications  Ok to leave on voicemail per pt

## 2022-10-31 NOTE — Addendum Note (Signed)
Addended by: Ronnald Nian on: 10/31/2022 04:08 AM   Modules accepted: Orders

## 2022-11-04 IMAGING — MR MR HEAD W/O CM
10 series · 48 of 48 positions shown · non-contrast
Comparison: No pertinent prior exams available for comparison.

CLINICAL DATA: Other urinary incontinence. Memory loss. Ataxia.
Additional history provided by scanning technologist: Forgetfulness
and balance issues

EXAM:
MRI HEAD WITHOUT CONTRAST
TECHNIQUE: Multiplanar, multiecho pulse sequences of the brain and surrounding
structures were obtained without intravenous contrast.

[Series 2: T1 · sagittal · 5.0mm · 0.47mm/px · 3 of 21 slices shown]
[im 1/21]
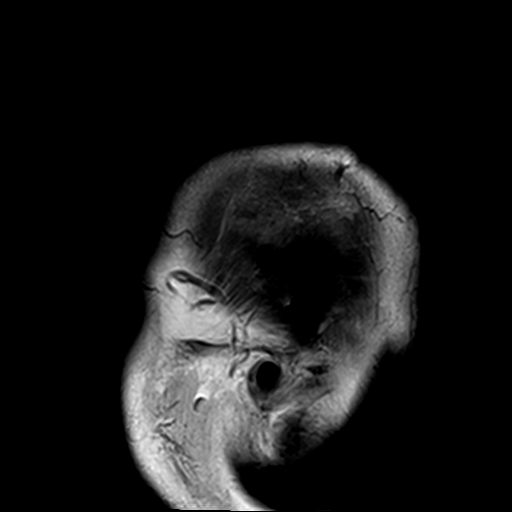
[im 11/21]
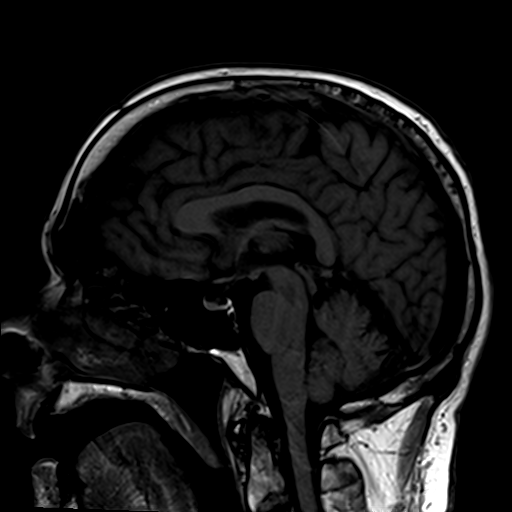
[im 21/21]
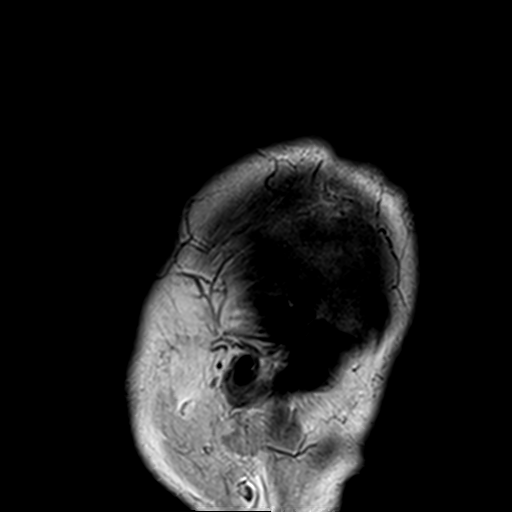

[Series 3: DWI · axial · 3.0mm · 1.95mm/px · z∈[-95,+66]mm · 9 of 109 slices shown (1 of 4)]
[im 1/109]
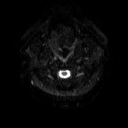
[im 14/109]
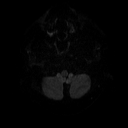
[im 28/109]
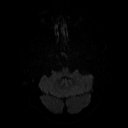
[im 41/109]
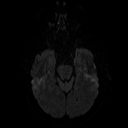
[im 55/109]
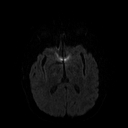
[im 68/109]
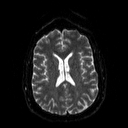
[im 82/109]
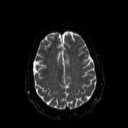
[im 95/109]
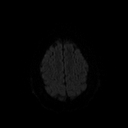
[im 109/109]
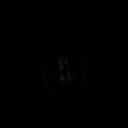

[Series 4: DWI · axial · 3.0mm · 1.95mm/px · z∈[-95,+66]mm · 5 of 55 slices shown (2 of 4)]
[im 1/55]
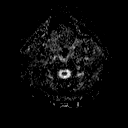
[im 14/55]
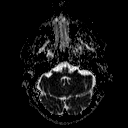
[im 28/55]
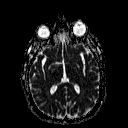
[im 41/55]
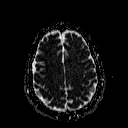
[im 55/55]
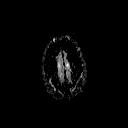

[Series 5: DWI · coronal · 5.0mm · 1.80mm/px · 6 of 67 slices shown (3 of 4)]
[im 1/67]
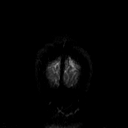
[im 14/67]
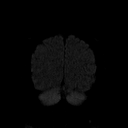
[im 27/67]
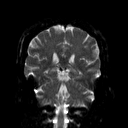
[im 40/67]
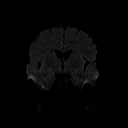
[im 53/67]
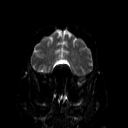
[im 67/67]
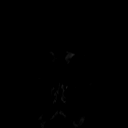

[Series 6: DWI · coronal · 5.0mm · 1.80mm/px · 3 of 36 slices shown (4 of 4)]
[im 1/36]
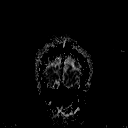
[im 18/36]
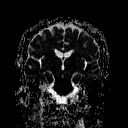
[im 36/36]
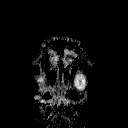

[Series 7: T2 · axial · 5.0mm · 0.51mm/px · z∈[-95,+72]mm · 2 of 25 slices shown (1 of 2)]
[im 1/25]
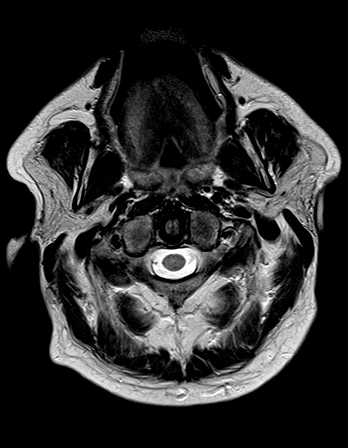
[im 25/25]
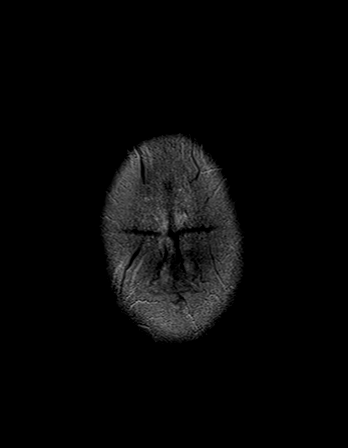

[Series 8: FLAIR · axial · 3.0mm · 0.45mm/px · z∈[-91,+66]mm · 3 of 35 slices shown]
[im 1/35]
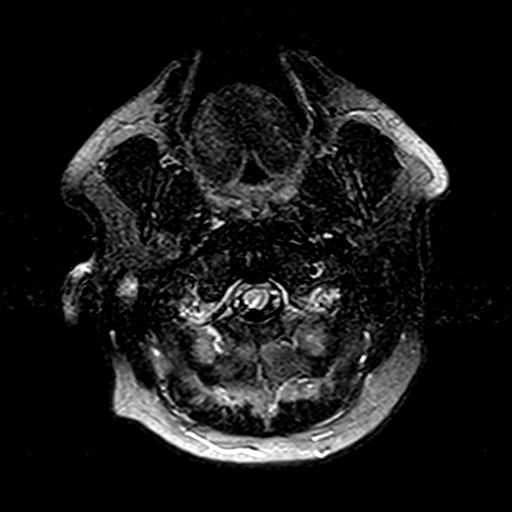
[im 18/35]
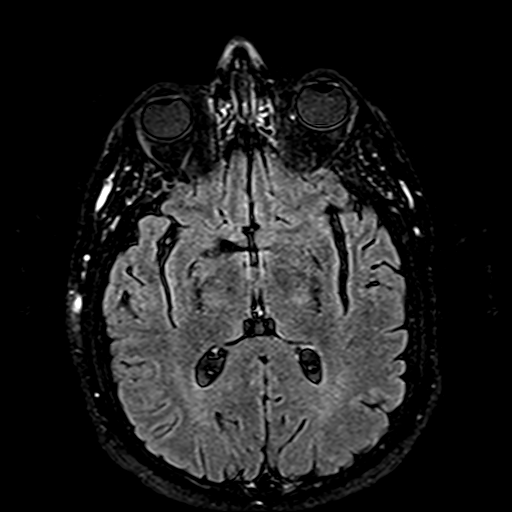
[im 35/35]
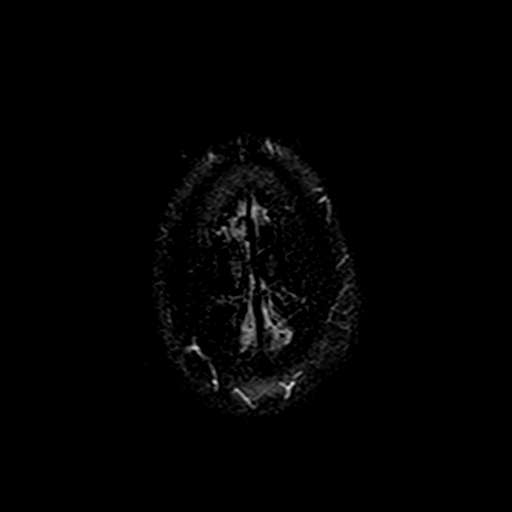

[Series 10: swi_images · axial · 4.0mm · 0.90mm/px · z∈[-90,+65]mm · 3 of 40 slices shown]
[im 1/40]
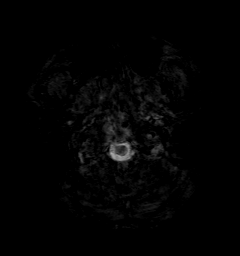
[im 20/40]
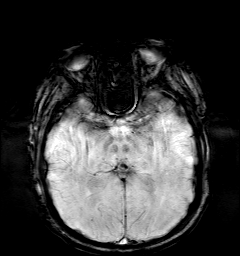
[im 40/40]
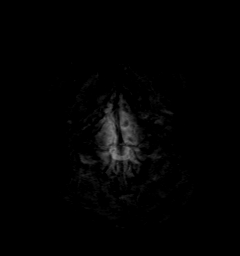

[Series 11: t1_mpr_tra · axial · 1.0mm · 0.71mm/px · z∈[-87,+55]mm · 12 of 144 slices shown]
[im 1/144]
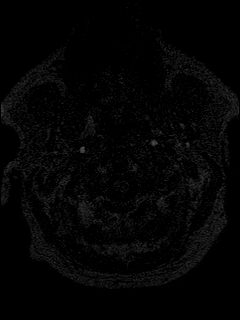
[im 14/144]
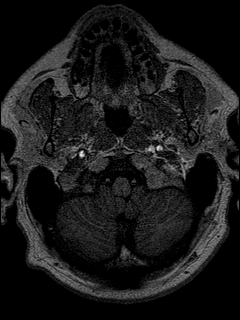
[im 27/144]
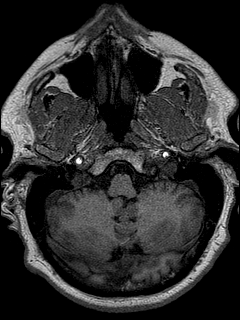
[im 40/144]
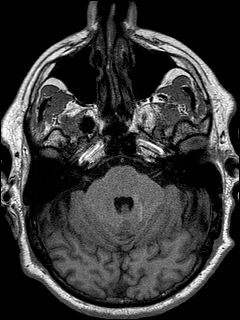
[im 53/144]
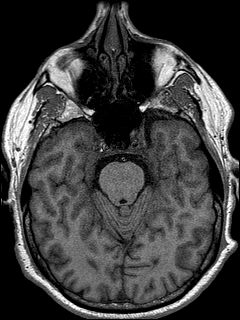
[im 66/144]
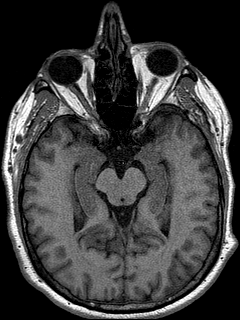
[im 79/144]
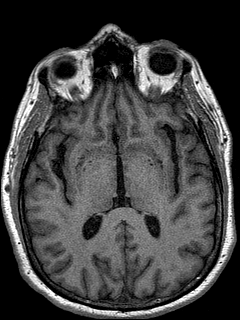
[im 92/144]
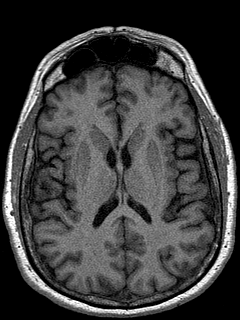
[im 105/144]
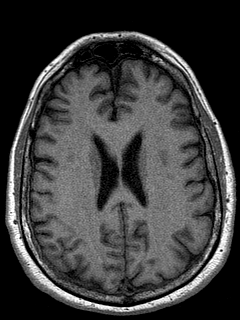
[im 118/144]
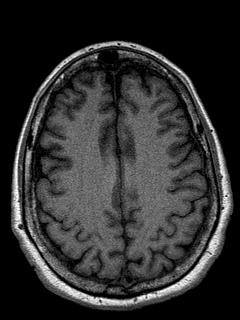
[im 131/144]
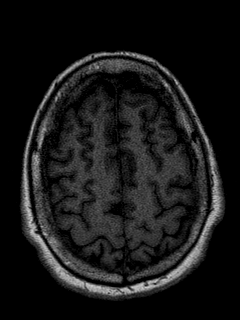
[im 144/144]
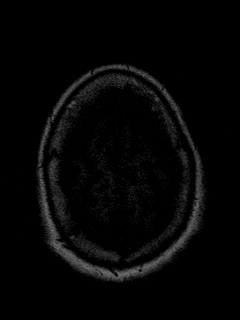

[Series 12: T2 · coronal · 5.0mm · 0.45mm/px · 2 of 27 slices shown (2 of 2)]
[im 1/27]
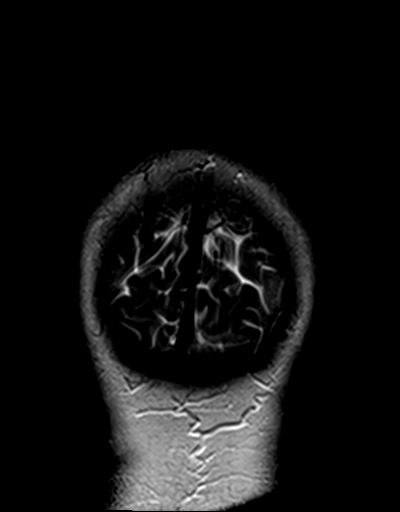
[im 27/27]
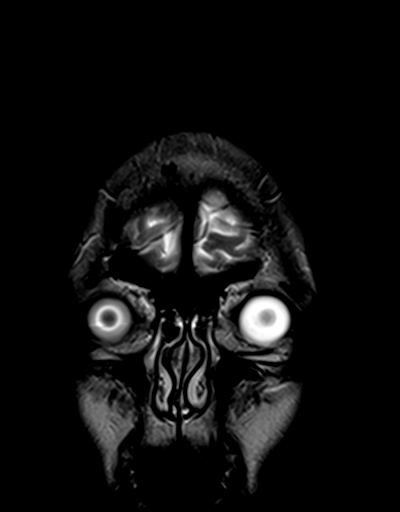

[48 of 48 positions shown; findings below may reference images not displayed]

FINDINGS: Brain:

Mild generalized cerebral and cerebellar atrophy.

No cortical encephalomalacia is identified. No significant cerebral
white matter disease.

There is no acute infarct.

No evidence of an intracranial mass.

No chronic intracranial blood products.

No extra-axial fluid collection.

No midline shift.

Partially empty sella turcica, nonspecific.

Vascular: Maintained flow voids within the proximal large arterial
vessels.

Skull and upper cervical spine: No focal suspicious marrow lesion.

Sinuses/Orbits: Visualized orbits show no acute finding. 2.5 cm left
maxillary sinus mucous retention cyst. Trace mucosal thickening
within the bilateral ethmoid air cells.
IMPRESSION: No evidence of acute intracranial abnormality.

Mild generalized cerebral and cerebellar atrophy.

Paranasal sinus disease. Most notably, a 2.5 cm mucous retention
cyst is present within the left maxillary sinus.

## 2022-11-13 ENCOUNTER — Other Ambulatory Visit: Payer: Self-pay | Admitting: Gastroenterology

## 2022-11-13 ENCOUNTER — Other Ambulatory Visit: Payer: Self-pay | Admitting: Medical

## 2022-11-13 ENCOUNTER — Other Ambulatory Visit: Payer: Self-pay | Admitting: Family Medicine

## 2022-11-13 DIAGNOSIS — M79672 Pain in left foot: Secondary | ICD-10-CM

## 2022-11-14 ENCOUNTER — Encounter: Payer: Self-pay | Admitting: Rehabilitative and Restorative Service Providers"

## 2022-11-14 ENCOUNTER — Ambulatory Visit: Payer: BLUE CROSS/BLUE SHIELD | Admitting: Physical Therapy

## 2022-11-14 DIAGNOSIS — M6281 Muscle weakness (generalized): Secondary | ICD-10-CM

## 2022-11-14 DIAGNOSIS — R293 Abnormal posture: Secondary | ICD-10-CM | POA: Diagnosis not present

## 2022-11-14 DIAGNOSIS — M5459 Other low back pain: Secondary | ICD-10-CM

## 2022-11-14 NOTE — Therapy (Signed)
OUTPATIENT PHYSICAL THERAPY THORACOLUMBAR EVALUATION   Patient Name: ROZELL BEANER MRN: [id number] DOB:1960/09/10, 62 y.o., male Today's Date: 11/14/2022  END OF SESSION:  PT End of Session - 11/14/22 1625     Visit Number 1    Number of Visits 9    Date for PT Re-Evaluation 01/09/23    Authorization Type [employer]    Authorization - Number of Visits 30    Progress Note Due on Visit 9    PT Start Time 1523    PT Stop Time 1611    PT Time Calculation (min) 48 min    Activity Tolerance Patient tolerated treatment well;No increased pain;Patient limited by fatigue    Behavior During Therapy [facility] for tasks assessed/performed             Past Medical History:  Diagnosis Date   Anxiety    Crohn disease (HCC)    pt reports subsequent testing was normal   Depression    GERD (gastroesophageal reflux disease)    Herpes simplex    Hoarding disorder    Hyperlipidemia    Hypertension    IBS (irritable bowel syndrome)    Incontinence    Past Surgical History:  Procedure Laterality Date   APPENDECTOMY     COLONOSCOPY     POLYPECTOMY     TWISTED TESTICLES  1970   [place]    UMBILICAL HERNIA REPAIR     Patient Active Problem List   Diagnosis Date Noted   Mixed hyperlipidemia 09/20/2022   MDD (major depressive disorder), recurrent episode, severe (HCC) 08/31/2021   History of drug overdose 08/30/2021   Tachycardia 06/15/2021   OSA (obstructive sleep apnea) 06/15/2021   Family history of prostate cancer 09/13/2020   Absence of bladder continence 08/30/2020   High risk medication use 08/30/2020   Hypothyroidism 08/30/2020   Incontinence of feces 11/03/2019   OAB (overactive bladder) 10/26/2019   Pancreatic insufficiency 10/26/2019   Spinal stenosis of lumbar region 03/03/2018   Essential hypertension 01/27/2018   OCD (obsessive compulsive disorder) 10/10/2017   Social anxiety disorder 10/10/2017   Seasonal allergies 06/26/2010   History of herpes labialis 06/26/2010    FLATULENCE ERUCTATION AND GAS PAIN 09/23/2007   CROHN'S DISEASE, LARGE AND SMALL INTESTINES 09/22/2007   History of colonic polyps 09/22/2007    PCP: Ronnald Nian, MD  REFERRING PROVIDER: Ronnald Nian, MD  REFERRING DIAG:  Diagnosis  M54.9 (ICD-10-CM) - Upper back pain    Rationale for Evaluation and Treatment: Rehabilitation  THERAPY DIAG:  Abnormal posture  Muscle weakness (generalized)  Other low back pain  ONSET DATE: Gradual onset over a few months  SUBJECTIVE:  SUBJECTIVE STATEMENT: Taishi notes a "back ache" of 2-3 months duration that means he "can't run" or "pick-up things."  Left sciatica as distal as the foot is noted at night.  PERTINENT HISTORY:  HLD, HTN, hypothyroid  PAIN:  Are you having pain? Yes: NPRS scale: 1-3/10 Pain location: Mid and low back, sciatic pain on the left at night Pain description: Ache Aggravating factors: Sleeping Relieving factors: None  PRECAUTIONS: Back  RED FLAGS: None   WEIGHT BEARING RESTRICTIONS: No  FALLS:  Has patient fallen in last 6 months? No  LIVING ENVIRONMENT: Lives with: lives alone Lives in: House/apartment Stairs:  No problems Has following equipment at home: None  OCCUPATION: Receiving clerk full-time, requires heavy lifting, standing most of the day  PLOF: Independent  PATIENT GOALS: Get back to previous level of function  NEXT MD VISIT: As needed  OBJECTIVE:  Note: Objective measures were completed at Evaluation unless otherwise noted.  DIAGNOSTIC FINDINGS:  IMPRESSION: 1. Multilevel lower thoracic and lumbar spine degenerative disc disease, most pronounced at L4-5 and L5-S1. 2. No acute finding by plain radiography.  IMPRESSION: Thoracic spine degenerative changes as above. No acute finding  by plain radiography.  PATIENT SURVEYS:  FOTO 65 (Goal 71, risk adjusted 51) in 9 visits  SCREENING FOR RED FLAGS: Bowel or bladder incontinence: No Spinal tumors: No Cauda equina syndrome: No Compression fracture: No Abdominal aneurysm: No  COGNITION: Overall cognitive status: Within functional limits for tasks assessed     SENSATION: Left leg pain to the foot at night  MUSCLE LENGTH: Hamstrings: Right 40 deg; Left 40 deg  POSTURE: rounded shoulders, forward head, and decreased lumbar lordosis  LUMBAR ROM:   AROM 11/14/2022  Flexion 10  Extension   Right lateral flexion   Left lateral flexion   Right rotation   Left rotation    (Blank rows = not tested)  LOWER EXTREMITY ROM:     Passive  Left/Right 11/14/2022    Hip flexion 95/95   Hip extension    Hip abduction    Hip adduction    Hip internal rotation 16/8   Hip external rotation 43/43   Knee flexion    Knee extension    Ankle dorsiflexion    Ankle plantarflexion    Ankle inversion    Ankle eversion     (Blank rows = not tested)  STRENGTH:    MMT Left/Right 11/14/2022   Hip flexion    Hip extension    Hip abduction    Hip adduction    Hip internal rotation    Hip external rotation    Knee flexion    Knee extension    Ankle dorsiflexion    Ankle plantarflexion    Ankle inversion    Ankle eversion    Spine extension 16 seconds    (Blank rows = not tested)  LUMBAR SPECIAL TESTS:  Prone strength test 16 seconds (Goal 30-60 seconds)  GAIT: Distance walked: 30 feet Assistive device utilized: None Level of assistance: Complete Independence Comments: Significant for forward flexed posture  TODAY'S TREATMENT:  DATE: 11/14/2022 Lumbar extension AROM 10 x 3 seconds Scapular retraction/shoulder blade pinches 10 x 5 seconds Prone alternating hip extension 10 x 3  seconds  Functional Activities: Discussed proper standing posture, spine anatomy with the model and related this to his imaging which we reviewed together.  Discussed correct lifting techniques and demonstrated diagonal squat and golfer's lifts.  Reviewed examination findings and his day 1 home exercise program including a home walking program to be completed on a 3 times a week basis for 20 minutes or more at a time.  Discussed frequency and duration of rehabilitation services recommended.   PATIENT EDUCATION:  Education details: See above Person educated: Patient Education method: Explanation, Demonstration, Tactile cues, Verbal cues, and Handouts Education comprehension: verbalized understanding, returned demonstration, verbal cues required, tactile cues required, and needs further education  HOME EXERCISE PROGRAM: Access Code: [email or web address] URL: [email or web address] Date: 11/14/2022 Prepared by: Pauletta Browns  Exercises - Standing Lumbar Extension at Wall - Forearms  - 5 x daily - 7 x weekly - 1 sets - 5 reps - 3 seconds hold - Standing Scapular Retraction  - 5 x daily - 7 x weekly - 1 sets - 5 reps - 5 second hold - Prone Hip Extension  - 2 x daily - 7 x weekly - 2 sets - 10 reps - 3 seconds hold  ASSESSMENT:  CLINICAL IMPRESSION: Patient is a 62 y.o. male who was seen today for physical therapy evaluation and treatment for  Diagnosis  M54.9 (ICD-10-CM) - Upper back pain  .  Verle has a 2 to 47-month history of mid and low back pain.  He is also noted left leg symptoms as distal as the foot at night.  Jakeob has good overall flexibility and AROM, although he does have poor low back strength, faulty body mechanics and postural abnormalities.  These were addressed with today's examination and home exercise program and will need to be addressed over the next few visits for him to meet long-term goals established below.  Kirolos' prognosis remains good with the recommended  plan of care.  OBJECTIVE IMPAIRMENTS: decreased activity tolerance, decreased endurance, decreased knowledge of condition, decreased ROM, decreased strength, impaired perceived functional ability, improper body mechanics, postural dysfunction, and pain.   ACTIVITY LIMITATIONS: carrying, lifting, bending, standing, sleeping, and bed mobility  PARTICIPATION LIMITATIONS: community activity and occupation  PERSONAL FACTORS: HLD, HTN, hypothyroid are also affecting patient's functional outcome.   REHAB POTENTIAL: Good  CLINICAL DECISION MAKING: Stable/uncomplicated  EVALUATION COMPLEXITY: Low   GOALS: Goals reviewed with patient? Yes  SHORT TERM GOALS: Target date: 12/12/2022  Kilo will be independent with his day 1 HEP Baseline: Started 11/14/2022 Goal status: INITIAL  2.  Glory will be able to maintain the prone spine strength test position for 30 seconds Baseline: 16 seconds Goal status: INITIAL  3.  Zao will be able to demonstrate appropriate lift techniques as required for his work Baseline: Admittedly poor body mechanics at evaluation Goal status: INITIAL   LONG TERM GOALS: Target date: 01/09/2023  Improve FOTO to 71 in 9 visits Baseline: Risk adjusted 51, 65 Goal status: INITIAL  2.  Charleton will report back pain consistently 0-1/10 on the Numeric Pain Rating Scale Baseline: 1-3/10 Goal status: INITIAL  3.  Hiram will be able to hold the spine strength test position for 45 to 60 seconds Baseline: 16 seconds Goal status: INITIAL  4.  Leonel will be able to perform his normal work activities without increasing back pain above the  level of 1/10 Baseline: Limited with work with 3/10 pain Goal status: INITIAL  5.  Niket will be independent with his long-term maintenance HEP at DC Baseline: Started 11/14/2022 Goal status: INITIAL  PLAN:  PT FREQUENCY: 1x/week  PT DURATION: 8 weeks  PLANNED INTERVENTIONS: 97110-Therapeutic exercises, 97530-  Therapeutic activity, [id number]- Neuromuscular re-education, 97535- Self Care, [id number]- Manual therapy, 97012- Traction (mechanical), Patient/Family education, Dry Needling, Spinal mobilization, Cryotherapy, and Moist heat.  PLAN FOR NEXT SESSION: Review day 1 home exercise program.  Heavy emphasis on low back strengthening, posture and body mechanics as Chigozie moves very well and has good flexibility.   Cherlyn Cushing, PT, MPT 11/14/2022, 4:38 PM

## 2022-11-15 NOTE — Telephone Encounter (Signed)
Called pt, no answer, left voicemail.

## 2022-11-16 ENCOUNTER — Ambulatory Visit: Payer: BC Managed Care – PPO | Admitting: Gastroenterology

## 2022-11-16 ENCOUNTER — Encounter: Payer: Self-pay | Admitting: Gastroenterology

## 2022-11-16 VITALS — BP 112/72 | HR 76 | Ht 75.0 in | Wt 233.0 lb

## 2022-11-16 DIAGNOSIS — K8689 Other specified diseases of pancreas: Secondary | ICD-10-CM | POA: Diagnosis not present

## 2022-11-16 DIAGNOSIS — G4733 Obstructive sleep apnea (adult) (pediatric): Secondary | ICD-10-CM | POA: Diagnosis not present

## 2022-11-16 DIAGNOSIS — K5080 Crohn's disease of both small and large intestine without complications: Secondary | ICD-10-CM

## 2022-11-16 DIAGNOSIS — R197 Diarrhea, unspecified: Secondary | ICD-10-CM | POA: Insufficient documentation

## 2022-11-16 MED ORDER — BUDESONIDE 3 MG PO CPEP: 9.0000 mg | ORAL_CAPSULE | Freq: Every day | ORAL | 3 refills | Status: DC

## 2022-11-16 NOTE — Progress Notes (Signed)
11/16/2022 Zachary [name] [id number] August 16, 1960   HISTORY OF PRESENT ILLNESS: This is a 62 year old male who is a patient of Dr. Ardell Isaacs.  He has Crohn's ileocolitis and pancreatic insufficiency.  He had been on Apriso in the past but that was discontinued due to concern is exacerbating his diarrhea.  He has remained on budesonide 9 mg daily as his preference.  He actually discontinued that after his last visit with Dr. Russella Dar as he thought it was causing him some fecal incontinence.  He saw pelvic floor physical therapy and they recommended some psyllium so he has been taking that which has helped to bulk his stool.  He would like to restart the budesonide.  He is not having any problems consistent with his Crohn's right now, but wants to resume it to avoid any issues.  He is not having any abdominal pain or rectal bleeding.  He is having 1 or 2 bowel movements a day.  Uses Imodium as needed.  He continues on his Creon.  Colonoscopy 01/2020:  - A few erosions in the terminal ileum. Biopsied. - Two ulcers at the ileocecal valve. Biopsied. - Sessile sigmoid colon polyp. Removed and retrieved. - The examination was otherwise normal on direct and retroflexion views. Biopsied.  1. Surgical [P], small bowel, terminal ileum - MILDLY ACTIVE CHRONIC COLITIS. - NEGATIVE FOR GRANULOMATA AND DYSPLASIA. 2. Surgical [P], small bowel, ileocecal valve - MILDLY ACTIVE CHRONIC COLITIS. - NEGATIVE FOR GRANULOMATA AND DYSPLASIA. 3. Surgical [P], random normal colon - COLONIC MUCOSA WITH NO SIGNIFICANT PATHOLOGIC FINDINGS. - NEGATIVE FOR ACTIVE INFLAMMATION AND OTHER ABNORMALITIES. 4. Surgical [P], colon, sigmoid, polyp - HYPERPLASTIC POLYP.  Past Medical History:  Diagnosis Date   Anxiety    Crohn disease (HCC)    pt reports subsequent testing was normal   Depression    GERD (gastroesophageal reflux disease)    Herpes simplex    Hoarding disorder    Hyperlipidemia    Hypertension    IBS  (irritable bowel syndrome)    Incontinence    Past Surgical History:  Procedure Laterality Date   APPENDECTOMY     COLONOSCOPY     POLYPECTOMY     TWISTED TESTICLES  1970   [place]    UMBILICAL HERNIA REPAIR      reports that he quit smoking about 44 years ago. His smoking use included cigarettes. He has never used smokeless tobacco. He reports that he does not drink alcohol and does not use drugs. family history includes Arthritis in his mother; Breast cancer in his maternal grandmother; Cancer in his maternal grandfather and paternal grandmother; Depression in his mother; Hypertension in his father; Lung cancer in his maternal aunt; Prostate cancer in his paternal uncle and paternal uncle; Prostate cancer (age of onset: 23) in his father; Stroke in his mother. Allergies  Allergen Reactions   Erythromycin Other (See Comments)    Patient said he is "allergic," but the reaction was not cited   Penicillins Other (See Comments)    "It may kill me"   Tetracyclines & Related Hives      Outpatient Encounter Medications as of 11/16/2022  Medication Sig   aspirin 81 MG EC tablet Take 81 mg by mouth daily. Swallow whole.   budesonide (ENTOCORT EC) 3 MG 24 hr capsule TAKE 3 CAPSULES BY MOUTH EVERY DAY   busPIRone (BUSPAR) 30 MG tablet TAKE 1 TABLET BY MOUTH 2 TIMES DAILY.   CREON 36000-114000 units CPEP capsule TAKE 4 CAPSULES  BY MOUTH BEFORE A MEAL AND 2 CAPSULES WITH EACH SNACK   donepezil (ARICEPT) 10 MG tablet Take 1 tablet (10 mg total) by mouth at bedtime.   fluvoxaMINE (LUVOX) 100 MG tablet TAKE 4 TABLETS(400 MG) BY MOUTH AT BEDTIME   lamoTRIgine (LAMICTAL) 200 MG tablet TAKE 1 TABLET(200 MG) BY MOUTH DAILY   levothyroxine (SYNTHROID) 75 MCG tablet TAKE 1 TABLET BY MOUTH ONCE DAILY   loperamide (IMODIUM) 2 MG capsule Take 1 capsule (2 mg total) by mouth every 4 (four) hours. As needed   losartan (COZAAR) 100 MG tablet Take 1 tablet (100 mg total) by mouth daily.   meloxicam (MOBIC)  15 MG tablet TAKE 1 TABLET (15 MG TOTAL) BY MOUTH DAILY. TAKE UNTIL YOUR FOOT PAIN HAS COMPLETELY RESOLVED. TAKE WITH FOOD.   OLANZapine (ZYPREXA) 15 MG tablet Take 1 tablet (15 mg total) by mouth at bedtime.   psyllium (REGULOID) 0.52 g capsule Take 0.52 g by mouth daily.   sodium chloride 1 g tablet TAKE 1 TABLET(1 GRAM) BY MOUTH DAILY   No facility-administered encounter medications on file as of 11/16/2022.    REVIEW OF SYSTEMS  : All other systems reviewed and negative except where noted in the History of Present Illness.   PHYSICAL EXAM: BP 112/72   Pulse 76   Ht 6\' 3"  (1.905 m)   Wt 233 lb (105.7 kg)   BMI 29.12 kg/m  General: Well developed white male in no acute distress Head: Normocephalic and atraumatic Eyes:  Sclerae anicteric, conjunctiva pink. Ears: Normal auditory acuity Lungs: Clear throughout to auscultation; no W/R/R. Heart: Regular rate and rhythm; no M/R/G. Abdomen: Soft, non-distended.  BS present.  Non-tender. Musculoskeletal: Symmetrical with no gross deformities  Skin: No lesions on visible extremities Neurological: Alert oriented x 4, grossly non-focal Psychological:  Alert and cooperative. Normal mood and affect  ASSESSMENT AND PLAN: Pancreatic insufficiency Crohn's ileocolitis, mild  Fecal incontinence   -Continue Creon as prescribed and fat modified diet. -Continue budesonide 9 mg every day.  He had stopped it after his visit with Dr. Russella Dar in January but wants to resume it.  Prescription sent to pharmacy. -Continue Imodium 1-2 q4h prn - adjust dosing to control diarrhea without constipation. -Continue psyllium which has helped to bulk his stool at the recommendation of pelvic floor PT.  CC:  Ronnald Nian, MD

## 2022-11-16 NOTE — Patient Instructions (Signed)
We have sent the following medications to your pharmacy for you to pick up at your convenience: Budesonide 9 mg daily.   _______________________________________________________  If your blood pressure at your visit was 140/90 or greater, please contact your primary care physician to follow up on this.  _______________________________________________________  If you are age 60 or older, your body mass index should be between 23-30. Your Body mass index is 29.12 kg/m. If this is out of the aforementioned range listed, please consider follow up with your Primary Care Provider.  If you are age 70 or younger, your body mass index should be between 19-25. Your Body mass index is 29.12 kg/m. If this is out of the aformentioned range listed, please consider follow up with your Primary Care Provider.   ________________________________________________________  The [facility] providers would like to encourage you to use [facility] to communicate with providers for non-urgent requests or questions.  Due to long hold times on the telephone, sending your provider a message by [facility] may be a faster and more efficient way to get a response.  Please allow 48 business hours for a response.  Please remember that this is for non-urgent requests.  _______________________________________________________

## 2022-11-19 NOTE — Telephone Encounter (Signed)
Called pt, no answer, left voicemail.

## 2022-11-27 ENCOUNTER — Other Ambulatory Visit: Payer: Self-pay | Admitting: Psychiatry

## 2022-11-27 ENCOUNTER — Other Ambulatory Visit: Payer: Self-pay | Admitting: Family Medicine

## 2022-11-27 ENCOUNTER — Other Ambulatory Visit: Payer: Self-pay | Admitting: Gastroenterology

## 2022-11-27 DIAGNOSIS — F422 Mixed obsessional thoughts and acts: Secondary | ICD-10-CM

## 2022-11-27 DIAGNOSIS — F331 Major depressive disorder, recurrent, moderate: Secondary | ICD-10-CM

## 2022-11-27 DIAGNOSIS — M79672 Pain in left foot: Secondary | ICD-10-CM

## 2022-11-27 DIAGNOSIS — F4010 Social phobia, unspecified: Secondary | ICD-10-CM

## 2022-11-28 NOTE — Telephone Encounter (Signed)
Called pt, no answer, left voicemail.

## 2022-12-12 ENCOUNTER — Encounter: Payer: BC Managed Care – PPO | Admitting: Rehabilitative and Restorative Service Providers"

## 2022-12-14 ENCOUNTER — Other Ambulatory Visit: Payer: Self-pay | Admitting: Medical

## 2022-12-14 ENCOUNTER — Other Ambulatory Visit: Payer: Self-pay | Admitting: Psychiatry

## 2022-12-14 ENCOUNTER — Other Ambulatory Visit: Payer: Self-pay | Admitting: Family Medicine

## 2022-12-14 ENCOUNTER — Other Ambulatory Visit: Payer: Self-pay | Admitting: Gastroenterology

## 2022-12-14 DIAGNOSIS — M79672 Pain in left foot: Secondary | ICD-10-CM

## 2022-12-14 DIAGNOSIS — F331 Major depressive disorder, recurrent, moderate: Secondary | ICD-10-CM

## 2022-12-14 NOTE — Telephone Encounter (Signed)
This was discontinued back in May

## 2022-12-21 ENCOUNTER — Encounter: Payer: BC Managed Care – PPO | Admitting: Rehabilitative and Restorative Service Providers"

## 2022-12-28 ENCOUNTER — Encounter: Payer: BC Managed Care – PPO | Admitting: Rehabilitative and Restorative Service Providers"

## 2022-12-30 ENCOUNTER — Other Ambulatory Visit: Payer: Self-pay | Admitting: Psychiatry

## 2022-12-30 ENCOUNTER — Other Ambulatory Visit: Payer: Self-pay | Admitting: Medical

## 2022-12-30 DIAGNOSIS — F4010 Social phobia, unspecified: Secondary | ICD-10-CM

## 2022-12-30 DIAGNOSIS — F422 Mixed obsessional thoughts and acts: Secondary | ICD-10-CM

## 2022-12-30 DIAGNOSIS — F331 Major depressive disorder, recurrent, moderate: Secondary | ICD-10-CM

## 2022-12-31 ENCOUNTER — Encounter: Payer: Self-pay | Admitting: Psychiatry

## 2022-12-31 ENCOUNTER — Ambulatory Visit: Payer: BC Managed Care – PPO | Admitting: Psychiatry

## 2022-12-31 DIAGNOSIS — F422 Mixed obsessional thoughts and acts: Secondary | ICD-10-CM

## 2022-12-31 DIAGNOSIS — G4733 Obstructive sleep apnea (adult) (pediatric): Secondary | ICD-10-CM | POA: Diagnosis not present

## 2022-12-31 DIAGNOSIS — F423 Hoarding disorder: Secondary | ICD-10-CM

## 2022-12-31 DIAGNOSIS — F331 Major depressive disorder, recurrent, moderate: Secondary | ICD-10-CM | POA: Diagnosis not present

## 2022-12-31 DIAGNOSIS — F5105 Insomnia due to other mental disorder: Secondary | ICD-10-CM

## 2022-12-31 DIAGNOSIS — F4010 Social phobia, unspecified: Secondary | ICD-10-CM | POA: Diagnosis not present

## 2022-12-31 DIAGNOSIS — G3184 Mild cognitive impairment, so stated: Secondary | ICD-10-CM

## 2022-12-31 NOTE — Telephone Encounter (Signed)
Has appt today
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
## 2022-12-31 NOTE — Patient Instructions (Signed)
  [place] MUDstache Unscented Mustache Wax - No Pull - Spreads Easy for a Strong Non-Tacky 24 hr Hold - Tame, Train and Style - Moustache Wax for Men (1oz)

## 2022-12-31 NOTE — Telephone Encounter (Signed)
This was discontinued over 6 months ago.

## 2023-01-04 ENCOUNTER — Encounter: Payer: Self-pay | Admitting: Rehabilitative and Restorative Service Providers"

## 2023-01-04 ENCOUNTER — Ambulatory Visit: Payer: BC Managed Care – PPO | Admitting: Rehabilitative and Restorative Service Providers"

## 2023-01-04 DIAGNOSIS — M6281 Muscle weakness (generalized): Secondary | ICD-10-CM

## 2023-01-04 DIAGNOSIS — R293 Abnormal posture: Secondary | ICD-10-CM

## 2023-01-04 DIAGNOSIS — M5459 Other low back pain: Secondary | ICD-10-CM | POA: Diagnosis not present

## 2023-01-04 NOTE — Therapy (Signed)
 OUTPATIENT PHYSICAL THERAPY THORACOLUMBAR TREATMENT/PROGRESS NOTE  Progress Note Reporting Period 11/14/2022 to 01/04/2023  See note below for Objective Data and Assessment of Progress/Goals.     Patient Name: Zachary [name] MRN: [id number] DOB:05-31-1960, 63 y.o., male Today's Date: 01/04/2023  END OF SESSION:  PT End of Session - 01/04/23 1619     Visit Number 2    Number of Visits 9    Date for PT Re-Evaluation 01/09/23    Authorization Type [employer]    Authorization - Number of Visits 30    Progress Note Due on Visit 9    PT Start Time 1516    PT Stop Time 1600    PT Time Calculation (min) 44 min    Activity Tolerance Patient tolerated treatment well;No increased pain    Behavior During Therapy WFL for tasks assessed/performed              Past Medical History:  Diagnosis Date   Anxiety    Crohn disease (HCC)    pt reports subsequent testing was normal   Depression    GERD (gastroesophageal reflux disease)    Herpes simplex    Hoarding disorder    Hyperlipidemia    Hypertension    IBS (irritable bowel syndrome)    Incontinence    Past Surgical History:  Procedure Laterality Date   APPENDECTOMY     COLONOSCOPY     POLYPECTOMY     TWISTED TESTICLES  1970   [place]    UMBILICAL HERNIA REPAIR     Patient Active Problem List   Diagnosis Date Noted   Diarrhea 11/16/2022   Mixed hyperlipidemia 09/20/2022   MDD (major depressive disorder), recurrent episode, severe (HCC) 08/31/2021   History of drug overdose 08/30/2021   Tachycardia 06/15/2021   OSA (obstructive sleep apnea) 06/15/2021   Family history of prostate cancer 09/13/2020   Absence of bladder continence 08/30/2020   High risk medication use 08/30/2020   Hypothyroidism 08/30/2020   Incontinence of feces 11/03/2019   OAB (overactive bladder) 10/26/2019   Pancreatic insufficiency 10/26/2019   Spinal stenosis of lumbar region 03/03/2018   Essential hypertension 01/27/2018   OCD (obsessive  compulsive disorder) 10/10/2017   Social anxiety disorder 10/10/2017   Seasonal allergies 06/26/2010   History of herpes labialis 06/26/2010   FLATULENCE ERUCTATION AND GAS PAIN 09/23/2007   CROHN'S DISEASE, LARGE AND SMALL INTESTINES 09/22/2007   History of colonic polyps 09/22/2007    PCP: Norleen JAYSON Jobs, MD  REFERRING PROVIDER: Norleen JAYSON Jobs, MD  REFERRING DIAG:  Diagnosis  M54.9 (ICD-10-CM) - Upper back pain    Rationale for Evaluation and Treatment: Rehabilitation  THERAPY DIAG:  Abnormal posture - Plan: PT plan of care cert/re-cert  Muscle weakness (generalized) - Plan: PT plan of care cert/re-cert  Other low back pain - Plan: PT plan of care cert/re-cert  ONSET DATE: Gradual onset over a few months  SUBJECTIVE:  SUBJECTIVE STATEMENT: Zachary [name] notes sciatica is improved significantly since evaluation and is no longer daily.  He notes better body mechanics but would like to review this as he lifts packages all day at work.  Zachary [name] notes a back ache of 2-3 months duration that means he can't run or pick-up things.  Left sciatica as distal as the foot is noted at night.  PERTINENT HISTORY:  HLD, HTN, hypothyroid  PAIN:  Are you having pain? Yes: NPRS scale: 0-0.5/10 (was 1-3/10) on a 10/10 Pain location: Mid and low back, 1 time sciatic pain on the left at night over the past week (was every night) Pain description: Ache Aggravating factors: Sleeping Relieving factors: None  PRECAUTIONS: Back  RED FLAGS: None   WEIGHT BEARING RESTRICTIONS: No  FALLS:  Has patient fallen in last 6 months? No  LIVING ENVIRONMENT: Lives with: lives alone Lives in: House/apartment Stairs:  No problems Has following equipment at home: None  OCCUPATION: Receiving clerk full-time,  requires heavy lifting, standing most of the day  PLOF: Independent  PATIENT GOALS: Get back to previous level of function  NEXT MD VISIT: As needed  OBJECTIVE:  Note: Objective measures were completed at Evaluation unless otherwise noted.  DIAGNOSTIC FINDINGS:  IMPRESSION: 1. Multilevel lower thoracic and lumbar spine degenerative disc disease, most pronounced at L4-5 and L5-S1. 2. No acute finding by plain radiography.  IMPRESSION: Thoracic spine degenerative changes as above. No acute finding by plain radiography.  PATIENT SURVEYS:  01/04/2023 FOTO 88 (Goal met)  Eval: FOTO 65 (Goal 71, risk adjusted 51) in 9 visits  SCREENING FOR RED FLAGS: Bowel or bladder incontinence: No Spinal tumors: No Cauda equina syndrome: No Compression fracture: No Abdominal aneurysm: No  COGNITION: Overall cognitive status: Within functional limits for tasks assessed     SENSATION: Left leg pain to the foot at night  MUSCLE LENGTH: Hamstrings: Right 40 deg; Left 40 deg  POSTURE: rounded shoulders, forward head, and decreased lumbar lordosis  LUMBAR ROM:   AROM 11/14/2022 01/04/2023  Flexion 10 15  Extension    Right lateral flexion    Left lateral flexion    Right rotation    Left rotation     (Blank rows = not tested)  LOWER EXTREMITY ROM:     Passive  Left/Right 11/14/2022    Hip flexion 95/95   Hip extension    Hip abduction    Hip adduction    Hip internal rotation 16/8   Hip external rotation 43/43   Knee flexion    Knee extension    Ankle dorsiflexion    Ankle plantarflexion    Ankle inversion    Ankle eversion     (Blank rows = not tested)  STRENGTH:    MMT Left/Right 11/14/2022   Hip flexion    Hip extension    Hip abduction    Hip adduction    Hip internal rotation    Hip external rotation    Knee flexion    Knee extension    Ankle dorsiflexion    Ankle plantarflexion    Ankle inversion    Ankle eversion    Spine extension 16 seconds     (Blank rows = not tested)  LUMBAR SPECIAL TESTS:  Prone strength test 16 seconds (Goal 30-60 seconds)  GAIT: Distance walked: 30 feet Assistive device utilized: None Level of assistance: Complete Independence Comments: Significant for forward flexed posture  TODAY'S TREATMENT:  DATE:  01/04/2023 Lumbar extension AROM 10 x 3 seconds Scapular retraction/shoulder blade pinches 10 x 5 seconds Hip hike in door way 2 sets of 10 for 3 seconds Yoga Bridge 10 x 5 seconds  Functional Activities: 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  Reviewed exam findings and HEP with progressions to low back strength.   11/14/2022 Lumbar extension AROM 10 x 3 seconds Scapular retraction/shoulder blade pinches 10 x 5 seconds Prone alternating hip extension 10 x 3 seconds  Functional Activities: Discussed proper standing posture, spine anatomy with the model and related this to his imaging which we reviewed together.  Discussed correct lifting techniques and demonstrated diagonal squat and golfer's lifts.  Reviewed examination findings and his day 1 home exercise program including a home walking program to be completed on a 3 times a week basis for 20 minutes or more at a time.  Discussed frequency and duration of rehabilitation services recommended.   PATIENT EDUCATION:  Education details: See above Person educated: Patient Education method: Explanation, Demonstration, Tactile cues, Verbal cues, and Handouts Education comprehension: verbalized understanding, returned demonstration, verbal cues required, tactile cues required, and needs further education  HOME EXERCISE PROGRAM: Access Code: [email or web address] URL: [email or web address] Date: 01/04/2023 Prepared by: Lamar Ivory  Exercises - Standing Lumbar Extension  at Wall - Forearms  - 5 x daily - 7 x weekly - 1 sets - 5 reps - 3 seconds hold - Standing Scapular Retraction  - 5 x daily - 7 x weekly - 1 sets - 5 reps - 5 second hold - Prone Hip Extension  - 2 x daily - 1 x weekly - 2 sets - 10 reps - 3 seconds hold - Standing Hip Hiking  - 3 x daily - 3 x weekly - 1 sets - 10 reps - 3 seconds hold - Sit to Stand with Armchair  - 3 x weekly - 1 sets - 10 reps - Supine Bridge  - 1 x daily - 3-7 x weekly - 1 sets - 10 reps - 5 second hold   ASSESSMENT:  CLINICAL IMPRESSION:  Zachary [name] notes significant improvement in his sciatic symptoms since evaluation.  He notes only 1 episode of sciatic pain at night and it was much more mild than at evaluation.  Zachary [name] also noted difficulty with his prone strengthening due to cramping so we reviewed some other alternatives to work on back strength.  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  Zachary [name] was given the option of independent rehabilitation given his significant progress with mostly independent rehabilitation.  Zachary [name] has decided he would like to attend 1 additional visit to see how he does with the new exercises and possibly reviewed body mechanics again if needed.  Patient is a 63 y.o. male who was seen today for physical therapy evaluation and treatment for  Diagnosis  M54.9 (ICD-10-CM) - Upper back pain  .  Zachary [name] has a 2 to 108-month history of mid and low back pain.  He is also noted left leg symptoms as distal as the foot at night.  Zachary [name] has good overall flexibility and AROM, although he does have poor low back strength, faulty body mechanics and postural abnormalities.  These were addressed with today's examination and home exercise program and will need to be addressed over the next few visits for him to meet long-term goals established below.  Zachary [name]' prognosis remains  good with the recommended plan of care.  OBJECTIVE IMPAIRMENTS:  decreased activity tolerance, decreased endurance, decreased knowledge of condition, decreased ROM, decreased strength, impaired perceived functional ability, improper body mechanics, postural dysfunction, and pain.   ACTIVITY LIMITATIONS: carrying, lifting, bending, standing, sleeping, and bed mobility  PARTICIPATION LIMITATIONS: community activity and occupation  PERSONAL FACTORS: HLD, HTN, hypothyroid are also affecting patient's functional outcome.   REHAB POTENTIAL: Good  CLINICAL DECISION MAKING: Stable/uncomplicated  EVALUATION COMPLEXITY: Low   GOALS: Goals reviewed with patient? Yes  SHORT TERM GOALS: Target date: 12/12/2022  Zachary [name] will be independent with his day 1 HEP Baseline: Started 11/14/2022 Goal status: Met 01/04/2023  2.  Zachary [name] will be able to maintain the prone spine strength test position for 30 seconds Baseline: 16 seconds Goal status: INITIAL  3.  Zachary [name] will be able to demonstrate appropriate lift techniques as required for his work Baseline: Admittedly poor body mechanics at evaluation Goal status: On Going 01/04/2023   LONG TERM GOALS: Target date: 03/01/2023  Improve FOTO to 71 in 9 visits Baseline: Risk adjusted 51, 65 Goal status: Met 01/04/2023  2.  Zachary [name] will report back pain consistently 0-1/10 on the Numeric Pain Rating Scale Baseline: 1-3/10 Goal status: Met 01/04/2023  3.  Zachary [name] will be able to hold the spine strength test position for 45 to 60 seconds Baseline: 16 seconds Goal status: INITIAL  4.  Zachary [name] will be able to perform his normal work activities without increasing back pain above the level of 1/10 Baseline: Limited with work with 3/10 pain Goal status: Met 01/04/2023  5.  Zachary [name] will be independent with his long-term maintenance HEP at DC Baseline: Started 11/14/2022 Goal status: On Going 01/04/2023  PLAN:  PT FREQUENCY: Up to 6 additional visits (1-2 additional visits likely)  PT DURATION: 8 weeks  PLANNED  INTERVENTIONS: 97110-Therapeutic exercises, 97530- Therapeutic activity, 97112- Neuromuscular re-education, 97535- Self Care, 02859- Manual therapy, 97012- Traction (mechanical), Patient/Family education, Dry Needling, Spinal mobilization, Cryotherapy, and Moist heat.  PLAN FOR NEXT SESSION: Review changes to his home exercise program.  Heavy emphasis on low back strengthening, posture and body mechanics as [employer] lifts packages all day at work.   Myer LELON Ivory, PT, MPT 01/04/2023, 4:31 PM

## 2023-01-07 ENCOUNTER — Other Ambulatory Visit: Payer: Self-pay | Admitting: Family Medicine

## 2023-01-07 DIAGNOSIS — M79672 Pain in left foot: Secondary | ICD-10-CM

## 2023-01-10 ENCOUNTER — Encounter: Payer: BC Managed Care – PPO | Admitting: Rehabilitative and Restorative Service Providers"

## 2023-01-16 ENCOUNTER — Encounter: Payer: BC Managed Care – PPO | Admitting: Rehabilitative and Restorative Service Providers"

## 2023-01-16 ENCOUNTER — Other Ambulatory Visit: Payer: BC Managed Care – PPO

## 2023-01-16 ENCOUNTER — Telehealth: Payer: BC Managed Care – PPO | Admitting: Medical

## 2023-01-16 VITALS — Wt 230.0 lb

## 2023-01-16 DIAGNOSIS — R0989 Other specified symptoms and signs involving the circulatory and respiratory systems: Secondary | ICD-10-CM | POA: Diagnosis not present

## 2023-01-16 DIAGNOSIS — R051 Acute cough: Secondary | ICD-10-CM

## 2023-01-16 DIAGNOSIS — U071 COVID-19: Secondary | ICD-10-CM | POA: Diagnosis not present

## 2023-01-16 DIAGNOSIS — R630 Anorexia: Secondary | ICD-10-CM | POA: Diagnosis not present

## 2023-01-16 LAB — POCT INFLUENZA A/B
Influenza A, POC: NEGATIVE
Influenza B, POC: NEGATIVE

## 2023-01-16 LAB — POC COVID19 BINAXNOW: SARS Coronavirus 2 Ag: POSITIVE — AB

## 2023-01-16 MED ORDER — NIRMATRELVIR&RITONAVIR 150/100 10 X 150 MG & 10 X 100MG PO TBPK: 2.0000 | ORAL_TABLET | Freq: Two times a day (BID) | ORAL | 0 refills | Status: AC

## 2023-01-16 NOTE — Progress Notes (Addendum)
 Subjective:     Patient ID: Zachary [name], male   DOB: 02-28-1960, 63 y.o.   MRN: [id number]  This visit type was conducted due to national recommendations for restrictions regarding the COVID-19 Pandemic (e.g. social distancing) in an effort to limit this patient's exposure and mitigate transmission in our community.  Due to their co-morbid illnesses, this patient is at least at moderate risk for complications without adequate follow up.  This format is felt to be most appropriate for this patient at this time.    Documentation for virtual audio and video telecommunications through [facility] encounter:  The patient was located at home. The provider was located in the office. The patient did consent to this visit and is aware of possible charges through their insurance for this visit.  The other persons participating in this telemedicine service were none. Time spent on call was 20 minutes and in review of previous records 20 minutes total.  This virtual service is not related to other E/M service within previous 7 days.   HPI Chief Complaint  Patient presents with   Covid Positive    Positive covid. Symptoms started Sunday with no appetite, cough- colored mucous, runny nose, not sleeping good, chest hurts when coughing   Virtual consult for not feeling well.  Symptoms started 3 days ago.  Symptoms include not feeling good, cough, colored mucus, chills, mild sore throat, runny nose, sleep impairment, appetite impairment.  Lower chest hurts with cough.  No sob or wheezing.  Mild headaches.   Using some tylenol .  No other aggravating or relieving factors. No other complaint.   Past Medical History:  Diagnosis Date   Anxiety    Crohn disease (HCC)    pt reports subsequent testing was normal   Depression    GERD (gastroesophageal reflux disease)    Herpes simplex    Hoarding disorder    Hyperlipidemia    Hypertension    IBS (irritable bowel syndrome)    Incontinence     Current Outpatient Medications on File Prior to Visit  Medication Sig Dispense Refill   aspirin  81 MG EC tablet Take 81 mg by mouth daily. Swallow whole.     budesonide  (ENTOCORT EC ) 3 MG 24 hr capsule Take 3 capsules (9 mg total) by mouth daily. 270 capsule 3   busPIRone  (BUSPAR ) 30 MG tablet TAKE 1 TABLET BY MOUTH 2 TIMES DAILY. 180 tablet 2   CREON  36000-114000 units CPEP capsule TAKE 4 CAPSULES BY MOUTH BEFORE A MEAL AND 2 CAPSULES WITH EACH SNACK 500 capsule 2   dicyclomine  (BENTYL ) 10 MG capsule TAKE 1 CAPSULE BY MOUTH 3 TIMES DAILY AS NEEDED FOR ABDOMINAL PAIN OR CRAMPING 270 capsule 0   donepezil  (ARICEPT ) 10 MG tablet Take 1 tablet (10 mg total) by mouth at bedtime. 90 tablet 3   emtricitabine -tenofovir  (TRUVADA) 200-300 MG tablet TAKE 1 TABLET BY MOUTH EVERY DAY 30 tablet 3   fluvoxaMINE  (LUVOX ) 100 MG tablet TAKE 4 TABLETS(400 MG) BY MOUTH AT BEDTIME 360 tablet 1   lamoTRIgine  (LAMICTAL ) 200 MG tablet TAKE 1 TABLET(200 MG) BY MOUTH DAILY 30 tablet 0   levothyroxine  (SYNTHROID ) 75 MCG tablet TAKE 1 TABLET BY MOUTH ONCE DAILY 90 tablet 3   loperamide  (IMODIUM ) 2 MG capsule Take 1 capsule (2 mg total) by mouth every 4 (four) hours. As needed 60 capsule 2   losartan  (COZAAR ) 100 MG tablet Take 1 tablet (100 mg total) by mouth daily. 90 tablet 3   OLANZapine  (ZYPREXA ) 15  MG tablet TAKE 1 TABLET BY MOUTH EVERYDAY AT BEDTIME 90 tablet 0   sodium chloride  1 g tablet TAKE 1 TABLET(1 GRAM) BY MOUTH DAILY 90 tablet 0   psyllium (REGULOID) 0.52 g capsule Take 0.52 g by mouth daily.     No current facility-administered medications on file prior to visit.    Review of Systems As in subjective      Objective:   Physical Exam Due to coronavirus pandemic stay at home measures, patient visit was virtual and they were not examined in person.   Wt 230 lb (104.3 kg)   BMI 28.75 kg/m   General: Well-developed well-nourished no acute distress, ill-appearing No labored breathing or  wheezing      Assessment:     Encounter Diagnoses  Name Primary?   COVID Yes   Acute cough    Appetite impaired        Plan:      Covid infection, covid illness general recommendations:  I recommend you rest, hydrate well with water and clear fluids throughout the day.  If you feel dry in the mouth, tongue or feel that you are urinating as much as usual, then increase hydration.  You urine should be like yellow to clear, not dark yellow or darker.     Pain, body aches, or fever: You can use Tylenol  /Acetaminophen  325mg  over the counter for pain or fever, every 4-6 hours   Cough and congestion: You can use over the counter Mucinex DM or Coricidin HBP for cough and congestion   Nausea: You can use over the counter Emetrol for nausea.     Antiviral medication: Consider  medication Paxlovid  to help reduce your risk of hospitalization or severe illness.  If medicaiton is not available, too expensive or not covered by insurance, then call us  back.  If you do start the medicine we will use a lower dose and given interactions with your other medications.  Cut back on your losartan  to half tablet daily for the next 3 days given potential for dehydration and kidney injury   Other supportive measures: I recommend extra vitamins to help your body fight the illness.   Consider over the counter vitamin pack such as EmergenC Immune Plus which contains extra vitamin C, vitamin D and zinc .     In the next few days, if you are having trouble breathing, if you are very weak, have high fever 103 or higher consistently despite Tylenol , or uncontrollable nausea and vomiting, then call or go to the emergency department.    If you have other questions or have other symptoms or questions you are concerned about then please make a virtual visit   Covid symptoms such as fatigue and cough can linger over 2 weeks, even after the initial fever, aches, chills, and other initial symptoms.   Self  Quarantine: The CDC, Centers for Disease Control has recommended a self quarantine of 5 days from the start of your illness until you are symptom-free including at least 24 hours of no symptoms including no fever, no shortness of breath, and no body aches and chills, by day 5 before returning to work or general contact with the public.  What does self quarantine mean: avoiding contact with people as much as possible.   Particularly in your house, isolate your self from others in a separate room, wear a mask when possible in the room, particularly if coughing a lot.   Have others bring food, water, medications, etc., to your  door, but avoid direct contact with your household contacts during this time to avoid spreading the infection to them.   If you have a separate bathroom and living quarters during the next 2 weeks away from others, that would be preferable.    If you can't completely isolate, then wear a mask, wash hands frequently with soap and water for at least 15 seconds, minimize close contact with others, and have a friend or family member check regularly from a distance to make sure you are not getting seriously worse.     You should not be going out in public, should not be going to stores, to work or other public places until all your symptoms have resolved and at least 5 days + 24 hours of no symptoms at all have transpired.   Ideally you should avoid contact with others for a full 5 days if possible.  One of the goals is to limit spread to high risk people; people that are older and elderly, people with multiple health issues like diabetes, heart disease, lung disease, and anybody that has weakened immune systems such as people with cancer or on immunosuppressive therapy.    Armahn was seen today for covid positive.  Diagnoses and all orders for this visit:  COVID  Acute cough  Appetite impaired  Other orders -     nirmatrelvir /ritonavir , renal dosing, (PAXLOVID ) 10 x 150 MG & 10 x  100MG  TBPK; Take 2 tablets by mouth 2 (two) times daily for 5 days. Dosage for moderate renal impairment (eGFR >/= 30 to <60 mL/min): 150 mg nirmatrelvir  (one 150 mg tablet) with 100 mg ritonavir  (one 100 mg tablet), with both tablets taken together twice daily for 5 days. Not recommended if eGFR < 30 mL/min. PAXLOVID  is not recommend in patients with severe hepatic impairment (Child-Pugh Class C).    Follow up as needed

## 2023-01-16 NOTE — Patient Instructions (Signed)
 Covid infection, covid illness general recommendations:  I recommend you rest, hydrate well with water and clear fluids throughout the day.  If you feel dry in the mouth, tongue or feel that you are urinating as much as usual, then increase hydration.  You urine should be like yellow to clear, not dark yellow or darker.     Pain, body aches, or fever: You can use Tylenol  /Acetaminophen  325mg  over the counter for pain or fever, every 4-6 hours   Cough and congestion: You can use over the counter Mucinex DM or Coricidin HBP for cough and congestion   Nausea: You can use over the counter Emetrol for nausea.     Antiviral medication: Consider  medication Paxlovid  to help reduce your risk of hospitalization or severe illness.  If medicaiton is not available, too expensive or not covered by insurance, then call us  back.  If you do start the medicine we will use a lower dose and given interactions with your other medications.  Cut back on your losartan  to half tablet daily for the next 3 days given potential for dehydration and kidney injury   Other supportive measures: I recommend extra vitamins to help your body fight the illness.   Consider over the counter vitamin pack such as EmergenC Immune Plus which contains extra vitamin C, vitamin D and zinc .     In the next few days, if you are having trouble breathing, if you are very weak, have high fever 103 or higher consistently despite Tylenol , or uncontrollable nausea and vomiting, then call or go to the emergency department.    If you have other questions or have other symptoms or questions you are concerned about then please make a virtual visit   Covid symptoms such as fatigue and cough can linger over 2 weeks, even after the initial fever, aches, chills, and other initial symptoms.   Self Quarantine: The CDC, Centers for Disease Control has recommended a self quarantine of 5 days from the start of your illness until you are  symptom-free including at least 24 hours of no symptoms including no fever, no shortness of breath, and no body aches and chills, by day 5 before returning to work or general contact with the public.  What does self quarantine mean: avoiding contact with people as much as possible.   Particularly in your house, isolate your self from others in a separate room, wear a mask when possible in the room, particularly if coughing a lot.   Have others bring food, water, medications, etc., to your door, but avoid direct contact with your household contacts during this time to avoid spreading the infection to them.   If you have a separate bathroom and living quarters during the next 2 weeks away from others, that would be preferable.    If you can't completely isolate, then wear a mask, wash hands frequently with soap and water for at least 15 seconds, minimize close contact with others, and have a friend or family member check regularly from a distance to make sure you are not getting seriously worse.     You should not be going out in public, should not be going to stores, to work or other public places until all your symptoms have resolved and at least 5 days + 24 hours of no symptoms at all have transpired.   Ideally you should avoid contact with others for a full 5 days if possible.  One of the goals is to limit spread  to high risk people; people that are older and elderly, people with multiple health issues like diabetes, heart disease, lung disease, and anybody that has weakened immune systems such as people with cancer or on immunosuppressive therapy.

## 2023-01-23 ENCOUNTER — Ambulatory Visit: Payer: BC Managed Care – PPO | Admitting: Rehabilitative and Restorative Service Providers"

## 2023-01-23 ENCOUNTER — Encounter: Payer: Self-pay | Admitting: Rehabilitative and Restorative Service Providers"

## 2023-01-23 DIAGNOSIS — M5459 Other low back pain: Secondary | ICD-10-CM | POA: Diagnosis not present

## 2023-01-23 DIAGNOSIS — M6281 Muscle weakness (generalized): Secondary | ICD-10-CM | POA: Diagnosis not present

## 2023-01-23 DIAGNOSIS — R293 Abnormal posture: Secondary | ICD-10-CM | POA: Diagnosis not present

## 2023-01-23 NOTE — Therapy (Signed)
OUTPATIENT PHYSICAL THERAPY THORACOLUMBAR TREATMENT/DISCHARGE NOTE  PHYSICAL THERAPY DISCHARGE SUMMARY  Visits from Start of Care: 3  Current functional level related to goals / functional outcomes: See note   Remaining deficits: See note   Education / Equipment: Updated HEP   Patient agrees to discharge. Patient goals were met. Patient is being discharged due to being pleased with the current functional level.     Patient Name: Zachary [name] MRN: [id number] DOB:[birth year], 63 y.o., male Today's Date: 01/23/2023  END OF SESSION:  PT End of Session - 01/23/23 1654     Visit Number 3    Number of Visits 9    Date for PT Re-Evaluation 03/01/23    Authorization Type BCBS    Authorization - Number of Visits 30    Progress Note Due on Visit 9    PT Start Time 1609    PT Stop Time 1647    PT Time Calculation (min) 38 min    Activity Tolerance Patient tolerated treatment well;No increased pain    Behavior During Therapy WFL for tasks assessed/performed               Past Medical History:  Diagnosis Date   Anxiety    Crohn disease (HCC)    pt reports subsequent testing was normal   Depression    GERD (gastroesophageal reflux disease)    Herpes simplex    Hoarding disorder    Hyperlipidemia    Hypertension    IBS (irritable bowel syndrome)    Incontinence    Past Surgical History:  Procedure Laterality Date   APPENDECTOMY     COLONOSCOPY     POLYPECTOMY     TWISTED TESTICLES  1970   [place]    UMBILICAL HERNIA REPAIR     Patient Active Problem List   Diagnosis Date Noted   Diarrhea 11/16/2022   Mixed hyperlipidemia 09/20/2022   MDD (major depressive disorder), recurrent episode, severe (HCC) 08/31/2021   History of drug overdose 08/30/2021   Tachycardia 06/15/2021   OSA (obstructive sleep apnea) 06/15/2021   Family history of prostate cancer 09/13/2020   Absence of bladder continence 08/30/2020   High risk medication use 08/30/2020    Hypothyroidism 08/30/2020   Incontinence of feces 11/03/2019   OAB (overactive bladder) 10/26/2019   Pancreatic insufficiency 10/26/2019   Spinal stenosis of lumbar region 03/03/2018   Essential hypertension 01/27/2018   OCD (obsessive compulsive disorder) 10/10/2017   Social anxiety disorder 10/10/2017   Seasonal allergies 06/26/2010   History of herpes labialis 06/26/2010   FLATULENCE ERUCTATION AND GAS PAIN 09/23/2007   CROHN'S DISEASE, LARGE AND SMALL INTESTINES 09/22/2007   History of colonic polyps 09/22/2007    PCP: Ronnald Nian, MD  REFERRING PROVIDER: Ronnald Nian, MD  REFERRING DIAG:  Diagnosis  M54.9 (ICD-10-CM) - Upper back pain    Rationale for Evaluation and Treatment: Rehabilitation  THERAPY DIAG:  Abnormal posture  Muscle weakness (generalized)  Other low back pain  ONSET DATE: Gradual onset over a few months  SUBJECTIVE:  SUBJECTIVE STATEMENT:   Zachary [name] notes no sciatica in several weeks.  He wants to review body mechanics and his HEP as he lifts packages all day at work.  Zachary [name] notes a "back ache" of 2-3 months duration that means he "can't run" or "pick-up things."  Left sciatica as distal as the foot is noted at night.  PERTINENT HISTORY:  HLD, HTN, hypothyroid  PAIN:  Are you having pain? NO, was Yes: NPRS scale: 0/10 (was 1-3/10) on a 10/10 Pain location: Mid and low back, no sciatic pain on the left in weeks (was every night) Pain description: Ache Aggravating factors: Sleeping Relieving factors: None  PRECAUTIONS: Back  RED FLAGS: None   WEIGHT BEARING RESTRICTIONS: No  FALLS:  Has patient fallen in last 6 months? No  LIVING ENVIRONMENT: Lives with: lives alone Lives in: House/apartment Stairs:  No problems Has following equipment at  home: None  OCCUPATION: Receiving clerk full-time, requires heavy lifting, standing most of the day  PLOF: Independent  PATIENT GOALS: Get back to previous level of function  NEXT MD VISIT: As needed  OBJECTIVE:  Note: Objective measures were completed at Evaluation unless otherwise noted.  DIAGNOSTIC FINDINGS:  IMPRESSION: 1. Multilevel lower thoracic and lumbar spine degenerative disc disease, most pronounced at L4-5 and L5-S1. 2. No acute finding by plain radiography.  IMPRESSION: Thoracic spine degenerative changes as above. No acute finding by plain radiography.  PATIENT SURVEYS:  01/04/2023 FOTO 88 (Goal met)  Eval: FOTO 65 (Goal 71, risk adjusted 51) in 9 visits  SCREENING FOR RED FLAGS: Bowel or bladder incontinence: No Spinal tumors: No Cauda equina syndrome: No Compression fracture: No Abdominal aneurysm: No  COGNITION: Overall cognitive status: Within functional limits for tasks assessed     SENSATION: Left leg pain to the foot at night  MUSCLE LENGTH: Hamstrings: Right 40 deg; Left 40 deg  POSTURE: rounded shoulders, forward head, and decreased lumbar lordosis  LUMBAR ROM:   AROM 11/14/2022 01/04/2023  Flexion 10 15  Extension    Right lateral flexion    Left lateral flexion    Right rotation    Left rotation     (Blank rows = not tested)  LOWER EXTREMITY ROM:     Passive  Left/Right 11/14/2022    Hip flexion 95/95   Hip extension    Hip abduction    Hip adduction    Hip internal rotation 16/8   Hip external rotation 43/43   Knee flexion    Knee extension    Ankle dorsiflexion    Ankle plantarflexion    Ankle inversion    Ankle eversion     (Blank rows = not tested)  STRENGTH:    MMT Left/Right 11/14/2022 Left/Right 01/23/2023  Hip flexion    Hip extension    Hip abduction    Hip adduction    Hip internal rotation    Hip external rotation    Knee flexion    Knee extension    Ankle dorsiflexion    Ankle plantarflexion     Ankle inversion    Ankle eversion    Spine extension 16 seconds 15 seconds (Zachary [name] has Covid so he feels weak globally today)   (Blank rows = not tested)  LUMBAR SPECIAL TESTS:  Prone strength test 16 seconds (Goal 30-60 seconds)  GAIT: Distance walked: 30 feet Assistive device utilized: None Level of assistance: Complete Independence Comments: Significant for forward flexed posture  TODAY'S TREATMENT:  DATE:  01/23/2023 Lumbar extension AROM 10 x 3 seconds Scapular retraction/shoulder blade pinches 10 x 5 seconds Hip hike in door way 2 sets of 10 for 3 seconds Yoga Bridge 10 x 5 seconds  Functional Activities: Review and practical work with Architectural technologist for Penelope' job including diagonal squat lift with head lift cue.  Reviewed the importance of daily HEP compliance, a home walking program 3 times a week and continued attention to posture and body mechanics at work and home. Sit to stand 5 x slow eccentrics for quadriceps strength and avoiding bad body mechanics with fatigue at work.   01/04/2023 Lumbar extension AROM 10 x 3 seconds Scapular retraction/shoulder blade pinches 10 x 5 seconds Hip hike in door way 2 sets of 10 for 3 seconds Yoga Bridge 10 x 5 seconds  Functional Activities: Review and practical work with Architectural technologist for Rohith' job including diagonal squat lift.  Reviewed exam findings and HEP with progressions to low back strength.   11/14/2022 Lumbar extension AROM 10 x 3 seconds Scapular retraction/shoulder blade pinches 10 x 5 seconds Prone alternating hip extension 10 x 3 seconds  Functional Activities: Discussed proper standing posture, spine anatomy with the model and related this to his imaging which we reviewed together.  Discussed correct lifting techniques and demonstrated diagonal squat and golfer's lifts.   Reviewed examination findings and his day 1 home exercise program including a home walking program to be completed on a 3 times a week basis for 20 minutes or more at a time.  Discussed frequency and duration of rehabilitation services recommended.   PATIENT EDUCATION:  Education details: See above Person educated: Patient Education method: Explanation, Demonstration, Tactile cues, Verbal cues, and Handouts Education comprehension: verbalized understanding, returned demonstration, verbal cues required, tactile cues required, and needs further education  HOME EXERCISE PROGRAM: Access Code: [email or web address] URL: [email or web address] Date: 01/23/2023 Prepared by: Pauletta Browns  Exercises - Standing Lumbar Extension at Wall - Forearms  - 5 x daily - 7 x weekly - 1 sets - 5 reps - 3 seconds hold - Standing Scapular Retraction  - 5 x daily - 7 x weekly - 1 sets - 5 reps - 5 second hold - Standing Hip Hiking  - 2-3 x daily - 7 x weekly - 1 sets - 10 reps - 3 seconds hold - Sit to Stand with Armchair  - 7 x weekly - 1 sets - 5-10 reps - Supine Bridge  - 1 x daily - 7 x weekly - 1 sets - 10 reps - 5 second hold  ASSESSMENT:  CLINICAL IMPRESSION: Kumar has made excellent overall progress since starting physical therapy.  Despite mostly independent rehabilitation, Nicholai no longer has sciatic symptoms and is doing much better with his body mechanics lifting packages at work.  Daimien wanted a refresher on body mechanics and an update to his home exercises today and this was provided.  Because he has not had sciatic symptoms in several weeks, he is comfortable with his current home exercises and had his questions answered in regard to posture and body mechanics, Regal appears ready to transfer into independent rehabilitation.  Nicko was a pleasure to work with and is welcome to return to physical therapy if progress with his independent rehabilitation is not as anticipated.   Patient is  a 63 y.o. male who was seen today for physical therapy evaluation and treatment for  Diagnosis  M54.9 (ICD-10-CM) - Upper back pain  .  Barre has a 2  to 15-month history of mid and low back pain.  He is also noted left leg symptoms as distal as the foot at night.  Amier has good overall flexibility and AROM, although he does have poor low back strength, faulty body mechanics and postural abnormalities.  These were addressed with today's examination and home exercise program and will need to be addressed over the next few visits for him to meet long-term goals established below.  Lashone' prognosis remains good with the recommended plan of care.  OBJECTIVE IMPAIRMENTS: decreased activity tolerance, decreased endurance, decreased knowledge of condition, decreased ROM, decreased strength, impaired perceived functional ability, improper body mechanics, postural dysfunction, and pain.   ACTIVITY LIMITATIONS: carrying, lifting, bending, standing, sleeping, and bed mobility  PARTICIPATION LIMITATIONS: community activity and occupation  PERSONAL FACTORS: HLD, HTN, hypothyroid are also affecting patient's functional outcome.   REHAB POTENTIAL: Good  CLINICAL DECISION MAKING: Stable/uncomplicated  EVALUATION COMPLEXITY: Low   GOALS: Goals reviewed with patient? Yes  SHORT TERM GOALS: Target date: 12/12/2022  Corbitt will be independent with his day 1 HEP Baseline: Started 11/14/2022 Goal status: Met 01/04/2023  2.  Marwin will be able to maintain the prone spine strength test position for 30 seconds Baseline: 16 seconds Goal status: On Going 01/23/2023  3.  Gradie will be able to demonstrate appropriate lift techniques as required for his work Baseline: Admittedly poor body mechanics at evaluation Goal status: Met 01/23/2023   LONG TERM GOALS: Target date: 03/01/2023  Improve FOTO to 71 in 9 visits Baseline: Risk adjusted 51, 65 Goal status: Met 01/04/2023  2.  Natanael will report  back pain consistently 0-1/10 on the Numeric Pain Rating Scale Baseline: 1-3/10 Goal status: Met 01/04/2023  3.  Jaithan will be able to hold the spine strength test position for 45 to 60 seconds Baseline: 16 seconds Goal status: On Going 01/23/2023  4.  Jodan will be able to perform his normal work activities without increasing back pain above the level of 1/10 Baseline: Limited with work with 3/10 pain Goal status: Met 01/04/2023  5.  Heberto will be independent with his long-term maintenance HEP at DC Baseline: Started 11/14/2022 Goal status: Met 01/23/2023  PLAN:  PT FREQUENCY: Discharge  PT DURATION: Discharge  PLANNED INTERVENTIONS: 97110-Therapeutic exercises, 97530- Therapeutic activity, 97112- Neuromuscular re-education, 97535- Self Care, 04540- Manual therapy, 97012- Traction (mechanical), Patient/Family education, Dry Needling, Spinal mobilization, Cryotherapy, and Moist heat.  PLAN FOR NEXT SESSION: Discharge   Cherlyn Cushing, PT, MPT 01/23/2023, 5:04 PM

## 2023-01-30 ENCOUNTER — Encounter: Payer: BC Managed Care – PPO | Admitting: Rehabilitative and Restorative Service Providers"

## 2023-02-09 ENCOUNTER — Other Ambulatory Visit: Payer: Self-pay | Admitting: Medical

## 2023-02-09 ENCOUNTER — Other Ambulatory Visit: Payer: Self-pay | Admitting: Family Medicine

## 2023-02-09 ENCOUNTER — Other Ambulatory Visit: Payer: Self-pay | Admitting: Psychiatry

## 2023-02-09 ENCOUNTER — Other Ambulatory Visit: Payer: Self-pay | Admitting: Gastroenterology

## 2023-02-09 DIAGNOSIS — F4010 Social phobia, unspecified: Secondary | ICD-10-CM

## 2023-02-09 DIAGNOSIS — F422 Mixed obsessional thoughts and acts: Secondary | ICD-10-CM

## 2023-02-09 DIAGNOSIS — F331 Major depressive disorder, recurrent, moderate: Secondary | ICD-10-CM

## 2023-02-09 DIAGNOSIS — M79672 Pain in left foot: Secondary | ICD-10-CM

## 2023-02-11 NOTE — Telephone Encounter (Signed)
 This was discontinued in may 2024

## 2023-02-12 ENCOUNTER — Telehealth: Payer: Self-pay | Admitting: Psychiatry

## 2023-02-12 ENCOUNTER — Other Ambulatory Visit: Payer: Self-pay

## 2023-02-12 DIAGNOSIS — G4733 Obstructive sleep apnea (adult) (pediatric): Secondary | ICD-10-CM | POA: Diagnosis not present

## 2023-02-12 DIAGNOSIS — E871 Hypo-osmolality and hyponatremia: Secondary | ICD-10-CM

## 2023-02-12 MED ORDER — SODIUM CHLORIDE 1 G PO TABS: 1.0000 g | ORAL_TABLET | Freq: Every day | ORAL | 0 refills | Status: DC

## 2023-02-12 NOTE — Telephone Encounter (Signed)
Called pt, no answer, left voicemail.

## 2023-02-12 NOTE — Telephone Encounter (Signed)
Sent rx to [facility].

## 2023-02-12 NOTE — Telephone Encounter (Signed)
Kervens called at 2:38 to request refill of his Sodium Chloride 1 g.  Next appt 4/25.  Send to  [id number] [place], [place] - [address]

## 2023-02-12 NOTE — Telephone Encounter (Signed)
Pt is not taking and has not taken for a year.

## 2023-02-26 ENCOUNTER — Other Ambulatory Visit: Payer: Self-pay | Admitting: Psychiatry

## 2023-02-26 DIAGNOSIS — F331 Major depressive disorder, recurrent, moderate: Secondary | ICD-10-CM

## 2023-02-27 ENCOUNTER — Other Ambulatory Visit: Payer: Self-pay

## 2023-02-27 ENCOUNTER — Telehealth: Payer: Self-pay | Admitting: Family Medicine

## 2023-02-27 DIAGNOSIS — F331 Major depressive disorder, recurrent, moderate: Secondary | ICD-10-CM

## 2023-02-27 DIAGNOSIS — F422 Mixed obsessional thoughts and acts: Secondary | ICD-10-CM

## 2023-02-27 DIAGNOSIS — F4010 Social phobia, unspecified: Secondary | ICD-10-CM

## 2023-02-27 MED ORDER — FLUVOXAMINE MALEATE 100 MG PO TABS: ORAL_TABLET | ORAL | 0 refills | Status: DC

## 2023-02-27 NOTE — Telephone Encounter (Signed)
 Fax from [facility]  Emtricittabine-tenofv 200-300 mg          pt new to pharmacy, needs new rx

## 2023-03-01 DIAGNOSIS — G4733 Obstructive sleep apnea (adult) (pediatric): Secondary | ICD-10-CM | POA: Diagnosis not present

## 2023-04-23 ENCOUNTER — Ambulatory Visit: Payer: BC Managed Care – PPO | Admitting: Psychiatry

## 2023-04-24 ENCOUNTER — Telehealth: Payer: Self-pay | Admitting: Family Medicine

## 2023-04-24 DIAGNOSIS — Z7251 High risk heterosexual behavior: Secondary | ICD-10-CM

## 2023-04-24 MED ORDER — EMTRICITABINE-TENOFOVIR DF 200-300 MG PO TABS: 1.0000 | ORAL_TABLET | Freq: Every day | ORAL | 0 refills | Status: DC

## 2023-04-24 NOTE — Telephone Encounter (Signed)
 Patient requesting fill of ememtricitabine-tenofv, not on current med list. Please advise.

## 2023-04-24 NOTE — Telephone Encounter (Signed)
 Copied from CRM [id number]. Topic: Clinical - Medication Refill >> Apr 24, 2023 11:02 AM Antwanette L wrote: Most Recent Primary Care Visit:  Provider: [facility]  Department: [facility]  Visit Type: LAB  Date: 01/16/2023  Medication: emtricitabine -tenofv  Has the patient contacted their pharmacy? Yes  Is this the correct pharmacy for this prescription? [id number] [place], [place] - [address] [place] [address] Phone: [phone] Fax: [phone]   Has the prescription been filled recently? No  Is the patient out of the medication? No. Patient on has three pills left  Has the patient been seen for an appointment in the last year OR does the patient have an upcoming appointment? Yes  Can we respond through MyChart? No. Contact patient by phone at [phone]  Agent: Please be advised that Rx refills may take up to 3 business days. We ask that you follow-up with your pharmacy.

## 2023-04-24 NOTE — Addendum Note (Signed)
 Addended by: Watson Hacking on: 04/24/2023 08:44 PM   Modules accepted: Orders

## 2023-04-25 NOTE — Telephone Encounter (Signed)
 Does he need OV or only labs?

## 2023-04-25 NOTE — Telephone Encounter (Signed)
 I put in labs orders for HIV and RPR since its a prep medication.

## 2023-04-25 NOTE — Addendum Note (Signed)
 Addended by: Charliene Conte A on: 04/25/2023 08:57 AM   Modules accepted: Orders

## 2023-04-29 ENCOUNTER — Telehealth: Payer: Self-pay

## 2023-04-29 NOTE — Telephone Encounter (Signed)
 Called pt & he states he no longer needs PREP, has been taking it for over a year but no longer needs to take it, so labs are not needed at this time & pt has CPE scheduled in 10/25

## 2023-04-29 NOTE — Telephone Encounter (Signed)
 Please call pt. To schedule lab visit orders in the system already.   Copied from CRM [id number]. Topic: Clinical - Medical Advice >> Apr 29, 2023  1:06 PM Carlatta H wrote: Reason for CRM: Please call patient about lab orders//

## 2023-04-29 NOTE — Telephone Encounter (Signed)
 Left message for pt to schedule lab appt.

## 2023-05-14 DIAGNOSIS — Z6831 Body mass index (BMI) 31.0-31.9, adult: Secondary | ICD-10-CM | POA: Diagnosis not present

## 2023-05-14 DIAGNOSIS — G4733 Obstructive sleep apnea (adult) (pediatric): Secondary | ICD-10-CM | POA: Diagnosis not present

## 2023-05-30 ENCOUNTER — Other Ambulatory Visit: Payer: Self-pay | Admitting: Psychiatry

## 2023-05-30 ENCOUNTER — Other Ambulatory Visit: Payer: Self-pay | Admitting: Gastroenterology

## 2023-05-30 DIAGNOSIS — F331 Major depressive disorder, recurrent, moderate: Secondary | ICD-10-CM

## 2023-06-03 DIAGNOSIS — G4733 Obstructive sleep apnea (adult) (pediatric): Secondary | ICD-10-CM | POA: Diagnosis not present

## 2023-06-03 DIAGNOSIS — Z6831 Body mass index (BMI) 31.0-31.9, adult: Secondary | ICD-10-CM | POA: Diagnosis not present

## 2023-06-11 DIAGNOSIS — G4733 Obstructive sleep apnea (adult) (pediatric): Secondary | ICD-10-CM | POA: Diagnosis not present

## 2023-06-17 ENCOUNTER — Other Ambulatory Visit: Payer: Self-pay | Admitting: Psychiatry

## 2023-06-17 DIAGNOSIS — F4010 Social phobia, unspecified: Secondary | ICD-10-CM

## 2023-06-17 DIAGNOSIS — F422 Mixed obsessional thoughts and acts: Secondary | ICD-10-CM

## 2023-06-17 DIAGNOSIS — G3184 Mild cognitive impairment, so stated: Secondary | ICD-10-CM

## 2023-06-17 DIAGNOSIS — F331 Major depressive disorder, recurrent, moderate: Secondary | ICD-10-CM

## 2023-06-26 ENCOUNTER — Ambulatory Visit: Admitting: Medical

## 2023-06-26 ENCOUNTER — Encounter: Payer: Self-pay | Admitting: Medical

## 2023-06-26 VITALS — BP 130/86 | HR 80 | Wt 248.0 lb

## 2023-06-26 DIAGNOSIS — M2141 Flat foot [pes planus] (acquired), right foot: Secondary | ICD-10-CM | POA: Diagnosis not present

## 2023-06-26 DIAGNOSIS — L98491 Non-pressure chronic ulcer of skin of other sites limited to breakdown of skin: Secondary | ICD-10-CM

## 2023-06-26 DIAGNOSIS — M2142 Flat foot [pes planus] (acquired), left foot: Secondary | ICD-10-CM

## 2023-06-26 DIAGNOSIS — S90829A Blister (nonthermal), unspecified foot, initial encounter: Secondary | ICD-10-CM

## 2023-06-26 DIAGNOSIS — L602 Onychogryphosis: Secondary | ICD-10-CM | POA: Diagnosis not present

## 2023-06-26 MED ORDER — SILVER SULFADIAZINE 1 % EX CREA: 1.0000 | TOPICAL_CREAM | Freq: Every day | CUTANEOUS | 0 refills | Status: DC

## 2023-06-26 NOTE — Progress Notes (Signed)
 Subjective: Chief Complaint  Patient presents with   other    Bad sores on feet been there a few weeks,    Here for some sores on both feet.  He purchased new shoes about 3 to 4 weeks ago and ever since he has been using his new shoes he is getting blisters and sores on the bottom of both feet.  His prior shoes were worn out.  He notes that he does not have good foot arches.  No other complaint  Past Medical History:  Diagnosis Date   Anxiety    Crohn disease (HCC)    pt reports subsequent testing was normal   Depression    GERD (gastroesophageal reflux disease)    Herpes simplex    Hoarding disorder    Hyperlipidemia    Hypertension    IBS (irritable bowel syndrome)    Incontinence    Current Outpatient Medications on File Prior to Visit  Medication Sig Dispense Refill   aspirin  81 MG EC tablet Take 81 mg by mouth daily. Swallow whole.     budesonide  (ENTOCORT EC ) 3 MG 24 hr capsule Take 3 capsules (9 mg total) by mouth daily. 270 capsule 3   busPIRone  (BUSPAR ) 30 MG tablet TAKE 1 TABLET BY MOUTH 2 TIMES DAILY. 180 tablet 2   CREON  36000-114000 units CPEP capsule TAKE 4 CAPSULES BY MOUTH BEFORE A MEAL AND 2 CAPSULES BY MOUTH WITH EACH SNACK 500 capsule 2   donepezil  (ARICEPT ) 10 MG tablet TAKE 1 TABLET BY MOUTH EVERY NIGHT AT BEDTIME 90 tablet 3   fluvoxaMINE  (LUVOX ) 100 MG tablet TAKE 4 TABLETS BY MOUTH AT BEDTIME 240 tablet 0   lamoTRIgine  (LAMICTAL ) 200 MG tablet TAKE 1 TABLET BY MOUTH DAILY 90 tablet 0   levothyroxine  (SYNTHROID ) 75 MCG tablet TAKE 1 TABLET BY MOUTH ONCE DAILY 90 tablet 3   loperamide  (IMODIUM ) 2 MG capsule Take 1 capsule (2 mg total) by mouth every 4 (four) hours. As needed 60 capsule 2   losartan  (COZAAR ) 100 MG tablet Take 1 tablet (100 mg total) by mouth daily. 90 tablet 3   OLANZapine  (ZYPREXA ) 15 MG tablet TAKE 1 TABLET BY MOUTH EVERYDAY AT BEDTIME 90 tablet 0   psyllium (REGULOID) 0.52 g capsule Take 0.52 g by mouth daily.     sodium chloride  1 g  tablet Take 1 tablet (1 g total) by mouth daily. 90 tablet 0   dicyclomine  (BENTYL ) 10 MG capsule TAKE 1 CAPSULE BY MOUTH 3 TIMES DAILY AS NEEDED FOR ABDOMINAL PAIN OR CRAMPING (Patient not taking: Reported on 06/26/2023) 270 capsule 0   emtricitabine -tenofovir  (TRUVADA) 200-300 MG tablet Take 1 tablet by mouth daily. (Patient not taking: Reported on 06/26/2023) 90 tablet 0   No current facility-administered medications on file prior to visit.       Objective: BP 130/86   Pulse 80   Wt 248 lb (112.5 kg)   BMI 31.00 kg/m   Feet: Left great toenail with thickened hypertrophic nail, yellowish to brown coloration of several nails of both feet.  Right foot volar surface under arch medially with 2cm x 1.5cm ulceration approx 1mm deep with some yellowish scab, similar on left volar foot under arch medially with 2cm diameter flat blister/bullous lesion 2+ pulses with normal cap refill, feet neurovascularly intact   Assessment: Encounter Diagnoses  Name Primary?   Blister of foot, unspecified laterality, initial encounter Yes   Pes planus of both feet    Skin ulcer, limited to breakdown  of skin (HCC)    Hypertrophic toenail      Plan: Blister, decreased arch of both feet, skin ulcer of feet During the daytime use a layer of Silvadene cream on both blisters and cover with a large Band-Aid I would recommend you begin using an arch support in your shoes to build up the arch Consider using a different shoe for the next week or 2 until the current wounds heal up I recommend using a thicker sock such as a diabetic sock or wool sock or boot sock At nighttime do not wear socks and let your feet air out so the blisters and wounds can heal  Since you have hypertrophic toenails, I am referring you to podiatry for treatment recommendations  We applied some Silvadene cream to both feet with a bandage today  Benton was seen today for other.  Diagnoses and all orders for this visit:  Blister  of foot, unspecified laterality, initial encounter  Pes planus of both feet  Skin ulcer, limited to breakdown of skin (HCC)  Hypertrophic toenail -     Ambulatory referral to Podiatry  Other orders -     silver sulfADIAZINE (SILVADENE) 1 % cream; Apply 1 Application topically daily.  F/u with podiatry

## 2023-06-26 NOTE — Patient Instructions (Signed)
 Blister, decreased arch of both feet, skin ulcer of feet During the daytime use a layer of Silvadene cream on both blisters and cover with a large Band-Aid I would recommend you begin using an arch support in your shoes to build up the arch Consider using a different shoe for the next week or 2 until the current wounds heal up I recommend using a thicker sock such as a diabetic sock or wool sock or boot sock At nighttime do not wear socks and let your feet air out so the blisters and wounds can heal  Since you have hypertrophic toenails, I am referring you to podiatry for treatment recommendations

## 2023-07-01 ENCOUNTER — Ambulatory Visit: Admitting: Psychiatry

## 2023-07-08 DIAGNOSIS — G4733 Obstructive sleep apnea (adult) (pediatric): Secondary | ICD-10-CM | POA: Diagnosis not present

## 2023-07-08 DIAGNOSIS — Z4542 Encounter for adjustment and management of neuropacemaker (brain) (peripheral nerve) (spinal cord): Secondary | ICD-10-CM | POA: Diagnosis not present

## 2023-07-17 DIAGNOSIS — Z4542 Encounter for adjustment and management of neuropacemaker (brain) (peripheral nerve) (spinal cord): Secondary | ICD-10-CM | POA: Diagnosis not present

## 2023-07-17 DIAGNOSIS — G4733 Obstructive sleep apnea (adult) (pediatric): Secondary | ICD-10-CM | POA: Diagnosis not present

## 2023-07-31 ENCOUNTER — Ambulatory Visit (INDEPENDENT_AMBULATORY_CARE_PROVIDER_SITE_OTHER): Admitting: Podiatry

## 2023-07-31 ENCOUNTER — Encounter: Payer: Self-pay | Admitting: Podiatry

## 2023-07-31 DIAGNOSIS — M79671 Pain in right foot: Secondary | ICD-10-CM

## 2023-07-31 DIAGNOSIS — B351 Tinea unguium: Secondary | ICD-10-CM | POA: Diagnosis not present

## 2023-07-31 DIAGNOSIS — L600 Ingrowing nail: Secondary | ICD-10-CM | POA: Diagnosis not present

## 2023-07-31 DIAGNOSIS — M79672 Pain in left foot: Secondary | ICD-10-CM | POA: Diagnosis not present

## 2023-07-31 NOTE — Progress Notes (Signed)
 Patient presents for evaluation and treatment of tenderness and some redness around nails feet.  Tenderness around toes with walking and wearing shoes.  Hallux nail on the left particularly bothersome especially when wearing shoes and walking a lot.  Physical exam:  General appearance: Alert, pleasant, and in no acute distress.  Vascular: Pedal pulses: DP 2/4 B/L, PT 2/4 B/L.  Mild to moderate edema lower legs bilaterally  Neurological:  Light touch intact.  Normal Achilles tendon reflex.  No clonus or spasticity noted.  Dermatologic:  Nails thickened, disfigured, discolored 1-5 BL with subungual debris.  Redness and hypertrophic nail folds along nail folds bilaterally but no signs of drainage or infection.  Hallux nail left is extremely dystrophic with incurvated borders.  Nail is very thickened and gryphotic.  Irritation on the nailbed and nail fold.  Musculoskeletal:  Hammertoes 2 through 5 bilaterally.  Normal muscle strength around ankle   Diagnosis: 1. Painful onychomycotic nails 1 through 5 bilaterally. 2. Pain toes 1 through 5 bilaterally. 3.  Ingrown nails bilaterally  Plan: -New office visit for evaluation and management level 3.  Modifier 25. - I discussed with him the thick dystrophic ingrown nail on the hallux left.  If the start continues to be a problem or becomes more painful would recommend matrixectomy of the nail.  We can also just do periodic debridement of the mycotic nails 1 through 5 bilaterally  -Debrided onychomycotic nails 1 through 5 bilaterally.  Return 3 months RFC

## 2023-08-05 DIAGNOSIS — G4733 Obstructive sleep apnea (adult) (pediatric): Secondary | ICD-10-CM | POA: Diagnosis not present

## 2023-08-05 DIAGNOSIS — Z4542 Encounter for adjustment and management of neuropacemaker (brain) (peripheral nerve) (spinal cord): Secondary | ICD-10-CM | POA: Diagnosis not present

## 2023-09-03 ENCOUNTER — Other Ambulatory Visit: Payer: Self-pay | Admitting: Gastroenterology

## 2023-09-03 DIAGNOSIS — R197 Diarrhea, unspecified: Secondary | ICD-10-CM

## 2023-09-03 DIAGNOSIS — K5080 Crohn's disease of both small and large intestine without complications: Secondary | ICD-10-CM

## 2023-09-04 ENCOUNTER — Other Ambulatory Visit: Payer: Self-pay | Admitting: Family Medicine

## 2023-09-04 ENCOUNTER — Other Ambulatory Visit: Payer: Self-pay | Admitting: Psychiatry

## 2023-09-04 ENCOUNTER — Other Ambulatory Visit: Payer: Self-pay | Admitting: Gastroenterology

## 2023-09-04 ENCOUNTER — Encounter: Payer: Self-pay | Admitting: Family Medicine

## 2023-09-04 ENCOUNTER — Ambulatory Visit: Admitting: Family Medicine

## 2023-09-04 VITALS — BP 116/72 | HR 68 | Ht 75.0 in | Wt 231.8 lb

## 2023-09-04 DIAGNOSIS — K5080 Crohn's disease of both small and large intestine without complications: Secondary | ICD-10-CM

## 2023-09-04 DIAGNOSIS — R194 Change in bowel habit: Secondary | ICD-10-CM

## 2023-09-04 DIAGNOSIS — F331 Major depressive disorder, recurrent, moderate: Secondary | ICD-10-CM

## 2023-09-04 DIAGNOSIS — Z23 Encounter for immunization: Secondary | ICD-10-CM

## 2023-09-04 DIAGNOSIS — F4010 Social phobia, unspecified: Secondary | ICD-10-CM

## 2023-09-04 DIAGNOSIS — F422 Mixed obsessional thoughts and acts: Secondary | ICD-10-CM

## 2023-09-04 NOTE — Progress Notes (Signed)
   Subjective:    Patient ID: Zachary [name], male    DOB: [birth year], 63 y.o.   MRN: [id number]  Discussed the use of AI scribe software for clinical note transcription with the patient, who gave verbal consent to proceed.  History of Present Illness   Zachary [name] is a 63 year old male who presents with changes in bowel habits.  He has experienced a two-week history of decreased bowel movements, with only two occurrences during this period. One was a day of diarrhea, and the other was a bowel movement described as 'a full foot long.' His normal bowel habit is daily movements, but this has decreased significantly in the past two weeks.  He recently changed his eating habits, consuming less food in an effort to lose weight, which began approximately one week ago. He is eating less of everything, which may be contributing to the change in bowel habits.  He is currently taking Entocort and Creon . He has not been taking dicyclomine  as it is prescribed for cramping, which he is not experiencing. He mentions a concern about being 'so clogged' and the potential for other medical problems, although he had a bowel movement a few days ago.  He has a history of taking PrEP for sexual health, which he continued until recently despite not being sexually active since January of the previous year.  He is concerned about whether he needs any blood work since he was taking PrEP for so long.          Review of Systems     Objective:    Physical Exam Alert and in no distress.  Active bowel sounds noted.  No masses or tenderness appreciated.             Assessment & Plan:     Change in bowel habits Change in bowel habits likely due to decreased food intake for weight loss. No constipation. - Continue Entocort and Creon . - Recommend Mediterranean diet with reduced carbohydrates, increased fruits, vegetables, fish, chicken, legumes. - Advise Miralax for regular bowel habits. Crohn's  disease PrEP use unnecessary due to lack of sexual activity. No STD concerns.  I explained that testing was done to make sure he did not have an STD and since that is not an issue no testing needs to be done at this time.

## 2023-09-09 DIAGNOSIS — Z4542 Encounter for adjustment and management of neuropacemaker (brain) (peripheral nerve) (spinal cord): Secondary | ICD-10-CM | POA: Diagnosis not present

## 2023-09-09 DIAGNOSIS — G4733 Obstructive sleep apnea (adult) (pediatric): Secondary | ICD-10-CM | POA: Diagnosis not present

## 2023-10-07 DIAGNOSIS — Z4542 Encounter for adjustment and management of neuropacemaker (brain) (peripheral nerve) (spinal cord): Secondary | ICD-10-CM | POA: Diagnosis not present

## 2023-10-07 DIAGNOSIS — G4733 Obstructive sleep apnea (adult) (pediatric): Secondary | ICD-10-CM | POA: Diagnosis not present

## 2023-10-15 ENCOUNTER — Telehealth: Payer: Self-pay

## 2023-10-15 NOTE — Telephone Encounter (Signed)
Call to RS.  No work in

## 2023-10-15 NOTE — Telephone Encounter (Signed)
 Lvm for pt to make an appt to see dr. geoffry

## 2023-10-15 NOTE — Telephone Encounter (Signed)
 Pt has not been seen since 12/2022 and had no shows. Pt was due back March/April 2025. If pt is continuing to get refills he will need to have an apt.  Received PA for Fluvoxamine 

## 2023-10-16 NOTE — Telephone Encounter (Signed)
 PA approved Fluvoxamine  100 mg tabs, 4/day #240/60 day with [facility]  10/15/23-10/14/24

## 2023-10-31 ENCOUNTER — Encounter: Payer: Self-pay | Admitting: Family Medicine

## 2023-10-31 ENCOUNTER — Ambulatory Visit: Admitting: Podiatry

## 2023-10-31 ENCOUNTER — Ambulatory Visit: Payer: BC Managed Care – PPO | Admitting: Family Medicine

## 2023-10-31 VITALS — BP 118/76 | HR 70 | Ht 74.0 in | Wt 225.8 lb

## 2023-10-31 DIAGNOSIS — E782 Mixed hyperlipidemia: Secondary | ICD-10-CM | POA: Diagnosis not present

## 2023-10-31 DIAGNOSIS — Z0000 Encounter for general adult medical examination without abnormal findings: Secondary | ICD-10-CM | POA: Diagnosis not present

## 2023-10-31 DIAGNOSIS — Z125 Encounter for screening for malignant neoplasm of prostate: Secondary | ICD-10-CM | POA: Diagnosis not present

## 2023-10-31 DIAGNOSIS — E039 Hypothyroidism, unspecified: Secondary | ICD-10-CM

## 2023-10-31 DIAGNOSIS — Z131 Encounter for screening for diabetes mellitus: Secondary | ICD-10-CM | POA: Diagnosis not present

## 2023-10-31 DIAGNOSIS — Z23 Encounter for immunization: Secondary | ICD-10-CM | POA: Diagnosis not present

## 2023-10-31 DIAGNOSIS — J302 Other seasonal allergic rhinitis: Secondary | ICD-10-CM

## 2023-10-31 DIAGNOSIS — E871 Hypo-osmolality and hyponatremia: Secondary | ICD-10-CM

## 2023-10-31 DIAGNOSIS — K50818 Crohn's disease of both small and large intestine with other complication: Secondary | ICD-10-CM

## 2023-10-31 DIAGNOSIS — I10 Essential (primary) hypertension: Secondary | ICD-10-CM

## 2023-10-31 DIAGNOSIS — Z113 Encounter for screening for infections with a predominantly sexual mode of transmission: Secondary | ICD-10-CM

## 2023-10-31 LAB — POCT GLYCOSYLATED HEMOGLOBIN (HGB A1C): Hemoglobin A1C: 5.6 % — AB (ref 4.0–5.6)

## 2023-10-31 MED ORDER — FLUTICASONE PROPIONATE 50 MCG/ACT NA SUSP: 2.0000 | Freq: Every day | NASAL | 6 refills | Status: AC

## 2023-10-31 MED ORDER — LORATADINE 10 MG PO TABS
10.0000 mg | ORAL_TABLET | Freq: Every day | ORAL | 11 refills | Status: AC
Start: 2023-10-31 — End: ?

## 2023-10-31 MED ORDER — SODIUM CHLORIDE 1 G PO TABS: 1.0000 g | ORAL_TABLET | Freq: Every day | ORAL | 0 refills | Status: AC

## 2023-10-31 NOTE — Progress Notes (Signed)
 Name: Zachary [name]   Date of Visit: 10/31/23   Date of last visit with me: Visit date not found   CHIEF COMPLAINT:  Chief Complaint  Patient presents with   Annual Exam    Cpe. Wants to be checked for VD, prostate cancer, diabetes, has had some falls, allergy referral, chest pain every here and there, has as very bad bruise on leg.        HPI:  Discussed the use of AI scribe software for clinical note transcription with the patient, who gave verbal consent to proceed.  History of Present Illness   Zachary [name] is a 63 year old male with Crohn's disease who presents with concerns about falls and memory issues.  He experiences falls primarily when attempting to sit on the floor, often landing on his buttocks. He also mentions three falls from bed over the past few years, which he attributes to nightmares. He does not frequently remember his dreams but recalls a nightmare triggered by something he saw on the internet. He is unsure of the total number of falls but notes that most occur when trying to get down on the floor for work. No dizziness while walking.  He has a history of low sodium and potassium levels. He drinks at least 60 to 80 ounces of water daily but is not currently taking sodium pills due to prescription issues. He does not consume Gatorade but has no stomach issues with it. He has a history of low blood pressure.  He experiences regular allergy symptoms, including drainage, and has tried over-the-counter medications like Claritin and possibly Allegra, but does not recall significant relief. No eye issues, and he attributes his symptoms to nasal sinuses.  He is concerned about developing dementia, noting difficulty registering work emails and understanding unusual instructions from his boss. He reports good sleep quality and has been diagnosed with sleep apnea, for which he has an Inspire implant. He has adjusted his sleep schedule to go to bed later, which  seems to help.  He has not had sexual activity for over a year and a half and is unsure if he has been checked for STDs since then. He does not use medications like Cialis or Viagra and expresses no interest in them.         OBJECTIVE:       10/31/2023    9:34 AM  Depression screen PHQ 2/9  Decreased Interest 0  Down, Depressed, Hopeless 0  PHQ - 2 Score 0     BP Readings from Last 3 Encounters:  10/31/23 118/76  09/04/23 116/72  06/26/23 130/86    BP 118/76   Pulse 70   Ht 6' 2 (1.88 m)   Wt 225 lb 12.8 oz (102.4 kg)   SpO2 98%   BMI 28.99 kg/m    Physical Exam          Physical Exam Constitutional:      Appearance: Normal appearance.  Neurological:     General: No focal deficit present.     Mental Status: He is alert and oriented to person, place, and time. Mental status is at baseline.     ASSESSMENT/PLAN:   Assessment & Plan Mixed hyperlipidemia  Need for COVID-19 vaccine  Routine general medical examination at a health care facility  Hypothyroidism, unspecified type  Need for pneumococcal 20-valent conjugate vaccination  Screening for diabetes mellitus  Hyponatremia  Crohn's disease of both small and large intestine with other complication (HCC)  Essential hypertension  Screening PSA (prostate specific antigen)  Seasonal allergies  Screen for STD (sexually transmitted disease)    Assessment and Plan    Falls Falls likely due to orthostatic hypotension and functional balance issues. Possible electrolyte imbalances contributing. - Increase Gatorade intake to maintain potassium and sodium levels. - Refill sodium tablets but advise against use if Gatorade resolves symptoms. - Consider physical therapy if functional balance issues persist.  Hyponatremia Previous low sodium levels, currently not taking sodium pills. - Refill sodium tablets. - Monitor sodium levels with increased Gatorade intake.  Allergic rhinitis with chronic  sinus drainage Chronic sinus drainage due to allergic rhinitis. Previous antihistamines ineffective. - Continue Claritin daily. - Initiate Flonase nasal spray, one spray each nostril daily for at least three weeks and continue throughout allergy season.  Crohn's disease with skin manifestations Minor skin changes likely related to Crohn's disease. - CBC and CMP ordered as well.  Obstructive sleep apnea with Inspire implant Managed with Inspire implant. Recent sleep schedule adjustment beneficial.  Age-related cognitive changes Memory and cognitive concerns likely age-related, not dementia. - Recommend memory games or apps like Neuriva to improve short-term memory.     Annual physical - PCV and Covid vaccine administered today.  Comprehensive annual physical exam completed today. Reviewed interval history, current medical issues, medications, allergies, and preventive care needs. Addressed all patient questions and concerns. Discussed lifestyle factors including diet, exercise, sleep, and stress management. Reviewed recommended age-appropriate screenings, labs, and vaccinations. Counseling provided on healthy habits and routine health maintenance. Follow-up as indicated based on findings and results. - Patient notes a history of significant prostate cancer in his family, we will order PSA today given issues.  Hypothyroidism - TSH ordered.     Khayree Delellis A. Vita MD [facility]

## 2023-10-31 NOTE — Patient Instructions (Addendum)
 Please check out luminosity for the brain activity games  VISIT SUMMARY:  During your visit, we discussed your concerns about falls, memory issues, and other health conditions. We reviewed your history of low sodium and potassium levels, allergy symptoms, and sleep apnea. We also addressed your concerns about potential dementia and your current management of Crohn's disease.  YOUR PLAN:  -FALLS: Your falls are likely due to low blood pressure when you stand up and balance issues. To help with this, increase your intake of Gatorade to maintain your potassium and sodium levels. We will also refill your sodium tablets, but you should only use them if Gatorade does not resolve your symptoms. If balance issues continue, we may consider physical therapy.  -HYPONATREMIA: Hyponatremia means low sodium levels in your blood. We will refill your sodium tablets and monitor your sodium levels as you increase your Gatorade intake.  -ALLERGIC RHINITIS WITH CHRONIC SINUS DRAINAGE: Allergic rhinitis is inflammation of the inside of your nose caused by allergens. You should continue taking Claritin daily and start using Flonase nasal spray, one spray in each nostril daily for at least three weeks and continue throughout allergy season.  -CROHN'S DISEASE WITH SKIN MANIFESTATIONS: Crohn's disease is a chronic inflammatory condition of the gastrointestinal tract, and it can sometimes cause skin changes. We will continue to monitor your condition.  -OBSTRUCTIVE SLEEP APNEA WITH INSPIRE IMPLANT: Obstructive sleep apnea is a condition where your breathing stops and starts during sleep. Your condition is managed with the Inspire implant, and your recent adjustment to your sleep schedule seems to be helping.  -AGE-RELATED COGNITIVE CHANGES: Your memory and cognitive concerns are likely related to aging and not dementia. To help improve your short-term memory, we recommend using memory games or apps like  Neuriva.  INSTRUCTIONS:  Please follow up with us  if you continue to experience falls or if your symptoms do not improve. We will monitor your sodium levels and adjust your treatment as needed. If you have any new or worsening symptoms, contact us  immediately.

## 2023-11-01 ENCOUNTER — Ambulatory Visit: Payer: Self-pay | Admitting: Family Medicine

## 2023-11-03 ENCOUNTER — Other Ambulatory Visit: Payer: Self-pay | Admitting: Family Medicine

## 2023-11-03 DIAGNOSIS — E038 Other specified hypothyroidism: Secondary | ICD-10-CM

## 2023-11-03 LAB — COMPREHENSIVE METABOLIC PANEL WITH GFR
ALT: 23 IU/L (ref 0–44)
AST: 20 IU/L (ref 0–40)
Albumin: 4.2 g/dL (ref 3.9–4.9)
Alkaline Phosphatase: 57 IU/L (ref 47–123)
BUN/Creatinine Ratio: 16 (ref 10–24)
BUN: 18 mg/dL (ref 8–27)
Bilirubin Total: 0.6 mg/dL (ref 0.0–1.2)
CO2: 22 mmol/L (ref 20–29)
Calcium: 9.3 mg/dL (ref 8.6–10.2)
Chloride: 98 mmol/L (ref 96–106)
Creatinine, Ser: 1.1 mg/dL (ref 0.76–1.27)
Globulin, Total: 2.2 g/dL (ref 1.5–4.5)
Glucose: 84 mg/dL (ref 70–99)
Potassium: 3.9 mmol/L (ref 3.5–5.2)
Sodium: 138 mmol/L (ref 134–144)
Total Protein: 6.4 g/dL (ref 6.0–8.5)
eGFR: 76 mL/min/1.73 (ref >59)

## 2023-11-03 LAB — LIPID PANEL
Chol/HDL Ratio: 2.5 ratio (ref 0.0–5.0)
Cholesterol, Total: 183 mg/dL (ref 100–199)
HDL: 73 mg/dL (ref >39)
LDL Chol Calc (NIH): 95 mg/dL (ref 0–99)
Triglycerides: 84 mg/dL (ref 0–149)
VLDL Cholesterol Cal: 15 mg/dL (ref 5–40)

## 2023-11-03 LAB — TSH: TSH: 2.09 u[IU]/mL (ref 0.450–4.500)

## 2023-11-03 LAB — CBC WITH DIFFERENTIAL/PLATELET
Basophils Absolute: 0.1 x10E3/uL (ref 0.0–0.2)
Basos: 1 %
EOS (ABSOLUTE): 0.1 x10E3/uL (ref 0.0–0.4)
Eos: 1 %
Hematocrit: 44.7 % (ref 37.5–51.0)
Hemoglobin: 14.8 g/dL (ref 13.0–17.7)
Immature Grans (Abs): 0 x10E3/uL (ref 0.0–0.1)
Immature Granulocytes: 0 %
Lymphocytes Absolute: 2.5 x10E3/uL (ref 0.7–3.1)
Lymphs: 27 %
MCH: 32.2 pg (ref 26.6–33.0)
MCHC: 33.1 g/dL (ref 31.5–35.7)
MCV: 97 fL (ref 79–97)
Monocytes Absolute: 0.8 x10E3/uL (ref 0.1–0.9)
Monocytes: 9 %
Neutrophils Absolute: 5.7 x10E3/uL (ref 1.4–7.0)
Neutrophils: 62 %
Platelets: 303 x10E3/uL (ref 150–450)
RBC: 4.59 x10E6/uL (ref 4.14–5.80)
RDW: 12.7 % (ref 11.6–15.4)
WBC: 9.2 x10E3/uL (ref 3.4–10.8)

## 2023-11-03 LAB — RPR+HIV+GC+CT PANEL
HIV Screen 4th Generation wRfx: NONREACTIVE
RPR Ser Ql: NONREACTIVE

## 2023-11-03 LAB — PSA: Prostate Specific Ag, Serum: 0.5 ng/mL (ref 0.0–4.0)

## 2023-11-07 ENCOUNTER — Ambulatory Visit: Admitting: Podiatry

## 2023-11-24 ENCOUNTER — Other Ambulatory Visit: Payer: Self-pay | Admitting: Family Medicine

## 2023-11-24 ENCOUNTER — Other Ambulatory Visit: Payer: Self-pay | Admitting: Psychiatry

## 2023-11-24 DIAGNOSIS — F422 Mixed obsessional thoughts and acts: Secondary | ICD-10-CM

## 2023-11-24 DIAGNOSIS — I10 Essential (primary) hypertension: Secondary | ICD-10-CM

## 2023-11-24 DIAGNOSIS — F4010 Social phobia, unspecified: Secondary | ICD-10-CM

## 2023-11-24 DIAGNOSIS — F331 Major depressive disorder, recurrent, moderate: Secondary | ICD-10-CM

## 2023-11-24 NOTE — Telephone Encounter (Signed)
 Please call to schedule FU. Has had a no show and a no show/cancel.

## 2023-11-25 NOTE — Telephone Encounter (Signed)
 LVM for pt to call back to schedule appt

## 2023-12-10 ENCOUNTER — Ambulatory Visit: Admitting: Gastroenterology

## 2023-12-11 ENCOUNTER — Other Ambulatory Visit: Payer: Self-pay | Admitting: Psychiatry

## 2023-12-11 DIAGNOSIS — F331 Major depressive disorder, recurrent, moderate: Secondary | ICD-10-CM

## 2023-12-11 DIAGNOSIS — F4010 Social phobia, unspecified: Secondary | ICD-10-CM

## 2023-12-11 DIAGNOSIS — F422 Mixed obsessional thoughts and acts: Secondary | ICD-10-CM

## 2023-12-13 ENCOUNTER — Telehealth: Payer: Self-pay

## 2023-12-13 NOTE — Telephone Encounter (Signed)
 Pharmacy requesting RX authorization for Losartan  Potassium 100 mg tabs to [facility] [address] [place]

## 2023-12-16 ENCOUNTER — Ambulatory Visit: Admitting: Family Medicine

## 2023-12-19 NOTE — Telephone Encounter (Signed)
 Patient states he is okay & no longer needs the potassium or losartan 

## 2023-12-20 ENCOUNTER — Encounter: Payer: Self-pay | Admitting: Family Medicine

## 2023-12-31 ENCOUNTER — Other Ambulatory Visit: Payer: Self-pay | Admitting: Gastroenterology

## 2024-01-14 ENCOUNTER — Encounter: Payer: Self-pay | Admitting: Psychiatry

## 2024-01-14 ENCOUNTER — Ambulatory Visit: Admitting: Psychiatry

## 2024-01-14 DIAGNOSIS — F5105 Insomnia due to other mental disorder: Secondary | ICD-10-CM | POA: Diagnosis not present

## 2024-01-14 DIAGNOSIS — F331 Major depressive disorder, recurrent, moderate: Secondary | ICD-10-CM | POA: Diagnosis not present

## 2024-01-14 DIAGNOSIS — E871 Hypo-osmolality and hyponatremia: Secondary | ICD-10-CM

## 2024-01-14 DIAGNOSIS — G4733 Obstructive sleep apnea (adult) (pediatric): Secondary | ICD-10-CM | POA: Diagnosis not present

## 2024-01-14 DIAGNOSIS — F422 Mixed obsessional thoughts and acts: Secondary | ICD-10-CM

## 2024-01-14 DIAGNOSIS — F423 Hoarding disorder: Secondary | ICD-10-CM

## 2024-01-14 DIAGNOSIS — F4010 Social phobia, unspecified: Secondary | ICD-10-CM | POA: Diagnosis not present

## 2024-01-14 DIAGNOSIS — G3184 Mild cognitive impairment, so stated: Secondary | ICD-10-CM | POA: Diagnosis not present

## 2024-01-14 MED ORDER — LAMOTRIGINE 200 MG PO TABS: 200.0000 mg | ORAL_TABLET | Freq: Every day | ORAL | 1 refills | Status: AC

## 2024-01-14 MED ORDER — DONEPEZIL HCL 10 MG PO TABS: 10.0000 mg | ORAL_TABLET | Freq: Every day | ORAL | 3 refills | Status: AC

## 2024-01-14 MED ORDER — BUSPIRONE HCL 30 MG PO TABS: 30.0000 mg | ORAL_TABLET | Freq: Two times a day (BID) | ORAL | 1 refills | Status: AC

## 2024-01-14 MED ORDER — OLANZAPINE 15 MG PO TABS: 15.0000 mg | ORAL_TABLET | Freq: Every day | ORAL | 0 refills | Status: AC

## 2024-01-14 MED ORDER — FLUVOXAMINE MALEATE 100 MG PO TABS: 400.0000 mg | ORAL_TABLET | Freq: Every day | ORAL | 1 refills | Status: AC

## 2024-01-14 NOTE — Progress Notes (Signed)
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 crime.  There is very little evidence to support this.  He has been under my psychiatric care for many many years and never owed noticed body odor and appointments at any time.  He also suspects that people are thinking negatively of him regularly.  Overall his paranoia and anxiety and depression are unchanged with reduction in olanzapine  to 15mg  daily .   Less distressed and depressed than previous visit in April 2024  Cognitve complaints could be related to untreated OSA. Disc risk factor for dementia.  He doesn't think he tolerates CPAP.   He thinks he was told he didn't have to do it but I suspect this is causing sx. 07/01/21 sleep study: [facility] 41 The Inspire device has helped some.  Hx hyponatremia. Normal 09/2021  Discussed potential metabolic side effects associated with atypical antipsychotics, as well as potential risk for movement side effects. Advised pt to contact office if movement side effects occur.  No evidence of movement disorder. Still  obsessing on farting and thinking others notice but it's never  been noticeable in years of seeing him and he never gets complaints from other about it.  Discussed the risk of polypharmacy but again if appears medically necessary, helpful and well tolerated.  Counseling around unrealistic fears.  Has chronic unrealistic anxiety  See PCP about enuresis.  He hasn't done so.  No med changes. buspirone  30 mg twice daily, , fluvoxamine  400 mg daily, lamotrigine  200 mg daily,  olanzapine  15 mg night Aricept  10 mg daily.  Stopped counseling.  FU 3-4 mos  Lorene Macintosh, MD, DFAPA  Please see After Visit Summary for patient specific instructions.  Future Appointments  Date Time Provider Department Center  01/21/2024  8:30 AM Zehr, Harlene JONETTA RIGGERS [facility]  01/21/2024 10:45 AM Christine Rush, DPM [facility]  04/13/2024  4:30 PM Cottle, Lorene KANDICE Raddle., MD [facility] None  11/02/2024  8:15 AM Vita Morrow, MD [facility] [phone] [place]    No orders of the defined types were placed in this encounter.       -------------------------------

## 2024-01-21 ENCOUNTER — Other Ambulatory Visit

## 2024-01-21 ENCOUNTER — Ambulatory Visit: Admitting: Gastroenterology

## 2024-01-21 ENCOUNTER — Ambulatory Visit: Admitting: Podiatry

## 2024-01-21 ENCOUNTER — Encounter: Payer: Self-pay | Admitting: Gastroenterology

## 2024-01-21 VITALS — BP 130/84 | HR 67 | Ht 74.0 in | Wt 231.6 lb

## 2024-01-21 DIAGNOSIS — K8689 Other specified diseases of pancreas: Secondary | ICD-10-CM

## 2024-01-21 DIAGNOSIS — B351 Tinea unguium: Secondary | ICD-10-CM | POA: Diagnosis not present

## 2024-01-21 DIAGNOSIS — R143 Flatulence: Secondary | ICD-10-CM | POA: Diagnosis not present

## 2024-01-21 DIAGNOSIS — K5080 Crohn's disease of both small and large intestine without complications: Secondary | ICD-10-CM

## 2024-01-21 DIAGNOSIS — M79672 Pain in left foot: Secondary | ICD-10-CM

## 2024-01-21 DIAGNOSIS — R159 Full incontinence of feces: Secondary | ICD-10-CM | POA: Diagnosis not present

## 2024-01-21 DIAGNOSIS — M79671 Pain in right foot: Secondary | ICD-10-CM | POA: Diagnosis not present

## 2024-01-21 DIAGNOSIS — K8681 Exocrine pancreatic insufficiency: Secondary | ICD-10-CM | POA: Diagnosis not present

## 2024-01-21 DIAGNOSIS — R197 Diarrhea, unspecified: Secondary | ICD-10-CM

## 2024-01-21 NOTE — Progress Notes (Unsigned)
 "    01/21/2024 Zachary [name] [id number] 02-Jan-1960   HISTORY OF PRESENT ILLNESS:  Colonoscopy 01/2020:   - A few erosions in the terminal ileum. Biopsied. - Two ulcers at the ileocecal valve. Biopsied. - Sessile sigmoid colon polyp. Removed and retrieved. - The examination was otherwise normal on direct and retroflexion views. Biopsied.   1. Surgical [P], small bowel, terminal ileum - MILDLY ACTIVE CHRONIC COLITIS. - NEGATIVE FOR GRANULOMATA AND DYSPLASIA. 2. Surgical [P], small bowel, ileocecal valve - MILDLY ACTIVE CHRONIC COLITIS. - NEGATIVE FOR GRANULOMATA AND DYSPLASIA. 3. Surgical [P], random normal colon - COLONIC MUCOSA WITH NO SIGNIFICANT PATHOLOGIC FINDINGS. - NEGATIVE FOR ACTIVE INFLAMMATION AND OTHER ABNORMALITIES. 4. Surgical [P], colon, sigmoid, polyp - HYPERPLASTIC POLYP.   Past Medical History:  Diagnosis Date   Anxiety    Crohn disease (HCC)    pt reports subsequent testing was normal   Depression    GERD (gastroesophageal reflux disease)    Herpes simplex    Hoarding disorder    Hyperlipidemia    Hypertension    IBS (irritable bowel syndrome)    Incontinence    Past Surgical History:  Procedure Laterality Date   APPENDECTOMY     COLONOSCOPY     POLYPECTOMY     TWISTED TESTICLES  1970   [facility]    UMBILICAL HERNIA REPAIR      reports that he quit smoking about 46 years ago. His smoking use included cigarettes. He has never used smokeless tobacco. He reports that he does not drink alcohol and does not use drugs. family history includes Arthritis in his mother; Breast cancer in his maternal grandmother; Cancer in his maternal grandfather and paternal grandmother; Depression in his mother; Hypertension in his father; Lung cancer in his maternal aunt; Prostate cancer in his paternal uncle and paternal uncle; Prostate cancer (age of onset: 65) in his father; Stroke in his mother. Allergies[1]    Outpatient Encounter Medications as of 01/21/2024   Medication Sig   aspirin  81 MG EC tablet Take 81 mg by mouth daily. Swallow whole.   budesonide  (ENTOCORT EC ) 3 MG 24 hr capsule TAKE 3 CAPSULES (9MG  TOTAL) BY MOUTH DAILY   CREON  36000-114000 units CPEP capsule TAKE 4 CAPSULES BY MOUTH BEFORE A MEAL AND 2 CAPSULES BY MOUTH WITH EACH SNACK *MAX 18 CAPSULES PER DAY   fluticasone  (FLONASE ) 50 MCG/ACT nasal spray Place 2 sprays into both nostrils daily.   levothyroxine  (SYNTHROID ) 75 MCG tablet TAKE 1 TABLET BY MOUTH DAILY   loperamide  (IMODIUM ) 2 MG capsule Take 1 capsule (2 mg total) by mouth every 4 (four) hours. As needed   loratadine  (CLARITIN ) 10 MG tablet Take 1 tablet (10 mg total) by mouth daily.   losartan  (COZAAR ) 100 MG tablet TAKE 1 TABLET BY MOUTH DAILY   psyllium (REGULOID) 0.52 g capsule Take 0.52 g by mouth daily.   sodium chloride  1 g tablet Take 1 tablet (1 g total) by mouth daily.   busPIRone  (BUSPAR ) 30 MG tablet Take 1 tablet (30 mg total) by mouth 2 (two) times daily.   donepezil  (ARICEPT ) 10 MG tablet Take 1 tablet (10 mg total) by mouth at bedtime.   fluvoxaMINE  (LUVOX ) 100 MG tablet Take 4 tablets (400 mg total) by mouth at bedtime.   lamoTRIgine  (LAMICTAL ) 200 MG tablet Take 1 tablet (200 mg total) by mouth daily.   OLANZapine  (ZYPREXA ) 15 MG tablet Take 1 tablet (15 mg total) by mouth at bedtime.   No facility-administered encounter  medications on file as of 01/21/2024.     REVIEW OF SYSTEMS  : All other systems reviewed and negative except where noted in the History of Present Illness.   PHYSICAL EXAM: BP 130/84   Pulse 67   Ht 6' 2 (1.88 m)   Wt 231 lb 9.6 oz (105.1 kg)   BMI 29.74 kg/m  General: Well developed white male in no acute distress Head: Normocephalic and atraumatic Eyes:  sclerae anicteric,conjunctive pink. Ears: Normal auditory acuity Neck: Supple, no masses.  Lungs: Clear throughout to auscultation Heart: Regular rate and rhythm Abdomen: Soft, nontender, non distended. No masses or  hepatomegaly noted. Normal bowel sounds Rectal: *** Musculoskeletal: Symmetrical with no gross deformities  Skin: No lesions on visible extremities Extremities: No edema  Neurological: Alert oriented x 4, grossly nonfocal Cervical Nodes:  No significant cervical adenopathy Psychological:  Alert and cooperative. Normal mood and affect  Assessment & Plan Crohn's ileocolitis Chronic, variably active Crohn's ileocolitis with intermittent abdominal pain and urgency, managed with budesonide . Ongoing symptoms may reflect active disease or overlap with other gastrointestinal disorders. - Continued budesonide  9 mg daily. - Ordered fecal calprotectin to assess intestinal inflammation and monitor disease control. - Reviewed dietary modifications, including a trial of lactose-free diet for several weeks.  Pancreatic exocrine insufficiency Chronic pancreatic exocrine insufficiency managed with pancreatic enzyme replacement. Persistent flatulence may be multifactorial, including incomplete enzyme replacement, Crohn's disease, or other etiologies. - Continued Creon  36,000-114,000 units before meals and with snacks. - Reinforced importance of consistent enzyme replacement for symptom control.  Fecal incontinence Chronic fecal incontinence with urgency and occasional leakage, impacting quality of life. Previous pelvic floor physical therapy with persistent symptoms. - Referred for repeat pelvic floor physical therapy at prior location. - Discussed and confirmed daily use of fiber supplementation (psyllium). - Advised on hygiene with wipes; recommended water-based wipes if irritation develops.  Chronic flatulence Severe, persistent, and socially distressing flatulence, likely multifactorial due to Crohn's disease, pancreatic insufficiency, dietary factors, and possible small intestinal bacterial overgrowth. - Provided Ibogard (peppermint oil extract) samples for symptomatic relief. - Recommended  over-the-counter agents such as simethicone and Beano for gas management. - Advised strict lactose-free diet trial for 2-3 weeks. - Documented gas management recommendations in patient paperwork.  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 Diagnosis requires breath testing. - Ordered home breath test kit for SIBO diagnosis. Instructed on dietary restrictions prior to testing and kit return for processing.    CC:  Joyce Norleen BROCKS, MD       [1]  Allergies Allergen Reactions   Erythromycin Other (See Comments)    Patient said he is allergic, but the reaction was not cited   Penicillins Other (See Comments)    It may kill me   Tetracyclines & Related Hives   "

## 2024-01-21 NOTE — Patient Instructions (Addendum)
 Your provider has requested that you go to the basement level for lab work before leaving today. Press B on the elevator. The lab is located at the first door on the left as you exit the elevator.   You have been given a testing kit to check for small intestine bacterial overgrowth (SIBO) which is completed by a company named [facility]. Make sure to return your test in the mail using the return mailing label given to you along with the kit. The test order, your demographic and insurance information have all already been sent to the company. [facility] will collect an upfront charge of $109.00 for commercial insurance plans and $229.00 if you are paying cash. The potential remaining total after claim submission and review is $120.00. Make sure to discuss with [facility] PRIOR to having the test to see if they have gotten information from your insurance company as to how much your testing will cost out of pocket, if any. Please contact [facility] at phone number [phone] to get instructions regarding how to perform the test as our office is unable to give specific testing instructions.   Ibgard samples provided use as directed per box instructions.   Could also try Gas-X, Simethicone.   Referral placed for Pelvic Floor PT.

## 2024-01-21 NOTE — Progress Notes (Signed)
 Patient presents for evaluation and treatment of tenderness and some redness around nails feet.  Tenderness around toes with walking and wearing shoes.  Physical exam:  General appearance: Alert, pleasant, and in no acute distress.  Vascular: Pedal pulses: DP 2/4 B/L, PT 2/4 B/L.  Mild to moderate edema lower legs bilaterally.  Capillary refill time immediate bilaterally  Neurologic:  Dermatologic:  Nails thickened, disfigured, discolored 1-5 BL with subungual debris.  Redness and hypertrophic nail folds along nail folds bilaterally but no signs of drainage or infection.  Musculoskeletal:     Diagnosis: 1. Painful onychomycotic nails 1 through 5 bilaterally. 2. Pain toes 1 through 5 bilaterally.  Plan: -Debrided onychomycotic nails 1 through 5 bilaterally.  Sharply debrided nails with nail clipper and reduced with a power bur.  Return 3 months RFC

## 2024-01-22 ENCOUNTER — Encounter: Payer: Self-pay | Admitting: Gastroenterology

## 2024-01-22 NOTE — Telephone Encounter (Signed)
Pt had appt 1/13

## 2024-01-23 NOTE — Progress Notes (Signed)
"   Attending Physician's Attestation   I have reviewed the chart.   I agree with the Advanced Practitioner's note, impression, and recommendations with any updates as below. Agree with additional workup as outlined.  In the long-term, budesonide  daily use lifelong does not make the greatest sense due to longer-term steroid effects and so the question is with this patient benefit from biologic therapy being considered in the future.  Reasonable to rule out SIBO and continue to treat EPI and may consider increasing pancreas enzyme replacement therapy for further weight-based dosing in future.  If fecal calprotectin is elevated, likely need repeat colonoscopy to further evaluate disease activity.   Aloha Finner, MD [facility] Advanced [facility]oscopy Office # [phone]  "

## 2024-04-13 ENCOUNTER — Ambulatory Visit: Admitting: Psychiatry

## 2024-04-20 ENCOUNTER — Ambulatory Visit: Admitting: Podiatry

## 2024-11-02 ENCOUNTER — Encounter: Admitting: Family Medicine
